# Patient Record
Sex: Male | Born: 1950
Health system: Southern US, Community
[De-identification: ages and names within clinical notes are randomized; demographics above are authoritative.]

## PROBLEM LIST (undated history)

## (undated) DIAGNOSIS — M419 Scoliosis, unspecified: Secondary | ICD-10-CM

## (undated) DIAGNOSIS — R7303 Prediabetes: Secondary | ICD-10-CM

## (undated) DIAGNOSIS — I10 Essential (primary) hypertension: Secondary | ICD-10-CM

## (undated) DIAGNOSIS — I219 Acute myocardial infarction, unspecified: Secondary | ICD-10-CM

## (undated) DIAGNOSIS — K635 Polyp of colon: Secondary | ICD-10-CM

## (undated) DIAGNOSIS — E785 Hyperlipidemia, unspecified: Secondary | ICD-10-CM

## (undated) DIAGNOSIS — M1990 Unspecified osteoarthritis, unspecified site: Secondary | ICD-10-CM

## (undated) DIAGNOSIS — K219 Gastro-esophageal reflux disease without esophagitis: Secondary | ICD-10-CM

## (undated) DIAGNOSIS — I2510 Atherosclerotic heart disease of native coronary artery without angina pectoris: Secondary | ICD-10-CM

## (undated) DIAGNOSIS — K625 Hemorrhage of anus and rectum: Secondary | ICD-10-CM

## (undated) HISTORY — DX: Polyp of colon: K63.5

## (undated) HISTORY — DX: Prediabetes: R73.03

## (undated) HISTORY — DX: Essential (primary) hypertension: I10

## (undated) HISTORY — DX: Hyperlipidemia, unspecified: E78.5

## (undated) HISTORY — PX: CARDIAC CATHETERIZATION: SHX172

## (undated) HISTORY — DX: Scoliosis, unspecified: M41.9

## (undated) HISTORY — DX: Hemorrhage of anus and rectum: K62.5

## (undated) HISTORY — DX: Acute myocardial infarction, unspecified: I21.9

## (undated) HISTORY — DX: Unspecified osteoarthritis, unspecified site: M19.90

## (undated) HISTORY — PX: COLONOSCOPY: SHX174

---

## 1988-01-04 HISTORY — PX: OTHER SURGICAL HISTORY: SHX169

## 1997-10-26 ENCOUNTER — Emergency Department (HOSPITAL_COMMUNITY): Admission: EM | Admit: 1997-10-26 | Discharge: 1997-10-26 | Payer: Self-pay | Admitting: Emergency Medicine

## 1998-08-28 ENCOUNTER — Emergency Department (HOSPITAL_COMMUNITY): Admission: EM | Admit: 1998-08-28 | Discharge: 1998-08-28 | Payer: Self-pay | Admitting: Emergency Medicine

## 1998-09-27 ENCOUNTER — Emergency Department (HOSPITAL_COMMUNITY): Admission: EM | Admit: 1998-09-27 | Discharge: 1998-09-27 | Payer: Self-pay | Admitting: Emergency Medicine

## 1999-07-05 ENCOUNTER — Emergency Department (HOSPITAL_COMMUNITY): Admission: EM | Admit: 1999-07-05 | Discharge: 1999-07-05 | Payer: Self-pay | Admitting: Emergency Medicine

## 1999-07-05 ENCOUNTER — Encounter: Payer: Self-pay | Admitting: Emergency Medicine

## 1999-09-02 ENCOUNTER — Encounter: Admission: RE | Admit: 1999-09-02 | Discharge: 1999-12-01 | Payer: Self-pay | Admitting: Family Medicine

## 1999-11-02 ENCOUNTER — Encounter: Admission: RE | Admit: 1999-11-02 | Discharge: 1999-11-02 | Payer: Self-pay | Admitting: Internal Medicine

## 2001-12-20 ENCOUNTER — Encounter: Payer: Self-pay | Admitting: Emergency Medicine

## 2001-12-20 ENCOUNTER — Emergency Department (HOSPITAL_COMMUNITY): Admission: EM | Admit: 2001-12-20 | Discharge: 2001-12-20 | Payer: Self-pay | Admitting: Emergency Medicine

## 2002-04-01 ENCOUNTER — Encounter: Payer: Self-pay | Admitting: Internal Medicine

## 2002-04-01 ENCOUNTER — Encounter: Admission: RE | Admit: 2002-04-01 | Discharge: 2002-04-01 | Payer: Self-pay | Admitting: Internal Medicine

## 2002-04-01 ENCOUNTER — Ambulatory Visit (HOSPITAL_COMMUNITY): Admission: RE | Admit: 2002-04-01 | Discharge: 2002-04-01 | Payer: Self-pay | Admitting: Internal Medicine

## 2002-04-02 ENCOUNTER — Encounter: Payer: Self-pay | Admitting: Emergency Medicine

## 2002-04-02 ENCOUNTER — Emergency Department (HOSPITAL_COMMUNITY): Admission: EM | Admit: 2002-04-02 | Discharge: 2002-04-02 | Payer: Self-pay | Admitting: Emergency Medicine

## 2003-04-03 ENCOUNTER — Encounter: Admission: RE | Admit: 2003-04-03 | Discharge: 2003-04-03 | Payer: Self-pay | Admitting: Internal Medicine

## 2003-04-11 ENCOUNTER — Encounter: Admission: RE | Admit: 2003-04-11 | Discharge: 2003-04-11 | Payer: Self-pay | Admitting: Internal Medicine

## 2003-06-03 ENCOUNTER — Encounter: Admission: RE | Admit: 2003-06-03 | Discharge: 2003-09-01 | Payer: Self-pay | Admitting: Internal Medicine

## 2003-10-01 ENCOUNTER — Emergency Department (HOSPITAL_COMMUNITY): Admission: EM | Admit: 2003-10-01 | Discharge: 2003-10-01 | Payer: Self-pay | Admitting: Emergency Medicine

## 2003-10-28 ENCOUNTER — Ambulatory Visit: Payer: Self-pay | Admitting: Internal Medicine

## 2004-01-24 ENCOUNTER — Emergency Department (HOSPITAL_COMMUNITY): Admission: EM | Admit: 2004-01-24 | Discharge: 2004-01-24 | Payer: Self-pay | Admitting: Emergency Medicine

## 2004-02-16 ENCOUNTER — Ambulatory Visit: Payer: Self-pay | Admitting: Internal Medicine

## 2004-03-19 ENCOUNTER — Ambulatory Visit (HOSPITAL_COMMUNITY): Admission: RE | Admit: 2004-03-19 | Discharge: 2004-03-19 | Payer: Self-pay | Admitting: *Deleted

## 2004-03-19 ENCOUNTER — Encounter (INDEPENDENT_AMBULATORY_CARE_PROVIDER_SITE_OTHER): Payer: Self-pay | Admitting: *Deleted

## 2005-03-16 ENCOUNTER — Ambulatory Visit: Payer: Self-pay | Admitting: Internal Medicine

## 2005-07-05 ENCOUNTER — Emergency Department (HOSPITAL_COMMUNITY): Admission: EM | Admit: 2005-07-05 | Discharge: 2005-07-05 | Payer: Self-pay | Admitting: Emergency Medicine

## 2006-01-05 DIAGNOSIS — E785 Hyperlipidemia, unspecified: Secondary | ICD-10-CM | POA: Insufficient documentation

## 2006-01-05 DIAGNOSIS — D126 Benign neoplasm of colon, unspecified: Secondary | ICD-10-CM | POA: Insufficient documentation

## 2006-02-17 ENCOUNTER — Telehealth (INDEPENDENT_AMBULATORY_CARE_PROVIDER_SITE_OTHER): Payer: Self-pay | Admitting: Internal Medicine

## 2006-08-22 ENCOUNTER — Emergency Department (HOSPITAL_COMMUNITY): Admission: EM | Admit: 2006-08-22 | Discharge: 2006-08-22 | Payer: Self-pay | Admitting: Emergency Medicine

## 2006-08-28 ENCOUNTER — Ambulatory Visit: Payer: Self-pay | Admitting: Internal Medicine

## 2006-09-20 ENCOUNTER — Ambulatory Visit: Payer: Self-pay | Admitting: Internal Medicine

## 2006-09-20 ENCOUNTER — Encounter (INDEPENDENT_AMBULATORY_CARE_PROVIDER_SITE_OTHER): Payer: Self-pay | Admitting: Internal Medicine

## 2006-09-20 LAB — CONVERTED CEMR LAB
AST: 14 units/L (ref 0–37)
Alkaline Phosphatase: 81 units/L (ref 39–117)
BUN: 16 mg/dL (ref 6–23)
Creatinine, Ser: 1.29 mg/dL (ref 0.40–1.50)
Glucose, Bld: 87 mg/dL (ref 70–99)
HCT: 43.2 % (ref 39.0–52.0)
Hemoglobin: 15 g/dL (ref 13.0–17.0)
MCHC: 34.7 g/dL (ref 30.0–36.0)
MCV: 83.4 fL (ref 78.0–100.0)
RDW: 13.2 % (ref 11.5–14.0)

## 2006-12-15 ENCOUNTER — Encounter (INDEPENDENT_AMBULATORY_CARE_PROVIDER_SITE_OTHER): Payer: Self-pay | Admitting: *Deleted

## 2006-12-15 ENCOUNTER — Ambulatory Visit: Payer: Self-pay | Admitting: Internal Medicine

## 2006-12-18 LAB — CONVERTED CEMR LAB
ALT: 14 units/L (ref 0–53)
Albumin: 4.6 g/dL (ref 3.5–5.2)
BUN: 14 mg/dL (ref 6–23)
CO2: 27 meq/L (ref 19–32)
Calcium: 9.7 mg/dL (ref 8.4–10.5)
Chloride: 106 meq/L (ref 96–112)
Cholesterol: 233 mg/dL — ABNORMAL HIGH (ref 0–200)
Creatinine, Ser: 1.3 mg/dL (ref 0.40–1.50)
HDL: 42 mg/dL (ref >39)
PSA: 0.66 ng/mL (ref 0.10–4.00)
Potassium: 4.4 meq/L (ref 3.5–5.3)
Total CHOL/HDL Ratio: 5.5

## 2007-01-06 ENCOUNTER — Telehealth (INDEPENDENT_AMBULATORY_CARE_PROVIDER_SITE_OTHER): Payer: Self-pay | Admitting: *Deleted

## 2007-01-08 ENCOUNTER — Ambulatory Visit: Payer: Self-pay | Admitting: Internal Medicine

## 2007-02-19 ENCOUNTER — Encounter (INDEPENDENT_AMBULATORY_CARE_PROVIDER_SITE_OTHER): Payer: Self-pay | Admitting: *Deleted

## 2007-02-19 ENCOUNTER — Ambulatory Visit: Payer: Self-pay | Admitting: Hospitalist

## 2007-02-23 LAB — CONVERTED CEMR LAB
ALT: 14 U/L
AST: 13 U/L
Albumin: 4.7 g/dL
Alkaline Phosphatase: 79 U/L
BUN: 12 mg/dL
CO2: 27 meq/L
Calcium: 10.2 mg/dL
Chloride: 106 meq/L
Cholesterol: 154 mg/dL
Creatinine, Ser: 1.19 mg/dL
Glucose, Bld: 110 mg/dL — ABNORMAL HIGH
HDL: 36 mg/dL — ABNORMAL LOW
LDL Cholesterol: 107 mg/dL — ABNORMAL HIGH
Potassium: 4.7 meq/L
Sodium: 143 meq/L
Total Bilirubin: 0.8 mg/dL
Total CHOL/HDL Ratio: 4.3
Total Protein: 7.8 g/dL
Triglycerides: 54 mg/dL
VLDL: 11 mg/dL

## 2007-02-27 ENCOUNTER — Ambulatory Visit: Payer: Self-pay | Admitting: Internal Medicine

## 2007-02-27 ENCOUNTER — Encounter (INDEPENDENT_AMBULATORY_CARE_PROVIDER_SITE_OTHER): Payer: Self-pay | Admitting: *Deleted

## 2007-02-28 ENCOUNTER — Telehealth (INDEPENDENT_AMBULATORY_CARE_PROVIDER_SITE_OTHER): Payer: Self-pay | Admitting: *Deleted

## 2007-03-01 ENCOUNTER — Telehealth: Payer: Self-pay | Admitting: *Deleted

## 2007-03-01 ENCOUNTER — Telehealth (INDEPENDENT_AMBULATORY_CARE_PROVIDER_SITE_OTHER): Payer: Self-pay | Admitting: *Deleted

## 2007-03-06 ENCOUNTER — Telehealth (INDEPENDENT_AMBULATORY_CARE_PROVIDER_SITE_OTHER): Payer: Self-pay | Admitting: *Deleted

## 2007-03-13 ENCOUNTER — Telehealth (INDEPENDENT_AMBULATORY_CARE_PROVIDER_SITE_OTHER): Payer: Self-pay | Admitting: *Deleted

## 2007-03-19 ENCOUNTER — Telehealth (INDEPENDENT_AMBULATORY_CARE_PROVIDER_SITE_OTHER): Payer: Self-pay | Admitting: *Deleted

## 2007-03-20 ENCOUNTER — Ambulatory Visit: Payer: Self-pay | Admitting: Infectious Diseases

## 2007-03-21 ENCOUNTER — Encounter (INDEPENDENT_AMBULATORY_CARE_PROVIDER_SITE_OTHER): Payer: Self-pay | Admitting: Internal Medicine

## 2007-03-21 LAB — CONVERTED CEMR LAB
BUN: 19 mg/dL (ref 6–23)
CO2: 26 meq/L (ref 19–32)
Chloride: 107 meq/L (ref 96–112)
Glucose, Bld: 88 mg/dL (ref 70–99)
Potassium: 4.2 meq/L (ref 3.5–5.3)
Sodium: 145 meq/L (ref 135–145)

## 2007-03-23 ENCOUNTER — Telehealth (INDEPENDENT_AMBULATORY_CARE_PROVIDER_SITE_OTHER): Payer: Self-pay | Admitting: *Deleted

## 2007-04-25 ENCOUNTER — Telehealth (INDEPENDENT_AMBULATORY_CARE_PROVIDER_SITE_OTHER): Payer: Self-pay | Admitting: *Deleted

## 2007-05-14 ENCOUNTER — Ambulatory Visit: Payer: Self-pay | Admitting: Internal Medicine

## 2007-06-08 ENCOUNTER — Telehealth (INDEPENDENT_AMBULATORY_CARE_PROVIDER_SITE_OTHER): Payer: Self-pay | Admitting: *Deleted

## 2007-06-08 ENCOUNTER — Ambulatory Visit: Payer: Self-pay | Admitting: Internal Medicine

## 2007-06-08 ENCOUNTER — Encounter (INDEPENDENT_AMBULATORY_CARE_PROVIDER_SITE_OTHER): Payer: Self-pay | Admitting: *Deleted

## 2007-06-08 LAB — CONVERTED CEMR LAB
Bilirubin Urine: NEGATIVE
Hemoglobin, Urine: NEGATIVE
Ketones, ur: NEGATIVE mg/dL
Leukocytes, UA: NEGATIVE
Protein, ur: NEGATIVE mg/dL
Urine Glucose: NEGATIVE mg/dL
pH: 5.5 (ref 5.0–8.0)

## 2007-07-11 ENCOUNTER — Telehealth (INDEPENDENT_AMBULATORY_CARE_PROVIDER_SITE_OTHER): Payer: Self-pay | Admitting: *Deleted

## 2007-07-23 ENCOUNTER — Ambulatory Visit: Payer: Self-pay | Admitting: *Deleted

## 2007-08-08 ENCOUNTER — Ambulatory Visit: Payer: Self-pay | Admitting: Gastroenterology

## 2007-08-16 ENCOUNTER — Telehealth: Payer: Self-pay | Admitting: Gastroenterology

## 2007-08-17 ENCOUNTER — Telehealth: Payer: Self-pay | Admitting: Gastroenterology

## 2007-08-22 ENCOUNTER — Ambulatory Visit (HOSPITAL_COMMUNITY): Admission: RE | Admit: 2007-08-22 | Discharge: 2007-08-22 | Payer: Self-pay | Admitting: Gastroenterology

## 2007-08-22 ENCOUNTER — Encounter: Payer: Self-pay | Admitting: Gastroenterology

## 2007-08-22 ENCOUNTER — Ambulatory Visit: Payer: Self-pay | Admitting: Gastroenterology

## 2007-08-23 ENCOUNTER — Encounter: Payer: Self-pay | Admitting: Gastroenterology

## 2007-08-26 ENCOUNTER — Telehealth (INDEPENDENT_AMBULATORY_CARE_PROVIDER_SITE_OTHER): Payer: Self-pay | Admitting: Internal Medicine

## 2007-08-27 ENCOUNTER — Encounter: Payer: Self-pay | Admitting: Gastroenterology

## 2007-08-30 ENCOUNTER — Ambulatory Visit: Payer: Self-pay | Admitting: Internal Medicine

## 2007-09-27 ENCOUNTER — Ambulatory Visit: Payer: Self-pay | Admitting: Internal Medicine

## 2007-11-09 ENCOUNTER — Telehealth (INDEPENDENT_AMBULATORY_CARE_PROVIDER_SITE_OTHER): Payer: Self-pay | Admitting: *Deleted

## 2007-11-16 ENCOUNTER — Ambulatory Visit: Payer: Self-pay | Admitting: Internal Medicine

## 2007-12-10 ENCOUNTER — Encounter (INDEPENDENT_AMBULATORY_CARE_PROVIDER_SITE_OTHER): Payer: Self-pay | Admitting: Adult Health

## 2007-12-10 ENCOUNTER — Ambulatory Visit: Payer: Self-pay | Admitting: Internal Medicine

## 2007-12-10 LAB — CONVERTED CEMR LAB
AST: 13 units/L (ref 0–37)
BUN: 15 mg/dL (ref 6–23)
Basophils Relative: 0 % (ref 0–1)
CO2: 25 meq/L (ref 19–32)
Chloride: 101 meq/L (ref 96–112)
Creatinine, Ser: 1.3 mg/dL (ref 0.40–1.50)
Hemoglobin: 15.3 g/dL (ref 13.0–17.0)
LDL Cholesterol: 134 mg/dL — ABNORMAL HIGH (ref 0–99)
MCHC: 34.5 g/dL (ref 30.0–36.0)
Monocytes Absolute: 0.3 10*3/uL (ref 0.1–1.0)
Monocytes Relative: 5 % (ref 3–12)
Neutro Abs: 2.4 10*3/uL (ref 1.7–7.7)
PSA: 0.58 ng/mL (ref 0.10–4.00)
RBC: 5.24 M/uL (ref 4.22–5.81)
Triglycerides: 79 mg/dL (ref <150)
VLDL: 16 mg/dL (ref 0–40)

## 2007-12-12 ENCOUNTER — Encounter (INDEPENDENT_AMBULATORY_CARE_PROVIDER_SITE_OTHER): Payer: Self-pay | Admitting: Adult Health

## 2007-12-12 LAB — CONVERTED CEMR LAB: Hgb A1c MFr Bld: 6 % (ref 4.6–6.1)

## 2007-12-21 ENCOUNTER — Telehealth (INDEPENDENT_AMBULATORY_CARE_PROVIDER_SITE_OTHER): Payer: Self-pay | Admitting: *Deleted

## 2008-01-22 ENCOUNTER — Ambulatory Visit: Payer: Self-pay | Admitting: Internal Medicine

## 2008-01-24 ENCOUNTER — Telehealth: Payer: Self-pay | Admitting: Infectious Disease

## 2008-02-16 ENCOUNTER — Telehealth (INDEPENDENT_AMBULATORY_CARE_PROVIDER_SITE_OTHER): Payer: Self-pay | Admitting: Internal Medicine

## 2008-02-28 ENCOUNTER — Ambulatory Visit: Payer: Self-pay | Admitting: Internal Medicine

## 2008-03-12 ENCOUNTER — Encounter (INDEPENDENT_AMBULATORY_CARE_PROVIDER_SITE_OTHER): Payer: Self-pay | Admitting: Internal Medicine

## 2008-03-12 ENCOUNTER — Telehealth (INDEPENDENT_AMBULATORY_CARE_PROVIDER_SITE_OTHER): Payer: Self-pay | Admitting: *Deleted

## 2008-03-12 ENCOUNTER — Ambulatory Visit: Payer: Self-pay | Admitting: *Deleted

## 2008-03-13 LAB — CONVERTED CEMR LAB
Albumin: 4.4 g/dL (ref 3.5–5.2)
Alkaline Phosphatase: 63 units/L (ref 39–117)
BUN: 13 mg/dL (ref 6–23)
Calcium: 9.7 mg/dL (ref 8.4–10.5)
Chloride: 107 meq/L (ref 96–112)
Glucose, Bld: 104 mg/dL — ABNORMAL HIGH (ref 70–99)
LDL Cholesterol: 109 mg/dL — ABNORMAL HIGH (ref 0–99)
Potassium: 4.3 meq/L (ref 3.5–5.3)
Sodium: 143 meq/L (ref 135–145)
Total Protein: 7.2 g/dL (ref 6.0–8.3)
Triglycerides: 59 mg/dL (ref <150)

## 2008-06-06 ENCOUNTER — Encounter (INDEPENDENT_AMBULATORY_CARE_PROVIDER_SITE_OTHER): Payer: Self-pay | Admitting: *Deleted

## 2008-06-06 ENCOUNTER — Ambulatory Visit: Payer: Self-pay | Admitting: Internal Medicine

## 2008-06-06 LAB — CONVERTED CEMR LAB: Blood Glucose, AC Bkfst: 101 mg/dL

## 2008-06-08 LAB — CONVERTED CEMR LAB
ALT: 19 units/L (ref 0–53)
AST: 16 units/L (ref 0–37)
Albumin: 4.5 g/dL (ref 3.5–5.2)
BUN: 14 mg/dL (ref 6–23)
CO2: 27 meq/L (ref 19–32)
Calcium: 9.7 mg/dL (ref 8.4–10.5)
Chloride: 107 meq/L (ref 96–112)
GFR calc Af Amer: >60 mL/min (ref >60)
LDL Cholesterol: 106 mg/dL — ABNORMAL HIGH (ref 0–99)
Potassium: 5 meq/L (ref 3.5–5.3)
VLDL: 11 mg/dL (ref 0–40)

## 2008-06-23 ENCOUNTER — Encounter: Payer: Self-pay | Admitting: Internal Medicine

## 2008-08-06 ENCOUNTER — Telehealth: Payer: Self-pay | Admitting: *Deleted

## 2008-08-07 ENCOUNTER — Ambulatory Visit: Payer: Self-pay | Admitting: Internal Medicine

## 2008-08-07 LAB — CONVERTED CEMR LAB
Chloride: 105 meq/L (ref 96–112)
Potassium: 4.1 meq/L (ref 3.5–5.3)

## 2008-10-14 ENCOUNTER — Telehealth (INDEPENDENT_AMBULATORY_CARE_PROVIDER_SITE_OTHER): Payer: Self-pay | Admitting: *Deleted

## 2008-12-03 ENCOUNTER — Ambulatory Visit: Payer: Self-pay | Admitting: Internal Medicine

## 2009-05-21 ENCOUNTER — Ambulatory Visit: Payer: Self-pay | Admitting: Internal Medicine

## 2009-05-21 LAB — CONVERTED CEMR LAB
Cholesterol: 172 mg/dL (ref 0–200)
HDL: 48 mg/dL (ref >39)
Total CHOL/HDL Ratio: 3.6
Triglycerides: 79 mg/dL (ref <150)

## 2009-07-22 ENCOUNTER — Telehealth: Payer: Self-pay | Admitting: Internal Medicine

## 2009-07-23 ENCOUNTER — Ambulatory Visit: Payer: Self-pay | Admitting: Internal Medicine

## 2009-07-23 LAB — CONVERTED CEMR LAB
Albumin: 4.5 g/dL (ref 3.5–5.2)
Alkaline Phosphatase: 72 units/L (ref 39–117)
BUN: 11 mg/dL (ref 6–23)
Creatinine, Ser: 1.2 mg/dL (ref 0.40–1.50)
Glucose, Bld: 98 mg/dL (ref 70–99)
Total Bilirubin: 0.7 mg/dL (ref 0.3–1.2)

## 2009-10-08 ENCOUNTER — Telehealth (INDEPENDENT_AMBULATORY_CARE_PROVIDER_SITE_OTHER): Payer: Self-pay | Admitting: *Deleted

## 2009-11-23 ENCOUNTER — Ambulatory Visit: Payer: Self-pay | Admitting: Internal Medicine

## 2009-11-23 ENCOUNTER — Observation Stay (HOSPITAL_COMMUNITY)
Admission: AD | Admit: 2009-11-23 | Discharge: 2009-11-25 | Payer: Self-pay | Source: Home / Self Care | Admitting: Infectious Diseases

## 2009-11-23 ENCOUNTER — Ambulatory Visit (HOSPITAL_COMMUNITY): Admission: RE | Admit: 2009-11-23 | Discharge: 2009-11-23 | Payer: Self-pay | Admitting: Internal Medicine

## 2009-11-23 ENCOUNTER — Telehealth: Payer: Self-pay | Admitting: *Deleted

## 2009-11-24 ENCOUNTER — Encounter: Payer: Self-pay | Admitting: Internal Medicine

## 2009-11-25 ENCOUNTER — Encounter: Payer: Self-pay | Admitting: Internal Medicine

## 2009-11-26 ENCOUNTER — Encounter: Payer: Self-pay | Admitting: Internal Medicine

## 2009-11-29 ENCOUNTER — Telehealth: Payer: Self-pay | Admitting: Internal Medicine

## 2009-12-07 ENCOUNTER — Ambulatory Visit: Payer: Self-pay | Admitting: Internal Medicine

## 2009-12-07 DIAGNOSIS — I10 Essential (primary) hypertension: Secondary | ICD-10-CM | POA: Insufficient documentation

## 2009-12-31 ENCOUNTER — Telehealth: Payer: Self-pay | Admitting: Internal Medicine

## 2010-02-02 NOTE — Miscellaneous (Signed)
  Clinical Lists Changes Pt couldnt pay for Augmentin, will send script for Amoxicillin.  Medications: Changed medication from AUGMENTIN 875-125 MG TABS (AMOXICILLIN-POT CLAVULANATE) 1 tablet by mouth two times a day x 14 days to AMOXICILLIN 500 MG CAPS (AMOXICILLIN) Take 1 tablet by mouth three times a day - Signed Rx of AMOXICILLIN 500 MG CAPS (AMOXICILLIN) Take 1 tablet by mouth three times a day;  #30 x 0;  Signed;  Entered by: Darnelle Maffucci MD;  Authorized by: Darnelle Maffucci MD;  Method used: Electronically to [facility] [address], [place], [place], [place]  [address], Ph: [phone], Fax: [phone]    Prescriptions: AMOXICILLIN 500 MG CAPS (AMOXICILLIN) Take 1 tablet by mouth three times a day  #30 x 0   Entered and Authorized by:   Darnelle Maffucci MD   Signed by:   Darnelle Maffucci MD on 11/25/2009   Method used:   Electronically to        [facility] (retail)       [address]       [place], [place]  [address]       Ph: [phone]       Fax: [phone]   RxID:   [id number]
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
## 2010-02-02 NOTE — Progress Notes (Signed)
Summary: phone/gg  Phone Note Call from Patient   Caller: Patient Summary of Call: Pt c/o back pain with radiation to chest.  hurts when he moves.  It starts  in middle part of back. Onset 2 days ago, constant.  today increase pain with movement.  Rates pain 9/10.   Feels like a cramp.   know injury, no recent  lifting. He has not taken any med for pain relief. Denies SOB, nausea, diaphoresis. Please advise, pt # [id number] Initial call taken by: Merrie Roof RN,  October 08, 2009 10:12 AM  Follow-up for Phone Call        Called pt twice and got voice mail both times.  Left message to call clinic and ask for [facility].  If calls, please transfer call to me.  Thanks.  Follow-up by: Mariea Stable MD,  October 08, 2009 10:41 AM  Additional Follow-up for Phone Call Additional follow up Details #1::        Spoke with patient.  Pain is described as a "squeeze/catching" with movement.  No pain while still.  Wraps all the way around.  No lower extremity weakness, numbnes or difficulty with urinating/BMs.  Denies any other symptoms such as chest pain, sob, cough, fever, diaphoresis, lightheadedness, etc.  Advised on advil, schedule appointment for eval and if worsening to go to [facility]. Additional Follow-up by: Mariea Stable MD,  October 08, 2009 12:32 PM    Additional Follow-up for Phone Call Additional follow up Details #2::    Called pt and wants to try advil before he makes appointment.  He will call if not better. Follow-up by: Merrie Roof RN,  October 08, 2009 4:08 PM   Appended Document: phone/gg pt calls and states the pain is no better w/ advil, he is advised to go to urg care and states he will go asap. he is advised to call 911 if needed

## 2010-02-02 NOTE — Assessment & Plan Note (Signed)
Summary: heaadache/gg   Vital Signs:  Patient profile:   60 year old male Height:      67 inches (170.18 cm) Weight:      238.2 pounds BMI:     29.08 Temp:     100.3 degrees F (37.94 degrees C) oral Pulse rate:   95 / minute BP sitting:   146 / 93  (left arm) Cuff size:   REGUALR  Vitals Entered By: Theotis Barrio NT II (November 23, 2009 11:12 AM) CC: PATIENT IS A ADD ON / HEADACHE  Pain Assessment Patient in pain? no      Nutritional Status BMI of 25 - 29 = overweight  Have you ever been in a relationship where you felt threatened, hurt or afraid?No   Does patient need assistance? Functional Status Self care Ambulation Normal   Primary Care Provider:  Lars Mage MD  CC:  PATIENT IS A ADD ON / HEADACHE .  History of Present Illness: This is a 60 year old male  with a history of hyperlipidemia who presents with a right sided headache which has been constant since Saturday.  The pain is discribed as a 10/10 and is vice like.  Pt sates that this is worst headahce he has ever had though he has a history of mild headaches in the past.  The patient feels that his face is slightly swolen on the right side and pt has noted some watering of his right eye.  Mr. Ehle does have mild photophobia  and nausea assoicated with one episode of vomiting yesterday, but pt denies any phonophobia, slurred speach, dizzyness, difficulty walking weaknes or numbness.  Pt can't sleep because of the headache.The patient's symoms are worsened by leaning forward. Pt has had subjective fevers, and temp was 100.3 in the office today.     Pt has also had cough since Saturday, with brown phylym. Has had some mild sob when with exertion.  No CP or hempotpysis.   Pt denies any chills, weight chainge, change in his BM's or change is vision.    Preventive Screening-Counseling & Management  Alcohol-Tobacco     Alcohol drinks/day: 0     Smoking Status: quit     Year Quit: 15+ YEARS  AGO  Caffeine-Diet-Exercise     Does Patient Exercise: yes     Type of exercise: WALKING     Times/week: 1-2  Allergies: No Known Drug Allergies  Past History:  Past Medical History: Last updated: 02/28/2008 Hyperlipidemia History of rectal bleeding Colonic polyps, 3/06  and 2009; needs repeat in 3 yrs (3/12) Sexual partner with HIV Hypertension Pre-diabetes with Fasting glucose of 110 (2/09)  Family History: Reviewed history from 01/08/2007 and no changes required. Unknown.   Social History: Reviewed history from 01/08/2007 and no changes required. Lives with brother in [place].  Pt endorses a homosexual relationship with HIV+ partner.  Pt aware of risks but says condoms are always used for intercourse.  Review of Systems       Negative as per HPI.   Physical Exam  General:  alert, well-developed, and in mild distress.   Head:  normocephalic, atraumatic, there is significant tenderness over the maxilary sinus of the right side and mild tenderness over the right frontal sinus.  Eyes:  vision grossly intact, pupils equal, pupils round, and pupils reactive to light.  The right eye was tearing spontaniously.  Ears:  R ear normal and L ear normal.   Nose:  Moderate boggyness of the turbinates  bilaterally.  Mouth:  pharynx pink and moist.   Neck:  supple and full ROM.   Lungs:  normal respiratory effort, normal breath sounds, no crackles, and no wheezes.   Heart:  normal rate, regular rhythm, no murmur, no gallop, and no rub.   Abdomen:  soft, non-tender, normal bowel sounds, no distention, and no masses.   Msk:  normal ROM.   Pulses:  2+ dp/pt pulses bilaterally. Extremities:  No edema  Neurologic:  alert & oriented X3, cranial nerves II-XII intact, strength normal in all extremities, and sensation intact to light touch.  Negative Kernig and brudinski signs.  Skin:  No rashes.    Impression & Recommendations:  Problem # 1:  HEADACHE (ICD-784.0) Given the high  probabilty of sinusitis and severe headache of the patient, a sinus CT scan was performed with non-contrasted CT of the brain to rule out intracranial or orbital extension.  CT demonstrated no apparent intracranial extension but did show extensive sinusitis with diffuse mucosal thickening throughout the sinuses.  Given the extensive mucosal changes, fever, and severe headache, the patient will be admitted for consideration of IV antibiotics, pain control and decongestant administration.  For now, the patient was started on amoxicillin though this could be broadened to augmentin should this be deemed necessary.  The patient was instructed to use an OTC decongestant such as afarin upon discharge.  His updated medication list for this problem includes:    Anacin 81 Mg Tbec (Aspirin) .Marland Kitchen... Take 1 tablet by mouth once a day  Orders: CT without Contrast (CT w/o contrast)  Problem # 2:  COUGH (ICD-786.2) Pt has cough productive of brown suputum which could represent pneumonia vs. post nasal drip.  Given the patients SOB on exertion since Saturday, I will checked a chest x-ray to rule out pneumonia which demonstrated no acute findings.   Complete Medication List: 1)  Pravachol 40 Mg Tabs (Pravastatin sodium) .... Take 1 tablet by mouth once a day 2)  Anacin 81 Mg Tbec (Aspirin) .... Take 1 tablet by mouth once a day 3)  Amoxicillin 500 Mg Caps (Amoxicillin) .... Take 1 tablet by mouth three times a day for 10 days.  Other Orders: CXR- 2view (CXR)  Patient Instructions: 1)  Please schedule an appointment for follow up in 1 week. 2)  I will order a CT scan of your head and sinuses to evaluate your sinusitis. 3)  I will check a chest x-ray given your recent cough. 4)  If you do not improve on the antibiotic, or have any neurologic symptoms please call the clinic or go to the ED.  Prescriptions: AMOXICILLIN 500 MG CAPS (AMOXICILLIN) Take 1 tablet by mouth three times a day for 10 days.  #30 x 0    Entered and Authorized by:   Sinda Du MD   Signed by:   Sinda Du MD on 11/23/2009   Method used:   Print then Give to Patient   RxID:   [id number]    Medication Administration  Medication # 1:    Medication: Amoxicillin    Diagnosis: HEADACHE (ICD-784.0)    Dose: 500mg     Route: po    Exp Date: 11/02/2011    Lot #: [id number]    Mfr: Sandoz    Comments: Ordered by Dr. Cornelius Moras.    Patient tolerated medication without complications    Given by: Chinita Pester RN (November 23, 2009 12:37 PM)  Orders Added: 1)  CXR- 2view [CXR] 2)  Est. Patient Level  III [id number] 3)  CT without Contrast [CT w/o contrast]    Prevention & Chronic Care Immunizations   Influenza vaccine: Not documented   Influenza vaccine deferral: Not indicated  (05/21/2009)    Tetanus booster: Not documented   Td booster deferral: Not indicated  (05/21/2009)    Pneumococcal vaccine: Not documented  Colorectal Screening   Hemoccult: Not documented   Hemoccult action/deferral: Not indicated  (12/03/2008)    Colonoscopy: Location:  [facility].    (08/22/2007)   Colonoscopy action/deferral: Repeat colonoscopy in 3 years.     (03/09/2004)   Colonoscopy due: 09/2010  Other Screening   PSA: 0.66  (12/15/2006)   PSA action/deferral: Discussed-PSA declined  (05/21/2009)   Smoking status: quit  (11/23/2009)  Lipids   Total Cholesterol: 172  (05/21/2009)   LDL: 108  (05/21/2009)   LDL Direct: Not documented   HDL: 48  (05/21/2009)   Triglycerides: 79  (05/21/2009)    SGOT (AST): 17  (07/23/2009)   SGPT (ALT): 14  (07/23/2009)   Alkaline phosphatase: 72  (07/23/2009)   Total bilirubin: 0.7  (07/23/2009)  Self-Management Support :   Personal Goals (by the next clinic visit) :      Personal LDL goal: 130  (12/03/2008)    Lipid self-management support: Pre-printed educational material  (07/23/2009)     Medication Administration  Medication # 1:    Medication: Amoxicillin     Diagnosis: HEADACHE (ICD-784.0)    Dose: 500mg     Route: po    Exp Date: 11/02/2011    Lot #: [id number]    Mfr: Sandoz    Comments: Ordered by Dr. Cornelius Moras.    Patient tolerated medication without complications    Given by: Chinita Pester RN (November 23, 2009 12:37 PM)  Orders Added: 1)  CXR- 2view [CXR] 2)  Est. Patient Level III [04540] 3)  CT without Contrast [CT w/o contrast]

## 2010-02-02 NOTE — Miscellaneous (Signed)
Summary: phone call  Patient called. Reports Right LB pain since a hospital discharge yesterday. Reports as dull, achy, above rith buttock, without any radiculopathy, 4/10 in intensity; no fever, chills, bowel or bladder incontinence. Pain is worse with bearing weight and alleviated with rest. Instructed to apply low heat for 15 min three times a day-qid on as needed basis and take Tylenol OTC.Patient is instructed to follow up with his PCP, Dr. Eben Burow on Monday 11/30/09.

## 2010-02-02 NOTE — Miscellaneous (Signed)
Summary: Hospital Admission...  INTERNAL MEDICINE ADMISSION HISTORY AND PHYSICAL Attending: Dr. Lina Sayre 1st contact Dr HO [phone]   2nd contact Dr Gilford Rile  [phone]  PCP: Dr. Lars Mage  Holidays or 5pm on weekdays: 1st contact [phone] 2nd contact [phone]  CC: Headache, R facial pain, cough.   HPI: Pt is a 60 year old male  with PMH of HTN,hyperlipidemia, who presented in the clinic today morning with a right sided headache which has been constant since Saturday.  The pain is discribed as a 10/10 and is vice like.  Pt sates that this is worst headahce he has ever had though he has a history of mild headaches in the past.  The patient feels that his face is slightly swolen on the right side and pt has noted some watering of his right eye.   Mr. Cull does have mild photophobia  and nausea assoicated with one episode of vomiting yesterday, but he denies any phonophobia, slurred speach, dizziness, difficulty walking weaknes or numbness.  Pt can't sleep because of the headache. The patient's symptoms are worsened by leaning forward.  Pt has had subjective fevers, and temp was 100.3 in the clinic today.     Pt has also had cough since Saturday, with brown phlegm. Has had some mild sob when with exertion.  No CP or hempotpysis.   Pt denies any chills, weight chainge, change in his BM's, or any sick contacts.   ALLERGIES: NKDA  PAST MEDICAL HISTORY: Hyperlipidemia History of rectal bleeding Colonic polyps, 3/06  and 2009; needs repeat in 3 yrs (3/12) Sexual partner with HIV Hypertension Pre-diabetes with Fasting glucose of 110 (2/09)   MEDICATIONS: PRAVACHOL 40 MG  TABS (PRAVASTATIN SODIUM) Take 1 tablet by mouth once a day ANACIN 81 MG  TBEC (ASPIRIN) Take 1 tablet by mouth once a day AMOXICILLIN 500 MG CAPS (AMOXICILLIN) Take 1 tablet by mouth three times a day for 10 days.   SOCIAL HISTORY: Lives with brother in [place].  Pt had a homosexual relationship with HIV+  partner.  Pt was aware of risks and always used condoms for intercourse.   FAMILY HISTORY Non-contributory.  ROS: As per HPI, all other systems reviewed and negative  VITALS: T: 100.3   P: 95    BP: 146/93   R:16  O2SAT: 99% ON:RA  PHYSICAL EXAM: General:  alert, well-developed, and in mild distress.   Head:  normocephalic, atraumatic, there is significant tenderness over the maxilary sinus of the right side and mild tenderness over the right frontal sinus.  Eyes:  vision grossly intact, pupils equal, pupils round, and pupils reactive to light.  The right eye was tearing spontaniously.  Ears:  R ear normal and L ear normal.   Nose:  Moderate boggyness of the turbinates bilaterally.  Mouth:  pharynx pink and moist.   Neck:  supple and full ROM.   Lungs:  normal respiratory effort, normal breath sounds, no crackles, and no wheezes.   Heart:  normal rate, regular rhythm, no murmur, no gallop, and no rub.   Abdomen:  soft, non-tender, normal bowel sounds, no distention, and no masses.   Msk:  normal ROM.   Pulses:  2+ dp/pt pulses bilaterally. Extremities:  No edema  Neurologic:  alert & oriented X3, cranial nerves II-XII intact, strength normal in all extremities, and sensation intact to light touch.  Negative Kernig and brudinski signs.  Skin:  No rashes.   LABS: CBC:   WBC  10.0              4.0-10.5         K/uL  RBC                                      4.93              4.22-5.81        MIL/uL  Hemoglobin (HGB)                         14.6              13.0-17.0        g/dL  Hematocrit (HCT)                         40.2              39.0-52.0        %  MCV                                      81.5              78.0-100.0       fL  MCH -                                    29.6              26.0-34.0        pg  MCHC                                     36.3       h      30.0-36.0        g/dL  RDW                                      12.8               11.5-15.5        %  Platelet Count (PLT)                     243               150-400          K/uL  BMET:  Sodium (NA)                              137               135-145          mEq/L  Potassium (K)                            3.5               3.5-5.1          mEq/L  Chloride  101               96-112           mEq/L  CO2                                      25                19-32            mEq/L  Glucose                                  120        h      70-99            mg/dL  BUN                                      11                6-23             mg/dL  Creatinine                               1.04              0.4-1.5          mg/dL  GFR, Est Non African American            >60               >60              mL/min  GFR, Est African American                >60               >60              mL/min  Calcium                                  9.6               8.4-10.5         mg/dL  U/A:  Color, Urine                             YELLOW            YELLOW  Appearance                               CLOUDY     a      CLEAR  Specific Gravity                         1.013             1.005-1.030  pH  5.5               5.0-8.0  Urine Glucose                            NEGATIVE          NEG              mg/dL  Bilirubin                                NEGATIVE          NEG  Ketones                                  15         a      NEG              mg/dL  Blood                                    NEGATIVE          NEG  Protein                                  NEGATIVE          NEG              mg/dL  Urobilinogen                             1.0               0.0-1.0          mg/dL  Nitrite                                  NEGATIVE          NEG  Leukocytes                               NEGATIVE          NEG  Lipd Profile:  Cholesterol                              147               0-200            mg/dL    ATP III  CLASSIFICATION:    <200     mg/dL   Desirable    621-308  mg/dL   Borderline High    >=657    mg/dL   High  Triglyceride-LIPR                        61                <150             mg/dL  Cholesterol, HDL  34         l      >39              mg/dL  Cholesterol, LDL                         101        h      0-99             mg/dL  Cholesterol, VLDL                        12                0-40             mg/dL  Total CHOL/HDL Ratio                     4.3                                RATIO   UDS:  Amphetamins                              SEE NOTE.         NDT    NONE DETECTED  Barbiturates                             SEE NOTE.         NDT    Oversized comment, see footnote  1  Benzodiazepines                          SEE NOTE.         NDT    NONE DETECTED  Cocaine                                  SEE NOTE.         NDT    NONE DETECTED  Opiates                                  SEE NOTE.         NDT    NONE DETECTED  Tetrahydrocannabinol                     SEE NOTE.         NDT    NONE DETECTED    Thyroid Stimulating Hormone (TSH)        1.055             0.350-4.500      uIU/mL   Hemoglobin A1C                           6.0        h      4.6-6.1          %  Mean Plasma Glucose                      126  h                       mg/dL  CXR:   Findings: Lungs clear.  No pneumothorax or pleural effusion.  Heart   size normal.  CT LIMITED SINUSES WITHOUT CONTRAST:   IMPRESSION:   Acute and chronic sinusitis.  CT HEAD WITHOUT CONTRAST:  IMPRESSION:   No significant abnormality the brain.  MRI with contrast is more   sensitive than CT to evaluate for intracranial extension of   infection.   ASSESSMENT AND PLAN: 1) Headache, facial pain and cough: As per the Hx, PMH and exam, pt has the constellation of Sxs most likely due to Acute Rhinosinusitis ( Viral vs. Bacterial ).  CT head and sinuses supports the Dx and also ruled out any Intracranial  spread of infection. Also CXR didn't show any lung parenchymal infections. - Will admit pt to regular bed for Observation. - Will start him on IV Cefuroxime for possible Acute Bacterial sinusitis. - Will treat his headache and maxiallary pain w/ tylenol and tramadol. - Will give flonase for nasal turbinates inflammation. - Will give NS at 75cc/hr.  2) Hyperlipidemia: FLP- Total chol.-137, TG- 61, Hdl-34, LDL-101. Will continue Pravachol.  3) Hx of HTN: Pt currently not on any anti HTN meds. BP- mildly elevated in clinic. - Will start him on HCTZ.  4) VTE Proph: SCD's.  Addendum R3 Mr. Heber was examined and evaluated with Dr. Allena Katz. Patient is clinically stable complaining of severe headache. He denies any other symptoms at this time. Physcial exam is only remarkable for sinus tenderness. CT scan is indicative of acute on chronic sinusitis. We will treat with IV cefuroxime and hydrate him with NS. He also is now started on HCTZ given that he is mildly hypertensive in the clinic on several visits.  Clerance Lav MD PhD PGY-3

## 2010-02-02 NOTE — Discharge Summary (Signed)
Summary: Hospital Discharge Update    Hospital Discharge Update:  Date of Admission: 11/23/2009 Date of Discharge: 11/25/2009  Brief Summary:  Admitted for acute on chronic sinusitis with severe right sided headache and facial pain.  He was placed on Cefuroxime IV x 2 days and was then switched to Augmentin by mouth.  His headache and facial pain resolved after day 1 of admission.  HIV test was negative.  Other follow-up issues:  Blood pressure was slightly elevated but was not place on any meds.  I advised him low Na diet and exercise for now. May need to be on med if BP continues to elevate.  Problem list changes:  Added new problem of OTHER ACUTE SINUSITIS (ICD-461.8)  Medication list changes:  Removed medication of AMOXICILLIN 500 MG CAPS (AMOXICILLIN) Take 1 tablet by mouth three times a day for 10 days. Added new medication of AUGMENTIN 875-125 MG TABS (AMOXICILLIN-POT CLAVULANATE) 1 tablet by mouth two times a day x 14 days - Signed Rx of AUGMENTIN 875-125 MG TABS (AMOXICILLIN-POT CLAVULANATE) 1 tablet by mouth two times a day x 14 days;  #28 x 0;  Signed;  Entered by: Rosana Berger MD;  Authorized by: Rosana Berger MD;  Method used: Electronically to [facility] [address] [place], [place], [place]  [address], Ph: [phone], Fax: [phone]  Discharge medications:  PRAVACHOL 40 MG  TABS (PRAVASTATIN SODIUM) Take 1 tablet by mouth once a day ANACIN 81 MG  TBEC (ASPIRIN) Take 1 tablet by mouth once a day AUGMENTIN 875-125 MG TABS (AMOXICILLIN-POT CLAVULANATE) 1 tablet by mouth two times a day x 14 days  Other patient instructions:  Take Augmentin 875mg  1 tablet by mouth two times a day x 14 days for your sinusitis. If you have severe headache/facial pain, please call our office or go to the ED You have a follow up appointment with 12/07/2009 at 3:45Pm with Dr. Eben Burow- [facility]  Note: Hospital Discharge Medications & Other Instructions handout was  printed, one copy for patient and a second copy to be placed in hospital chart.

## 2010-02-02 NOTE — Progress Notes (Signed)
Summary: phone/gg  Phone Note Call from Patient   Caller: Patient Summary of Call: Pt c/o face being swollen on right side.  Headache and sweating.  Has taken advil for pain without relief. Onset  3 days ago.  will see this AM Initial call taken by: Merrie Roof RN,  November 23, 2009 9:15 AM

## 2010-02-02 NOTE — Assessment & Plan Note (Signed)
Summary: EST-ROUTINE CHECKUP/CH   Vital Signs:  Patient profile:   60 year old male Height:      67 inches (170.18 cm) Weight:      185.5 pounds (84.32 kg) BMI:     29.16 Temp:     97.7 degrees F (36.50 degrees C) oral Pulse rate:   90 / minute BP sitting:   136 / 92  (left arm) Cuff size:   regular  Vitals Entered By: Cynda Familia Duncan Dull) (December 07, 2009 3:06 PM) CC: HFU Is Patient Diabetic? No Pain Assessment Patient in pain? no      Nutritional Status BMI of > 30 = obese  Have you ever been in a relationship where you felt threatened, hurt or afraid?No   Does patient need assistance? Functional Status Self care Ambulation Normal   Primary Care Provider:  Lars Mage MD  CC:  HFU.  History of Present Illness: Patient is here today a hospital follow up. He was admitted for acute sinusitis.  Feels great. No sinus problems or headache. I advised him to try some steam therapy whenver he feels congested. He is still taking his antibiotics for sinusitis which were prescribed to him during hospitalization.  BP is slightly elevated today and we discussed about starting him on HCTZ today.  Colonoscopy recent, flu shot in hospital, lipid panel recent.   No other complaints.    Preventive Screening-Counseling & Management  Alcohol-Tobacco     Alcohol drinks/day: 0     Smoking Status: quit     Year Quit: 15+ YEARS AGO  Problems Prior to Update: 1)  Other Acute Sinusitis  (ICD-461.8) 2)  Cough  (ICD-786.2) 3)  Headache  (ICD-784.0) 4)  Sexual Activity, High Risk  (ICD-V69.2) 5)  Colonic Polyps, Adenomatous  (ICD-211.3) 6)  Hyperlipidemia  (ICD-272.4)  Medications Prior to Update: 1)  Pravachol 40 Mg  Tabs (Pravastatin Sodium) .... Take 1 Tablet By Mouth Once A Day 2)  Anacin 81 Mg  Tbec (Aspirin) .... Take 1 Tablet By Mouth Once A Day 3)  Amoxicillin 500 Mg Caps (Amoxicillin) .... Take 1 Tablet By Mouth Three Times A Day 4)  Robitussin Cough/chest Dm Max  10-200 Mg/103ml Liqd (Dextromethorphan-Guaifenesin) .... Drink 5ml Every 6 Hrs As Needed For Cough  Current Medications (verified): 1)  Pravachol 40 Mg  Tabs (Pravastatin Sodium) .... Take 1 Tablet By Mouth Once A Day 2)  Anacin 81 Mg  Tbec (Aspirin) .... Take 1 Tablet By Mouth Once A Day 3)  Hydrochlorothiazide 25 Mg Tabs (Hydrochlorothiazide) .... Take 1 Tablet By Mouth Once A Day  Allergies (verified): No Known Drug Allergies  Past History:  Past Medical History: Last updated: 02/28/2008 Hyperlipidemia History of rectal bleeding Colonic polyps, 3/06  and 2009; needs repeat in 3 yrs (3/12) Sexual partner with HIV Hypertension Pre-diabetes with Fasting glucose of 110 (2/09)  Family History: Last updated: 11/23/2009 Unknown.   Social History: Last updated: 01/08/2007 Lives with brother in [place].  Pt endorses a homosexual relationship with HIV+ partner.  Pt aware of risks but says condoms are always used for intercourse.  Risk Factors: Alcohol Use: 0 (12/07/2009) Exercise: yes (11/23/2009)  Risk Factors: Smoking Status: quit (12/07/2009)  Family History: Reviewed history from 11/23/2009 and no changes required. Unknown.   Social History: Reviewed history from 01/08/2007 and no changes required. Lives with brother in [place].  Pt endorses a homosexual relationship with HIV+ partner.  Pt aware of risks but says condoms are always used for intercourse.  Review of Systems      See HPI  Physical Exam  Additional Exam:  Gen: AOx3, in no acute distress Eyes: PERRL, EOMI ENT:MMM, No erythema noted in posterior pharynx, no tenderness noted on all the the sinus spots Neck: No JVD, No LAP Chest: CTAB with  good respiratory effort CVS: regular rhythmic rate, NO M/R/G, S1 S2 normal Abdo: soft,ND, BS+x4, Non tender and No hepatosplenomegaly EXT: No odema noted Neuro: Non focal, gait is normal Skin: no rashes noted.    Impression & Recommendations:  Problem # 1:   ESSENTIAL HYPERTENSION, BENIGN (ICD-401.1) Assessment Deteriorated Start him on HCTZ today for benign HTN. His updated medication list for this problem includes:    Hydrochlorothiazide 25 Mg Tabs (Hydrochlorothiazide) .Marland Kitchen... Take 1 tablet by mouth once a day  BP today: 136/92 Prior BP: 146/93 (11/23/2009)  Prior 10 Yr Risk Heart Disease: Not enough information (08/28/2006)  Labs Reviewed: K+: 3.6 (07/23/2009) Creat: : 1.20 (07/23/2009)   Chol: 172 (05/21/2009)   HDL: 48 (05/21/2009)   LDL: 108 (05/21/2009)   TG: 79 (05/21/2009)  Problem # 2:  OTHER ACUTE SINUSITIS (ICD-461.8) Assessment: Improved Patient was advised to take steam inhalations twice daily for symptomatic relief from sinus congestion over and above the meds that have been prescribed. The following medications were removed from the medication list:    Robitussin Cough/chest Dm Max 10-200 Mg/72ml Liqd (Dextromethorphan-guaifenesin) .Marland Kitchen... Drink 5ml every 6 hrs as needed for cough  Instructed on treatment. Call if symptoms persist or worsen.   Problem # 3:  COLONIC POLYPS, ADENOMATOUS (ICD-211.3) Assessment: Unchanged Colonoscopy due next year.  Problem # 4:  Preventive Health Care (ICD-V70.0) Assessment: Unchanged Up to date.  Complete Medication List: 1)  Pravachol 40 Mg Tabs (Pravastatin sodium) .... Take 1 tablet by mouth once a day 2)  Anacin 81 Mg Tbec (Aspirin) .... Take 1 tablet by mouth once a day 3)  Hydrochlorothiazide 25 Mg Tabs (Hydrochlorothiazide) .... Take 1 tablet by mouth once a day  Patient Instructions: 1)  Please schedule a follow-up appointment in 3 months. 2)  Limit your Sodium (Salt). 3)  It is important that you exercise regularly at least 20 minutes 5 times a week. If you develop chest pain, have severe difficulty breathing, or feel very tired , stop exercising immediately and seek medical attention. 4)  You need to lose weight. Consider a lower calorie diet and regular exercise.  5)  Take an  Aspirin every day. Prescriptions: HYDROCHLOROTHIAZIDE 25 MG TABS (HYDROCHLOROTHIAZIDE) Take 1 tablet by mouth once a day  #30 x 11   Entered and Authorized by:   Lars Mage MD   Signed by:   Lars Mage MD on 12/07/2009   Method used:   Electronically to        [facility] (retail)       [address]       [place], [place]  [address]       Ph: [phone]       Fax: [phone]   RxID:   [id number] HYDROCHLOROTHIAZIDE 25 MG TABS (HYDROCHLOROTHIAZIDE) Take 1 tablet by mouth once a day  #30 x 11   Entered and Authorized by:   Lars Mage MD   Signed by:   Lars Mage MD on 12/07/2009   Method used:   Electronically to        [facility] (retail)       [address]       [place], [place]  [address]       Ph: [phone]       Fax: [phone]   RxID:   [id number]    Orders Added: 1)  Est. Patient Level III [84[address]]   Immunization History:  Influenza Immunization History:    Influenza:  historical (11/23/2009)   Immunization History:  Influenza Immunization History:    Influenza:  Historical (11/23/2009)   Prevention & Chronic Care Immunizations   Influenza vaccine: Historical  (11/23/2009)   Influenza vaccine deferral: Not indicated  (05/21/2009)    Tetanus booster: Not documented   Td booster deferral: Not indicated  (05/21/2009)    Pneumococcal vaccine: Not documented  Colorectal Screening   Hemoccult: Not documented   Hemoccult action/deferral: Not indicated  (12/03/2008)    Colonoscopy: Location:  [facility].    (08/22/2007)   Colonoscopy action/deferral: Repeat colonoscopy in 3 years.     (03/09/2004)   Colonoscopy due: 09/2010  Other Screening   PSA: 0.66  (12/15/2006)   PSA action/deferral: Discussed-PSA declined  (05/21/2009)   Smoking status: quit  (12/07/2009)  Lipids   Total Cholesterol: 172  (05/21/2009)   LDL: 108  (05/21/2009)   LDL Direct: Not  documented   HDL: 48  (05/21/2009)   Triglycerides: 79  (05/21/2009)    SGOT (AST): 17  (07/23/2009)   SGPT (ALT): 14  (07/23/2009)   Alkaline phosphatase: 72  (07/23/2009)   Total bilirubin: 0.7  (07/23/2009)    Lipid flowsheet reviewed?: Yes   Progress toward LDL goal: At goal  Hypertension   Last Blood Pressure: 136 / 92  (12/07/2009)   Serum creatinine: 1.20  (07/23/2009)   Serum potassium 3.6  (07/23/2009)  Self-Management Support :   Personal Goals (by the next clinic visit) :      Personal blood pressure goal: 140/90  (12/03/2008)     Personal LDL goal: 130  (12/03/2008)    Hypertension self-management support: Resources for patients handout  (05/21/2009)    Lipid self-management support: Written self-care plan  (12/07/2009)   Lipid self-care plan printed.

## 2010-02-02 NOTE — Progress Notes (Signed)
  Phone Note Call from Patient   Caller: Patient Reason for Call: Acute Illness Complaint: Cough/Sore throat Summary of Call: Patient recently admitted to the hospital due to increased HA and also acute on chronic sinutis, which during his admission and couple of days prior to it was also complaining of cough. His cough was determined to be secondary to sinusitis and postnasal drip. While in the hsopital he received as needed cough syrup that help him controlling the cough and sleeping at night (which is when the cough is worse). He now call complaining of been forcefully coughing, practically non-stopping and after been coughing for while has noticed some streaks of blood in his flegm. Patient denies feverm chills, SOB, CP or any other complaints. At this point patient has been advised to use cough syrup that would be prescribed by me and to continue monitorizing his symptoms, he had an appoinment on 12/07/09 and will follow his complaints at that time. In case he noticed increase hemoptisis, fever, SOB or any worsening of his symptoms he was advised to come to the ED for further evaluation.    New/Updated Medications: ROBITUSSIN COUGH/CHEST DM MAX 10-200 MG/5ML LIQD (DEXTROMETHORPHAN-GUAIFENESIN) Drink 5ml every 6 hrs as needed for cough Prescriptions: ROBITUSSIN COUGH/CHEST DM MAX 10-200 MG/5ML LIQD (DEXTROMETHORPHAN-GUAIFENESIN) Drink 5ml every 6 hrs as needed for cough  #120 x 1   Entered and Authorized by:   Vassie Loll MD   Signed by:   Vassie Loll MD on 11/29/2009   Method used:   Electronically to        [facility] (retail)       [address]       [place], [place]  [address]       Ph: [phone]       Fax: [phone]   RxID:   [id number]

## 2010-02-02 NOTE — Progress Notes (Signed)
Summary: Elevated B/P  Phone Note Call from Patient   Caller: Patient Call For: Lars Mage MD Summary of Call: Call from pt took his B/P on yesterday and it read 167/115.  Took the B/P at [facility].   Is having headaches.  No blurred vision or dizziness.  No edema.  No headaches until looking at his blood pressure.  Hedaache has gone away.  Pt was previously on B/P med but was discontinued.  Pt was given an appointment for tomorrow at 3:30 PM to assess his blood pressure.Angelina Ok RN  July 22, 2009 4:54 PM  Initial call taken by: Angelina Ok RN,  July 22, 2009 4:49 PM  Follow-up for Phone Call        Thank you. Follow-up by: Lars Mage MD,  July 22, 2009 7:53 PM

## 2010-02-04 NOTE — Progress Notes (Signed)
  Phone Note Call from Patient   Reason for Call: Refill Medication Summary of Call: Pt calls requesting refill of pravastatin.  Refill submitted to pts preferred pharmacy. Initial call taken by: Nelda Bucks DO,  December 31, 2009 9:27 PM    Prescriptions: PRAVACHOL 40 MG  TABS (PRAVASTATIN SODIUM) Take 1 tablet by mouth once a day  #30 x 11   Entered and Authorized by:   Nelda Bucks DO   Signed by:   Nelda Bucks DO on 12/31/2009   Method used:   Electronically to        [facility] (retail)       [address]       [place], [place]  [address]       Ph: [phone]       Fax: [phone]   RxID:   [id number]

## 2010-02-09 ENCOUNTER — Inpatient Hospital Stay (INDEPENDENT_AMBULATORY_CARE_PROVIDER_SITE_OTHER)
Admission: RE | Admit: 2010-02-09 | Discharge: 2010-02-09 | Disposition: A | Discharge status: Home / Self Care | Payer: Self-pay | Source: Ambulatory Visit | Attending: Family Medicine | Admitting: Family Medicine

## 2010-02-09 ENCOUNTER — Ambulatory Visit (HOSPITAL_COMMUNITY): Admission: RE | Admit: 2010-02-09 | Payer: Self-pay | Source: Ambulatory Visit

## 2010-02-09 ENCOUNTER — Encounter: Payer: Self-pay | Admitting: *Deleted

## 2010-02-09 ENCOUNTER — Ambulatory Visit (INDEPENDENT_AMBULATORY_CARE_PROVIDER_SITE_OTHER): Payer: Self-pay

## 2010-02-09 DIAGNOSIS — M549 Dorsalgia, unspecified: Secondary | ICD-10-CM

## 2010-02-09 LAB — POCT URINALYSIS DIPSTICK
Bilirubin Urine: NEGATIVE
Nitrite: NEGATIVE
Urine Glucose, Fasting: NEGATIVE mg/dL
Urobilinogen, UA: 0.2 mg/dL (ref 0.0–1.0)

## 2010-02-09 NOTE — Progress Notes (Signed)
Pt presents c/o "cold" w/ nonproductive, dry cough for several days, denies fever, reports general weakness and achiness. Spoke w/ dr Aundria Rud, no open slots for walk-ins, referred to urg care, pt is agreeable.

## 2010-03-15 ENCOUNTER — Ambulatory Visit (INDEPENDENT_AMBULATORY_CARE_PROVIDER_SITE_OTHER): Payer: Self-pay | Admitting: Internal Medicine

## 2010-03-15 ENCOUNTER — Encounter: Payer: Self-pay | Admitting: Internal Medicine

## 2010-03-15 VITALS — BP 129/81 | HR 75 | Temp 97.3°F | Ht 67.0 in | Wt 192.2 lb

## 2010-03-15 DIAGNOSIS — E785 Hyperlipidemia, unspecified: Secondary | ICD-10-CM

## 2010-03-15 DIAGNOSIS — D126 Benign neoplasm of colon, unspecified: Secondary | ICD-10-CM

## 2010-03-15 DIAGNOSIS — I10 Essential (primary) hypertension: Secondary | ICD-10-CM

## 2010-03-15 LAB — COMPREHENSIVE METABOLIC PANEL
AST: 18 U/L (ref 0–37)
Albumin: 4.3 g/dL (ref 3.5–5.2)
Alkaline Phosphatase: 76 U/L (ref 39–117)
BUN: 16 mg/dL (ref 6–23)
Glucose, Bld: 91 mg/dL (ref 70–99)
Potassium: 3.9 mEq/L (ref 3.5–5.3)
Total Bilirubin: 0.4 mg/dL (ref 0.3–1.2)

## 2010-03-15 NOTE — Assessment & Plan Note (Signed)
Patient had her last cholesterol panel checked in November 2011 when he was hospitalized for acute sinusitis. Patient's LDL was 101 at that point of time. I would repeat cholesterol panel in another year as patient is compliant with his medications.

## 2010-03-15 NOTE — Assessment & Plan Note (Signed)
The patient's last colonoscopy was in 2009 August the next colonoscopy is due in 10 years in 2018.

## 2010-03-15 NOTE — Patient Instructions (Signed)
Gallbladder Disease You have gallbladder disease. This means that there are stones and/or inflammation in your gallbladder and bile duct system. The symptoms of this disease are: heartburn, nausea, belching, vomiting, and intolerance to certain foods (fatty foods mainly). Exact diagnosis of this condition requires ultrasound or special x-ray examination. Gallbladder symptoms may improve with a proper low-fat diet and weight loss (if you are overweight). Medicines to relieve pain and reduce spasms in the bile duct may be quite helpful. Usually the diseased gallbladder needs to be removed by surgery.  SEEK IMMEDIATE MEDICAL CARE IF YOU HAVE:  More severe or persistent pain that lasts for more than one day. The pain would most likely be in the upper right part of the abdomen.   Fever, repeated vomiting, or jaundice (yellow skin and eyes).  Document Released: 01/28/2004 Document Re-Released: 12/08/2008 [facility] Patient Information 2011 [place], [place].

## 2010-03-15 NOTE — Progress Notes (Signed)
  Subjective:    Patient ID: Dylan Lane, male    DOB: April 02, 1950, 60 y.o.   MRN: [id number]  HPI patient is a 60 year old man past medical history of hypertension hyperlipidemia and colonic polyps. Patient is here today for a followup appointment. Patient had set up an appointment sooner than her usual because of some abdominal pain. But he denies any abdominal pain at this time.    blood pressure is 129/81 today on hydrochlorothiazide 25 mg a day.    patient's cholesterol was checked in November 2011 while hospitalization and his LDL was 101. Patient is compliant with his Pravachol.    patient denies any chest pain shortness of breath changes in appetite or weight , changes in sleeping patterns, change in bowel habits , change in urinary habits, any peripheral edema,. Patient quit smoking in 2002 and has never been a drinker.    patient complaint of some abdominal pain he was mostly in the right upper quadrant 2-3/10 nonradiating extubated by eating relieved by nothing. Patient had one episode which lasted a few hours and has not had another episode.    Review of Systems  Constitutional: Negative for fever, activity change and appetite change.  HENT: Negative for sore throat.   Respiratory: Negative for cough and shortness of breath.   Cardiovascular: Negative for chest pain and leg swelling.  Gastrointestinal: Negative for nausea, abdominal pain, diarrhea, constipation and abdominal distention.  Genitourinary: Negative for frequency, hematuria and difficulty urinating.  Neurological: Negative for dizziness and headaches.  Psychiatric/Behavioral: Negative for suicidal ideas and behavioral problems.       Objective:   Physical Exam  Constitutional: He is oriented to person, place, and time. He appears well-developed and well-nourished.  HENT:  Head: Normocephalic and atraumatic.  Eyes: Conjunctivae and EOM are normal. Pupils are equal, round, and reactive to light. No scleral  icterus.  Neck: Normal range of motion. Neck supple. No JVD present. No thyromegaly present.  Cardiovascular: Normal rate, regular rhythm, normal heart sounds and intact distal pulses.  Exam reveals no gallop and no friction rub.   No murmur heard. Pulmonary/Chest: Effort normal and breath sounds normal. No respiratory distress. He has no wheezes. He has no rales.  Abdominal: Soft. Bowel sounds are normal. He exhibits no distension and no mass. There is no tenderness. There is no rebound and no guarding.  Musculoskeletal: Normal range of motion. He exhibits no edema and no tenderness.  Lymphadenopathy:    He has no cervical adenopathy.  Neurological: He is alert and oriented to person, place, and time.  Psychiatric: He has a normal mood and affect. His behavior is normal.          Assessment & Plan:

## 2010-03-15 NOTE — Assessment & Plan Note (Signed)
The patient blood pressure is well controlled today. Patient is currently on hydrochlorothiazide 25 mg a day. I would check a BMET today.

## 2010-03-16 LAB — CBC
HCT: 39.4 % (ref 39.0–52.0)
HCT: 40.2 % (ref 39.0–52.0)
Hemoglobin: 14.6 g/dL (ref 13.0–17.0)
MCH: 29.6 pg (ref 26.0–34.0)
MCHC: 36.3 g/dL — ABNORMAL HIGH (ref 30.0–36.0)
MCV: 81.5 fL (ref 78.0–100.0)
MCV: 82.4 fL (ref 78.0–100.0)
Platelets: 266 10*3/uL (ref 150–400)
Platelets: 283 10*3/uL (ref 150–400)
RDW: 12.7 % (ref 11.5–15.5)
RDW: 12.8 % (ref 11.5–15.5)
RDW: 12.8 % (ref 11.5–15.5)
WBC: 9.3 10*3/uL (ref 4.0–10.5)
WBC: 9.5 10*3/uL (ref 4.0–10.5)

## 2010-03-16 LAB — BASIC METABOLIC PANEL
BUN: 11 mg/dL (ref 6–23)
BUN: 11 mg/dL (ref 6–23)
BUN: 9 mg/dL (ref 6–23)
CO2: 25 mEq/L (ref 19–32)
Calcium: 10 mg/dL (ref 8.4–10.5)
Calcium: 9.4 mg/dL (ref 8.4–10.5)
Chloride: 101 mEq/L (ref 96–112)
Creatinine, Ser: 1.13 mg/dL (ref 0.4–1.5)
Creatinine, Ser: 1.24 mg/dL (ref 0.4–1.5)
GFR calc non Af Amer: >60 mL/min (ref >60)
GFR calc non Af Amer: >60 mL/min (ref >60)
GFR calc non Af Amer: >60 mL/min (ref >60)
GFR calc non Af Amer: >60 mL/min (ref >60)
Glucose, Bld: 111 mg/dL — ABNORMAL HIGH (ref 70–99)
Glucose, Bld: 120 mg/dL — ABNORMAL HIGH (ref 70–99)
Potassium: 3.5 mEq/L (ref 3.5–5.1)
Potassium: 3.9 mEq/L (ref 3.5–5.1)
Sodium: 137 mEq/L (ref 135–145)
Sodium: 137 mEq/L (ref 135–145)

## 2010-03-16 LAB — URINALYSIS, ROUTINE W REFLEX MICROSCOPIC
Bilirubin Urine: NEGATIVE
Hgb urine dipstick: NEGATIVE
Ketones, ur: 15 mg/dL — AB
Specific Gravity, Urine: 1.013 (ref 1.005–1.030)
pH: 5.5 (ref 5.0–8.0)

## 2010-03-16 LAB — RAPID URINE DRUG SCREEN, HOSP PERFORMED
Amphetamines: NOT DETECTED
Benzodiazepines: NOT DETECTED
Cocaine: NOT DETECTED
Opiates: NOT DETECTED
Tetrahydrocannabinol: NOT DETECTED

## 2010-03-16 LAB — HEMOGLOBIN A1C: Mean Plasma Glucose: 126 mg/dL — ABNORMAL HIGH (ref <117)

## 2010-03-16 LAB — LIPID PANEL
Cholesterol: 147 mg/dL (ref 0–200)
LDL Cholesterol: 101 mg/dL — ABNORMAL HIGH (ref 0–99)
Triglycerides: 61 mg/dL (ref <150)

## 2010-03-16 LAB — HIV ANTIBODY (ROUTINE TESTING W REFLEX): HIV: NONREACTIVE

## 2010-03-21 ENCOUNTER — Encounter: Payer: Self-pay | Admitting: Internal Medicine

## 2010-04-26 ENCOUNTER — Encounter: Payer: Self-pay | Admitting: Internal Medicine

## 2010-04-26 DIAGNOSIS — K625 Hemorrhage of anus and rectum: Secondary | ICD-10-CM | POA: Insufficient documentation

## 2010-04-26 DIAGNOSIS — I10 Essential (primary) hypertension: Secondary | ICD-10-CM

## 2010-04-30 ENCOUNTER — Ambulatory Visit: Payer: Self-pay | Admitting: Internal Medicine

## 2010-06-07 ENCOUNTER — Encounter: Payer: Self-pay | Admitting: Internal Medicine

## 2010-07-05 ENCOUNTER — Encounter: Payer: Self-pay | Admitting: *Deleted

## 2010-07-05 NOTE — Progress Notes (Unsigned)
Pt walked into clinic with c/o pinching feeling to left hip area, only last a few minutes goes away and returns.  This happens once or twice a day. No known cause or aggravating cause.  Onset  Last month. Nothing makes it better as it resolves so quickly. This concerns pt and he does not want to wait until the end of the month for evaluation.  Appointment give for this week. - Thursday

## 2010-07-05 NOTE — Progress Notes (Signed)
Fine

## 2010-07-08 ENCOUNTER — Encounter: Payer: Self-pay | Admitting: Internal Medicine

## 2010-07-12 ENCOUNTER — Encounter: Payer: Self-pay | Admitting: Internal Medicine

## 2010-07-12 ENCOUNTER — Ambulatory Visit (INDEPENDENT_AMBULATORY_CARE_PROVIDER_SITE_OTHER): Payer: Self-pay | Admitting: Internal Medicine

## 2010-07-12 VITALS — BP 131/88 | HR 99 | Temp 97.3°F | Ht 67.0 in | Wt 189.8 lb

## 2010-07-12 DIAGNOSIS — I10 Essential (primary) hypertension: Secondary | ICD-10-CM

## 2010-07-12 DIAGNOSIS — M25559 Pain in unspecified hip: Secondary | ICD-10-CM

## 2010-07-12 DIAGNOSIS — E785 Hyperlipidemia, unspecified: Secondary | ICD-10-CM

## 2010-07-12 DIAGNOSIS — D126 Benign neoplasm of colon, unspecified: Secondary | ICD-10-CM

## 2010-07-12 NOTE — Assessment & Plan Note (Addendum)
  Patient has been on hydrochlorothiazide. His blood pressure is well controlled. Today his blood pressure is 131/88 mmHg. He will continue his current regimen. CMBT was ordered.

## 2010-07-12 NOTE — Assessment & Plan Note (Signed)
Patient is free of pain for this week. Physical exam is completely normal. No further treatment or test. Patient will followed up patient.

## 2010-07-12 NOTE — Assessment & Plan Note (Addendum)
Colonoscopy done on Aug 22, 2007 showed adenomatous polyps. He does not have any symptoms and has not noticed any blood in his stool. Referral to GI for follow-up.

## 2010-07-12 NOTE — Progress Notes (Signed)
  Subjective:    Patient ID: Dylan Lane, male    DOB: 05/13/1950, 60 y.o.   MRN: [id number]  HPI Patient is 60 YO gentle man with PMH of hypertension, hyperlipidemia, and adenomatous colon polyps presents for a regular check-up and follow-up visit for his hip pain. He had hip pain for about a month, but he is free of pain in this week. The pain was located in his left hip area, pinching like, intermittent, 7/10 in severity and non-radiating. It usually happens once or twice a day, lasts for about 5 min each time. Long-standing makes it worse and nothing makes it better. It remains the same since it started until week ago. He did not have any pain in this week. Patient denies loss of sensation, muscle weakness or loss of control of bladder or bowel movement. He reports that he has been taking hydrochlorothiazide and pravastatin for his hypertension and hyperlipidemia, respectively. He does not have chest pain, SOB, palpitation or leg swelling. He wants to have his blood pressure and cholesterol checked.    Review of Systems  Constitutional: Negative.   HENT: Negative.   Eyes: Negative.   Respiratory: Negative.   Cardiovascular: Negative.   Gastrointestinal: Negative.   Musculoskeletal: Negative.   Skin: Negative.   Neurological: Negative.   Psychiatric/Behavioral: Negative.        Objective:   Physical Exam    General: alert, well-developed, and cooperative to examination. Eyes: vision grossly intact, pupils equal, pupils round, pupils reactive to light, no injection and anicteric.  Mouth: pharynx pink and moist, no erythema, and no exudates.  Neck: supple, full ROM, no thyromegaly, no JVD, and no carotid bruits.  Lungs: normal respiratory effort, no accessory muscle use, normal breath sounds, no crackles, and no wheezes. Heart: normal rate, regular rhythm, no murmur, no gallop, and no rub.  Abdomen: soft, non-tender, normal bowel sounds, no distention, no guarding, no rebound  tenderness, no hepatomegaly, and no splenomegaly.  Msk: no joint swelling, no joint warmth, and no redness over joints. No tenderness in his hips. Pullses: 2+ DP/PT pulses bilaterally Extremities: No cyanosis, clubbing, edema Neurologic: strength normal in all extremities, sensation intact to light touch, normal knee and ankle reflexes and gait normal.  Skin: turgor normal and no rashes.     Assessment & Plan:

## 2010-07-12 NOTE — Patient Instructions (Signed)
Please follow up with your GI physician for colonoscopy.

## 2010-07-12 NOTE — Assessment & Plan Note (Signed)
Patient has been taking Pravachol regularly. His last lipid panel done on Nov. 21, 2011 showed that his TG, LDL and HDL were 61, 101 and 34, respectively. He has not noticed any side effects, such as muscle pain. He will continue his current medication. CMBT was ordered to check his liver function.

## 2010-07-13 LAB — COMPLETE METABOLIC PANEL WITH GFR
ALT: 22 U/L (ref 0–53)
AST: 19 U/L (ref 0–37)
Albumin: 4.8 g/dL (ref 3.5–5.2)
Alkaline Phosphatase: 75 U/L (ref 39–117)
Potassium: 3.9 mEq/L (ref 3.5–5.3)
Sodium: 140 mEq/L (ref 135–145)
Total Protein: 7.6 g/dL (ref 6.0–8.3)

## 2010-07-13 NOTE — Progress Notes (Signed)
I saw patient and discussed his care with resident Dr. Niu.  I agree with the clinical findings and plans as outlined in his note. 

## 2010-07-27 ENCOUNTER — Encounter: Payer: Self-pay | Admitting: Internal Medicine

## 2010-08-02 ENCOUNTER — Encounter: Payer: Self-pay | Admitting: Internal Medicine

## 2010-09-16 ENCOUNTER — Telehealth: Payer: Self-pay | Admitting: Internal Medicine

## 2010-09-16 ENCOUNTER — Encounter: Payer: Self-pay | Admitting: Gastroenterology

## 2010-09-16 NOTE — Telephone Encounter (Signed)
Pt called concerned about eye twitching for the past few weeks as well as an episode of dizziness that occurred earlier today while he was at school.  States he was sitting at the "canteen", got up, and felt dizzy while walking to class. Denies any sob, chest pain, syncope, weakness, tingling, numbness, difficulty walking, slurred speech, visual changes or headache.  He has not experiences any recent fevers, chills, n/v/d or other problem.  He states he has not eaten today or had much to drink and feels this may have contributed to sx; no hx of DM per pt or EMR.  His symptoms have now resolved completely.  Based on his sx and hx, I believe it is safe for pt to f/u in clinic tomorrow or early next week however, if he develops recurrent dizziness, chest pain, sob, syncope, severe headache or other concerning symptom, he is advised to go to the nearest ER as soon as possible - I also informed him it is perfectly acceptable for him to come in to the ER if he is concerned about his sx.  Pt expresses understanding and agreement with this plan and will come to ER if dizziness recurs or if he develops new concerning sx.  Will send flag to triage and front desk for appt scheduling.

## 2010-09-17 ENCOUNTER — Telehealth: Payer: Self-pay | Admitting: *Deleted

## 2010-09-17 NOTE — Telephone Encounter (Signed)
Pt was called per Dr. Arvilla Market; unable to come today for 2:15 appt d/t classes but stated he felt better today. Appt has been scheduled for Tuesday 09/21/10 (his request) w/Dr. Tonny Branch.

## 2010-09-17 NOTE — Telephone Encounter (Signed)
Message copied by Hassan Buckler on Fri Sep 17, 2010 12:29 PM ------      Message from: Nelda Bucks T      Created: Thu Sep 16, 2010  9:45 PM      Regarding: Needs clinic appt asap       Pt called after hrs number (refer to phone note for details) and need f/u appt tomorrow (Friday 9/12) or early next week.  Thanks!            -K

## 2010-09-20 ENCOUNTER — Encounter: Payer: Self-pay | Admitting: Gastroenterology

## 2010-09-21 ENCOUNTER — Encounter: Payer: Self-pay | Admitting: Internal Medicine

## 2010-10-05 ENCOUNTER — Ambulatory Visit (INDEPENDENT_AMBULATORY_CARE_PROVIDER_SITE_OTHER): Payer: Self-pay | Admitting: Internal Medicine

## 2010-10-05 ENCOUNTER — Encounter: Payer: Self-pay | Admitting: Internal Medicine

## 2010-10-05 VITALS — BP 134/92 | HR 80 | Temp 97.3°F | Ht 67.0 in | Wt 191.5 lb

## 2010-10-05 DIAGNOSIS — I10 Essential (primary) hypertension: Secondary | ICD-10-CM

## 2010-10-05 DIAGNOSIS — D126 Benign neoplasm of colon, unspecified: Secondary | ICD-10-CM

## 2010-10-05 DIAGNOSIS — E785 Hyperlipidemia, unspecified: Secondary | ICD-10-CM

## 2010-10-05 NOTE — Assessment & Plan Note (Signed)
I will check patient's lipid profile on coming Tuesday as patient has already eaten his breakfast.

## 2010-10-05 NOTE — Assessment & Plan Note (Signed)
Patient to get his colonoscopy tomorrow for removal of polyps and also screening for malignant conversion of polyps.

## 2010-10-05 NOTE — Progress Notes (Signed)
  Subjective:    Patient ID: Dylan Lane, male    DOB: Oct 17, 1950, 60 y.o.   MRN: [id number]  HPI  Dylan Lane is a 60 year old man with past medical history of hypertension, hyperlipidemia and colonic polyps comes in today for a regular followup visit.  He does not have any new complaints. He has his colonoscopy scheduled for tomorrow. Has been compliant with all his medications. Blood pressure is slightly above goal.  Flu shot was taken at a drugstore.   Review of Systems  Constitutional: Negative for fever, activity change and appetite change.  HENT: Negative for sore throat.   Respiratory: Negative for cough and shortness of breath.   Cardiovascular: Negative for chest pain and leg swelling.  Gastrointestinal: Negative for nausea, abdominal pain, diarrhea, constipation and abdominal distention.  Genitourinary: Negative for frequency, hematuria and difficulty urinating.  Neurological: Negative for dizziness and headaches.  Psychiatric/Behavioral: Negative for suicidal ideas and behavioral problems.       Objective:   Physical Exam  Constitutional: He is oriented to person, place, and time. He appears well-developed and well-nourished.  HENT:  Head: Normocephalic and atraumatic.  Eyes: Conjunctivae and EOM are normal. Pupils are equal, round, and reactive to light. No scleral icterus.  Neck: Normal range of motion. Neck supple. No JVD present. No thyromegaly present.  Cardiovascular: Normal rate, regular rhythm, normal heart sounds and intact distal pulses.  Exam reveals no gallop and no friction rub.   No murmur heard. Pulmonary/Chest: Effort normal and breath sounds normal. No respiratory distress. He has no wheezes. He has no rales.  Abdominal: Soft. Bowel sounds are normal. He exhibits no distension and no mass. There is no tenderness. There is no rebound and no guarding.  Musculoskeletal: Normal range of motion. He exhibits no edema and no tenderness.  Lymphadenopathy:   He has no cervical adenopathy.  Neurological: He is alert and oriented to person, place, and time.  Psychiatric: He has a normal mood and affect. His behavior is normal.          Assessment & Plan:

## 2010-10-05 NOTE — Patient Instructions (Signed)
Calorie Counting Diet A calorie counting diet requires you to eat the number of calories that are right for you during a day. Calories are the measurement of how much energy you get from the food you eat. Eating the right amount of calories is important for staying at a healthy weight. If you eat too many calories your body will store them as fat and you may gain weight. If you eat too few calories you may lose weight. Counting the number of calories that you eat during a day will help you to know if you're eating the right amount. A Registered Dietitian can determine how many calories you need in a day. The amount of calories you need varies from person to person. If your goal is to lose weight you will need to eat fewer calories. Losing weight can benefit you if you are overweight or have health problems such as heart disease, high blood pressure or diabetes. If your goal is to gain weight, you will need to eat more calories. Gaining weight may be necessary if you have a certain health problem that causes your body to need more energy. TIPS Whether you are increasing or decreasing the number of calories you eat during a day, it may be hard to get used to changing what you eat and drink. The following are tips to help you keep track of the number of calories you are eating.  Measuring foods at home with measuring cups will help you to know the actual amount of food and number of calories you are eating.   Restaurants serve food in all different portion sizes. It is common that restaurants will serve food in amounts worth 2 or more serving sizes. While eating out, it may be helpful to estimate how many servings of a food you are given. For example, a serving of cooked rice is 1/2 cup and that is the size of half of a fist. Knowing serving sizes will help you have a better idea of how much food you are eating at restaurants.   Ask for smaller portion sizes or child-size portions at restaurants.   Plan to  eat half of a meal at a restaurant and take the rest home or share the other half with a friend   Read food labels for calorie content and serving size   Most packaged food has a Nutrition Facts Panel on its side or back. Here you can find out how many servings are in a package, the size of a serving, and the number of calories each serving has.   The serving size and number of servings per container are listed right below the Nutrition Facts heading. Just below the serving information, the number of calories in each serving is listed.   For example, say that a package has three cookies inside. The Nutrition Facts panel says that one serving is one cookie. Below that, it says that there are three servings in the container. The calories section of the Nutrition Facts says there are 90 calories. That means that there are 90 calories in one cookie. If you eat one cookie you have eaten 90 calories. If you eat all three cookies, you have eaten three times that amount, or 270 calories.  The list below tells you how big or small some common portion sizes are.  1 ounce (oz).................4 stacked dice.   3 oz..............................Deck of cards.   1 teaspoon (tsp)...........Tip of little finger.   1 tablespoon (Tbsp)....Tip of thumb.     2 Tbsp..........................Golf ball.    Cup..........................Half of a fist.   1 Cup...........................A fist.  KEEP A FOOD LOG Write down every food item that you eat, how much of the food you eat, and the number of calories in each food that you eat during the day. At the end of the day or throughout the day you can add up the total number of calories you have eaten.  It may help to set up a list like the one below. Find out the calorie information by reading food labels.  Breakfast   Bran Flakes (1 cup, 110 calories).   Fat free milk ( cup, 45 calories).   Snack   Apple (1 medium, 80 calories).   Lunch   Spinach (1  cup, 20 calories).   Tomato ( medium, 20 calories).   Chicken breast strips (3 oz, 165 calories).   Shredded cheddar cheese ( cup, 110 calories).   Light Italian dressing (2 Tbsp, 60 calories).   Whole wheat bread (1 slice, 80 calories).   Tub margarine (1 tsp, 35 calories).   Vegetable soup (1 cup, 160 calories).   Dinner   Pork chop (3 oz, 190 calories).   Brown rice (1 cup, 215 calories).   Steamed broccoli ( cup, 20 calories).   Strawberries (1  cup, 65 calories).   Whipped cream (1 Tbsp, 50 calories).  Daily Calorie Total: 1425 Information from www.eatright.org, Foodwise Nutritional Analysis Database. Document Released: 12/20/2004 Document Re-Released: 01/11/2009 Exit[employer] Patient Information 2011 [employer]. 

## 2010-10-05 NOTE — Assessment & Plan Note (Signed)
Patient is slightly above goal but i would not increase his meds at this time. We discussed about weight loss and exercise as potential treatment strategies.

## 2010-10-06 ENCOUNTER — Ambulatory Visit (AMBULATORY_SURGERY_CENTER): Payer: Self-pay | Admitting: *Deleted

## 2010-10-06 ENCOUNTER — Telehealth: Payer: Self-pay | Admitting: Gastroenterology

## 2010-10-06 VITALS — Ht 67.0 in | Wt 191.6 lb

## 2010-10-06 DIAGNOSIS — Z1211 Encounter for screening for malignant neoplasm of colon: Secondary | ICD-10-CM

## 2010-10-06 NOTE — Telephone Encounter (Signed)
ok 

## 2010-10-06 NOTE — Telephone Encounter (Signed)
Pt says he does not want to have colonoscopy with Propofol sedation.  He prefers the type of sedation that he had for his last procedure.  He says that he slept through procedure and was not uncomfortable.  I told him that we could guarantee that he would not be asleep for procedure because he required a lot of sedation for prior colonoscopy.  Pt is aware and still wants to have moderate sedation instead of propofol. Procedure rescheduled for 10/17 at 8:00.  Revised prep instructions reviewed with patient. Dylan Lane

## 2010-10-06 NOTE — Progress Notes (Signed)
Pt does not have insurance; given sample of Suprep. Ezra Sites

## 2010-10-11 ENCOUNTER — Telehealth: Payer: Self-pay | Admitting: Gastroenterology

## 2010-10-11 NOTE — Telephone Encounter (Signed)
Pts colon moved to 10/14/10 @4pm  with Dr. Arlyce Dice in the [facility] with propofol. Pt given new instructions and verbalized understanding.

## 2010-10-14 ENCOUNTER — Encounter: Payer: Self-pay | Admitting: Gastroenterology

## 2010-10-14 ENCOUNTER — Ambulatory Visit (AMBULATORY_SURGERY_CENTER): Payer: Self-pay | Admitting: Gastroenterology

## 2010-10-14 VITALS — BP 118/75 | HR 96 | Temp 96.4°F | Resp 22 | Ht 67.0 in | Wt 191.0 lb

## 2010-10-14 DIAGNOSIS — D126 Benign neoplasm of colon, unspecified: Secondary | ICD-10-CM

## 2010-10-14 DIAGNOSIS — Z1211 Encounter for screening for malignant neoplasm of colon: Secondary | ICD-10-CM

## 2010-10-14 DIAGNOSIS — Z86010 Personal history of colonic polyps: Secondary | ICD-10-CM

## 2010-10-14 MED ORDER — SODIUM CHLORIDE 0.9 % IV SOLN: 500.0000 mL | INTRAVENOUS | Status: DC

## 2010-10-15 ENCOUNTER — Telehealth: Payer: Self-pay | Admitting: *Deleted

## 2010-10-15 NOTE — Telephone Encounter (Signed)

## 2010-10-20 ENCOUNTER — Other Ambulatory Visit: Payer: Self-pay | Admitting: Gastroenterology

## 2010-10-22 ENCOUNTER — Telehealth: Payer: Self-pay | Admitting: Gastroenterology

## 2010-10-22 NOTE — Telephone Encounter (Signed)
Patient received his letter and wants to confirm he has 5 year recall. Confirmed with patient.

## 2010-11-22 ENCOUNTER — Encounter: Payer: Self-pay | Admitting: Internal Medicine

## 2010-12-13 NOTE — Progress Notes (Signed)
Addended by: Bufford Spikes on: 12/13/2010 04:28 PM   Modules accepted: Orders

## 2010-12-14 ENCOUNTER — Other Ambulatory Visit (INDEPENDENT_AMBULATORY_CARE_PROVIDER_SITE_OTHER): Payer: Self-pay

## 2010-12-14 DIAGNOSIS — E785 Hyperlipidemia, unspecified: Secondary | ICD-10-CM

## 2010-12-14 LAB — LIPID PANEL
Total CHOL/HDL Ratio: 4.8 Ratio
VLDL: 19 mg/dL (ref 0–40)

## 2010-12-24 ENCOUNTER — Other Ambulatory Visit: Payer: Self-pay | Admitting: Internal Medicine

## 2010-12-29 ENCOUNTER — Other Ambulatory Visit: Payer: Self-pay | Admitting: Internal Medicine

## 2010-12-31 ENCOUNTER — Telehealth: Payer: Self-pay | Admitting: *Deleted

## 2010-12-31 NOTE — Telephone Encounter (Signed)
RTC to pt information about his lab results.  Pt per Dr. Sumner Boast note will need a change in medication to be discussed at his next visit.  Pt said that he will call for an appointment and was given some ideas as to what foods to cut back on the help with his Cholesterol.  Pt voiced understanding of and will cut back on fried foods.  Angelina Ok, RN 12/31/2010 12:43 PM

## 2011-01-25 ENCOUNTER — Other Ambulatory Visit: Payer: Self-pay | Admitting: Family Medicine

## 2011-01-28 ENCOUNTER — Other Ambulatory Visit: Payer: Self-pay | Admitting: *Deleted

## 2011-01-28 MED ORDER — PRAVASTATIN SODIUM 40 MG PO TABS: 40.0000 mg | ORAL_TABLET | Freq: Every day | ORAL | Status: DC

## 2011-02-21 ENCOUNTER — Encounter: Payer: Self-pay | Admitting: Internal Medicine

## 2011-03-09 ENCOUNTER — Encounter: Payer: Self-pay | Admitting: Internal Medicine

## 2011-03-09 ENCOUNTER — Ambulatory Visit (INDEPENDENT_AMBULATORY_CARE_PROVIDER_SITE_OTHER): Payer: Self-pay | Admitting: Internal Medicine

## 2011-03-09 VITALS — BP 129/87 | HR 71 | Temp 96.9°F | Ht 67.0 in | Wt 192.9 lb

## 2011-03-09 DIAGNOSIS — I10 Essential (primary) hypertension: Secondary | ICD-10-CM

## 2011-03-09 DIAGNOSIS — M25559 Pain in unspecified hip: Secondary | ICD-10-CM

## 2011-03-09 LAB — BASIC METABOLIC PANEL WITH GFR
GFR, Est African American: 72 mL/min
GFR, Est Non African American: 62 mL/min
Potassium: 3.7 mEq/L (ref 3.5–5.3)
Sodium: 141 mEq/L (ref 135–145)

## 2011-03-09 NOTE — Assessment & Plan Note (Signed)
Patient complains of groin pain which is worse with walking in high heeled shoes. I have asked him to wear flat bottom sports/tennis shoes. There is no medical abnormality noted on physical exam.  I asked him to use tylenol or motrin.

## 2011-03-09 NOTE — Progress Notes (Signed)
  Subjective:    Patient ID: Reco Shonk, male    DOB: 1950/11/01, 61 y.o.   MRN: [id number]  HPI  Patient is 61 year old patient of mine who is here today for a regular check up.   Patient complains of a nagging pain in the left groin which is occasional(once in week). The pain comes and go and is worse with wearing high heal shoes and walking and is relieved with rest. No swelling, itching, fever, difficulty urination or constipation noted.  No other complaints at this time.   Review of Systems  Constitutional: Negative for fever, activity change and appetite change.  HENT: Negative for sore throat.   Respiratory: Negative for cough and shortness of breath.   Cardiovascular: Negative for chest pain and leg swelling.  Gastrointestinal: Negative for nausea, abdominal pain, diarrhea, constipation and abdominal distention.  Genitourinary: Negative for frequency, hematuria and difficulty urinating.  Neurological: Negative for dizziness and headaches.  Psychiatric/Behavioral: Negative for suicidal ideas and behavioral problems.       Objective:   Physical Exam  Constitutional: He is oriented to person, place, and time. He appears well-developed and well-nourished.  HENT:  Head: Normocephalic and atraumatic.  Eyes: Conjunctivae and EOM are normal. Pupils are equal, round, and reactive to light. No scleral icterus.  Neck: Normal range of motion. Neck supple. No JVD present. No thyromegaly present.  Cardiovascular: Normal rate, regular rhythm, normal heart sounds and intact distal pulses.  Exam reveals no gallop and no friction rub.   No murmur heard. Pulmonary/Chest: Effort normal and breath sounds normal. No respiratory distress. He has no wheezes. He has no rales.  Abdominal: Soft. Bowel sounds are normal. He exhibits no distension and no mass. There is no tenderness. There is no rebound and no guarding.       No hernia noted on cough reflex. No swelling or mass noted.    Musculoskeletal: Normal range of motion. He exhibits no edema and no tenderness.  Lymphadenopathy:    He has no cervical adenopathy.  Neurological: He is alert and oriented to person, place, and time.  Psychiatric: He has a normal mood and affect. His behavior is normal.          Assessment & Plan:

## 2011-03-09 NOTE — Assessment & Plan Note (Signed)
Check BMP today. Patient had hypercalcemia on last BM<P which may be 2/2 use of HCTZ. If S. Ca is high, I will have him change to norvasc.

## 2011-06-08 ENCOUNTER — Encounter: Payer: Self-pay | Admitting: Internal Medicine

## 2011-06-09 ENCOUNTER — Encounter: Payer: Self-pay | Admitting: Internal Medicine

## 2011-06-13 ENCOUNTER — Ambulatory Visit (INDEPENDENT_AMBULATORY_CARE_PROVIDER_SITE_OTHER): Payer: Self-pay | Admitting: Internal Medicine

## 2011-06-13 VITALS — BP 125/87 | HR 76 | Temp 96.5°F | Wt 190.0 lb

## 2011-06-13 DIAGNOSIS — I10 Essential (primary) hypertension: Secondary | ICD-10-CM

## 2011-06-13 DIAGNOSIS — D126 Benign neoplasm of colon, unspecified: Secondary | ICD-10-CM

## 2011-06-13 DIAGNOSIS — E785 Hyperlipidemia, unspecified: Secondary | ICD-10-CM

## 2011-06-13 NOTE — Progress Notes (Signed)
  Subjective:    Patient ID: Dylan Lane, male    DOB: 1950-06-10, 61 y.o.   MRN: [id number]  HPI Patient is 61 year old manwith PMH most significant for HTN, hyperlipidemia and colonic polyps.  His bp is well controlled with HCTZ. KFT's done in March 2013.  Lipid profile checked 6 months ago and LDL was 113.   Groin pain has disappeared since last visit.  Patient had colonoscopy done in Nov 2012 which was negative for high grade neoplasia. The results were discussed with the patient. Patient says that he will be seen by Dr. Arlyce Dice in 6 months time as a follow up but the procedure report says that he should be seen in 5 years time.      Review of Systems  Constitutional: Negative for fever, activity change and appetite change.  HENT: Negative for sore throat.   Respiratory: Negative for cough and shortness of breath.   Cardiovascular: Negative for chest pain and leg swelling.  Gastrointestinal: Negative for nausea, abdominal pain, diarrhea, constipation and abdominal distention.  Genitourinary: Negative for frequency, hematuria and difficulty urinating.  Neurological: Negative for dizziness and headaches.  Psychiatric/Behavioral: Negative for suicidal ideas and behavioral problems.       Objective:   Physical Exam  Constitutional: He is oriented to person, place, and time. He appears well-developed and well-nourished.  HENT:  Head: Normocephalic and atraumatic.  Eyes: Conjunctivae and EOM are normal. Pupils are equal, round, and reactive to light. No scleral icterus.  Neck: Normal range of motion. Neck supple. No JVD present. No thyromegaly present.  Cardiovascular: Normal rate, regular rhythm, normal heart sounds and intact distal pulses.  Exam reveals no gallop and no friction rub.   No murmur heard. Pulmonary/Chest: Effort normal and breath sounds normal. No respiratory distress. He has no wheezes. He has no rales.  Abdominal: Soft. Bowel sounds are normal. He exhibits no  distension and no mass. There is no tenderness. There is no rebound and no guarding.  Musculoskeletal: Normal range of motion. He exhibits no edema and no tenderness.  Lymphadenopathy:    He has no cervical adenopathy.  Neurological: He is alert and oriented to person, place, and time.  Psychiatric: He has a normal mood and affect. His behavior is normal.          Assessment & Plan:

## 2011-06-13 NOTE — Assessment & Plan Note (Addendum)
Follow up with Dr. Arlyce Dice as scheduled in 2017. Patient had tubular adenoma removed in October 2012.

## 2011-06-13 NOTE — Assessment & Plan Note (Signed)
No changes needed today. Crt and K checked 3 months ago. No need to repeat BMP.

## 2011-06-13 NOTE — Patient Instructions (Signed)

## 2011-06-13 NOTE — Assessment & Plan Note (Signed)
Lipid panel in 6 months

## 2011-07-01 ENCOUNTER — Telehealth: Payer: Self-pay | Admitting: *Deleted

## 2011-07-01 NOTE — Telephone Encounter (Signed)
I believe it is very difficult to evaluate him for low abdominal pain over the phoen and after talking to gayle, it seems that we do not have any openings at this time next week. If the pain is unbearable, the best thing at this time is to be evaluated at an urgent care center/ER.

## 2011-07-01 NOTE — Telephone Encounter (Signed)
Pt informed and voices understanding 

## 2011-07-01 NOTE — Telephone Encounter (Signed)
Pt called with c/o 3 days of low abd pain with radiation to back.  He has some more pain with deep breathing. He has taken IBU without relief.  Pain is on and off, increase at night.   Decrease appetite.  Denies nausea,vomiting or diarrhea. Hx of same pain in past, quite a while ago.  Pt wants to be seen or advise.

## 2011-10-13 ENCOUNTER — Encounter: Payer: Self-pay | Admitting: Internal Medicine

## 2011-10-17 ENCOUNTER — Encounter: Payer: Self-pay | Admitting: Internal Medicine

## 2011-10-18 ENCOUNTER — Encounter: Payer: Self-pay | Admitting: Internal Medicine

## 2011-11-21 ENCOUNTER — Ambulatory Visit (INDEPENDENT_AMBULATORY_CARE_PROVIDER_SITE_OTHER): Payer: Self-pay | Admitting: Internal Medicine

## 2011-11-21 ENCOUNTER — Encounter: Payer: Self-pay | Admitting: Internal Medicine

## 2011-11-21 VITALS — BP 134/91 | HR 71 | Temp 96.7°F | Ht 67.0 in | Wt 191.9 lb

## 2011-11-21 DIAGNOSIS — Z23 Encounter for immunization: Secondary | ICD-10-CM

## 2011-11-21 DIAGNOSIS — Z299 Encounter for prophylactic measures, unspecified: Secondary | ICD-10-CM

## 2011-11-21 DIAGNOSIS — Z0000 Encounter for general adult medical examination without abnormal findings: Secondary | ICD-10-CM | POA: Insufficient documentation

## 2011-11-21 DIAGNOSIS — I10 Essential (primary) hypertension: Secondary | ICD-10-CM

## 2011-11-21 NOTE — Assessment & Plan Note (Signed)
Flu shot today 

## 2011-11-21 NOTE — Assessment & Plan Note (Signed)
Well controlled. Check BMP today to check K and crt

## 2011-11-21 NOTE — Patient Instructions (Signed)
Follow up in 6 months 

## 2011-11-21 NOTE — Progress Notes (Signed)
  Subjective:    Patient ID: Dylan Lane, male    DOB: 01/18/1950, 61 y.o.   MRN: [id number]  HPI  Patient is 61 year old patient of mine who is here today for a regular follow up.  BP is well controlled on current meds.   Due for a flu shot today.  No other complaints.  Review of Systems  Constitutional: Negative for fever, activity change and appetite change.  HENT: Negative for sore throat.   Respiratory: Negative for cough and shortness of breath.   Cardiovascular: Negative for chest pain and leg swelling.  Gastrointestinal: Negative for nausea, abdominal pain, diarrhea, constipation and abdominal distention.  Genitourinary: Negative for frequency, hematuria and difficulty urinating.  Neurological: Negative for dizziness and headaches.  Psychiatric/Behavioral: Negative for suicidal ideas and behavioral problems.       Objective:   Physical Exam  Constitutional: He is oriented to person, place, and time. He appears well-developed and well-nourished.  HENT:  Head: Normocephalic and atraumatic.  Eyes: Conjunctivae normal and EOM are normal. Pupils are equal, round, and reactive to light. No scleral icterus.  Neck: Normal range of motion. Neck supple. No JVD present. No thyromegaly present.  Cardiovascular: Normal rate, regular rhythm, normal heart sounds and intact distal pulses.  Exam reveals no gallop and no friction rub.   No murmur heard. Pulmonary/Chest: Effort normal and breath sounds normal. No respiratory distress. He has no wheezes. He has no rales.  Abdominal: Soft. Bowel sounds are normal. He exhibits no distension and no mass. There is no tenderness. There is no rebound and no guarding.  Musculoskeletal: Normal range of motion. He exhibits no edema and no tenderness.  Lymphadenopathy:    He has no cervical adenopathy.  Neurological: He is alert and oriented to person, place, and time.  Psychiatric: He has a normal mood and affect. His behavior is normal.           Assessment & Plan:

## 2011-11-22 LAB — BASIC METABOLIC PANEL WITH GFR
Chloride: 100 mEq/L (ref 96–112)
Creat: 1.34 mg/dL (ref 0.50–1.35)
GFR, Est African American: 67 mL/min
GFR, Est Non African American: 58 mL/min — ABNORMAL LOW
Potassium: 3.9 mEq/L (ref 3.5–5.3)

## 2011-12-30 ENCOUNTER — Telehealth: Payer: Self-pay | Admitting: Internal Medicine

## 2011-12-30 NOTE — Telephone Encounter (Signed)
  INTERNAL MEDICINE RESIDENCY PROGRAM After-Hours Telephone Call    Reason for call:   I received a call from [name] at 5:00 AM indicating penile discharge.    Pertinent Data:   The patient notes a 2-week history of penile discharge, noted as a small amount of white discharge in his underwear.  He notes that he has not been sexually active in > 6 months, and has not had frequent UTI's in the past.  He notes no dysuria, hematuria, or fevers.  He states he has an old prescription of amoxicillin from 2 years ago, and asks if he should take this.    Assessment / Plan / Recommendations:   The patient's symptoms may represent UTI (though no dysuria) vs STD (though patient denies recent sexual activity).  I advise the patient to present to a clinic appointment to test for these possibilities.  I advise him to not take his 61-year old amoxicillin prescription.  I've sent a message to the Clinic to try to get the patient an appointment in the next 1-2 weeks.  As always, pt is advised that if symptoms worsen or new symptoms arise, they should go to an urgent care facility or to to ER for further evaluation.    Linward Headland, MD   12/30/2011, 5:13 AM

## 2012-01-05 ENCOUNTER — Telehealth: Payer: Self-pay | Admitting: *Deleted

## 2012-01-05 NOTE — Telephone Encounter (Signed)
Pt calls and states he has recently started having a penile disch, it is starting to irritate him and he has an appt 1/6 w/ dr Clyde Lundborg. He is advised if it becomes worse or he feels he needs to be seen before that time to please go to urg care or ED, he is agreeable and states  That he wants the doctors to know there is no sex involved.

## 2012-01-08 NOTE — Telephone Encounter (Signed)
Agree with evaluation and suggestion.

## 2012-01-09 ENCOUNTER — Encounter: Payer: Self-pay | Admitting: Internal Medicine

## 2012-01-09 ENCOUNTER — Other Ambulatory Visit: Payer: Self-pay | Admitting: Internal Medicine

## 2012-01-09 ENCOUNTER — Ambulatory Visit (INDEPENDENT_AMBULATORY_CARE_PROVIDER_SITE_OTHER): Payer: Self-pay | Admitting: Internal Medicine

## 2012-01-09 VITALS — BP 130/88 | HR 68 | Temp 97.0°F | Ht 67.0 in | Wt 195.4 lb

## 2012-01-09 DIAGNOSIS — R369 Urethral discharge, unspecified: Secondary | ICD-10-CM

## 2012-01-09 NOTE — Patient Instructions (Addendum)
1. I will call you when your tests results come back. Please come back to hospital if your symptoms get worse. 2. Please take all medications as prescribed.  3. If you have worsening of your symptoms or new symptoms arise, please call the clinic ([phone]), or go to the ER immediately if symptoms are severe.

## 2012-01-09 NOTE — Progress Notes (Addendum)
Patient ID: Dylan Lane, male   DOB: 22-Apr-1950, 62 y.o.   MRN: [id number]  Subjective:   Patient ID: Dylan Lane male   DOB: Aug 15, 1950 62 y.o.   MRN: [id number]  HPI: [name] is a 62 y.o. with  past medical history as outlined below, who presents for an acute visit.  Patient reports having penile discharge for about 3 weeks. The discharge is in small amount and white in color. There is no associated symptoms, such as pain, burning sensation or lesions. Patient does not have fever or chills.  She has a gay relationship with male partner in the past year, but he denies any sexual intercourse in the past year.   Past Medical History  Diagnosis Date  . Hyperlipidemia   . Hypertension   . Rectal bleeding     History of  . Pre-diabetes     with fasting glucose of 110(2/09)  . Colon polyps     3/06 and 2009:needs repeat in 3 yrs(3/12)   Current Outpatient Prescriptions  Medication Sig Dispense Refill  . aspirin 81 MG EC tablet Take 81 mg by mouth daily.        . hydrochlorothiazide (HYDRODIURIL) 25 MG tablet TAKE ONE TABLET BY MOUTH EVERY DAY  30 tablet  11  . pravastatin (PRAVACHOL) 40 MG tablet Take 1 tablet (40 mg total) by mouth daily.  30 tablet  11   Family History  Problem Relation Age of Onset  . Colon cancer Neg Hx   . Rectal cancer Neg Hx   . Stomach cancer Neg Hx    History   Social History  . Marital Status: Single    Spouse Name: N/A    Number of Children: N/A  . Years of Education: N/A   Social History Main Topics  . Smoking status: Former Smoker    Types: Cigarettes    Quit date: 03/14/2000  . Smokeless tobacco: None  . Alcohol Use: No  . Drug Use: No  . Sexually Active: None   Other Topics Concern  . None   Social History Narrative   Lives with brother in [place]. Pt endorses a homosexual relationshup with HIV partner+. Pt aware of risks but says condoms are always used for intercourse.Financial assistance approved for 100% discount  at [facility] and has [facility] card per Gavin [place]11/14/2011   Review of Systems: As per HPI  Objective:  Physical Exam: Filed Vitals:   01/09/12 0907  BP: 130/88  Pulse: 68  Temp: 97 F (36.1 C)  TempSrc: Oral  Height: 5\' 7"  (1.702 m)  Weight: 195 lb 6.4 oz (88.633 kg)  SpO2: 98%   General: resting in bed, not in acute distress HEENT: PERRL, EOMI, no scleral icterus Cardiac: S1/S2, RRR, No murmurs, gallops or rubs Pulm: Good air movement bilaterally, Clear to auscultation bilaterally, No rales, wheezing, rhonchi or rubs. Abd: Soft,  nondistended, nontender, no rebound pain, no organomegaly, BS present Ext: No rashes or edema, 2+DP/PT pulse bilaterally Musculoskeletal: No joint deformities, erythema, or stiffness, ROM full and no nontender Skin: no rashes. No skin bruise. UG: circumcised penis, no lesions on penis or groin area. No discharge can be expressed. Prostate gland is not enlarged, has no mass or nodules, no tenderness. No node enlargement groin area.  Neuro: alert and oriented X3, cranial nerves II-XII grossly intact, muscle strength 5/5 in all extremeties,  sensation to light touch intact.  Psych.: patient is not psychotic, no suicidal or hemocidal ideation.    Assessment & Plan:  Addendum:  Patient has negative workup, including urinalysis, Neisseria gonorrhea, Chlamydia, Trichomonas and urine culture. His penile discharge etiology is not clear. Will follow up, if persists, will give a referral to urologist.  Addendum:  I called patient today (01/12/12). Patient did not have noticed any discharge again since he was seen in clinic. Patient is instructed to come back if he notices discharge again.   Lorretta Harp, MD PGY2, Internal Medicine Teaching Service Pager: [phone]

## 2012-01-09 NOTE — Assessment & Plan Note (Signed)
Etiology is not clear currently. Patient is asymptomatic, no signs for urinary tract infection. Prostate gland is grossly normal on physical examination. There's no discharge can be expressed from penis.   -Will get urinalysis and urine culture -Will get Chlamydia, gonorrhea and trichomonas cytology -Patient is instructed to come back to hospital  if his symptoms get worse

## 2012-01-10 ENCOUNTER — Other Ambulatory Visit: Payer: Self-pay | Admitting: Internal Medicine

## 2012-01-10 DIAGNOSIS — E785 Hyperlipidemia, unspecified: Secondary | ICD-10-CM

## 2012-01-10 DIAGNOSIS — I10 Essential (primary) hypertension: Secondary | ICD-10-CM

## 2012-01-10 LAB — URINALYSIS, ROUTINE W REFLEX MICROSCOPIC
Bilirubin Urine: NEGATIVE
Hgb urine dipstick: NEGATIVE
Ketones, ur: NEGATIVE mg/dL
Protein, ur: NEGATIVE mg/dL
Urobilinogen, UA: 0.2 mg/dL (ref 0.0–1.0)

## 2012-01-10 MED ORDER — HYDROCHLOROTHIAZIDE 25 MG PO TABS: 25.0000 mg | ORAL_TABLET | Freq: Every day | ORAL | Status: DC

## 2012-01-10 MED ORDER — PRAVASTATIN SODIUM 40 MG PO TABS: 40.0000 mg | ORAL_TABLET | Freq: Every day | ORAL | Status: DC

## 2012-01-10 NOTE — Telephone Encounter (Signed)
Patient called again for refill on hctz and pravastatin. Will forward to his PCP for approval.  He wants Rx to be sent to [facility] on [address].

## 2012-01-11 LAB — URINE CULTURE: Organism ID, Bacteria: NO GROWTH

## 2012-02-13 ENCOUNTER — Encounter: Payer: Self-pay | Admitting: Internal Medicine

## 2012-02-16 ENCOUNTER — Other Ambulatory Visit: Payer: Self-pay | Admitting: Internal Medicine

## 2012-02-16 ENCOUNTER — Telehealth: Payer: Self-pay | Admitting: Internal Medicine

## 2012-02-16 DIAGNOSIS — E785 Hyperlipidemia, unspecified: Secondary | ICD-10-CM

## 2012-02-16 MED ORDER — PRAVASTATIN SODIUM 40 MG PO TABS: 40.0000 mg | ORAL_TABLET | Freq: Every day | ORAL | Status: DC

## 2012-02-16 NOTE — Telephone Encounter (Signed)
Pt called at 5am requesting refill of Pravastatin.  MD verified that prescription had been refille 01/09/2012 per Dr. Clyde Lundborg with 12 refills.

## 2012-02-16 NOTE — Telephone Encounter (Signed)
Pt called  AGAIN at 5am requesting refill of Pravastatin.  MD verified that prescription had been refilled 01/09/2012 per Dr. Clyde Lundborg with 12 refills and that this same MD had spoken with the pt 02/15/2012. MD re-ordered prior prescription for Pravastatin.

## 2012-04-23 ENCOUNTER — Encounter: Payer: Self-pay | Admitting: Internal Medicine

## 2012-04-24 ENCOUNTER — Encounter: Payer: Self-pay | Admitting: Internal Medicine

## 2012-04-28 ENCOUNTER — Telehealth: Payer: Self-pay | Admitting: Internal Medicine

## 2012-04-28 DIAGNOSIS — E785 Hyperlipidemia, unspecified: Secondary | ICD-10-CM

## 2012-04-28 DIAGNOSIS — I10 Essential (primary) hypertension: Secondary | ICD-10-CM

## 2012-04-28 MED ORDER — PRAVASTATIN SODIUM 40 MG PO TABS: 40.0000 mg | ORAL_TABLET | Freq: Every day | ORAL | Status: DC

## 2012-04-28 MED ORDER — HYDROCHLOROTHIAZIDE 25 MG PO TABS: 25.0000 mg | ORAL_TABLET | Freq: Every day | ORAL | Status: DC

## 2012-04-28 NOTE — Telephone Encounter (Signed)
  INTERNAL MEDICINE RESIDENCY PROGRAM After-Hours Telephone Call    Reason for call:   I received a call from [name] at 8:30  AM indicating medication refill.    Pertinent Data:   The patient called to state he had run out of his HCTZ and Pravastatin, and needed a refill.  He also notes back pain for the last 2 days, described as lower back pain radiating to his side, with no abdominal pain, nausea, vomiting, dysuria, fevers, or bowel/bladder incontinence.  He took 2 advil, which helped the pain.    Assessment / Plan / Recommendations:   The patient's HCTZ and pravastatin were sent in.  The patient's back pain has no red flag features.  I informed the patient I would notify the front desk to call and make him an appointment for evaluation of his back pain in [facility].  As always, pt is advised that if symptoms worsen or new symptoms arise, they should go to an urgent care facility or to to ER for further evaluation.    Linward Headland, MD   04/28/2012, 8:34 AM

## 2012-05-14 ENCOUNTER — Encounter: Payer: Self-pay | Admitting: Internal Medicine

## 2012-05-14 ENCOUNTER — Ambulatory Visit (INDEPENDENT_AMBULATORY_CARE_PROVIDER_SITE_OTHER): Payer: Self-pay | Admitting: Internal Medicine

## 2012-05-14 VITALS — BP 141/87 | HR 88 | Temp 97.8°F | Ht 67.0 in | Wt 184.7 lb

## 2012-05-14 DIAGNOSIS — I10 Essential (primary) hypertension: Secondary | ICD-10-CM

## 2012-05-14 DIAGNOSIS — E785 Hyperlipidemia, unspecified: Secondary | ICD-10-CM

## 2012-05-14 DIAGNOSIS — D126 Benign neoplasm of colon, unspecified: Secondary | ICD-10-CM

## 2012-05-14 DIAGNOSIS — Z86010 Personal history of colonic polyps: Secondary | ICD-10-CM

## 2012-05-14 LAB — LIPID PANEL
HDL: 34 mg/dL — ABNORMAL LOW (ref >39)
LDL Cholesterol: 88 mg/dL (ref 0–99)
Total CHOL/HDL Ratio: 4.1 Ratio

## 2012-05-14 NOTE — Assessment & Plan Note (Signed)
BP Readings from Last 3 Encounters:  05/14/12 141/87  01/09/12 130/88  11/21/11 134/91    Lab Results  Component Value Date   NA 138 11/21/2011   K 3.9 11/21/2011   CREATININE 1.34 11/21/2011    Assessment: Blood pressure control:   mildly elevated systolic blood pressure Progress toward BP goal:    mild worsening Comments:   Plan: Medications:  continue current medications, Continue HCTZ 25. Educational resources provided:   Self management tools provided:   Other plans: No new changes in medication.

## 2012-05-14 NOTE — Progress Notes (Signed)
INTERNAL MEDICINE TEACHING ATTENDING ADDENDUM: I discussed this case with Dr. Patel soon after the patient visit. I have read the documentation and I agree with the plan of care. Please see the resident note for details of management.  

## 2012-05-14 NOTE — Assessment & Plan Note (Signed)
Next colonoscopy due in October 2017. Information given to the patient.

## 2012-05-14 NOTE — Assessment & Plan Note (Signed)
Last LDL 117 in December 2012.  On Pravachol 40 mg daily. Continue that for now.  Check lipid profile today.

## 2012-05-14 NOTE — Progress Notes (Signed)
  Subjective:    Patient ID: Dylan Lane, male    DOB: Nov 21, 1950, 62 y.o.   MRN: [id number]  HPI patient is a pleasant 62 year old man with hypertension, hyper lipidemia and other problems as per problem list who comes the clinic for regular followup visit.  He denies any new symptoms. Denies any fever, chills, nausea, vomiting, abdominal pain, chest pain, short of breath, diarrhea, headache, palpitations.  He called the on-call pager recently for prescription refill and had some back pain at that time- which prompted him to get an appointment here. He said that the pain was probably from gas and constipation because as soon as he got a good bowel movement the pain resolved. He also did some I would-which might have helped too.  He was wondering when he has to get his next colonoscopy. His last colonoscopy was in October 2012 which removed 1 sessile polyp which turned out to be tubular adenoma per pathology.  Review of Systems    as per history of present illness. Objective:   Physical Exam  General: NAD HEENT: PERRL, EOMI, no scleral icterus Cardiac: S1, S2, RRR, no rubs, murmurs or gallops Pulm: clear to auscultation bilaterally, moving normal volumes of air Abd: soft, nontender, nondistended, BS present Ext: warm and well perfused, no pedal edema Neuro: alert and oriented X3, cranial nerves II-XII grossly intact       Assessment & Plan:

## 2012-05-14 NOTE — Patient Instructions (Signed)
Please make a followup appointment in 8-12 months.  If anything comes up meanwhile call the clinic for early appointment.  Always call the clinic for medication refills if any.  Your next colonoscopy will be due in October 2017.  Continue taking aspirin, HCTZ and Pravachol.  Will check her cholesterol today.

## 2012-05-26 ENCOUNTER — Telehealth: Payer: Self-pay | Admitting: Internal Medicine

## 2012-05-26 NOTE — Telephone Encounter (Signed)
Dylan Lane called at [facility] on 05/26/12.  He states that his left ankle/foot has been swelling x 1 day in duration.  He has some tenderness and mild warmth but denies any erythema or drainage, fever, chills or SOB/chest pain.  He is not sure if he accidentally hit his foot prior to this.  He states that he soaked in Epsom salt and it helped.  I advised patient to elevate his left leg, icepack, and Tylenol PRN pain.  Of note, his Cr is on the upper limit so I advised him to avoid chronic NSAIDs.  Patient was instructed to follow up in clinic/ED if worsen.

## 2012-07-10 ENCOUNTER — Telehealth: Payer: Self-pay | Admitting: Internal Medicine

## 2012-07-10 NOTE — Telephone Encounter (Signed)
The patient calls in and states that he is having some dizziness when he is standing. He has not fallen and has been standing up more slowly. He would like an appointment next Monday morning although states any morning would be fine.   He was advised if the feeling got worse or changed that he should seek further medical advise by calling back, calling 911, or seeking urgent medical attention.  Genella Mech 7:02 AM 07/10/2012

## 2012-07-16 ENCOUNTER — Ambulatory Visit: Payer: BC Managed Care – PPO | Admitting: Internal Medicine

## 2012-09-08 ENCOUNTER — Telehealth: Payer: Self-pay | Admitting: Internal Medicine

## 2012-09-08 NOTE — Telephone Encounter (Signed)
The problem started Thursday. He is getting dizzy when he bends over. It resolved when he stands or sits up. He is worried about his medicines causing this. He denied chest pain, SOB, falls. He is able to manage his daily life with this problem without problems.   He was advised to stop HCTZ until he can be evaluated in the clinic.  This provider notes that she received a similar phone call from him regarding dizziness although he denies he has ever had a similar problem.   He seems to have no showed to his last clinic visit so reiterated the importance of coming back to clinic and will forward to front desk to schedule him a week within the week.   Genella Mech 7:14 AM 09/08/2012

## 2012-09-10 ENCOUNTER — Telehealth: Payer: Self-pay | Admitting: *Deleted

## 2012-09-10 NOTE — Telephone Encounter (Signed)
Dr. Dorise Hiss spoke with the patient over the weekend and recommended that he follow up with clinic and stop hctz in the meantime. She sent a message to schedule this f/u.

## 2012-09-10 NOTE — Telephone Encounter (Signed)
Pt calls and states he is experiencing dizziness again, this is ongoing since July, missed an early July appt to address this, states he spoke w/ a nurse that was on call for the clinic this weekend and she told him to stop hctz- i think it was dr Glendell Docker. None the less he is given an appt at 0815 wed w/ dr Yetta Barre and ask not to drive or operate machinery until then and to seek help in ED if he becomes worse, drink plenty of fluids, water, and ask to rise slowly and not to make sudden moves until he is seen. He is agreeable and states he is not taking the hctz at this time.

## 2012-09-11 ENCOUNTER — Encounter: Payer: Self-pay | Admitting: Internal Medicine

## 2012-09-11 ENCOUNTER — Ambulatory Visit (INDEPENDENT_AMBULATORY_CARE_PROVIDER_SITE_OTHER): Payer: BC Managed Care – PPO | Admitting: Internal Medicine

## 2012-09-11 VITALS — BP 133/92 | HR 83 | Temp 97.1°F | Wt 193.2 lb

## 2012-09-11 DIAGNOSIS — I10 Essential (primary) hypertension: Secondary | ICD-10-CM

## 2012-09-11 DIAGNOSIS — R42 Dizziness and giddiness: Secondary | ICD-10-CM

## 2012-09-11 LAB — COMPLETE METABOLIC PANEL WITH GFR
ALT: 15 U/L (ref 0–53)
Albumin: 4.1 g/dL (ref 3.5–5.2)
Alkaline Phosphatase: 70 U/L (ref 39–117)
CO2: 29 mEq/L (ref 19–32)
GFR, Est Non African American: 62 mL/min
Glucose, Bld: 80 mg/dL (ref 70–99)
Potassium: 3.9 mEq/L (ref 3.5–5.3)
Sodium: 140 mEq/L (ref 135–145)
Total Bilirubin: 0.4 mg/dL (ref 0.3–1.2)
Total Protein: 6.7 g/dL (ref 6.0–8.3)

## 2012-09-11 LAB — CBC
MCHC: 35.1 g/dL (ref 30.0–36.0)
MCV: 83.7 fL (ref 78.0–100.0)
Platelets: 199 10*3/uL (ref 150–400)
RDW: 14.1 % (ref 11.5–15.5)
WBC: 5.1 10*3/uL (ref 4.0–10.5)

## 2012-09-11 NOTE — Progress Notes (Signed)
Patient ID: Dylan Lane, male   DOB: 01/14/1950, 62 y.o.   MRN: [id number]   Subjective:   Patient ID: Dylan Lane male   DOB: [birth year] 62 y.o.   MRN: [id number]  HPI: [name] is a 62 y.o. male past medical history hypertension, hyperlipidemia. He presents to the clinic today with a chief complaint of dizziness. He reports that he first started to have this complaint last Thursday when he noticed standing up after tying his shoes made him feel swimmy in his head and his vision became slightly blurred. He continued to notice this Friday and also reported that it took him a long time get up from bed without experiencing a similar feeling. He called the on-call resident who instructed him to stop taking hydrochlorothiazide and come in for an appointment. Since stopping HCTZ on Friday he reports improvement in symptoms. Patient reports that the dizziness is not vertigo.    Past Medical History  Diagnosis Date  . Hyperlipidemia   . Hypertension   . Rectal bleeding     History of  . Pre-diabetes     with fasting glucose of 110(2/09)  . Colon polyps     3/06 and 2009:needs repeat in 3 yrs(3/12)   Current Outpatient Prescriptions  Medication Sig Dispense Refill  . aspirin 81 MG EC tablet Take 81 mg by mouth daily.        . hydrochlorothiazide (HYDRODIURIL) 25 MG tablet Take 1 tablet (25 mg total) by mouth daily.  30 tablet  11  . pravastatin (PRAVACHOL) 40 MG tablet Take 1 tablet (40 mg total) by mouth daily.  30 tablet  11   No current facility-administered medications for this visit.   Family History  Problem Relation Age of Onset  . Colon cancer Neg Hx   . Rectal cancer Neg Hx   . Stomach cancer Neg Hx    History   Social History  . Marital Status: Single    Spouse Name: N/A    Number of Children: N/A  . Years of Education: N/A   Social History Main Topics  . Smoking status: Former Smoker    Types: Cigarettes    Quit date: 03/14/2000  . Smokeless tobacco:  None  . Alcohol Use: No  . Drug Use: No  . Sexual Activity: None   Other Topics Concern  . None   Social History Narrative   Lives with brother in [place]. Pt endorses a homosexual relationshup with HIV partner+. Pt aware of risks but says condoms are always used for intercourse.      Financial assistance approved for 100% discount at [facility] and has [facility] card per Rudell Cobb   11/16/2009            Review of Systems: Review of Systems  Constitutional: Negative for fever, chills and weight loss.  HENT: Negative for congestion.   Eyes: Positive for blurred vision.  Respiratory: Negative for cough and shortness of breath.   Cardiovascular: Negative for chest pain and leg swelling.  Gastrointestinal: Negative for heartburn, nausea, vomiting, abdominal pain, diarrhea and constipation.  Genitourinary: Negative for dysuria.  Musculoskeletal: Negative for falls.  Neurological: Positive for dizziness. Negative for weakness and headaches.  Psychiatric/Behavioral: Negative for depression.    Objective:  Physical Exam: Filed Vitals:   09/11/12 0849 09/11/12 0851 09/11/12 0852  BP: 124/80 134/80 133/92  Pulse: 72 80 83  Temp: 97.1 F (36.2 C)    TempSrc: Oral    Weight: 193 lb 3.2 oz (87.635  kg)    SpO2: 97%    Physical Exam  Nursing note and vitals reviewed. Constitutional: He is oriented to person, place, and time and well-developed, well-nourished, and in no distress. No distress.  HENT:  Head: Normocephalic and atraumatic.  Eyes: Conjunctivae are normal.  Cardiovascular: Normal rate, regular rhythm, normal heart sounds and intact distal pulses.   No murmur heard. Pulmonary/Chest: Effort normal and breath sounds normal. No respiratory distress. He has no wheezes.  Abdominal: Soft. Bowel sounds are normal. He exhibits no distension.  Musculoskeletal: He exhibits no edema.  Neurological: He is alert and oriented to person, place, and time.  Skin: Skin is warm and dry. He  is not diaphoretic.  Psychiatric: Affect and judgment normal.     Assessment & Plan:   See Problem Based Assessment and Plan

## 2012-09-11 NOTE — Patient Instructions (Signed)
1.  Stop taking HCTZ. 2.  Call if dizziness returns. 3.  Return in 3-6 weeks for recheck of BP and consider restarting a BP medication.

## 2012-09-11 NOTE — Assessment & Plan Note (Addendum)
Patient denies symptoms of vertigo. Symptoms he does report are consistent with orthostatic hypotension, he denies any syncopal or near syncopal episodes. Orthostatics done here in the office were negative. However since patient has improved after stopping the HCTZ, that was likely the cause.  As patient has never had severe hypertension will hold HCTZ at this time. And have patient followup in a few weeks to reassess blood pressure. Will obtain CBC and CMP at this time.

## 2012-09-12 ENCOUNTER — Ambulatory Visit: Payer: BC Managed Care – PPO | Admitting: Internal Medicine

## 2012-09-14 NOTE — Progress Notes (Signed)
I saw and evaluated the patient.  I personally confirmed the key portions of the history and exam documented by Dr. Hoffman and I reviewed pertinent patient test results.  The assessment, diagnosis, and plan were formulated together and I agree with the documentation in the resident's note. 

## 2012-10-09 ENCOUNTER — Ambulatory Visit (INDEPENDENT_AMBULATORY_CARE_PROVIDER_SITE_OTHER): Payer: BC Managed Care – PPO | Admitting: Internal Medicine

## 2012-10-09 VITALS — BP 162/108 | HR 81 | Temp 97.2°F | Wt 190.9 lb

## 2012-10-09 DIAGNOSIS — I10 Essential (primary) hypertension: Secondary | ICD-10-CM

## 2012-10-09 MED ORDER — HYDROCHLOROTHIAZIDE 25 MG PO TABS: 12.5000 mg | ORAL_TABLET | Freq: Every day | ORAL | Status: DC

## 2012-10-09 NOTE — Progress Notes (Signed)
   Subjective:   Patient ID: Dylan Lane male   DOB: [birth year] 62 y.o.   MRN: [id number]  HPI: [name] is a 62 y.o.  Man with a pmhx of HTN and HLD who presents for BP recheck after stopping HCTZ 3 weeks ago 2/2 orthostatic symptoms. After stopping his HCTZ, he has had no episodes of orthostatic symptoms. He denies any other problems. He is concerned and wants to make sure his BP is treated.    Past Medical History  Diagnosis Date  . Hyperlipidemia   . Hypertension   . Rectal bleeding     History of  . Pre-diabetes     with fasting glucose of 110(2/09)  . Colon polyps     3/06 and 2009:needs repeat in 3 yrs(3/12)   Current Outpatient Prescriptions  Medication Sig Dispense Refill  . aspirin 81 MG EC tablet Take 81 mg by mouth daily.        . pravastatin (PRAVACHOL) 40 MG tablet Take 1 tablet (40 mg total) by mouth daily.  30 tablet  11   No current facility-administered medications for this visit.   Family History  Problem Relation Age of Onset  . Colon cancer Neg Hx   . Rectal cancer Neg Hx   . Stomach cancer Neg Hx    History   Social History  . Marital Status: Single    Spouse Name: N/A    Number of Children: N/A  . Years of Education: N/A   Social History Main Topics  . Smoking status: Former Smoker    Types: Cigarettes    Quit date: 03/14/2000  . Smokeless tobacco: Not on file  . Alcohol Use: No  . Drug Use: No  . Sexual Activity: Not on file   Other Topics Concern  . Not on file   Social History Narrative   Lives with brother in [place]. Pt endorses a homosexual relationshup with HIV partner+. Pt aware of risks but says condoms are always used for intercourse.      Financial assistance approved for 100% discount at [facility] and has [facility] card per Rudell Cobb   11/16/2009            Review of Systems: A comprehensive review of systems was negative. Objective:  Physical Exam: Filed Vitals:   10/09/12 1639 10/09/12 1640  BP: 145/98  162/108  Pulse: 78 81  Temp: 97.2 F (36.2 C)   TempSrc: Oral   Weight: 190 lb 14.4 oz (86.592 kg)   SpO2: 99%    Physical Exam  Constitutional: He is oriented to person, place, and time. He appears well-developed and well-nourished. No distress.  HENT:  Head: Normocephalic.  Mouth/Throat: Oropharynx is clear and moist. No oropharyngeal exudate.  Eyes: EOM are normal. Pupils are equal, round, and reactive to light.  Cardiovascular: Normal rate, regular rhythm, normal heart sounds and intact distal pulses.  Exam reveals no friction rub.   No murmur heard. Pulmonary/Chest: Effort normal and breath sounds normal. No respiratory distress. He has no wheezes. He has no rales.  Abdominal: Soft. Bowel sounds are normal. He exhibits no distension. There is no tenderness.  Neurological: He is alert and oriented to person, place, and time.  Skin: He is not diaphoretic.  Psychiatric: He has a normal mood and affect. His behavior is normal.    Assessment & Plan:

## 2012-10-09 NOTE — Patient Instructions (Addendum)
We have decided to restart your HCTZ at half of the dose you were taking before because your blood pressure was high at 145/98 and 165/108. Please take half of a tablet once daily of the HCTZ for a total dose of 12.5 mg per day.  Please follow up with PCP in 14-18 weeks.

## 2012-10-10 ENCOUNTER — Encounter: Payer: Self-pay | Admitting: Internal Medicine

## 2012-10-10 NOTE — Assessment & Plan Note (Signed)
BP Readings from Last 3 Encounters:  10/09/12 162/108  09/11/12 133/92  05/14/12 141/87    Lab Results  Component Value Date   NA 140 09/11/2012   K 3.9 09/11/2012   CREATININE 1.26 09/11/2012    Assessment: Blood pressure control: moderately elevated Progress toward BP goal:  deteriorated Comments: Patient would like to go back on medication. He is agreable to alower dose of HCTZ.  Plan: Medications:  Restart HCTZ at lower dose (12.5 mg daily) Educational resources provided: brochure Self management tools provided: home blood pressure logbook Other plans: F/U in 3-5 weeks

## 2012-10-11 NOTE — Progress Notes (Signed)
I saw and evaluated the patient.  I personally confirmed the key portions of the history and exam documented by Dr. Komanski and I reviewed pertinent patient test results.  The assessment, diagnosis, and plan were formulated together and I agree with the documentation in the resident's note.  

## 2012-11-25 ENCOUNTER — Telehealth: Payer: Self-pay | Admitting: Internal Medicine

## 2012-11-25 NOTE — Telephone Encounter (Signed)
   Reason for call:   I received a call from Mr. Ivin Rosenbloom at 4:00  PM indicating that he was having "dripping from my penis." Pt denied any urgency, increased frequency, nocturnal urination, dysuria, or pyuria. Pt denied any gross blood or mucus or other penile discharge. No new sexual partners. No other systemic symptoms of fever, chills, abdominal/chest/penile/scrotal/rectal/back pain, or nausea/vomiting/diarrhea. Pt has not had a hx of UTIs in the past. Pt has been able to take all meds and not felt dizzy or lightheaded. Pt good po intake. Pt was warned of the warning symptoms of fever/chills/sweats, dysuria, urinary retention, abdominal pain, hematuria to present to ED for evaluation.    Pertinent Data:   Chart was reviewed including meds, pmh, and last clinic note.    Assessment / Plan / Recommendations:   Pt will be set up for a walk-in/early appt on 11/26/12 or earliest available for evaluation. Pt agreed with this plan.   As always, pt is advised that if symptoms worsen or new symptoms arise, they should go to an urgent care facility or to to ER for further evaluation.   Christen Bame, MD   11/25/2012, 3:38 PM

## 2012-11-26 ENCOUNTER — Ambulatory Visit (INDEPENDENT_AMBULATORY_CARE_PROVIDER_SITE_OTHER): Payer: BC Managed Care – PPO | Admitting: Internal Medicine

## 2012-11-26 ENCOUNTER — Encounter: Payer: Self-pay | Admitting: Internal Medicine

## 2012-11-26 VITALS — BP 130/84 | HR 87 | Temp 96.9°F | Ht 67.0 in | Wt 195.2 lb

## 2012-11-26 DIAGNOSIS — R300 Dysuria: Secondary | ICD-10-CM

## 2012-11-26 DIAGNOSIS — R350 Frequency of micturition: Secondary | ICD-10-CM

## 2012-11-26 DIAGNOSIS — N398 Other specified disorders of urinary system: Secondary | ICD-10-CM | POA: Insufficient documentation

## 2012-11-26 DIAGNOSIS — IMO0002 Reserved for concepts with insufficient information to code with codable children: Secondary | ICD-10-CM

## 2012-11-26 LAB — POCT URINALYSIS DIPSTICK
Bilirubin, UA: NEGATIVE
Blood, UA: NEGATIVE
Glucose, UA: NEGATIVE
Ketones, UA: NEGATIVE
Nitrite, UA: NEGATIVE
Spec Grav, UA: >=1.03
pH, UA: 5.5

## 2012-11-26 NOTE — Progress Notes (Signed)
  Subjective:    Patient ID: Dylan Lane, male    DOB: 04/10/1950, 62 y.o.   MRN: [id number]  HPI  Presents with complaints of "urine dribbling" and the feeling of incomplete bladder emptying.  Pt denies sex in over a year, denies pain, fever, foul smell, hematuria, erectile dysfunction, change in bowel habits or penile discharge. Declines prostate exam today as he "has to go pick someone up". No hx of prostate disorders.  Hx significant for hypertension and hyperlipidemia.     Review of Systems  Constitutional: Negative.   HENT: Negative.   Eyes: Negative.   Respiratory: Negative.   Cardiovascular: Negative.   Gastrointestinal: Negative.   Endocrine: Negative.   Genitourinary: Positive for difficulty urinating. Negative for dysuria, urgency, frequency, hematuria, flank pain, discharge, enuresis, genital sores, penile pain and testicular pain.  Allergic/Immunologic: Negative.   Neurological: Negative.   Hematological: Negative.   Psychiatric/Behavioral: Negative.        Objective:   Physical Exam  Constitutional: He appears well-developed and well-nourished.  HENT:  Head: Normocephalic and atraumatic.  Eyes: Conjunctivae and EOM are normal. Pupils are equal, round, and reactive to light.  Neck: Neck supple.  Cardiovascular: Normal rate and regular rhythm.   Pulmonary/Chest: Effort normal and breath sounds normal.  Abdominal: Soft. Bowel sounds are normal. There is no tenderness.  Genitourinary:  Patient declined prostate exam do to being in a hurry  Neurological: He is alert.  Skin: Skin is warm and dry.  Psychiatric: He has a normal mood and affect. His behavior is normal. Thought content normal.          Assessment & Plan:   See separate problem list charting:  # voiding dysfunction: urine dipstick negative, declines prostate exam today, history consistent with BPH and could likely benefit from alpha blocker -will f/u with PCP for further evaluation and Urology  referral if deemed necessary

## 2012-11-26 NOTE — Patient Instructions (Signed)
Your urine does not show signs of infection. Since you are in a rush, we will have you follow-up with your Primary doctor. At that time he will evaluate your prostate.

## 2012-11-26 NOTE — Assessment & Plan Note (Signed)
Urine dipstick without sign of infection  Will need full pelvic exam followup, evaluate for BPH As the referral to urologist

## 2012-11-28 NOTE — Assessment & Plan Note (Signed)
No burning or pain but has hesistency and feeling of incomplete voiding

## 2012-11-28 NOTE — Progress Notes (Signed)
Case discussed with Dr. Schooler soon after the resident saw the patient.  We reviewed the resident's history and exam and pertinent patient test results.  I agree with the assessment, diagnosis, and plan of care documented in the resident's note. 

## 2012-12-02 ENCOUNTER — Other Ambulatory Visit: Payer: Self-pay | Admitting: Internal Medicine

## 2012-12-02 ENCOUNTER — Telehealth: Payer: Self-pay | Admitting: Sports Medicine

## 2012-12-02 DIAGNOSIS — N398 Other specified disorders of urinary system: Secondary | ICD-10-CM

## 2012-12-02 MED ORDER — TAMSULOSIN HCL 0.4 MG PO CAPS: 0.4000 mg | ORAL_CAPSULE | Freq: Every day | ORAL | Status: DC

## 2012-12-02 NOTE — Progress Notes (Signed)
  INTERNAL MEDICINE RESIDENCY PROGRAM After-Hours Telephone Call    Reason for call:   I received a call from [name] at 3:08 PM, 12/02/2012 indicating he was distressed about ongoing incontinence of urine. He reported that this issue was addressed at his clinic visit on Monday. There was a note for consideration of referral to urologist. He denies other UTI symptoms. Pt was requesting for some treatment since incontinence is disrupting his social life.     Pertinent Data:   None     Assessment / Plan / Recommendations:   Recommended referral to urologist   Started pt on Flomax 0.4 mg QHS. Discussed with patient about side effects of the medications.   Follow with PCP on 01/15/2012  As always, pt is advised that if symptoms worsen or new symptoms arise, they should go to an urgent care facility or to to ER for further evaluation.    Dow Adolph, MD   12/02/2012, 3:08 PM

## 2012-12-02 NOTE — Telephone Encounter (Signed)
Erroneous page to [facility] after hours line.  Discussed with operator who will page on call for Internal Medicine Out Pt clinic.  Andrena Mews, DO [facility] Resident - PGY-3 12/02/2012 1:32 PM

## 2012-12-03 ENCOUNTER — Telehealth: Payer: Self-pay | Admitting: Internal Medicine

## 2012-12-03 NOTE — Telephone Encounter (Signed)
Accidentally opened.

## 2012-12-28 ENCOUNTER — Telehealth: Payer: Self-pay | Admitting: Internal Medicine

## 2012-12-28 NOTE — Telephone Encounter (Signed)
He on HCTZ.  He has a cold and is going to take an Alkaseltzer cold and cough and bottle says if you are on those medications call your doctor 1st.  He reports sx's with yellow phelgm this am, denies fever, chills, sick contacts with nephew and brother, denies sob, and chest pain.  Advised to go to ED or clinic if not improving  Shirlee Latch MD

## 2013-01-01 ENCOUNTER — Telehealth: Payer: Self-pay | Admitting: Internal Medicine

## 2013-01-01 DIAGNOSIS — J069 Acute upper respiratory infection, unspecified: Secondary | ICD-10-CM

## 2013-01-01 DIAGNOSIS — R05 Cough: Secondary | ICD-10-CM | POA: Insufficient documentation

## 2013-01-01 MED ORDER — GUAIFENESIN-DM 100-10 MG/5ML PO SYRP: 5.0000 mL | ORAL_SOLUTION | ORAL | Status: DC | PRN

## 2013-01-01 NOTE — Telephone Encounter (Signed)
  INTERNAL MEDICINE RESIDENCY PROGRAM After-Hours Telephone Call    Reason for call:   I placed an outgoing call to [name] at 720 pm. Patient states that he started go have nasal congestion, mild productive cough with whitish/yellowish sputum 4 days ago. Denies fever, chills, headache, ear aching, sore throat, SOB, chest pain/pressure, nausea, vomiting, abdominal pain, dysuria, urinary frequency or urgency dizziness or weakness. Denies sick contact. He did not have influenza vaccine this year.         He bought OTC med and spelled the name on the med box which is Acetominophen 325. He would like to know if this med can treat his cough.      Last encounter / Pertinent Data:   Last seen in Clinic by Dr. Bosie Lane for evaluation of frequent urination.      Assessment/ Plan:  Assessment The clinical presentation is suggestive of viral upper respiratory infection.  The influenza infection is unlikely.  Plan 1. Patient is instructed to drink fluid and rest well. 2. he may use acetaminophen 650 mg by mouth every 6 hours as needed for pain or fever 3. Will send Robitussin DM 5ml Q6 PRN for cough 4. As always, pt is advised that if symptoms worsen or new symptoms arise, they should go to an urgent care facility or to to ER for further evaluation.    Dylan Query, MD   01/01/2013, 7:23 PM

## 2013-01-05 ENCOUNTER — Telehealth: Payer: Self-pay | Admitting: Internal Medicine

## 2013-01-05 NOTE — Telephone Encounter (Signed)
Pt having cough with yellow phelgm since 12/25/12.  Denies fever, chills, leg swelling, chest pain, sob, wheezing, states his nephew had a cold.  He is taking Robitussin DM cough goes away and then comes back.  Denies history of smoking currently last smoked 12 years ago for 12 years.  This could be upper resp. Infection.  Other things in differential could be allergies, GERD,  He is not on an ACEI.  Consider pfts in the future with h/o smoking.  Advised pt to call clinic Monday to make appt or go to the ED sooner if he is not feeling well.  Keep taking cough syrup.   Aundra Dubin MD

## 2013-01-07 ENCOUNTER — Ambulatory Visit: Payer: BC Managed Care – PPO | Admitting: Internal Medicine

## 2013-01-14 ENCOUNTER — Ambulatory Visit (INDEPENDENT_AMBULATORY_CARE_PROVIDER_SITE_OTHER): Payer: BC Managed Care – PPO | Admitting: Internal Medicine

## 2013-01-14 ENCOUNTER — Ambulatory Visit (HOSPITAL_COMMUNITY)
Admission: RE | Admit: 2013-01-14 | Discharge: 2013-01-14 | Disposition: A | Discharge status: Home / Self Care | Payer: BC Managed Care – PPO | Source: Ambulatory Visit | Attending: Internal Medicine | Admitting: Internal Medicine

## 2013-01-14 ENCOUNTER — Encounter: Payer: Self-pay | Admitting: Internal Medicine

## 2013-01-14 VITALS — BP 127/85 | HR 98 | Temp 97.0°F | Ht 67.0 in | Wt 191.7 lb

## 2013-01-14 DIAGNOSIS — J069 Acute upper respiratory infection, unspecified: Secondary | ICD-10-CM

## 2013-01-14 DIAGNOSIS — I10 Essential (primary) hypertension: Secondary | ICD-10-CM

## 2013-01-14 DIAGNOSIS — R05 Cough: Secondary | ICD-10-CM

## 2013-01-14 DIAGNOSIS — K219 Gastro-esophageal reflux disease without esophagitis: Secondary | ICD-10-CM

## 2013-01-14 DIAGNOSIS — R059 Cough, unspecified: Secondary | ICD-10-CM | POA: Insufficient documentation

## 2013-01-14 MED ORDER — AMLODIPINE BESYLATE 5 MG PO TABS: 5.0000 mg | ORAL_TABLET | Freq: Every day | ORAL | Status: DC

## 2013-01-14 MED ORDER — PANTOPRAZOLE SODIUM 40 MG PO TBEC: 40.0000 mg | DELAYED_RELEASE_TABLET | Freq: Every day | ORAL | Status: DC

## 2013-01-14 NOTE — Assessment & Plan Note (Signed)
A: The patients cough is of unknown etiology at this time. Possible causes include recent URI, post-nasal drip, GERD, PNA, and COPD. I believe CHF to be less likely given no JVD, LE edema, or weight changes. Furthermore, the patient does not have CP or SOB.  P: I recommended the patient start a trial of PPI given worsening GERD symptoms. Further I rec elevating the head of the bed with blocks by 3-4 inches. Lastly, I recommended to not eat at least 1 hour before lying flat. In order to rule out more concerning etiologies such as PNA, a CXR was ordered. This did not demonstrate an acute process. If the patient fails to improve on PPI and cessation of HCTZ, I plan to order PFTs.

## 2013-01-14 NOTE — Progress Notes (Signed)
Subjective:   Patient ID: Dylan Lane male   DOB: 1950-08-03 63 y.o.   MRN: [id number]  HPI: [name] is a 63 y.o. man who comes in with a cc of cough. The cough started about 3-4 weeks ago. Its onset was slow and over a few days. The cough is worse at night and at church. The cough is productive of clear sputum. The patient initially had a sick contact, but denies fever chills and congestion. The patient denies chest pain, SOB, exertional dyspnea, weakness, fatigue. Further he denies LE edema and weight changes.     Past Medical History  Diagnosis Date  . Hyperlipidemia   . Hypertension   . Rectal bleeding     History of  . Pre-diabetes     with fasting glucose of 110(2/09)  . Colon polyps     3/06 and 2009:needs repeat in 3 yrs(3/12)   Current Outpatient Prescriptions  Medication Sig Dispense Refill  . aspirin 81 MG EC tablet Take 81 mg by mouth daily.        Marland Kitchen guaiFENesin-dextromethorphan (ROBITUSSIN DM) 100-10 MG/5ML syrup Take 5 mLs by mouth every 4 (four) hours as needed for cough.  118 mL  0  . hydrochlorothiazide (HYDRODIURIL) 25 MG tablet Take 0.5 tablets (12.5 mg total) by mouth daily.  30 tablet  12  . pravastatin (PRAVACHOL) 40 MG tablet Take 1 tablet (40 mg total) by mouth daily.  30 tablet  11  . tamsulosin (FLOMAX) 0.4 MG CAPS capsule Take 1 capsule (0.4 mg total) by mouth daily after supper.  30 capsule  1   No current facility-administered medications for this visit.   Family History  Problem Relation Age of Onset  . Colon cancer Neg Hx   . Rectal cancer Neg Hx   . Stomach cancer Neg Hx    History   Social History  . Marital Status: Single    Spouse Name: N/A    Number of Children: N/A  . Years of Education: N/A   Social History Main Topics  . Smoking status: Former Smoker    Types: Cigarettes    Quit date: 03/14/2000  . Smokeless tobacco: None  . Alcohol Use: No  . Drug Use: No  . Sexual Activity: None   Other Topics Concern  . None    Social History Narrative   Lives with brother in [place]. Pt endorses a homosexual relationshup with HIV partner+. Pt aware of risks but says condoms are always used for intercourse.      Financial assistance approved for 100% discount at [facility] and has [facility] card per Bonna Gains   11/16/2009            Review of Systems:  Review of Systems  Constitutional: Negative for fever, chills and malaise/fatigue.  HENT: Negative for congestion and sore throat.   Eyes: Negative for blurred vision and double vision.  Respiratory: Positive for cough and sputum production. Negative for shortness of breath and wheezing.   Cardiovascular: Positive for orthopnea. Negative for chest pain, palpitations, leg swelling and PND.  Gastrointestinal: Negative for heartburn, nausea, vomiting, abdominal pain, diarrhea, constipation, blood in stool and melena.  Genitourinary: Negative for dysuria, urgency and frequency.  Musculoskeletal: Negative for myalgias.  Neurological: Negative for weakness.     Objective:  Physical Exam: Filed Vitals:   01/14/13 0836  BP: 127/85  Pulse: 98  Temp: 97 F (36.1 C)  Height: 5\' 7"  (1.702 m)  Weight: 191 lb 11.2 oz (86.955  kg)  SpO2: 98%   Physical Exam  Constitutional: He appears well-developed and well-nourished. No distress.  HENT:  Head: Normocephalic.  Mouth/Throat: Oropharynx is clear and moist. No oropharyngeal exudate.  Eyes: Conjunctivae and EOM are normal. Pupils are equal, round, and reactive to light.  Neck: Normal range of motion. Neck supple. No JVD present. No tracheal deviation present. No thyromegaly present.  Cardiovascular: Normal rate, regular rhythm, normal heart sounds and intact distal pulses.  Exam reveals no friction rub.   No murmur heard. Pulmonary/Chest: Effort normal and breath sounds normal. No respiratory distress. He has no wheezes. He has no rales. He exhibits no tenderness.  Skin: He is not diaphoretic.  Psychiatric: He has  a normal mood and affect. His behavior is normal.    Assessment & Plan:

## 2013-01-14 NOTE — Assessment & Plan Note (Signed)
BP Readings from Last 3 Encounters:  01/14/13 127/85  11/26/12 130/84  10/09/12 162/108    Lab Results  Component Value Date   NA 140 09/11/2012   K 3.9 09/11/2012   CREATININE 1.26 09/11/2012    Assessment: Blood pressure control: controlled Progress toward BP goal:  at goal Comments: The patient is concerned that his cough may be due to HCTZ. HCTZ has a rare side of effect of pneumonitis. Thus, I rec switching from HCTZ to amlodipine.  Plan: Medications:  d/c HCTZ, and start amlodipine 5 mg daily. Educational resources provided: brochure Self management tools provided: home blood pressure logbook Other plans: Will follow BP at next visit.

## 2013-01-14 NOTE — Patient Instructions (Signed)
Please take Protonix 40 mg once daily for GERD. This may help with your cough  Please stop taking HCTZ and start taking amlodipine 5 mg daily.   Please complete the chest xray. I will call you with the results if they are abnormal.  Gastroesophageal Reflux Disease, Adult Gastroesophageal reflux disease (GERD) happens when acid from your stomach flows up into the esophagus. When acid comes in contact with the esophagus, the acid causes soreness (inflammation) in the esophagus. Over time, GERD may create small holes (ulcers) in the lining of the esophagus. CAUSES   Increased body weight. This puts pressure on the stomach, making acid rise from the stomach into the esophagus.  Smoking. This increases acid production in the stomach.  Drinking alcohol. This causes decreased pressure in the lower esophageal sphincter (valve or ring of muscle between the esophagus and stomach), allowing acid from the stomach into the esophagus.  Late evening meals and a full stomach. This increases pressure and acid production in the stomach.  A malformed lower esophageal sphincter. Sometimes, no cause is found. SYMPTOMS   Burning pain in the lower part of the mid-chest behind the breastbone and in the mid-stomach area. This may occur twice a week or more often.  Trouble swallowing.  Sore throat.  Dry cough.  Asthma-like symptoms including chest tightness, shortness of breath, or wheezing. DIAGNOSIS  Your caregiver may be able to diagnose GERD based on your symptoms. In some cases, X-rays and other tests may be done to check for complications or to check the condition of your stomach and esophagus. TREATMENT  Your caregiver may recommend over-the-counter or prescription medicines to help decrease acid production. Ask your caregiver before starting or adding any new medicines.  HOME CARE INSTRUCTIONS   Change the factors that you can control. Ask your caregiver for guidance concerning weight loss,  quitting smoking, and alcohol consumption.  Avoid foods and drinks that make your symptoms worse, such as:  Caffeine or alcoholic drinks.  Chocolate.  Peppermint or mint flavorings.  Garlic and onions.  Spicy foods.  Citrus fruits, such as oranges, lemons, or limes.  Tomato-based foods such as sauce, chili, salsa, and pizza.  Fried and fatty foods.  Avoid lying down for the 3 hours prior to your bedtime or prior to taking a nap.  Eat small, frequent meals instead of large meals.  Wear loose-fitting clothing. Do not wear anything tight around your waist that causes pressure on your stomach.  Raise the head of your bed 6 to 8 inches with wood blocks to help you sleep. Extra pillows will not help.  Only take over-the-counter or prescription medicines for pain, discomfort, or fever as directed by your caregiver.  Do not take aspirin, ibuprofen, or other nonsteroidal anti-inflammatory drugs (NSAIDs). SEEK IMMEDIATE MEDICAL CARE IF:   You have pain in your arms, neck, jaw, teeth, or back.  Your pain increases or changes in intensity or duration.  You develop nausea, vomiting, or sweating (diaphoresis).  You develop shortness of breath, or you faint.  Your vomit is green, yellow, black, or looks like coffee grounds or blood.  Your stool is red, bloody, or black. These symptoms could be signs of other problems, such as heart disease, gastric bleeding, or esophageal bleeding. MAKE SURE YOU:   Understand these instructions.  Will watch your condition.  Will get help right away if you are not doing well or get worse. Document Released: 09/29/2004 Document Revised: 03/14/2011 Document Reviewed: 07/09/2010 [employer] Patient Information 2014 [place]  

## 2013-01-15 ENCOUNTER — Telehealth: Payer: Self-pay | Admitting: *Deleted

## 2013-01-15 NOTE — Telephone Encounter (Signed)
Pt seen in clinic yesterday for URI. Today he feels like cough is getting worse.  Cough is in evening and during the day.  Non-productive.  He has stopped taking Robitussin because of the new meds added during visit.  He was put on Norvasc and PPI. He still feels tired after ambulating and coughing.  Good diet and fluid intake. Denies fever, SOB.  Pt # [id number] Please call pt today to advise on use of cough med.

## 2013-01-15 NOTE — Progress Notes (Signed)
Case discussed with Dr. Komanski at the time of the visit.  We reviewed the resident's history and exam and pertinent patient test results.  I agree with the assessment, diagnosis, and plan of care documented in the resident's note.      

## 2013-01-15 NOTE — Telephone Encounter (Signed)
MD called pt.

## 2013-01-16 ENCOUNTER — Encounter (HOSPITAL_COMMUNITY): Payer: Self-pay | Admitting: Emergency Medicine

## 2013-01-16 ENCOUNTER — Emergency Department (HOSPITAL_COMMUNITY)
Admission: EM | Admit: 2013-01-16 | Discharge: 2013-01-16 | Disposition: A | Discharge status: Home / Self Care | Payer: BC Managed Care – PPO | Attending: Emergency Medicine | Admitting: Emergency Medicine

## 2013-01-16 DIAGNOSIS — Z7982 Long term (current) use of aspirin: Secondary | ICD-10-CM | POA: Insufficient documentation

## 2013-01-16 DIAGNOSIS — I10 Essential (primary) hypertension: Secondary | ICD-10-CM | POA: Insufficient documentation

## 2013-01-16 DIAGNOSIS — R059 Cough, unspecified: Secondary | ICD-10-CM | POA: Insufficient documentation

## 2013-01-16 DIAGNOSIS — Z79899 Other long term (current) drug therapy: Secondary | ICD-10-CM | POA: Insufficient documentation

## 2013-01-16 DIAGNOSIS — T464X5A Adverse effect of angiotensin-converting-enzyme inhibitors, initial encounter: Secondary | ICD-10-CM

## 2013-01-16 DIAGNOSIS — R058 Other specified cough: Secondary | ICD-10-CM

## 2013-01-16 DIAGNOSIS — Z881 Allergy status to other antibiotic agents status: Secondary | ICD-10-CM | POA: Insufficient documentation

## 2013-01-16 DIAGNOSIS — R071 Chest pain on breathing: Secondary | ICD-10-CM | POA: Insufficient documentation

## 2013-01-16 DIAGNOSIS — Z8719 Personal history of other diseases of the digestive system: Secondary | ICD-10-CM | POA: Insufficient documentation

## 2013-01-16 DIAGNOSIS — Z87891 Personal history of nicotine dependence: Secondary | ICD-10-CM | POA: Insufficient documentation

## 2013-01-16 DIAGNOSIS — Z860100 Personal history of colon polyps, unspecified: Secondary | ICD-10-CM | POA: Insufficient documentation

## 2013-01-16 DIAGNOSIS — T465X5A Adverse effect of other antihypertensive drugs, initial encounter: Secondary | ICD-10-CM | POA: Insufficient documentation

## 2013-01-16 DIAGNOSIS — R05 Cough: Secondary | ICD-10-CM

## 2013-01-16 DIAGNOSIS — R110 Nausea: Secondary | ICD-10-CM | POA: Insufficient documentation

## 2013-01-16 MED ORDER — HYDROCODONE-HOMATROPINE 5-1.5 MG/5ML PO SYRP: 5.0000 mL | ORAL_SOLUTION | Freq: Four times a day (QID) | ORAL | Status: DC | PRN

## 2013-01-16 NOTE — ED Notes (Signed)
The pt did not have pneumonia yesterday on his chest xray

## 2013-01-16 NOTE — ED Notes (Signed)
The pt has had a cough since before chrstmas.  He was seen at internal med clinic had a chest xray and was told to get some robitussin which he reports is not working

## 2013-01-16 NOTE — Discharge Instructions (Signed)
1. Medications: hycodan for your cough at night, usual home medications 2. Treatment: rest, drink plenty of fluids,  3. Follow Up: Please followup with your primary doctor for discussion of your diagnoses and further evaluation after today's visit;   Do not operate heavy machinery while taking your Hycodan   Cough, Adult  A cough is a reflex that helps clear your throat and airways. It can help heal the body or may be a reaction to an irritated airway. A cough may only last 2 or 3 weeks (acute) or may last more than 8 weeks (chronic).  CAUSES Acute cough:  Viral or bacterial infections. Chronic cough:  Infections.  Allergies.  Asthma.  Post-nasal drip.  Smoking.  Heartburn or acid reflux.  Some medicines.  Chronic lung problems (COPD).  Cancer. SYMPTOMS   Cough.  Fever.  Chest pain.  Increased breathing rate.  High-pitched whistling sound when breathing (wheezing).  Colored mucus that you cough up (sputum). TREATMENT   A bacterial cough may be treated with antibiotic medicine.  A viral cough must run its course and will not respond to antibiotics.  Your caregiver may recommend other treatments if you have a chronic cough. HOME CARE INSTRUCTIONS   Only take over-the-counter or prescription medicines for pain, discomfort, or fever as directed by your caregiver. Use cough suppressants only as directed by your caregiver.  Use a cold steam vaporizer or humidifier in your bedroom or home to help loosen secretions.  Sleep in a semi-upright position if your cough is worse at night.  Rest as needed.  Stop smoking if you smoke. SEEK IMMEDIATE MEDICAL CARE IF:   You have pus in your sputum.  Your cough starts to worsen.  You cannot control your cough with suppressants and are losing sleep.  You begin coughing up blood.  You have difficulty breathing.  You develop pain which is getting worse or is uncontrolled with medicine.  You have a fever. MAKE  SURE YOU:   Understand these instructions.  Will watch your condition.  Will get help right away if you are not doing well or get worse. Document Released: 06/18/2010 Document Revised: 03/14/2011 Document Reviewed: 06/18/2010 [facility] Patient Information 2014 [place].
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
## 2013-01-17 NOTE — ED Provider Notes (Signed)
Medical screening examination/treatment/procedure(s) were performed by non-physician practitioner and as supervising physician I was immediately available for consultation/collaboration.  Richarda Blade, MD 01/17/13 [phone]

## 2013-02-07 ENCOUNTER — Telehealth: Payer: Self-pay | Admitting: Internal Medicine

## 2013-02-07 NOTE — Telephone Encounter (Signed)
  INTERNAL MEDICINE RESIDENCY PROGRAM After-Hours Telephone Call    Reason for call:   I received a call from [name] at 7:28 AM, 02/07/2013 indicating that he is out of his pravastatin and running low on BP medication (amlodipine was started 01/14/13 by PCP).  Requesting refills.    Assessment / Plan / Recommendations:   Will forward to PCP    Othella Boyer, MD   02/07/2013, 7:28 AM

## 2013-02-12 ENCOUNTER — Encounter: Payer: BC Managed Care – PPO | Admitting: Internal Medicine

## 2013-02-12 ENCOUNTER — Encounter: Payer: Self-pay | Admitting: Internal Medicine

## 2013-02-14 ENCOUNTER — Other Ambulatory Visit: Payer: Self-pay | Admitting: Internal Medicine

## 2013-03-02 ENCOUNTER — Telehealth: Payer: Self-pay | Admitting: Internal Medicine

## 2013-03-02 NOTE — Telephone Encounter (Signed)
The patient called in and is taking his cough syrup but feels like he is getting a headache in the morning and he is too sleepy. He wants to know if it is safe to decrease the amount that he takes. He also has gone off of his stomach medicine and noticed that his cough was getting worse. He restarted the medicine today and noticed a slight tingling sensation in his arm with the medicine and he's not sure if it's related. He was very concerned. I advised him to decide if the coughing or tingling was more bothersome. If the coughing was more bothersome he should restart the stomach medicine and use the cough medicine at half dose only when needed. If the tingling was more bothersome he should stop the stomach medicine and use the cough syrup at half dose only as needed. He agreed with the plan. He wanted to know if his PCP could call him and I agreed to inform his PCP but did not promise that he would call the patient back.  He was advised if the coughing was insistent or he could not breathe that he should seek medical advice or call again. He agreed.   Vertell Novak 7:49 PM 03/02/2013

## 2013-03-09 ENCOUNTER — Telehealth: Payer: Self-pay | Admitting: Internal Medicine

## 2013-03-09 DIAGNOSIS — J069 Acute upper respiratory infection, unspecified: Secondary | ICD-10-CM

## 2013-03-09 DIAGNOSIS — E785 Hyperlipidemia, unspecified: Secondary | ICD-10-CM

## 2013-03-09 MED ORDER — PANTOPRAZOLE SODIUM 40 MG PO TBEC: 40.0000 mg | DELAYED_RELEASE_TABLET | Freq: Every day | ORAL | Status: DC

## 2013-03-09 MED ORDER — PRAVASTATIN SODIUM 40 MG PO TABS: 40.0000 mg | ORAL_TABLET | Freq: Every day | ORAL | Status: DC

## 2013-03-09 NOTE — Telephone Encounter (Signed)
[phone]. Called because Pravastatin, Protonix are out goes to [facility] on [address]. Will send both Rx to pharmacy  He noted when he stopped taking Protonix he started coughing and when he resumed the coughing stopped.    Aundra Dubin MD

## 2013-03-29 ENCOUNTER — Telehealth: Payer: Self-pay | Admitting: Internal Medicine

## 2013-03-29 NOTE — Telephone Encounter (Signed)
  INTERNAL MEDICINE RESIDENCY PROGRAM After-Hours Telephone Call    Reason for call:   I placed an outgoing call to [name] at 5 am.  Patient requests the refill for protonix. He is instructed that his refill was sent to [place] on 03/09/13. Patient is instructed to pick up his meds at the pharmacy.    Charlann Lange, MD   03/29/2013, 5:05 AM

## 2013-04-06 ENCOUNTER — Telehealth: Payer: Self-pay | Admitting: Internal Medicine
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
## 2013-04-15 ENCOUNTER — Telehealth: Payer: Self-pay | Admitting: Internal Medicine

## 2013-04-16 NOTE — Telephone Encounter (Signed)
  INTERNAL MEDICINE RESIDENCY PROGRAM After-Hours Telephone Call    Reason for call:   I received a call from [name] at 3:49 AM, 04/16/2013 indicating that his cough has not improved since starting Robutusin. He denies chest pain, shortness of breath, fevers, or increased fatigue. He describes his cough was productive of clear white sputum.     Pertinent Data:   He was evaluated in clinic back in January 2015 for cough, thought to be related to GERD.    Assessment / Plan / Recommendations:   I advised the patient to call the clinic in the morning and schedule an appointment to be evaluated further. Patient verbalized understanding of these instructions.  As always, pt is advised that if symptoms worsen or new symptoms arise, they should go to an urgent care facility or to to ER for further evaluation.    Jessee Avers, MD   04/16/2013, 3:49 AM

## 2013-05-11 ENCOUNTER — Telehealth: Payer: Self-pay | Admitting: Internal Medicine

## 2013-05-11 ENCOUNTER — Other Ambulatory Visit: Payer: Self-pay | Admitting: Internal Medicine

## 2013-05-11 DIAGNOSIS — E785 Hyperlipidemia, unspecified: Secondary | ICD-10-CM

## 2013-05-11 MED ORDER — PRAVASTATIN SODIUM 40 MG PO TABS: 40.0000 mg | ORAL_TABLET | Freq: Every day | ORAL | Status: DC

## 2013-05-11 NOTE — Telephone Encounter (Signed)
   Reason for call:   I received a call from [name] at 6:15 AM on Sat, 05/11/2013 indicating that he needed refills on Pravastatin.   Pertinent Data:   EPIC chart reviewed   Assessment / Plan / Recommendations:   Pt had refill of Pravastatin sent in 03/09/2013 with 11 refills.    Jeralene Huff, MD   05/11/2013, 6:27 AM

## 2013-05-26 ENCOUNTER — Telehealth: Payer: Self-pay | Admitting: Internal Medicine

## 2013-05-26 NOTE — Telephone Encounter (Signed)
C/o left breast feeling like cramp with lifting left arm.  He thinks it may be a muscle spasm, denies CP.  This started when he woke up. Denies sob, nausea, vomiting.  Advised if this continues come to the Emergency Room.    Aundra Dubin MD

## 2013-06-03 ENCOUNTER — Telehealth: Payer: Self-pay | Admitting: Internal Medicine

## 2013-06-03 NOTE — Telephone Encounter (Signed)
Patient called with complaints of left? Foot swelling since he a day prior.  Did not contact [name] thinking that swelling may go down with Epsom salt soak.  No pain, no tingling, no trauma.  Advised that MD cannot evaluate via telephone and that he would need to be examined.  Pt declines UC/ED evaluation and will call clinic tomorrow morning for f/u.

## 2013-06-04 ENCOUNTER — Other Ambulatory Visit: Payer: Self-pay | Admitting: Internal Medicine

## 2013-06-05 ENCOUNTER — Ambulatory Visit (INDEPENDENT_AMBULATORY_CARE_PROVIDER_SITE_OTHER): Payer: BC Managed Care – PPO | Admitting: Internal Medicine

## 2013-06-05 ENCOUNTER — Ambulatory Visit (HOSPITAL_COMMUNITY)
Admission: RE | Admit: 2013-06-05 | Discharge: 2013-06-05 | Disposition: A | Discharge status: Home / Self Care | Payer: BC Managed Care – PPO | Source: Ambulatory Visit | Attending: Internal Medicine | Admitting: Internal Medicine

## 2013-06-05 ENCOUNTER — Encounter: Payer: Self-pay | Admitting: Internal Medicine

## 2013-06-05 VITALS — BP 139/91 | HR 83 | Temp 98.1°F | Wt 192.6 lb

## 2013-06-05 DIAGNOSIS — Z7251 High risk heterosexual behavior: Secondary | ICD-10-CM

## 2013-06-05 DIAGNOSIS — M7989 Other specified soft tissue disorders: Secondary | ICD-10-CM

## 2013-06-05 DIAGNOSIS — R7309 Other abnormal glucose: Secondary | ICD-10-CM

## 2013-06-05 DIAGNOSIS — K219 Gastro-esophageal reflux disease without esophagitis: Secondary | ICD-10-CM | POA: Insufficient documentation

## 2013-06-05 DIAGNOSIS — I10 Essential (primary) hypertension: Secondary | ICD-10-CM

## 2013-06-05 DIAGNOSIS — Z7252 High risk homosexual behavior: Secondary | ICD-10-CM | POA: Insufficient documentation

## 2013-06-05 DIAGNOSIS — R7303 Prediabetes: Secondary | ICD-10-CM | POA: Insufficient documentation

## 2013-06-05 DIAGNOSIS — X58XXXA Exposure to other specified factors, initial encounter: Secondary | ICD-10-CM

## 2013-06-05 DIAGNOSIS — S92253A Displaced fracture of navicular [scaphoid] of unspecified foot, initial encounter for closed fracture: Secondary | ICD-10-CM

## 2013-06-05 DIAGNOSIS — Z0000 Encounter for general adult medical examination without abnormal findings: Secondary | ICD-10-CM

## 2013-06-05 LAB — CBC
HEMATOCRIT: 42.2 % (ref 39.0–52.0)
HEMOGLOBIN: 15.2 g/dL (ref 13.0–17.0)
MCH: 29.1 pg (ref 26.0–34.0)
MCHC: 36 g/dL (ref 30.0–36.0)
MCV: 80.8 fL (ref 78.0–100.0)
Platelets: 217 10*3/uL (ref 150–400)
RBC: 5.22 MIL/uL (ref 4.22–5.81)
RDW: 13.9 % (ref 11.5–15.5)
WBC: 7.3 10*3/uL (ref 4.0–10.5)

## 2013-06-05 LAB — URIC ACID: Uric Acid, Serum: 5.5 mg/dL (ref 4.0–7.8)

## 2013-06-05 LAB — HEMOGLOBIN A1C
Hgb A1c MFr Bld: 5.8 % — ABNORMAL HIGH (ref <5.7)
Mean Plasma Glucose: 120 mg/dL — ABNORMAL HIGH (ref <117)

## 2013-06-05 MED ORDER — PANTOPRAZOLE SODIUM 40 MG PO TBEC: 40.0000 mg | DELAYED_RELEASE_TABLET | Freq: Every day | ORAL | Status: DC

## 2013-06-05 MED ORDER — IBUPROFEN 600 MG PO TABS: 600.0000 mg | ORAL_TABLET | Freq: Four times a day (QID) | ORAL | Status: DC | PRN

## 2013-06-05 NOTE — Assessment & Plan Note (Addendum)
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   Plan: -Obtain HIV Ab ---> non-reactive -Encourage safe sex

## 2013-06-05 NOTE — Progress Notes (Signed)
Patient ID: Dylan Lane, male   DOB: 07-Jan-1950, 63 y.o.   MRN: [id number]   Subjective:   Patient ID: Dylan Lane male   DOB: Aug 04, 1950 63 y.o.   MRN: [id number]  HPI: [name] is a 63 y.o. very pleasant man with past medical history of hypertension, hyperlipidemia, pre-diabetes, and GERD who presents with left foot and ankle pain of 3 day duration.   Pt reports that he was in his normal state of health at church on Sunday standing as he usually does when later that day felt throbbing pain and swelling along the arch of his left foot that has since extended up to his left ankle. He also reports mild warmth without erythema. He was applying icy hot patch, wrapping it with ace bandage, and taking ibuprofen with some relief. He denies recent trauma, injury, fall, change in his shoes, new exercises, or significant weight gain. No recent fever or chills. He has been unable to fully bear weight and has consequently been limping for the past few days. He denies leg cramping or pain and has no prior history of blood clots. He denies history of prior joint pain except for in his bilateral hand joints thought to be due to osteoarthritis a very long time ago. No recent change in medications except for HCTZ to amlodipine in January of this year. He does not remember having any recent insect bites. He denies history of previous fracture.     Past Medical History  Diagnosis Date  . Hyperlipidemia   . Hypertension   . Rectal bleeding     History of  . Pre-diabetes     with fasting glucose of 110(2/09)  . Colon polyps     3/06 and 2009:needs repeat in 3 yrs(3/12)   Current Outpatient Prescriptions  Medication Sig Dispense Refill  . amLODipine (NORVASC) 5 MG tablet TAKE ONE TABLET BY MOUTH ONCE DAILY  30 tablet  6  . aspirin 81 MG EC tablet Take 81 mg by mouth daily.       Marland Kitchen guaiFENesin-dextromethorphan (ROBITUSSIN DM) 100-10 MG/5ML syrup Take 5 mLs by mouth every 4 (four) hours as needed for  cough.  118 mL  0  . HYDROcodone-homatropine (HYCODAN) 5-1.5 MG/5ML syrup Take 5 mLs by mouth every 6 (six) hours as needed for cough.  120 mL  0  . pantoprazole (PROTONIX) 40 MG tablet TAKE ONE TABLET BY MOUTH ONCE DAILY  30 tablet  12  . pravastatin (PRAVACHOL) 40 MG tablet Take 1 tablet (40 mg total) by mouth daily.  30 tablet  11   No current facility-administered medications for this visit.   Family History  Problem Relation Age of Onset  . Colon cancer Neg Hx   . Rectal cancer Neg Hx   . Stomach cancer Neg Hx    History   Social History  . Marital Status: Single    Spouse Name: N/A    Number of Children: N/A  . Years of Education: N/A   Social History Main Topics  . Smoking status: Former Smoker    Types: Cigarettes    Quit date: 03/14/2000  . Smokeless tobacco: None  . Alcohol Use: No  . Drug Use: No  . Sexual Activity: None   Other Topics Concern  . None   Social History Narrative   Lives with brother in [place]. Pt endorses a homosexual relationshup with HIV partner+. Pt aware of risks but says condoms are always used for intercourse.  Financial assistance approved for 100% discount at [facility] and has [facility] card per Bonna Gains   11/16/2009            Review of Systems: Review of Systems  Constitutional: Negative for fever, chills, weight loss and malaise/fatigue.  Respiratory: Negative for cough and shortness of breath.   Cardiovascular: Negative for chest pain and leg swelling.  Gastrointestinal: Negative for heartburn, nausea, vomiting, abdominal pain, diarrhea and constipation.  Musculoskeletal:       Left foot/ankle pain and swelling with mild warmth  Skin: Negative for rash.  Neurological: Negative for sensory change and weakness.    Objective:  Physical Exam: Filed Vitals:   06/05/13 0953  BP: 139/91  Pulse: 83  Temp: 98.1 F (36.7 C)  TempSrc: Oral  Weight: 192 lb 9.6 oz (87.363 kg)  SpO2: 97%    Physical Exam  Constitutional:  He is oriented to person, place, and time. He appears well-developed and well-nourished. No distress.  HENT:  Head: Normocephalic and atraumatic.  Eyes: Conjunctivae and EOM are normal. Right eye exhibits no discharge. Left eye exhibits no discharge. No scleral icterus.  Neck: Normal range of motion. Neck supple.  Cardiovascular: Normal rate, regular rhythm and normal heart sounds.   Pulmonary/Chest: Effort normal and breath sounds normal. No respiratory distress. He has no wheezes. He has no rales.  Abdominal: Soft. Bowel sounds are normal. He exhibits no distension. There is no tenderness. There is no rebound and no guarding.  Musculoskeletal:  Left medial foot swelling, most prominent along arch, extending to ankle with mild warmth and tenderness to palpation of bony prominence, without erythema.   Neurological: He is alert and oriented to person, place, and time.  Unable to fully bear weight on left foot  Skin: Skin is warm and dry. No rash noted. He is not diaphoretic. No erythema. No pallor.  Psychiatric: He has a normal mood and affect. His behavior is normal. Judgment and thought content normal.    Assessment & Plan:   Please see problem list for problem-based assessment and plan

## 2013-06-05 NOTE — Assessment & Plan Note (Addendum)
Assessment: Pt with non-traumatic 3 day history of left foot swelling and pain with inability to fully bear weight found to have minimally displaced avulsion fracture along the medial aspect of the left navicular. Etiology unclear as pt with no recent trauma, injury, or fall, possibly due to overuse or stress.   Plan: -Obtain stat complete XR left foot and ankle ---->  minimally displaced avulsion fracture along the medial aspect of the navicular with mild soft tissue swelling along the medial aspect of the hindfoot/ankle. -Obtain CBC ---> normal -Obtain uric acid level ---> normal -Obtain 25-OH vitamin D level, if low will start calcium and vitamin D daily supplementation -Consider DEXA scan -Talked with orthopedics on-call physician, pt to ace wrap and use crutches and f/u on 6/4  -Pt instructed to rest, ice, and bear no weight to left LE -Pt instructed to take ibuprofen 600 mg Q 6 hr PRN pain   -Pt scheduled for follow-up appointment with Dr. Berenice Primas on 6/4  ADDENDUM: 25-OH Vitamin D level 15, will prescribe 50K U Vitamin D weekly for 8 weeks and 1250 mg calcium carbonate daily. Pt to be seen back in 2 months once completion of high-dose vitamin D therapy to initiate maintenance dose therapy.  Pt also to receive DEXA scan.

## 2013-06-05 NOTE — Patient Instructions (Addendum)
-Will get xray of your left foot and ankle today and call you with the results -Will get some blood work and call you with the results -Take ibuprofen every 6 hrs as needed for pain -Please rest and elevate your foot and try to not to bear weight on it -Please come back if your symptoms do not improve -Pleasure meeting you!  General Instructions:   Please bring your medicines with you each time you come to clinic.  Medicines may include prescription medications, over-the-counter medications, herbal remedies, eye drops, vitamins, or other pills.   Progress Toward Treatment Goals:  Treatment Goal 01/14/2013  Blood pressure at goal    Self Care Goals & Plans:  Self Care Goal 01/14/2013  Manage my medications take my medicines as prescribed; refill my medications on time  Monitor my health keep track of my weight  Eat healthy foods eat foods that are low in salt; eat baked foods instead of fried foods; drink diet soda or water instead of juice or soda  Be physically active find an activity I enjoy    No flowsheet data found.   Care Management & Community Referrals:  No flowsheet data found.      Ankle Sprain An ankle sprain is an injury to the strong, fibrous tissues (ligaments) that hold your ankle bones together.  HOME CARE   Put ice on your ankle for 1 2 days or as told by your doctor.  Put ice in a plastic bag.  Place a towel between your skin and the bag.  Leave the ice on for 15-20 minutes at a time, every 2 hours while you are awake.  Only take medicine as told by your doctor.  Raise (elevate) your injured ankle above the level of your heart as much as possible for 2 3 days.  Use crutches if your doctor tells you to. Slowly put your own weight on the affected ankle. Use the crutches until you can walk without pain.  If you have a plaster splint:  Do not rest it on anything harder than a pillow for 24 hours.  Do not put weight on it.  Do not get it  wet.  Take it off to shower or bathe.  If given, use an elastic wrap or support stocking for support. Take the wrap off if your toes lose feeling (numb), tingle, or turn cold or blue.  If you have an air splint:  Add or let out air to make it comfortable.  Take it off at night and to shower and bathe.  Wiggle your toes and move your ankle up and down often while you are wearing it. GET HELP RIGHT AWAY IF:   Your toes lose feeling (numb) or turn blue.  You have severe pain that is increasing.  You have rapidly increasing bruising or puffiness (swelling).  Your toes feel very cold.  You lose feeling in your foot.  Your medicine does not help your pain. MAKE SURE YOU:   Understand these instructions.  Will watch your condition.  Will get help right away if you are not doing well or get worse. Document Released: 06/08/2007 Document Revised: 09/14/2011 Document Reviewed: 07/04/2011 [facility] Patient Information 2014 [place], [place]. Hepatitis A; Hepatitis B Vaccine injection What is this medicine? HEPATITIS A VACCINE; HEPATITIS B VACCINE (hep uh TAHY tis A vak SEEN; hep uh TAHY tis B vak SEEN) is a vaccine to protect from an infection with the hepatitis A and B virus. This vaccine does not  contain the live viruses. It will not cause a hepatitis infection. This medicine may be used for other purposes; ask your health care provider or pharmacist if you have questions. COMMON BRAND NAME(S): Twinrix What should I tell my health care provider before I take this medicine? They need to know if you have any of these conditions: -bleeding disorder -fever or infection -heart disease -immune system problems -an unusual or allergic reaction to hepatitis A or B vaccine, neomycin, yeast, thimerosal, other medicines, foods, dyes, or preservatives -pregnant or trying to get pregnant -breast-feeding How should I use this medicine? This vaccine is for injection into a muscle. It is given by  a health care professional. A copy of Vaccine Information Statements will be given before each vaccination. Read this sheet carefully each time. The sheet may change frequently. Talk to your pediatrician regarding the use of this medicine in children. Special care may be needed. Overdosage: If you think you have taken too much of this medicine contact a poison control center or emergency room at once. NOTE: This medicine is only for you. Do not share this medicine with others. What if I miss a dose? It is important not to miss your dose. Call your doctor or health care professional if you are unable to keep an appointment. What may interact with this medicine? -medicines that suppress your immune function like adalimumab, anakinra, infliximab -medicines to treat cancer -steroid medicines like prednisone or cortisone This list may not describe all possible interactions. Give your health care provider a list of all the medicines, herbs, non-prescription drugs, or dietary supplements you use. Also tell them if you smoke, drink alcohol, or use illegal drugs. Some items may interact with your medicine. What should I watch for while using this medicine? See your health care provider for all shots of this vaccine as directed. You must have 3 to 4 shots of this vaccine for protection from hepatitis A and B infection. Tell your doctor right away if you have any serious or unusual side effects after getting this vaccine. What side effects may I notice from receiving this medicine? Side effects that you should report to your doctor or health care professional as soon as possible: -allergic reactions like skin rash, itching or hives, swelling of the face, lips, or tongue -breathing problems -confused, irritated -fast, irregular heartbeat -flu-like syndrome -numb, tingling pain -seizures Side effects that usually do not require medical attention (report to your doctor or health care professional if they  continue or are bothersome): -diarrhea -fever -headache -loss of appetite -muscle pain -nausea -pain, redness, swelling, or irritation at site where injected -tiredness This list may not describe all possible side effects. Call your doctor for medical advice about side effects. You may report side effects to FDA at 1-800-FDA-1088. Where should I keep my medicine? This drug is given in a hospital or clinic and will not be stored at home. NOTE: This sheet is a summary. It may not cover all possible information. If you have questions about this medicine, talk to your doctor, pharmacist, or health care provider.  2014, Elsevier/Gold Standard. (2007-05-04 15:21:37)

## 2013-06-05 NOTE — Assessment & Plan Note (Addendum)
Assessment: Pt with last A1c of 11/23/09 of 6 with no symptoms of polyuria, polydipsia, or polyphagia.   Plan:  -Obtain A1c --> improved to 5.8 -Encourage lifestyle modification with diet and exercise -Continue to monitor

## 2013-06-05 NOTE — Assessment & Plan Note (Signed)
Assessment:  Pt with well-controlled hypertension compliant with 1-class (CCB) anti-hypertensive therapy who presents with blood pressure of 139/91.   Plan: -BP 139/91 at goal <140/90 -Continue amlodipine 5 mg daily  -Obtain BMP at next visit

## 2013-06-05 NOTE — Assessment & Plan Note (Signed)
Assessment: Pt with well-controlled acid reflux disease with improved cough after starting PPI therapy who presents with no acid reflux symptoms.   Plan:  -Continue protonix 40 mg daily  -Monitor for alarm symptoms to warrant EGD

## 2013-06-06 LAB — VITAMIN D 25 HYDROXY (VIT D DEFICIENCY, FRACTURES): Vit D, 25-Hydroxy: 15 ng/mL — ABNORMAL LOW (ref 30–89)

## 2013-06-06 LAB — HIV ANTIBODY (ROUTINE TESTING W REFLEX): HIV 1&2 Ab, 4th Generation: NONREACTIVE

## 2013-06-10 MED ORDER — CALCIUM CARBONATE 1250 (500 CA) MG PO CHEW: 1.0000 | CHEWABLE_TABLET | Freq: Every day | ORAL | Status: DC

## 2013-06-10 MED ORDER — VITAMIN D (ERGOCALCIFEROL) 1.25 MG (50000 UNIT) PO CAPS
50000.0000 [IU] | ORAL_CAPSULE | ORAL | Status: DC
Start: 2013-06-10 — End: 2013-09-18

## 2013-06-10 NOTE — Addendum Note (Signed)
Addended byJuluis Mire on: 06/10/2013 09:32 AM   Modules accepted: Orders

## 2013-06-12 ENCOUNTER — Ambulatory Visit (HOSPITAL_COMMUNITY)
Admission: RE | Admit: 2013-06-12 | Discharge: 2013-06-12 | Disposition: A | Discharge status: Home / Self Care | Payer: BC Managed Care – PPO | Source: Ambulatory Visit | Attending: Internal Medicine | Admitting: Internal Medicine

## 2013-06-12 DIAGNOSIS — Z8781 Personal history of (healed) traumatic fracture: Secondary | ICD-10-CM | POA: Insufficient documentation

## 2013-06-12 DIAGNOSIS — M7989 Other specified soft tissue disorders: Secondary | ICD-10-CM

## 2013-06-12 DIAGNOSIS — E559 Vitamin D deficiency, unspecified: Secondary | ICD-10-CM | POA: Insufficient documentation

## 2013-06-12 DIAGNOSIS — Z13820 Encounter for screening for osteoporosis: Secondary | ICD-10-CM | POA: Insufficient documentation

## 2013-06-12 DIAGNOSIS — S92253A Displaced fracture of navicular [scaphoid] of unspecified foot, initial encounter for closed fracture: Secondary | ICD-10-CM

## 2013-06-13 NOTE — Progress Notes (Signed)
I saw and evaluated the patient.  I personally confirmed the key portions of the history and exam documented by Dr. Rabbani and I reviewed pertinent patient test results.  The assessment, diagnosis, and plan were formulated together and I agree with the documentation in the resident's note.  

## 2013-06-22 ENCOUNTER — Telehealth: Payer: Self-pay | Admitting: Internal Medicine

## 2013-06-22 NOTE — Telephone Encounter (Signed)
   Reason for call:   I received a call from [name] at 7:12 PM indicating that his L foot has been more swollen recently and pain.  He initially followed up with Dr. Berenice Primas from ortho but missed his second appointment. He did not get his crutches, but has just been using a boot that he says is uncomfortable. Minimal pain relief with otc ibuprofen 200mg  q6 hours.    Pertinent Data:   He was last seen by Dr. Naaman Plummer on 06/05/13 for L foot and ankle pain and was found to have a minimally displaced avulsion fracture along medial aspect of the navicular with overlying soft tissue swelling. He was placed in an ace wrap and instructed to use crutches and follow up with Dr. Berenice Primas on 06/06/13.   He did follow up with Dr. Berenice Primas was then supposed to follow up on 6/9 but says he missed that appointment.   Low vitamin D, started on 50,000u qweekly x8 weeks and calcium carbonate  He was prescribed ibuprofen 600mg  q6h by Dr. Naaman Plummer but did not pick up the prescription.    Assessment / Plan / Recommendations:   [name] is a 63 year old male with HTN and recent L minimally displaced avulsion fracture calling for worsening pain and swelling of L foot. He missed his last ortho follow up appointment but has been using the boot he was placed in on initial appointment. However, he says it is hard to keep the boot on lately due to pain and swelling. He also did not pick up his prescribed ibuprofen from last opc visit.   Dr. Naaman Plummer and I both spoke with the patient today. He has been advised to come to urgent care or ED for further evaluation of his foot given worsening swelling and pain, which he said he will do (his brother can drive him). In the meantime, we also discussed getting his prescription filled for the ibuprofen 600mg  as prescribed, ice to the area as tolerated, using crutches to keep weight off the extremity, and follow up with ortho as soon as possible.   As always, pt is advised that if  symptoms worsen or new symptoms arise, they should go to an urgent care facility or to to ER for further evaluation. He is able to verbally relay information back to me on the phone this evening.    Dylan Pitch, MD   06/22/2013, 10:30 PM

## 2013-06-24 ENCOUNTER — Telehealth: Payer: Self-pay | Admitting: *Deleted

## 2013-06-24 ENCOUNTER — Ambulatory Visit: Payer: BC Managed Care – PPO | Admitting: Internal Medicine

## 2013-06-24 NOTE — Telephone Encounter (Signed)
No change with left foot - still swollen. Pt did not keep appt with Dr Berenice Primas 06/11/13. Appt given 06/24/13 3:45PM. Hilda Blades Ditzler RN 06/24/13 9:50AM

## 2013-07-14 ENCOUNTER — Telehealth: Payer: Self-pay | Admitting: Internal Medicine

## 2013-07-14 NOTE — Telephone Encounter (Signed)
   Reason for call:   I received a call from [name] at 300  PM indicating that he woke up feeling dizzy this AM. Mostly this sensation is when he moves from a sitting to standing position and from a bending down position to standing. It is not associated with any CP, SOB, DOE, LE edema, dysuria, pyuria, gross hematuria, numbness/weakness or other neurological symptoms. The pt has not been out in the heat, and did take his BP meds. He didn't go to church today as he was feeling to dizzy. He has only taken 2 tylenol without much improvement and had a small cup of water all day today.    Pertinent Data:   Reviewed chart and medications with the patient    Assessment / Plan / Recommendations:   Pt was advised to increase po intake and if dizziness gets worse or new symptoms arise to present to UC or Ed for evaluation.   As always, pt is advised that if symptoms worsen or new symptoms arise, they should go to an urgent care facility or to to ER for further evaluation.   Dylan Gallant, MD   07/14/2013, 3:15 PM

## 2013-07-15 ENCOUNTER — Telehealth: Payer: Self-pay | Admitting: *Deleted

## 2013-07-15 NOTE — Telephone Encounter (Signed)
Pt called since 07/14/13 - c/o of being dizzy all the time. Denies any head congestion, ear problems and is eating. Denies nausea.  Problems with transportation. Appt made 07/16/13 8:45AM Dr Aundra Dubin - pt to call clinic back if able to get transportation this PM. Hilda Blades Oneal Biglow RN 07/15/13 11:45AM

## 2013-07-15 NOTE — Telephone Encounter (Signed)
Talked with pt again 3:45PM - he went ou to the store - broke out in a sweat. Back at home feeling better. Discuss transportation - Can go to ER after hours or pt has appt 07/16/13 8:45AM. Hilda Blades Callin Ashe RN 07/15/13 3:45PM

## 2013-07-16 ENCOUNTER — Ambulatory Visit: Payer: BC Managed Care – PPO | Admitting: Internal Medicine

## 2013-07-16 ENCOUNTER — Ambulatory Visit (INDEPENDENT_AMBULATORY_CARE_PROVIDER_SITE_OTHER): Payer: BC Managed Care – PPO | Admitting: Internal Medicine

## 2013-07-16 ENCOUNTER — Encounter: Payer: Self-pay | Admitting: Internal Medicine

## 2013-07-16 VITALS — BP 133/92 | HR 99 | Temp 98.1°F | Ht 67.0 in | Wt 192.3 lb

## 2013-07-16 DIAGNOSIS — I10 Essential (primary) hypertension: Secondary | ICD-10-CM

## 2013-07-16 DIAGNOSIS — E785 Hyperlipidemia, unspecified: Secondary | ICD-10-CM

## 2013-07-16 DIAGNOSIS — R42 Dizziness and giddiness: Secondary | ICD-10-CM

## 2013-07-16 DIAGNOSIS — K219 Gastro-esophageal reflux disease without esophagitis: Secondary | ICD-10-CM

## 2013-07-16 MED ORDER — PANTOPRAZOLE SODIUM 40 MG PO TBEC: 40.0000 mg | DELAYED_RELEASE_TABLET | Freq: Every day | ORAL | Status: DC

## 2013-07-16 MED ORDER — MECLIZINE HCL 25 MG PO TABS: 12.5000 mg | ORAL_TABLET | Freq: Three times a day (TID) | ORAL | Status: DC | PRN

## 2013-07-16 MED ORDER — PRAVASTATIN SODIUM 40 MG PO TABS: 40.0000 mg | ORAL_TABLET | Freq: Every day | ORAL | Status: DC

## 2013-07-16 MED ORDER — ASPIRIN 81 MG PO TBEC: 81.0000 mg | DELAYED_RELEASE_TABLET | Freq: Every day | ORAL | Status: DC

## 2013-07-16 MED ORDER — AMLODIPINE BESYLATE 5 MG PO TABS: ORAL_TABLET | ORAL | Status: DC

## 2013-07-16 NOTE — Assessment & Plan Note (Signed)
Patient has sensation the room is spinning, no nystagmus on exam, no neuro findings  Will try Meclizine prn  RTC 1-2 weeks sooner if sx's not improving

## 2013-07-16 NOTE — Patient Instructions (Addendum)
General Instructions: Follow up in 1-2 weeks sooner if needed  Pick up medications from pharmacy    Treatment Goals:  Goals (1 Years of Data) as of 07/16/13         As of Today As of Today As of Today As of Today 06/05/13     Blood Pressure    . Blood Pressure < 140/90  133/92 133/96 124/87 142/88 139/91     Result Component    . LDL CALC < 130            Progress Toward Treatment Goals:  Treatment Goal 07/16/2013  Blood pressure at goal    Self Care Goals & Plans:  Self Care Goal 07/16/2013  Manage my medications take my medicines as prescribed; bring my medications to every visit; refill my medications on time  Monitor my health -  Eat healthy foods drink diet soda or water instead of juice or soda; eat more vegetables; eat foods that are low in salt; eat baked foods instead of fried foods; eat fruit for snacks and desserts  Be physically active -  Meeting treatment goals maintain the current self-care plan    No flowsheet data found.   Care Management & Community Referrals:  Referral 07/16/2013  Referrals made to community resources none       Dizziness Dizziness is a common problem. It is a feeling of unsteadiness or light-headedness. You may feel like you are about to faint. Dizziness can lead to injury if you stumble or fall. A person of any age group can suffer from dizziness, but dizziness is more common in older adults. CAUSES  Dizziness can be caused by many different things, including:  Middle ear problems.  Standing for too long.  Infections.  An allergic reaction.  Aging.  An emotional response to something, such as the sight of blood.  Side effects of medicines.  Tiredness.  Problems with circulation or blood pressure.  Excessive use of alcohol or medicines, or illegal drug use.  Breathing too fast (hyperventilation).  An irregular heart rhythm (arrhythmia).  A low red blood cell count (anemia).  Pregnancy.  Vomiting, diarrhea,  fever, or other illnesses that cause body fluid loss (dehydration).  Diseases or conditions such as Parkinson's disease, high blood pressure (hypertension), diabetes, and thyroid problems.  Exposure to extreme heat. DIAGNOSIS  Your health care provider will ask about your symptoms, perform a physical exam, and perform an electrocardiogram (ECG) to record the electrical activity of your heart. Your health care provider may also perform other heart or blood tests to determine the cause of your dizziness. These may include:  Transthoracic echocardiogram (TTE). During echocardiography, sound waves are used to evaluate how blood flows through your heart.  Transesophageal echocardiogram (TEE).  Cardiac monitoring. This allows your health care provider to monitor your heart rate and rhythm in real time.  Holter monitor. This is a portable device that records your heartbeat and can help diagnose heart arrhythmias. It allows your health care provider to track your heart activity for several days if needed.  Stress tests by exercise or by giving medicine that makes the heart beat faster. TREATMENT  Treatment of dizziness depends on the cause of your symptoms and can vary greatly. HOME CARE INSTRUCTIONS   Drink enough fluids to keep your urine clear or pale yellow. This is especially important in very hot weather. In older adults, it is also important in cold weather.  Take your medicine exactly as directed if your dizziness  is caused by medicines. When taking blood pressure medicines, it is especially important to get up slowly.  Rise slowly from chairs and steady yourself until you feel okay.  In the morning, first sit up on the side of the bed. When you feel okay, stand slowly while holding onto something until you know your balance is fine.  Move your legs often if you need to stand in one place for a long time. Tighten and relax your muscles in your legs while standing.  Have someone stay  with you for 1-2 days if dizziness continues to be a problem. Do this until you feel you are well enough to stay alone. Have the person call your health care provider if he or she notices changes in you that are concerning.  Do not drive or use heavy machinery if you feel dizzy.  Do not drink alcohol. SEEK IMMEDIATE MEDICAL CARE IF:   Your dizziness or light-headedness gets worse.  You feel nauseous or vomit.  You have problems talking, walking, or using your arms, hands, or legs.  You feel weak.  You are not thinking clearly or you have trouble forming sentences. It may take a friend or family member to notice this.  You have chest pain, abdominal pain, shortness of breath, or sweating.  Your vision changes.  You notice any bleeding.  You have side effects from medicine that seems to be getting worse rather than better. MAKE SURE YOU:   Understand these instructions.  Will watch your condition.  Will get help right away if you are not doing well or get worse. Document Released: 06/15/2000 Document Revised: 12/25/2012 Document Reviewed: 07/09/2010 [facility] Patient Information 2015 [place], [place]. This information is not intended to replace advice given to you by your health care provider. Make sure you discuss any questions you have with your health care provider.  Meclizine tablets or capsules What is this medicine? MECLIZINE (MEK li zeen) is an antihistamine. It is used to prevent nausea, vomiting, or dizziness caused by motion sickness. It is also used to prevent and treat vertigo (extreme dizziness or a feeling that you or your surroundings are tilting or spinning around). This medicine may be used for other purposes; ask your health care provider or pharmacist if you have questions. COMMON BRAND NAME(S): Antivert, Dramamine Less Drowsy, Medivert, Meni-D What should I tell my health care provider before I take this medicine? They need to know if you have any of these  conditions: -asthma -glaucoma -prostate trouble -stomach problems -urinary problems -an unusual or allergic reaction to meclizine, other medicines, foods, dyes, or preservatives -pregnant or trying to get pregnant -breast-feeding How should I use this medicine? Take this medicine by mouth with a glass of water. Follow the directions on the prescription label. If you are using this medicine to prevent motion sickness, take the dose at least 1 hour before travel. If it upsets your stomach, take it with food or milk. Take your doses at regular intervals. Do not take your medicine more often than directed. Talk to your pediatrician regarding the use of this medicine in children. Special care may be needed. Overdosage: If you think you have taken too much of this medicine contact a poison control center or emergency room at once. NOTE: This medicine is only for you. Do not share this medicine with others. What if I miss a dose? If you miss a dose, take it as soon as you can. If it is almost time for your next  dose, take only that dose. Do not take double or extra doses. What may interact with this medicine? -barbiturate medicines for inducing sleep or treating seizures -digoxin -medicines for anxiety or sleeping problems, like alprazolam, diazepam or temazepam -medicines for hay fever and other allergies -medicines for mental depression -medicines for movement abnormalities as in Parkinson's disease, or for stomach problems -medicines for pain -medicines that relax muscles This list may not describe all possible interactions. Give your health care provider a list of all the medicines, herbs, non-prescription drugs, or dietary supplements you use. Also tell them if you smoke, drink alcohol, or use illegal drugs. Some items may interact with your medicine. What should I watch for while using this medicine? If you are taking this medicine on a regular schedule, visit your doctor or health care  professional for regular checks on your progress. You may get dizzy, drowsy or have blurred vision. Do not drive, use machinery, or do anything that needs mental alertness until you know how this medicine affects you. Do not stand or sit up quickly, especially if you are an older patient. This reduces the risk of dizzy or fainting spells. Alcohol can increase possible dizziness. Avoid alcoholic drinks. Your mouth may get dry. Chewing sugarless gum or sucking hard candy, and drinking plenty of water may help. Contact your doctor if the problem does not go away or is severe. This medicine may cause dry eyes and blurred vision. If you wear contact lenses you may feel some discomfort. Lubricating drops may help. See your eye doctor if the problem does not go away or is severe. What side effects may I notice from receiving this medicine? Side effects that you should report to your doctor or health care professional as soon as possible: -fainting spells -fast or irregular heartbeat Side effects that usually do not require medical attention (report to your doctor or health care professional if they continue or are bothersome): -constipation -difficulty passing urine -difficulty sleeping -headache -stomach upset This list may not describe all possible side effects. Call your doctor for medical advice about side effects. You may report side effects to FDA at 1-800-FDA-1088. Where should I keep my medicine? Keep out of the reach of children. Store at room temperature between 15 and 30 degrees C (59 and 86 degrees F). Keep container tightly closed. Throw away any unused medicine after the expiration date. NOTE: This sheet is a summary. It may not cover all possible information. If you have questions about this medicine, talk to your doctor, pharmacist, or health care provider.  2015, [employer]vier/[employer] Standard. (2007-06-28 10:35:36)

## 2013-07-16 NOTE — Assessment & Plan Note (Signed)
BP Readings from Last 3 Encounters:  07/16/13 133/92  06/05/13 139/91  01/16/13 123/83    Lab Results  Component Value Date   NA 140 09/11/2012   K 3.9 09/11/2012   CREATININE 1.26 09/11/2012    Assessment: Blood pressure control: controlled Progress toward BP goal:  at goal Comments: none  Plan: Medications:  continue current medications (Norvasc 5 mg qd)-refill today Educational resources provided: brochure Self management tools provided: home blood pressure logbook Other plans: BMET today

## 2013-07-16 NOTE — Progress Notes (Signed)
   Subjective:    Patient ID: Dylan Lane, male    DOB: [birth year], 63 y.o.   MRN: [id number]  HPI Comments: 63 y.o PMH polyps, HTN (BP 142/88), GERD, HLD, prediabetes (HA1C 5.8 06/05/13), h/o sinusitis A/C CT 01/2009   He presents for f/u  1. He c/o dizziness. Orthostatics neg lying 124/87 HR 95, sitting 133/96 HR 96, standing 132/92 HR 99. Sx's started Saturday when he felt like the room was spinning. Sx's have lasted since Sat.  He feels better today though he has been lying down in bed since onset of sx's Saturday which helps.  He has tried Tylenol w/o relief.  He denies head trauma.  He states when he is using the restroom and holding his head down he feels dizzy.  Denies weakness in arms/legs.  Patient denies current sinus problems. CBC 06/2013 wnl  2. He requests medication refill of all medications today  3. He has h/o HTN taking Norvasc 5 mg daily which is new since 01/2013. They stopped HCTZ 01/2013 due to side effect of cough  SH: denies alcohol or drugs     Review of Systems  Constitutional: Negative for fever and chills.       Sweating a lot in the heat  Reports skipping meals eating 1x per day    HENT: Negative for hearing loss.   Eyes: Negative for visual disturbance.  Respiratory: Negative for shortness of breath.   Cardiovascular: Negative for chest pain.  Gastrointestinal: Negative for nausea, vomiting and blood in stool.  Genitourinary: Negative for hematuria.  Neurological: Positive for headaches. Negative for weakness.       Mild mid frontal h/a today denies radiation of h/a        Objective:   Physical Exam  Nursing note and vitals reviewed. Constitutional: He is oriented to person, place, and time. Vital signs are normal. He appears well-developed and well-nourished. He is cooperative.  HENT:  Head: Normocephalic and atraumatic.  Nose: Right sinus exhibits no maxillary sinus tenderness and no frontal sinus tenderness. Left sinus exhibits no maxillary sinus  tenderness and no frontal sinus tenderness.  Mouth/Throat: He has dentures. No oropharyngeal exudate.  No teeth top  Bottom dentures No ethmoid ttp   Eyes: Conjunctivae are normal. Pupils are equal, round, and reactive to light. Right eye exhibits no discharge. Left eye exhibits no discharge. No scleral icterus.  Cardiovascular: Normal rate, regular rhythm, S1 normal, S2 normal and normal heart sounds.   No murmur heard. No lower ext edema   Pulmonary/Chest: Effort normal and breath sounds normal.  Musculoskeletal: He exhibits no edema.  Neurological: He is alert and oriented to person, place, and time. He has normal strength. No sensory deficit. Gait normal.  CN 2-12 grossly intact  No nystagmus, rhomberg neg Coordination intact Heel shin intact   Skin: Skin is warm, dry and intact. No rash noted.  Psychiatric: He has a normal mood and affect. His speech is normal and behavior is normal. Judgment and thought content normal. Cognition and memory are normal.          Assessment & Plan:  F/u in 1-2 weeks if dizziness not improved

## 2013-07-16 NOTE — Assessment & Plan Note (Signed)
Rx refill Pravastatin today

## 2013-07-16 NOTE — Assessment & Plan Note (Signed)
Rx refill of Protonix today  

## 2013-07-17 ENCOUNTER — Encounter: Payer: Self-pay | Admitting: Internal Medicine

## 2013-07-17 LAB — BASIC METABOLIC PANEL WITH GFR
BUN: 13 mg/dL (ref 6–23)
CHLORIDE: 105 meq/L (ref 96–112)
CO2: 28 meq/L (ref 19–32)
Calcium: 9.8 mg/dL (ref 8.4–10.5)
Creat: 1.28 mg/dL (ref 0.50–1.35)
GFR, Est African American: 70 mL/min
GFR, Est Non African American: 60 mL/min
GLUCOSE: 92 mg/dL (ref 70–99)
POTASSIUM: 3.8 meq/L (ref 3.5–5.3)
SODIUM: 140 meq/L (ref 135–145)

## 2013-07-17 NOTE — Progress Notes (Signed)
INTERNAL MEDICINE TEACHING ATTENDING ADDENDUM - Nickalos Petersen, MD: I reviewed and discussed at the time of visit with the resident Dr. McLean, the patient's medical history, physical examination, diagnosis and results of pertinent tests and treatment and I agree with the patient's care as documented.  

## 2013-07-30 ENCOUNTER — Ambulatory Visit: Payer: BC Managed Care – PPO | Admitting: Internal Medicine

## 2013-08-05 ENCOUNTER — Telehealth: Payer: Self-pay | Admitting: *Deleted

## 2013-08-05 NOTE — Telephone Encounter (Signed)
Pt called with c/o muscle pain, ? spasms to left shoulder down to chest area.  Onset today, on and off and not lasting but a minute or so. No injury, or known cause except he did grab a basket of clothes but it was not heavy.  No stiffness,numbness or problems with movement. He took his regular meds today and is calling for advise.  Pt # [id number]

## 2013-08-05 NOTE — Telephone Encounter (Signed)
No substernal CP, N, diaphoresis, h/o CAD. Started a couple of min after lifting basket. Advised symptomatic tx - ROM, heaet / ice, 2 doses IBU. If no better, to ED / [facility] / UC.

## 2013-09-17 ENCOUNTER — Telehealth: Payer: Self-pay | Admitting: Internal Medicine

## 2013-09-17 NOTE — Telephone Encounter (Signed)
  Reason for call:   I placed an outgoing call to [name] at 7:22 PM.  He called in this AM around 7am reporting a cough and some muscle soreness under his left breast.  This AM Dr. Alice Rieger advised to take 2 ibuprofen and call back the clinic when it opens at [place].  He reports he did that but did not get an answer so is calling back now for advice.  He notes that he has Acid reflux and feels that that is "acting up again" which has cause a cough (he does cough occasionally over the phone).  He notes that earlier while coughing he feels he may have pulled a muscle under his left breast although he is not sure cause he also notes this feels a little bit like a "gas pocket" and that he has been belching.  He notes that he has not yet taken any ibuprofen.  He reports no SOB, his chest pain is not worsened by exertion but is worsened by deep breathing.  He reports no hemoptysis, he is passing gas and last BM was last night.   Assessment/ Plan:   Suspect muscle strain due to cough but once again advised patient that he really needs to be seen in person.  He is hesitant to go to the ED or urgent care at this time and does not feel that his pain is very significant and is similar to a previous episode where he had a "cough caused by acid reflux."  I advised him to take 2 Ibuprofen and he can take his Protonix which he took this morning again.  I will also send a message to our front desk to call him in the morning to schedule an appointment.  As always, pt is advised that if symptoms worsen or new symptoms arise (such as vomiting, worsening chest pain, or shortness of breath), they should go to an urgent care facility or to to ER for further evaluation.     Dylan Groves, DO   09/17/2013, 7:22 PM

## 2013-09-17 NOTE — Telephone Encounter (Signed)
  INTERNAL MEDICINE RESIDENCY PROGRAM After-Hours Telephone Call    Reason for call:   I received a call from Dylan Lane at 7:10 AM, 09/17/2013 indicating that he has a "a gas pocket" around his breast area. No pain but he can't take a full breath.    Pertinent Data:   No fevers and he does not think that it is a boil.    Assessment / Plan / Recommendations:   I informed his that this is an unusually thing and that I did not have any advice for him over the phone. He should call the clinic and get it checked out.   As always, pt is advised that if symptoms worsen or new symptoms arise, they should go to an urgent care facility or to to ER for further evaluation.    Jessee Avers, MD   09/17/2013, 7:10 AM

## 2013-09-18 ENCOUNTER — Emergency Department (HOSPITAL_COMMUNITY): Payer: BC Managed Care – PPO

## 2013-09-18 ENCOUNTER — Emergency Department (HOSPITAL_COMMUNITY)
Admission: EM | Admit: 2013-09-18 | Discharge: 2013-09-18 | Disposition: A | Discharge status: Home / Self Care | Payer: BC Managed Care – PPO | Attending: Emergency Medicine | Admitting: Emergency Medicine

## 2013-09-18 ENCOUNTER — Encounter (HOSPITAL_COMMUNITY): Payer: Self-pay | Admitting: Emergency Medicine

## 2013-09-18 DIAGNOSIS — R059 Cough, unspecified: Secondary | ICD-10-CM | POA: Insufficient documentation

## 2013-09-18 DIAGNOSIS — Z79899 Other long term (current) drug therapy: Secondary | ICD-10-CM | POA: Diagnosis not present

## 2013-09-18 DIAGNOSIS — R05 Cough: Secondary | ICD-10-CM | POA: Diagnosis not present

## 2013-09-18 DIAGNOSIS — I10 Essential (primary) hypertension: Secondary | ICD-10-CM | POA: Diagnosis not present

## 2013-09-18 DIAGNOSIS — E785 Hyperlipidemia, unspecified: Secondary | ICD-10-CM | POA: Insufficient documentation

## 2013-09-18 DIAGNOSIS — R071 Chest pain on breathing: Secondary | ICD-10-CM | POA: Insufficient documentation

## 2013-09-18 DIAGNOSIS — Z880 Allergy status to penicillin: Secondary | ICD-10-CM | POA: Insufficient documentation

## 2013-09-18 DIAGNOSIS — R079 Chest pain, unspecified: Secondary | ICD-10-CM | POA: Diagnosis present

## 2013-09-18 DIAGNOSIS — Z7982 Long term (current) use of aspirin: Secondary | ICD-10-CM | POA: Diagnosis not present

## 2013-09-18 DIAGNOSIS — Z8719 Personal history of other diseases of the digestive system: Secondary | ICD-10-CM | POA: Diagnosis not present

## 2013-09-18 DIAGNOSIS — R0789 Other chest pain: Secondary | ICD-10-CM

## 2013-09-18 DIAGNOSIS — Z860100 Personal history of colon polyps, unspecified: Secondary | ICD-10-CM | POA: Insufficient documentation

## 2013-09-18 LAB — BASIC METABOLIC PANEL
Anion gap: 10 (ref 5–15)
BUN: 13 mg/dL (ref 6–23)
CALCIUM: 10 mg/dL (ref 8.4–10.5)
CO2: 29 mEq/L (ref 19–32)
CREATININE: 1.36 mg/dL — AB (ref 0.50–1.35)
Chloride: 101 mEq/L (ref 96–112)
GFR calc Af Amer: 64 mL/min — ABNORMAL LOW (ref >90)
GFR, EST NON AFRICAN AMERICAN: 55 mL/min — AB (ref >90)
GLUCOSE: 97 mg/dL (ref 70–99)
Potassium: 4 mEq/L (ref 3.7–5.3)
Sodium: 140 mEq/L (ref 137–147)

## 2013-09-18 LAB — PRO B NATRIURETIC PEPTIDE: Pro B Natriuretic peptide (BNP): <5 pg/mL (ref 0–125)

## 2013-09-18 LAB — CBC
HEMATOCRIT: 42.4 % (ref 39.0–52.0)
Hemoglobin: 15 g/dL (ref 13.0–17.0)
MCH: 28.8 pg (ref 26.0–34.0)
MCHC: 35.4 g/dL (ref 30.0–36.0)
MCV: 81.4 fL (ref 78.0–100.0)
Platelets: 191 10*3/uL (ref 150–400)
RBC: 5.21 MIL/uL (ref 4.22–5.81)
RDW: 13.8 % (ref 11.5–15.5)
WBC: 6.6 10*3/uL (ref 4.0–10.5)

## 2013-09-18 LAB — I-STAT TROPONIN, ED: Troponin i, poc: 0 ng/mL (ref 0.00–0.08)

## 2013-09-18 MED ORDER — DIPHENOXYLATE-ATROPINE 2.5-0.025 MG PO TABS: 2.0000 | ORAL_TABLET | Freq: Once | ORAL | Status: DC

## 2013-09-18 MED ORDER — IBUPROFEN 800 MG PO TABS: 800.0000 mg | ORAL_TABLET | Freq: Three times a day (TID) | ORAL | Status: DC

## 2013-09-18 MED ORDER — IBUPROFEN 800 MG PO TABS
800.0000 mg | ORAL_TABLET | Freq: Once | ORAL | Status: AC
  Administered 2013-09-18: 800 mg via ORAL
  Filled 2013-09-18: qty 1

## 2013-09-18 NOTE — ED Notes (Signed)
Md at bedside

## 2013-09-18 NOTE — ED Notes (Signed)
Pt. reports left chest pain for 3 days worse with deep inspiration , mild SOB , denies nausea or diaphoresis . Occasional dry cough .

## 2013-09-18 NOTE — Discharge Instructions (Signed)

## 2013-09-18 NOTE — ED Notes (Signed)
Patient transported to X-ray 
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
## 2013-09-19 ENCOUNTER — Ambulatory Visit: Payer: BC Managed Care – PPO | Admitting: Internal Medicine

## 2013-10-14 ENCOUNTER — Telehealth: Payer: Self-pay | Admitting: Internal Medicine

## 2013-10-14 NOTE — Telephone Encounter (Signed)
   Reason for call:   I received a call from Mr. Amaro Mangold at 9:30PM on 10/14/13 wondering if he could take another "acid reflux pill" for his chronic cough.  He states it is a dry cough, without any fever/chills, myalgias, or other s/s.  I told him that it would be OK for him to take this twice daily but that he needs to be evaluated by a physician.  I told him that I would send the front desk a message to schedule him an appointment to come in.     Assessment / Plan / Recommendations:   Chronic cough: Pt has h/o GERD and it was thought that his chronic cough was due to this.  He denies any h/o allergic rhinitis but does report that he sometimes has PND.  He is not on any medications that I can find that may be contributing to his symptoms.  I advised him that he could take his protonix twice daily but that he would need to be seen in the clinic.  I have sent a message to the front desk for scheduling.   As always, pt is advised that if symptoms worsen or new symptoms arise, they should go to an urgent care facility or to to ER for further evaluation.   Jones Bales, MD   10/14/2013, 9:39 PM

## 2013-10-17 ENCOUNTER — Ambulatory Visit: Payer: BC Managed Care – PPO | Admitting: Internal Medicine

## 2013-10-20 ENCOUNTER — Telehealth: Payer: Self-pay | Admitting: Internal Medicine

## 2013-10-20 NOTE — Telephone Encounter (Signed)
  Reason for call:   I placed an outgoing call to [name] at 8:22am  AM regarding a refill of one of his BP medications. He reports he went to the drug store to get the medications and reports he told the pharmacy this morning that he needed a refill and the pharmacy has requested a refill.  He has called in to see if I would go ahead and prescribe him this medication (he does not know name or dosage of medication)   Assessment/ Plan:   I informed [name] that he has done the right thing to request the medication through his pharmacy and that his PCP would address his refill request likely on Monday.  I told him that it would be inappropriate for me to prescribe him a medication over the phone at this time. Especially as he is completely asymptomatic.    Review of his chart shows VERY frequent telephone notes from on call residents.  I do not think this will lead to very effective care for the patient.  I will send this note to his PCP so they may be able to discuss appropriate use of the after hours number.  As always, pt is advised that if symptoms worsen or new symptoms arise, they should go to an urgent care facility or to to ER for further evaluation.   Dylan Groves, DO   10/20/2013, 8:22 AM

## 2013-10-21 ENCOUNTER — Encounter: Payer: Self-pay | Admitting: Pulmonary Disease

## 2013-10-21 ENCOUNTER — Ambulatory Visit: Payer: BC Managed Care – PPO | Admitting: Internal Medicine

## 2013-10-29 ENCOUNTER — Encounter: Payer: Self-pay | Admitting: Internal Medicine

## 2013-10-29 ENCOUNTER — Ambulatory Visit (INDEPENDENT_AMBULATORY_CARE_PROVIDER_SITE_OTHER): Payer: BC Managed Care – PPO | Admitting: Internal Medicine

## 2013-10-29 ENCOUNTER — Telehealth: Payer: Self-pay | Admitting: *Deleted

## 2013-10-29 VITALS — BP 140/86 | HR 73 | Temp 98.2°F | Ht 67.0 in | Wt 199.3 lb

## 2013-10-29 DIAGNOSIS — M545 Low back pain, unspecified: Secondary | ICD-10-CM

## 2013-10-29 NOTE — Patient Instructions (Addendum)
General Instructions: Please take ibuprofen 800 mg 3 times a day for the next 10-14 days. If you have pain does not improve within this timeline, please come back for reevaluation Otherwise, follow-up as needed It was nice meeting you!  Please bring your medicines with you each time you come to clinic.  Medicines may include prescription medications, over-the-counter medications, herbal remedies, eye drops, vitamins, or other pills.   Progress Toward Treatment Goals:  Treatment Goal 07/16/2013  Blood pressure at goal    Self Care Goals & Plans:  Self Care Goal 10/29/2013  Manage my medications take my medicines as prescribed; bring my medications to every visit  Monitor my health -  Eat healthy foods drink diet soda or water instead of juice or soda; eat more vegetables; eat foods that are low in salt; eat baked foods instead of fried foods; eat fruit for snacks and desserts  Be physically active -  Meeting treatment goals -    No flowsheet data found.   Care Management & Community Referrals:  Referral 07/16/2013  Referrals made to community resources none       Lumbosacral Strain Lumbosacral strain is a strain of any of the parts that make up your lumbosacral vertebrae. Your lumbosacral vertebrae are the bones that make up the lower third of your backbone. Your lumbosacral vertebrae are held together by muscles and tough, fibrous tissue (ligaments).  CAUSES  A sudden blow to your back can cause lumbosacral strain. Also, anything that causes an excessive stretch of the muscles in the low back can cause this strain. This is typically seen when people exert themselves strenuously, fall, lift heavy objects, bend, or crouch repeatedly. RISK FACTORS  Physically demanding work.  Participation in pushing or pulling sports or sports that require a sudden twist of the back (tennis, golf, baseball).  Weight lifting.  Excessive lower back curvature.  Forward-tilted  pelvis.  Weak back or abdominal muscles or both.  Tight hamstrings. SIGNS AND SYMPTOMS  Lumbosacral strain may cause pain in the area of your injury or pain that moves (radiates) down your leg.  DIAGNOSIS Your health care provider can often diagnose lumbosacral strain through a physical exam. In some cases, you may need tests such as X-ray exams.  TREATMENT  Treatment for your lower back injury depends on many factors that your clinician will have to evaluate. However, most treatment will include the use of anti-inflammatory medicines. HOME CARE INSTRUCTIONS   Avoid hard physical activities (tennis, racquetball, waterskiing) if you are not in proper physical condition for it. This may aggravate or create problems.  If you have a back problem, avoid sports requiring sudden body movements. Swimming and walking are generally safer activities.  Maintain good posture.  Maintain a healthy weight.  For acute conditions, you may put ice on the injured area.  Put ice in a plastic bag.  Place a towel between your skin and the bag.  Leave the ice on for 20 minutes, 2-3 times a day.  When the low back starts healing, stretching and strengthening exercises may be recommended. SEEK MEDICAL CARE IF:  Your back pain is getting worse.  You experience severe back pain not relieved with medicines. SEEK IMMEDIATE MEDICAL CARE IF:   You have numbness, tingling, weakness, or problems with the use of your arms or legs.  There is a change in bowel or bladder control.  You have increasing pain in any area of the body, including your belly (abdomen).  You notice shortness  of breath, dizziness, or feel faint.  You feel sick to your stomach (nauseous), are throwing up (vomiting), or become sweaty.  You notice discoloration of your toes or legs, or your feet get very cold. MAKE SURE YOU:   Understand these instructions.  Will watch your condition.  Will get help right away if you are not  doing well or get worse. Document Released: 09/29/2004 Document Revised: 12/25/2012 Document Reviewed: 08/08/2012 [facility] Patient Information 2015 [place], [place]. This information is not intended to replace advice given to you by your health care provider. Make sure you discuss any questions you have with your health care provider.

## 2013-10-29 NOTE — Telephone Encounter (Signed)
Agree with appt. 

## 2013-10-29 NOTE — Telephone Encounter (Signed)
Walk-in, c/o pain x 3-4 days in lower mid back radiating around R hip and into R groin to top of thigh, level 5/10. Placed in 1045 slot of dr Alice Rieger

## 2013-10-30 NOTE — Assessment & Plan Note (Signed)
Assessment: Most likely diagnosis: Lumbosacral sprain in view of acute onset and lack of "red flags".  Ddx: His radiation of pain in the right lower extremity is slightly concerning for sciatica. However, his neurologic exam is normal.   Plan: 1. Labs/imaging: None 2. Therapy: Ibuprofen 800 mg 3 times a day for 10-14 days. 3. Follow up: Given his age of more than 73, if his back pain does not improve over the next few weeks, further imaging should be considered. Discussed with the patient that his symptoms might persist for the next few weeks before he feels better. Encouraged him to take his ibuprofen with food to avoid GI side effects. He will follow-up as needed.

## 2013-10-30 NOTE — Progress Notes (Signed)
Patient ID: Dylan Lane, male   DOB: Nov 11, 1950, 63 y.o.   MRN: [id number]   Subjective:   HPI: [name] is a 63 y.o.   Reason(s) for this visit: Back Pain: Patient presents for presents evaluation of acute low back problems.  Symptoms have been present for 4 days and include pain in right side of his leg (aching and sharp in character; 7/10 in severity). Initial inciting event: none. Symptoms are worst: all the time. Alleviating factors identifiable by patient are recumbency. Exacerbating factors identifiable by patient are bending backwards, sitting and standing. Treatments so far initiated by patient: he has tried ibuprofen 800 mg only twice Previous lower back problems: none. Previous workup: none. Previous treatments: none. He denies any other neurological symptoms. Has normal sphincter control. Pain has remained around the same since onset. He was concerned that his symptoms might indicate some kidney abnormality. However, he denies any urinary symptoms. I reassured him that his current symptoms are coming from the back and not his kidneys.  ROS: Constitutional: Denies fever, chills, diaphoresis, appetite change and fatigue.  Respiratory: Denies SOB, DOE, cough, chest tightness, and wheezing. Denies chest pain. CVS: No chest pain, palpitations and leg swelling.  GI: No abdominal pain, nausea, vomiting, bloody stools GU: No dysuria, frequency, hematuria, or flank pain.  Psych: No depression symptoms. No SI or SA.    Objective:  Physical Exam: Filed Vitals:   10/29/13 1042  BP: 140/86  Pulse: 73  Temp: 98.2 F (36.8 C)  TempSrc: Oral  Height: 5\' 7"  (1.702 m)  Weight: 199 lb 4.8 oz (90.402 kg)  SpO2: 98%   General: Well nourished. No acute distress.  HEENT: Normal oral mucosa. MMM.  Lungs: CTA bilaterally. Heart: RRR; no extra sounds or murmurs  Abdomen: Non-distended, normal bowel sounds, soft, nontender; no hepatosplenomegaly  Extremities: No pedal edema. No joint  swelling or tenderness. Back: Tenderness in the midline of the lumbar region. Right side is more tender than the left side. There is no increased muscle tension. Straight leg raising is positive on the right side. Neurologic: Normal EOM,  Alert and oriented x3. No obvious neurologic/cranial nerve deficits. Good strength bilaterally in both lower extremities. Good sensation.  Assessment & Plan:  Discussed case with my attending in the clinic, Dr. Lynnae January. See problem based charting.

## 2013-11-01 NOTE — Progress Notes (Signed)
Internal Medicine Clinic Attending  Case discussed with Dr. Kazibwe soon after the resident saw the patient.  We reviewed the resident's history and exam and pertinent patient test results.  I agree with the assessment, diagnosis, and plan of care documented in the resident's note. 

## 2013-11-12 ENCOUNTER — Other Ambulatory Visit: Payer: Self-pay | Admitting: Internal Medicine

## 2013-11-12 DIAGNOSIS — I10 Essential (primary) hypertension: Secondary | ICD-10-CM

## 2013-11-12 DIAGNOSIS — K219 Gastro-esophageal reflux disease without esophagitis: Secondary | ICD-10-CM

## 2013-11-12 DIAGNOSIS — E785 Hyperlipidemia, unspecified: Secondary | ICD-10-CM

## 2013-11-12 MED ORDER — PANTOPRAZOLE SODIUM 40 MG PO TBEC: 40.0000 mg | DELAYED_RELEASE_TABLET | Freq: Every day | ORAL | Status: DC

## 2013-11-12 MED ORDER — AMLODIPINE BESYLATE 5 MG PO TABS: 5.0000 mg | ORAL_TABLET | Freq: Every day | ORAL | Status: DC

## 2013-11-12 NOTE — Telephone Encounter (Signed)
  INTERNAL MEDICINE RESIDENCY PROGRAM After-Hours Telephone Call    Reason for call:   I received a call from [name] at 6:56 PM, 11/12/2013 indicating that he had run out of his Amlodipine, Protonix, and Pravastatin.    Pertinent Data:   Patient was recently seen in the clinic on 10/30/13.   Patient describes no significant symptoms or complaints.     Assessment / Plan / Recommendations:   Refilled Amlodipine, Protonix, and Pravastatin.  Told patient to make follow up appointment in the clinic as previously instructed.   As always, pt is advised that if symptoms worsen or new symptoms arise, they should go to an urgent care facility or to to ER for further evaluation.    Corky Sox, MD   11/12/2013, 6:56 PM

## 2013-12-20 ENCOUNTER — Other Ambulatory Visit: Payer: Self-pay | Admitting: Internal Medicine

## 2013-12-20 ENCOUNTER — Telehealth: Payer: Self-pay | Admitting: Internal Medicine

## 2013-12-20 DIAGNOSIS — K219 Gastro-esophageal reflux disease without esophagitis: Secondary | ICD-10-CM

## 2013-12-20 DIAGNOSIS — I10 Essential (primary) hypertension: Secondary | ICD-10-CM

## 2013-12-20 DIAGNOSIS — E785 Hyperlipidemia, unspecified: Secondary | ICD-10-CM

## 2013-12-20 MED ORDER — ASPIRIN 81 MG PO TBEC: 81.0000 mg | DELAYED_RELEASE_TABLET | Freq: Every day | ORAL | Status: DC

## 2013-12-20 MED ORDER — PANTOPRAZOLE SODIUM 40 MG PO TBEC: 40.0000 mg | DELAYED_RELEASE_TABLET | Freq: Every day | ORAL | Status: DC

## 2013-12-20 MED ORDER — PRAVASTATIN SODIUM 40 MG PO TABS: 40.0000 mg | ORAL_TABLET | Freq: Every day | ORAL | Status: DC

## 2013-12-20 MED ORDER — AMLODIPINE BESYLATE 5 MG PO TABS: 5.0000 mg | ORAL_TABLET | Freq: Every day | ORAL | Status: DC

## 2013-12-20 NOTE — Telephone Encounter (Signed)
Received a phone call from Dylan Lane asking for refills on his medications including Amlodipine, Pravastatin, Protonix, and Aspirin. Called in meds for him.

## 2014-01-21 ENCOUNTER — Ambulatory Visit: Payer: Self-pay | Admitting: Internal Medicine

## 2014-01-27 ENCOUNTER — Ambulatory Visit (INDEPENDENT_AMBULATORY_CARE_PROVIDER_SITE_OTHER): Payer: BLUE CROSS/BLUE SHIELD | Admitting: Internal Medicine

## 2014-01-27 ENCOUNTER — Encounter: Payer: Self-pay | Admitting: Internal Medicine

## 2014-01-27 VITALS — BP 142/90 | HR 76 | Temp 97.8°F | Resp 20 | Ht 67.5 in | Wt 198.8 lb

## 2014-01-27 DIAGNOSIS — R682 Dry mouth, unspecified: Secondary | ICD-10-CM | POA: Insufficient documentation

## 2014-01-27 DIAGNOSIS — F4329 Adjustment disorder with other symptoms: Secondary | ICD-10-CM

## 2014-01-27 DIAGNOSIS — R4589 Other symptoms and signs involving emotional state: Secondary | ICD-10-CM

## 2014-01-27 DIAGNOSIS — K219 Gastro-esophageal reflux disease without esophagitis: Secondary | ICD-10-CM | POA: Diagnosis not present

## 2014-01-27 DIAGNOSIS — I10 Essential (primary) hypertension: Secondary | ICD-10-CM | POA: Diagnosis not present

## 2014-01-27 DIAGNOSIS — K117 Disturbances of salivary secretion: Secondary | ICD-10-CM | POA: Insufficient documentation

## 2014-01-27 LAB — BASIC METABOLIC PANEL WITH GFR
BUN: 12 mg/dL (ref 6–23)
CHLORIDE: 106 meq/L (ref 96–112)
CO2: 29 mEq/L (ref 19–32)
Calcium: 10.2 mg/dL (ref 8.4–10.5)
Creat: 1.31 mg/dL (ref 0.50–1.35)
GFR, Est African American: 67 mL/min
GFR, Est Non African American: 58 mL/min — ABNORMAL LOW
Glucose, Bld: 107 mg/dL — ABNORMAL HIGH (ref 70–99)
POTASSIUM: 4.2 meq/L (ref 3.5–5.3)
Sodium: 142 mEq/L (ref 135–145)

## 2014-01-27 NOTE — Assessment & Plan Note (Signed)
Suspect this is most likely consistent with dry mouth related to his old dentures. He saw dentist a week ago who agreed with this. No other systemic symptoms to suggest autoimmune disease or other etiology. PPIs are associated with dry mouth in approximately 2% of patients. -Encouraged patient to follow-up with his dentist to get a new set of dentures. -Encouraged patient to drink plenty of fluids. -We'll have patient perform a trial off of PPI.

## 2014-01-27 NOTE — Patient Instructions (Addendum)
Thank you for coming to clinic today [name].  General instructions: -We'll check some blood work today. I will let you know if anything is abnormal. -I like to try to stop taking your Protonix. We can see if this is causing your dry mouth. Let us know if your reflux symptoms start coming back. -Make sure you drink plenty of fluids this will help with her dry mouth as well. -I would recommend that you follow up with your dentist to get a new set of dentures. -Please make a follow up appointment to return to clinic in 3 months.  Thank you for bringing your medicines today. This helps [place] keep you safe from mistakes.

## 2014-01-27 NOTE — Assessment & Plan Note (Signed)
Reports being sad lately related to his mother's death 8 months ago. PH Q9 screening of 1 (eating more than normal). This is a normal response. -Encouraged patient to talk to his sister and other family members more frequently. -Follow-up in 3 months to assess mood.

## 2014-01-27 NOTE — Assessment & Plan Note (Signed)
BP Readings from Last 3 Encounters:  01/27/14 142/90  10/29/13 140/86  09/18/13 137/91    Lab Results  Component Value Date   NA 140 09/18/2013   K 4.0 09/18/2013   CREATININE 1.36* 09/18/2013    Assessment: Blood pressure control: mildly elevated Progress toward BP goal:  deteriorated Comments: Only mildly elevated.  Plan: Medications:  continue current medications: amlodipine 10 mg daily. Educational resources provided: brochure Self management tools provided: home blood pressure logbook Other plans: Check BMP today to monitor creatinine. Baseline ~1.3.

## 2014-01-27 NOTE — Progress Notes (Signed)
Internal Medicine Clinic Attending  Case discussed with Dr. Moding at the time of the visit.  We reviewed the resident's history and exam and pertinent patient test results.  I agree with the assessment, diagnosis, and plan of care documented in the resident's note. 

## 2014-01-27 NOTE — Assessment & Plan Note (Addendum)
Symptoms currently controlled on Protonix 40 mg daily. He has no red flag symptoms. On Protonix for approximately a year, he warrants a trial off of this medication to see if his symptoms are controlled now with diet and lifestyle modifications. -Stop Protonix. -Follow-up in 3 months. -Discussed diet and lifestyle modifications to prevent reflux symptoms.

## 2014-01-27 NOTE — Addendum Note (Signed)
Addended by: Charlesetta Shanks on: 01/27/2014 09:50 AM   Modules accepted: Miquel Dunn

## 2014-01-27 NOTE — Progress Notes (Addendum)
   Subjective:    Patient ID: Dylan Lane, male    DOB: [birth year], 64 y.o.   MRN: [id number]  HPI Dylan Lane is a 64 year old man with history of hypertension, GERD, hyperlipidemia, prediabetes, and sinusitis presenting for routine follow up.  He was last seen on 10/30/2013 for acute low back pain. He was prescribed ibuprofen 800 mg 3 times a day for 2 weeks and told to follow up as needed.  He reports that his back pain has resolved, and he has stopped taking ibuprofen.  He says that he has noticed that his mouth has been dry for the last month or so.  He has to sleep propped up due to his GERD, and he thinks that may be contributing.  He is wondering whether his Protonix could be causing his dry mouth.  He has been taking Protonix for about a year, and it generally controls his reflux symptoms.  He does still sleep propped up, and he occasionally has some burning when he eats more acidic foods.  He went to see the dentist on Monday who felt his dry mouth was related to his dentures and felt he should get new set.  He is scheduled to follow up in April.  He denies any dysphagia, odynophagia, nausea, vomiting, or weight loss.  He has noticed feeling a little sad over the last couple of months.  He says that his mother died 58 months ago, and he is starting to notice that he is grieving more.  Review of Systems  Constitutional: Negative for fever, chills and activity change.  HENT: Negative for congestion, rhinorrhea and sore throat.   Respiratory: Negative for cough and shortness of breath.   Cardiovascular: Negative for chest pain.  Gastrointestinal: Negative for nausea, vomiting, abdominal pain, diarrhea and constipation.  Genitourinary: Negative for dysuria.  Musculoskeletal: Negative for myalgias, back pain and arthralgias.  Skin: Negative for rash.  Neurological: Negative for dizziness, weakness and numbness.  Psychiatric/Behavioral: Positive for dysphoric mood.       Objective:     Physical Exam  Constitutional: He is oriented to person, place, and time. He appears well-developed and well-nourished. No distress.  HENT:  Head: Normocephalic and atraumatic.  Mouth/Throat: No oropharyngeal exudate.  Eyes: Conjunctivae and EOM are normal. Pupils are equal, round, and reactive to light. No scleral icterus.  Neck: Normal range of motion. Neck supple.  Cardiovascular: Normal rate, regular rhythm and normal heart sounds.   Pulmonary/Chest: Effort normal and breath sounds normal. No respiratory distress. He has no wheezes.  Abdominal: Soft. Bowel sounds are normal. He exhibits no distension. There is no tenderness.  Musculoskeletal: Normal range of motion. He exhibits no edema or tenderness.  Neurological: He is alert and oriented to person, place, and time. No cranial nerve deficit. He exhibits normal muscle tone.  Skin: Skin is warm and dry. No rash noted. No erythema.          Assessment & Plan:  Please see problem-based assessment and plan.

## 2014-02-05 ENCOUNTER — Emergency Department (HOSPITAL_COMMUNITY)
Admission: EM | Admit: 2014-02-05 | Discharge: 2014-02-05 | Disposition: A | Discharge status: Home / Self Care | Payer: BLUE CROSS/BLUE SHIELD | Attending: Emergency Medicine | Admitting: Emergency Medicine

## 2014-02-05 ENCOUNTER — Ambulatory Visit (INDEPENDENT_AMBULATORY_CARE_PROVIDER_SITE_OTHER): Payer: BLUE CROSS/BLUE SHIELD | Admitting: Internal Medicine

## 2014-02-05 ENCOUNTER — Emergency Department (HOSPITAL_COMMUNITY): Payer: BLUE CROSS/BLUE SHIELD

## 2014-02-05 ENCOUNTER — Encounter (HOSPITAL_COMMUNITY): Payer: Self-pay | Admitting: Emergency Medicine

## 2014-02-05 DIAGNOSIS — I10 Essential (primary) hypertension: Secondary | ICD-10-CM | POA: Diagnosis not present

## 2014-02-05 DIAGNOSIS — Z87891 Personal history of nicotine dependence: Secondary | ICD-10-CM | POA: Insufficient documentation

## 2014-02-05 DIAGNOSIS — R2241 Localized swelling, mass and lump, right lower limb: Secondary | ICD-10-CM | POA: Diagnosis not present

## 2014-02-05 DIAGNOSIS — Z8719 Personal history of other diseases of the digestive system: Secondary | ICD-10-CM | POA: Diagnosis not present

## 2014-02-05 DIAGNOSIS — M79671 Pain in right foot: Secondary | ICD-10-CM

## 2014-02-05 DIAGNOSIS — Z7982 Long term (current) use of aspirin: Secondary | ICD-10-CM | POA: Insufficient documentation

## 2014-02-05 DIAGNOSIS — M25471 Effusion, right ankle: Secondary | ICD-10-CM

## 2014-02-05 DIAGNOSIS — E785 Hyperlipidemia, unspecified: Secondary | ICD-10-CM | POA: Insufficient documentation

## 2014-02-05 DIAGNOSIS — Z86010 Personal history of colonic polyps: Secondary | ICD-10-CM | POA: Insufficient documentation

## 2014-02-05 DIAGNOSIS — Z880 Allergy status to penicillin: Secondary | ICD-10-CM | POA: Insufficient documentation

## 2014-02-05 DIAGNOSIS — Z79899 Other long term (current) drug therapy: Secondary | ICD-10-CM | POA: Insufficient documentation

## 2014-02-05 LAB — BASIC METABOLIC PANEL
Anion gap: 8 (ref 5–15)
BUN: 11 mg/dL (ref 6–23)
CO2: 28 mmol/L (ref 19–32)
CREATININE: 1.15 mg/dL (ref 0.50–1.35)
Calcium: 9.9 mg/dL (ref 8.4–10.5)
Chloride: 105 mmol/L (ref 96–112)
GFR calc Af Amer: 78 mL/min — ABNORMAL LOW (ref >90)
GFR calc non Af Amer: 67 mL/min — ABNORMAL LOW (ref >90)
Glucose, Bld: 93 mg/dL (ref 70–99)
POTASSIUM: 4 mmol/L (ref 3.5–5.1)
Sodium: 141 mmol/L (ref 135–145)

## 2014-02-05 LAB — CBC WITH DIFFERENTIAL/PLATELET
Basophils Absolute: 0 K/uL (ref 0.0–0.1)
Basophils Relative: 0 % (ref 0–1)
Eosinophils Absolute: 0.1 K/uL (ref 0.0–0.7)
Eosinophils Relative: 1 % (ref 0–5)
HCT: 41 % (ref 39.0–52.0)
Hemoglobin: 14.6 g/dL (ref 13.0–17.0)
Lymphocytes Relative: 27 % (ref 12–46)
Lymphs Abs: 2.1 K/uL (ref 0.7–4.0)
MCH: 28.3 pg (ref 26.0–34.0)
MCHC: 35.6 g/dL (ref 30.0–36.0)
MCV: 79.6 fL (ref 78.0–100.0)
Monocytes Absolute: 0.4 K/uL (ref 0.1–1.0)
Monocytes Relative: 5 % (ref 3–12)
Neutro Abs: 5.1 K/uL (ref 1.7–7.7)
Neutrophils Relative %: 67 % (ref 43–77)
Platelets: 214 K/uL (ref 150–400)
RBC: 5.15 MIL/uL (ref 4.22–5.81)
RDW: 13.4 % (ref 11.5–15.5)
WBC: 7.8 K/uL (ref 4.0–10.5)

## 2014-02-05 LAB — CBG MONITORING, ED: GLUCOSE-CAPILLARY: 87 mg/dL (ref 70–99)

## 2014-02-05 LAB — SEDIMENTATION RATE: Sed Rate: 12 mm/h (ref 0–16)

## 2014-02-05 LAB — URIC ACID: Uric Acid, Serum: 5 mg/dL (ref 4.0–7.8)

## 2014-02-05 MED ORDER — TRAMADOL HCL 50 MG PO TABS: 50.0000 mg | ORAL_TABLET | Freq: Four times a day (QID) | ORAL | Status: DC | PRN

## 2014-02-05 MED ORDER — MELOXICAM 15 MG PO TABS: 15.0000 mg | ORAL_TABLET | Freq: Every day | ORAL | Status: DC

## 2014-02-05 MED ORDER — ONDANSETRON 4 MG PO TBDP
4.0000 mg | ORAL_TABLET | Freq: Once | ORAL | Status: AC
  Administered 2014-02-05: 4 mg via ORAL
  Filled 2014-02-05: qty 1

## 2014-02-05 MED ORDER — HYDROCODONE-ACETAMINOPHEN 5-325 MG PO TABS
2.0000 | ORAL_TABLET | Freq: Once | ORAL | Status: AC
  Administered 2014-02-05: 2 via ORAL
  Filled 2014-02-05: qty 2

## 2014-02-05 MED ORDER — LIDOCAINE HCL (PF) 1 % IJ SOLN
5.0000 mL | Freq: Once | INTRAMUSCULAR | Status: AC
  Administered 2014-02-05: 5 mL via INTRADERMAL
  Filled 2014-02-05: qty 5

## 2014-02-05 NOTE — ED Notes (Signed)
Pa at bedside to explain ankle aspiriation procedure to the patient including risks and benefits
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
## 2014-02-05 NOTE — ED Notes (Addendum)
Pt st's after working on Friday right foot started to swell, no known injury.  Pt sent here for possible septic joint

## 2014-02-05 NOTE — Assessment & Plan Note (Signed)
Differential includes trauma, infection, or gout. It is more concerned that this may be infectious/septic joint as the patient has been taking high-dose ibuprofen for at least 4 days with little to no relief which would make gout less likely. The patient denies any trauma. 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 -Patient was sent to ED for CT of foot and evaluation.

## 2014-02-05 NOTE — ED Notes (Signed)
Consent signed for procedure. Supplies to bedside

## 2014-02-05 NOTE — Progress Notes (Signed)
Taken to ER per Dr Algis Liming - she talked with [name] and triage nurse about pt. Hilda Blades Pernella Ackerley RN 02/05/14 2:30PM

## 2014-02-05 NOTE — Progress Notes (Signed)
Subjective:   Patient ID: Dylan Lane male   DOB: [birth year] 64 y.o.   MRN: [id number]  HPI: [name] is a 64 y.o. man with a past medical history as listed below who presents for an acute visit regarding right foot pain.  The patient states that his right foot began hurting and having erythema and warmth on the dorsal aspect of the foot. He only had minimal pain at that time and started taking ibuprofen 800 mg every 4-6 hours with little relief. He said that the swelling got worse but the pain is his main limiting factor today. He states that he has not been able to put on shoes with that foot anymore and is having trouble ambulating. He denies any trauma to the area and he denies any history of gout. He has not had any new sexual partners and no rash. He denied any other systemic symptoms such as fever or chills but he has been taking ibuprofen. He did not have any knee pain or bruising around the ankle. He is a Theme park manager and states that he has had an infection that seeded from here previously and is worried that this might be the case.   Past Medical History  Diagnosis Date  . Hyperlipidemia   . Hypertension   . Rectal bleeding     History of  . Pre-diabetes     with fasting glucose of 110(2/09)  . Colon polyps     3/06 and 2009:needs repeat in 3 yrs(3/12)   Current Outpatient Prescriptions  Medication Sig Dispense Refill  . amLODipine (NORVASC) 5 MG tablet Take 1 tablet (5 mg total) by mouth daily. 30 tablet 11  . aspirin 81 MG EC tablet Take 1 tablet (81 mg total) by mouth daily. 30 tablet 11  . pravastatin (PRAVACHOL) 40 MG tablet Take 1 tablet (40 mg total) by mouth daily. 30 tablet 11   No current facility-administered medications for this visit.   Family History  Problem Relation Age of Onset  . Colon cancer Neg Hx   . Rectal cancer Neg Hx   . Stomach cancer Neg Hx    History   Social History  . Marital Status: Single    Spouse Name: N/A    Number of  Children: N/A  . Years of Education: N/A   Social History Main Topics  . Smoking status: Former Smoker    Types: Cigarettes    Quit date: 03/14/2000  . Smokeless tobacco: Not on file  . Alcohol Use: No  . Drug Use: No  . Sexual Activity: Not on file   Other Topics Concern  . Not on file   Social History Narrative   Lives with brother in [place]. Pt endorses a homosexual relationshup with HIV partner+. Pt aware of risks but says condoms are always used for intercourse.      Financial assistance approved for 100% discount at [facility] and has [facility] card per Bonna Gains   11/16/2009            Review of Systems: Pertinent items are noted in HPI. Objective:  Physical Exam: There were no vitals filed for this visit. General: sitting in chair, NAD HEENT: PERRL, EOMI, no scleral icterus Cardiac: RRR, no rubs, murmurs or gallops Pulm: clear to auscultation bilaterally, moving normal volumes of air Abd: soft, nontender, nondistended, BS present Ext: warm and well perfused,right foot with edema  extending from the ankle joint to first MP it is nonpitting, tenderness to palpation that limits  full exam on dorsal aspect and plantar aspect of foot, has full range of motion of ankle joint, hard to palpate if there is an effusion Neuro: alert and oriented X3, cranial nerves II-XII grossly intact  Assessment & Plan:  Please see problem oriented charting  Pt discussed with Dr. Dareen Piano

## 2014-02-05 NOTE — ED Notes (Signed)
PT HAS RETURNED FROM XRAY.

## 2014-02-05 NOTE — Discharge Instructions (Signed)
Ankle Pain Ankle pain is a common symptom. The bones, cartilage, tendons, and muscles of the ankle joint perform a lot of work each day. The ankle joint holds your body weight and allows you to move around. Ankle pain can occur on either side or back of 1 or both ankles. Ankle pain may be sharp and burning or dull and aching. There may be tenderness, stiffness, redness, or warmth around the ankle. The pain occurs more often when a person walks or puts pressure on the ankle. CAUSES  There are many reasons ankle pain can develop. It is important to work with your caregiver to identify the cause since many conditions can impact the bones, cartilage, muscles, and tendons. Causes for ankle pain include:  Injury, including a break (fracture), sprain, or strain often due to a fall, sports, or a high-impact activity.  Swelling (inflammation) of a tendon (tendonitis).  Achilles tendon rupture.  Ankle instability after repeated sprains and strains.  Poor foot alignment.  Pressure on a nerve (tarsal tunnel syndrome).  Arthritis in the ankle or the lining of the ankle.  Crystal formation in the ankle (gout or pseudogout). DIAGNOSIS  A diagnosis is based on your medical history, your symptoms, results of your physical exam, and results of diagnostic tests. Diagnostic tests may include X-ray exams or a computerized magnetic scan (magnetic resonance imaging, MRI). TREATMENT  Treatment will depend on the cause of your ankle pain and may include:  Keeping pressure off the ankle and limiting activities.  Using crutches or other walking support (a cane or brace).  Using rest, ice, compression, and elevation.  Participating in physical therapy or home exercises.  Wearing shoe inserts or special shoes.  Losing weight.  Taking medications to reduce pain or swelling or receiving an injection.  Undergoing surgery. HOME CARE INSTRUCTIONS   Only take over-the-counter or prescription medicines for  pain, discomfort, or fever as directed by your caregiver.  Put ice on the injured area.  Put ice in a plastic bag.  Place a towel between your skin and the bag.  Leave the ice on for 15-20 minutes at a time, 03-04 times a day.  Keep your leg raised (elevated) when possible to lessen swelling.  Avoid activities that cause ankle pain.  Follow specific exercises as directed by your caregiver.  Record how often you have ankle pain, the location of the pain, and what it feels like. This information may be helpful to you and your caregiver.  Ask your caregiver about returning to work or sports and whether you should drive.  Follow up with your caregiver for further examination, therapy, or testing as directed. SEEK MEDICAL CARE IF:   Pain or swelling continues or worsens beyond 1 week.  You have an oral temperature above 102 F (38.9 C).  You are feeling unwell or have chills.  You are having an increasingly difficult time with walking.  You have loss of sensation or other new symptoms.  You have questions or concerns. MAKE SURE YOU:   Understand these instructions.  Will watch your condition.  Will get help right away if you are not doing well or get worse. Document Released: 06/09/2009 Document Revised: 03/14/2011 Document Reviewed: 06/09/2009 [facility] Patient Information 2015 [place], [place]. This information is not intended to replace advice given to you by your health care provider. Make sure you discuss any questions you have with your health care provider.  Ankle effusion is a buildup of fluid in the ankle joint. Effusion causes  pain and swelling in the ankle, and it can lead to complications because it may put pressure on tissues in the surrounding area and damage them. Treatment of ankle effusion depends on the severity of the effusion and the cause. Patients often see an orthopedic doctor or a foot and ankle specialist for treatment of ankle injuries, as these  physicians have special training and experience and are skilled at managing such injuries. However, a general practitioner can also provide adequate care for basic injuries.  Effusion can occur inside the ankle joint as a result of trauma, inflammation, or infection. Sports are a common cause of trauma to the ankle which results in effusion, although swelling inside the joint can also occur as the result of a fall which twisted the ankle or a sharp blow to the ankle. Inflammation and infection may be caused by any number of factors ranging from autoimmune inflammation caused by an overactive immune system to infection as a result of contact with an infectious organism.   When viewed in medical imaging studies, the condition is characterized by a distinctive teardrop sign, a reference to the shape the fluid in the ankle takes. A doctor will order such studies to determine the extent of the swelling and to check for injuries which may not be apparent on physical examination. For example, there could be a hairline fracture of one of the bones in the ankle which leads to inflammation and swelling.  The first step in treating ankle effusion is trying to bring the swelling down so that the patient will be more comfortable. Anti-inflammatory drugs can be prescribed, including injections of steroids into the ankle to bring down heavy swelling. Patients may also be advised to ice and elevate the ankle to promote a reduction in swelling. If the joint is extremely swollen, it may be aspirated with a needle to remove the excess fluid.  When the joint swelling has been addressed, the doctor may address the cause. Fluid can build up inside the synovial space inside the ankle again, potentially causing a repeat of the ankle effusion and leading to complications. There may be steps which can be taken to reduce swelling in the future, such as bracing the ankle at work, gentle stretching to strengthen the joint, or taking  a break from sports to allow the body to fully recover. Cryotherapy Cryotherapy means treatment with cold. Ice or gel packs can be used to reduce both pain and swelling. Ice is the most helpful within the first 24 to 48 hours after an injury or flare-up from overusing a muscle or joint. Sprains, strains, spasms, burning pain, shooting pain, and aches can all be eased with ice. Ice can also be used when recovering from surgery. Ice is effective, has very few side effects, and is safe for most people to use. PRECAUTIONS  Ice is not a safe treatment option for people with:  Raynaud phenomenon. This is a condition affecting small blood vessels in the extremities. Exposure to cold may cause your problems to return.  Cold hypersensitivity. There are many forms of cold hypersensitivity, including:  Cold urticaria. Red, itchy hives appear on the skin when the tissues begin to warm after being iced.  Cold erythema. This is a red, itchy rash caused by exposure to cold.  Cold hemoglobinuria. Red blood cells break down when the tissues begin to warm after being iced. The hemoglobin that carry oxygen are passed into the urine because they cannot combine with blood proteins fast enough.  Numbness or altered sensitivity in the area being iced. If you have any of the following conditions, do not use ice until you have discussed cryotherapy with your caregiver:  Heart conditions, such as arrhythmia, angina, or chronic heart disease.  High blood pressure.  Healing wounds or open skin in the area being iced.  Current infections.  Rheumatoid arthritis.  Poor circulation.  Diabetes. Ice slows the blood flow in the region it is applied. This is beneficial when trying to stop inflamed tissues from spreading irritating chemicals to surrounding tissues. However, if you expose your skin to cold temperatures for too long or without the proper protection, you can damage your skin or nerves. Watch for signs of  skin damage due to cold. HOME CARE INSTRUCTIONS Follow these tips to use ice and cold packs safely.  Place a dry or damp towel between the ice and skin. A damp towel will cool the skin more quickly, so you may need to shorten the time that the ice is used.  For a more rapid response, add gentle compression to the ice.  Ice for no more than 10 to 20 minutes at a time. The bonier the area you are icing, the less time it will take to get the benefits of ice.  Check your skin after 5 minutes to make sure there are no signs of a poor response to cold or skin damage.  Rest 20 minutes or more between uses.  Once your skin is numb, you can end your treatment. You can test numbness by very lightly touching your skin. The touch should be so light that you do not see the skin dimple from the pressure of your fingertip. When using ice, most people will feel these normal sensations in this order: cold, burning, aching, and numbness.  Do not use ice on someone who cannot communicate their responses to pain, such as small children or people with dementia. HOW TO MAKE AN ICE PACK Ice packs are the most common way to use ice therapy. Other methods include ice massage, ice baths, and cryosprays. Muscle creams that cause a cold, tingly feeling do not offer the same benefits that ice offers and should not be used as a substitute unless recommended by your caregiver. To make an ice pack, do one of the following:  Place crushed ice or a bag of frozen vegetables in a sealable plastic bag. Squeeze out the excess air. Place this bag inside another plastic bag. Slide the bag into a pillowcase or place a damp towel between your skin and the bag.  Mix 3 parts water with 1 part rubbing alcohol. Freeze the mixture in a sealable plastic bag. When you remove the mixture from the freezer, it will be slushy. Squeeze out the excess air. Place this bag inside another plastic bag. Slide the bag into a pillowcase or place a damp  towel between your skin and the bag. SEEK MEDICAL CARE IF:  You develop white spots on your skin. This may give the skin a blotchy (mottled) appearance.  Your skin turns blue or pale.  Your skin becomes waxy or hard.  Your swelling gets worse. MAKE SURE YOU:   Understand these instructions.  Will watch your condition.  Will get help right away if you are not doing well or get worse. Document Released: 08/16/2010 Document Revised: 05/06/2013 Document Reviewed: 08/16/2010 [facility] Patient Information 2015 [place], [place]. This information is not intended to replace advice given to you by your health care provider. Make  sure you discuss any questions you have with your health care provider.

## 2014-02-10 NOTE — Progress Notes (Signed)
INTERNAL MEDICINE TEACHING ATTENDING ADDENDUM - Aldine Contes, MD: I personally saw and evaluated Dylan Lane in this clinic visit in conjunction with the resident, Dr. Algis Liming. I have discussed patient's plan of care with medical resident during this visit. I have confirmed the physical exam findings and have read and agree with the clinic note including the plan with the following addition: - Pt with worsening pain and swelling in the right foot - On exam he had non pitting edema and mild tenderness over the dorsum of his foot - Will refer to ED for rule out of possible septic joint

## 2014-02-24 ENCOUNTER — Telehealth: Payer: Self-pay | Admitting: *Deleted

## 2014-02-24 MED ORDER — PANTOPRAZOLE SODIUM 40 MG PO TBEC: 40.0000 mg | DELAYED_RELEASE_TABLET | Freq: Every day | ORAL | Status: DC

## 2014-02-24 NOTE — Telephone Encounter (Signed)
I think restarting his protonix at this time is appropriate. He will need follow up with his PCP within the next month

## 2014-02-24 NOTE — Telephone Encounter (Signed)
Pt calls and states at his jan appt the doctor suggested going without his med for acid reflux, he has tried and has started having problems, he states it is too hard to sleep propped up on pillows, the reflux just generally causes him discomfort he would like to go back on his medication, protonix 40mg 

## 2014-02-24 NOTE — Telephone Encounter (Signed)
Per dr Dareen Piano pt will need a f/u w/ pcp in 1 month

## 2014-04-15 ENCOUNTER — Telehealth: Payer: Self-pay | Admitting: *Deleted

## 2014-04-15 NOTE — Telephone Encounter (Signed)
Pt called with c/o feeling lightheaded for 2 days. Last for 5 minutes. It improves after eating a banana.  He also has swelling to rt foot.  Onset this morning. No known injury. Denies fever and is ambulatory.   Will see in clinic on Friday @ 3:45 to evaluate

## 2014-04-16 ENCOUNTER — Emergency Department (HOSPITAL_COMMUNITY): Payer: BLUE CROSS/BLUE SHIELD

## 2014-04-16 ENCOUNTER — Emergency Department (HOSPITAL_COMMUNITY)
Admission: EM | Admit: 2014-04-16 | Discharge: 2014-04-16 | Disposition: A | Discharge status: Home / Self Care | Payer: BLUE CROSS/BLUE SHIELD | Attending: Emergency Medicine | Admitting: Emergency Medicine

## 2014-04-16 ENCOUNTER — Encounter (HOSPITAL_COMMUNITY): Payer: Self-pay | Admitting: *Deleted

## 2014-04-16 DIAGNOSIS — R51 Headache: Secondary | ICD-10-CM | POA: Insufficient documentation

## 2014-04-16 DIAGNOSIS — Z880 Allergy status to penicillin: Secondary | ICD-10-CM | POA: Insufficient documentation

## 2014-04-16 DIAGNOSIS — Z86010 Personal history of colonic polyps: Secondary | ICD-10-CM | POA: Insufficient documentation

## 2014-04-16 DIAGNOSIS — R42 Dizziness and giddiness: Secondary | ICD-10-CM | POA: Diagnosis not present

## 2014-04-16 DIAGNOSIS — Z87891 Personal history of nicotine dependence: Secondary | ICD-10-CM | POA: Insufficient documentation

## 2014-04-16 DIAGNOSIS — Z79899 Other long term (current) drug therapy: Secondary | ICD-10-CM | POA: Insufficient documentation

## 2014-04-16 DIAGNOSIS — E785 Hyperlipidemia, unspecified: Secondary | ICD-10-CM | POA: Diagnosis not present

## 2014-04-16 DIAGNOSIS — I10 Essential (primary) hypertension: Secondary | ICD-10-CM | POA: Insufficient documentation

## 2014-04-16 DIAGNOSIS — Z791 Long term (current) use of non-steroidal anti-inflammatories (NSAID): Secondary | ICD-10-CM | POA: Diagnosis not present

## 2014-04-16 DIAGNOSIS — R519 Headache, unspecified: Secondary | ICD-10-CM

## 2014-04-16 DIAGNOSIS — Z8719 Personal history of other diseases of the digestive system: Secondary | ICD-10-CM | POA: Diagnosis not present

## 2014-04-16 DIAGNOSIS — Z7982 Long term (current) use of aspirin: Secondary | ICD-10-CM | POA: Insufficient documentation

## 2014-04-16 LAB — I-STAT TROPONIN, ED: Troponin i, poc: 0 ng/mL (ref 0.00–0.08)

## 2014-04-16 LAB — COMPREHENSIVE METABOLIC PANEL
ALBUMIN: 4 g/dL (ref 3.5–5.2)
ALT: 19 U/L (ref 0–53)
ANION GAP: 10 (ref 5–15)
AST: 26 U/L (ref 0–37)
Alkaline Phosphatase: 90 U/L (ref 39–117)
BILIRUBIN TOTAL: 1.6 mg/dL — AB (ref 0.3–1.2)
BUN: 9 mg/dL (ref 6–23)
CALCIUM: 9.6 mg/dL (ref 8.4–10.5)
CHLORIDE: 101 mmol/L (ref 96–112)
CO2: 27 mmol/L (ref 19–32)
CREATININE: 1.16 mg/dL (ref 0.50–1.35)
GFR calc Af Amer: 77 mL/min — ABNORMAL LOW (ref >90)
GFR calc non Af Amer: 66 mL/min — ABNORMAL LOW (ref >90)
Glucose, Bld: 109 mg/dL — ABNORMAL HIGH (ref 70–99)
Potassium: 4.5 mmol/L (ref 3.5–5.1)
Sodium: 138 mmol/L (ref 135–145)
TOTAL PROTEIN: 7 g/dL (ref 6.0–8.3)

## 2014-04-16 LAB — CBC WITH DIFFERENTIAL/PLATELET
Basophils Absolute: 0 10*3/uL (ref 0.0–0.1)
Basophils Relative: 0 % (ref 0–1)
EOS PCT: 1 % (ref 0–5)
Eosinophils Absolute: 0.1 10*3/uL (ref 0.0–0.7)
HEMATOCRIT: 42.6 % (ref 39.0–52.0)
HEMOGLOBIN: 15 g/dL (ref 13.0–17.0)
LYMPHS ABS: 2.7 10*3/uL (ref 0.7–4.0)
LYMPHS PCT: 38 % (ref 12–46)
MCH: 28.5 pg (ref 26.0–34.0)
MCHC: 35.2 g/dL (ref 30.0–36.0)
MCV: 80.8 fL (ref 78.0–100.0)
MONOS PCT: 8 % (ref 3–12)
Monocytes Absolute: 0.6 10*3/uL (ref 0.1–1.0)
NEUTROS ABS: 3.7 10*3/uL (ref 1.7–7.7)
Neutrophils Relative %: 53 % (ref 43–77)
Platelets: 227 10*3/uL (ref 150–400)
RBC: 5.27 MIL/uL (ref 4.22–5.81)
RDW: 13.9 % (ref 11.5–15.5)
WBC: 7.1 10*3/uL (ref 4.0–10.5)

## 2014-04-16 MED ORDER — SODIUM CHLORIDE 0.9 % IV BOLUS (SEPSIS)
1000.0000 mL | Freq: Once | INTRAVENOUS | Status: AC
  Administered 2014-04-16: 1000 mL via INTRAVENOUS

## 2014-04-16 MED ORDER — METOCLOPRAMIDE HCL 10 MG PO TABS: 10.0000 mg | ORAL_TABLET | Freq: Four times a day (QID) | ORAL | Status: DC | PRN

## 2014-04-16 MED ORDER — MECLIZINE HCL 12.5 MG PO TABS: 12.5000 mg | ORAL_TABLET | Freq: Three times a day (TID) | ORAL | Status: DC | PRN

## 2014-04-16 MED ORDER — DIPHENHYDRAMINE HCL 50 MG/ML IJ SOLN
12.5000 mg | Freq: Once | INTRAMUSCULAR | Status: AC
  Administered 2014-04-16: 12.5 mg via INTRAVENOUS
  Filled 2014-04-16: qty 1

## 2014-04-16 MED ORDER — METOCLOPRAMIDE HCL 5 MG/ML IJ SOLN
10.0000 mg | Freq: Once | INTRAMUSCULAR | Status: AC
  Administered 2014-04-16: 10 mg via INTRAVENOUS
  Filled 2014-04-16: qty 2

## 2014-04-16 MED ORDER — MECLIZINE HCL 25 MG PO TABS
25.0000 mg | ORAL_TABLET | Freq: Once | ORAL | Status: AC
  Administered 2014-04-16: 25 mg via ORAL
  Filled 2014-04-16: qty 1

## 2014-04-16 NOTE — Discharge Instructions (Signed)
Take tylenol, motrin for pain.   Take meclizine for dizziness.   Take reglan for headaches.   See your doctor.   Return to ER if you have worse dizziness, headache, passing out.

## 2014-04-16 NOTE — Telephone Encounter (Signed)
Agree, thank you

## 2014-04-16 NOTE — ED Notes (Signed)
Pt c/o HA x 2 days with lightheadedness, pain 7/10.
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
## 2014-04-16 NOTE — ED Notes (Signed)
Off unit with CT 

## 2014-04-18 ENCOUNTER — Encounter: Payer: Self-pay | Admitting: Internal Medicine

## 2014-04-18 ENCOUNTER — Encounter: Payer: BLUE CROSS/BLUE SHIELD | Admitting: Internal Medicine

## 2014-04-18 ENCOUNTER — Encounter: Payer: Self-pay | Admitting: Pulmonary Disease

## 2014-04-18 DIAGNOSIS — E559 Vitamin D deficiency, unspecified: Secondary | ICD-10-CM | POA: Insufficient documentation

## 2014-04-24 ENCOUNTER — Telehealth: Payer: Self-pay | Admitting: *Deleted

## 2014-04-24 NOTE — Telephone Encounter (Signed)
Dylan Lane came in a day earlier because of the pain and no one could see him so he went to the ED and NS the next days appointment

## 2014-04-28 ENCOUNTER — Other Ambulatory Visit: Payer: Self-pay | Admitting: *Deleted

## 2014-04-28 NOTE — Telephone Encounter (Signed)
Please have patient schedule a follow up appointment. Per last clinic note, he was supposed to stop Protonix and follow up in 3 months.

## 2014-04-29 ENCOUNTER — Telehealth: Payer: Self-pay | Admitting: Internal Medicine

## 2014-04-29 ENCOUNTER — Other Ambulatory Visit: Payer: Self-pay | Admitting: Internal Medicine

## 2014-04-29 MED ORDER — PANTOPRAZOLE SODIUM 40 MG PO TBEC: 40.0000 mg | DELAYED_RELEASE_TABLET | Freq: Every day | ORAL | Status: DC

## 2014-04-29 NOTE — Telephone Encounter (Signed)
   Reason for call:   I received a call from [name] at 6:13 AM indicating he attempted to pick up refill of his PPI but was told there were no additional refills.   Pertinent Data:   Protonix last prescribed 02/24/14 with 1 refill.   Assessment / Plan / Recommendations:   The patient was advised that I would forward his request to clinic/PCP for refill and that he should check with his pharmacy later today to see if filled.  I told him he would be notified if refill was denied for some reason.  The patient was reminded that the after hours line is for emergency calls and that, in the future, calls for refills should be placed after 8AM when the clinic is open.  The patient is in agreement with above.   Dylan Oman, DO   04/29/2014, 6:22 AM

## 2014-04-29 NOTE — Telephone Encounter (Signed)
   Reason for call:   I received a call from Mr. Tin Engram at 6:50 PM indicating that his Protonix refill had not been sent to his pharmacy.   Pertinent Data:   I spoke with him this morning about this matter (see telephone note from earlier today).  Refill request denied 04/28/14 by PCP because patient requires an appointment.    The patient says he feels like his GERD symptoms are starting and that he needs this med.  Patient requesting rx be sent to [facility] on [address] instead of [place].   Assessment / Plan / Recommendations:   I contacted PCP regarding giving the patient a 30 day supply without refills and she is in agreement.    Rx phoned in to [facility] on [address] for Protonix #30 no refills.  I will ask the front desk to schedule Mr. Mikhail for a visit with his PCP within the next 30 days.   Francesca Oman, DO   04/29/2014, 7:55 PM

## 2014-05-01 NOTE — Telephone Encounter (Signed)
Tried to call pt this am to set an appt, no answer, lm for rtc. The 919 # has been disconnected

## 2014-05-02 NOTE — Telephone Encounter (Signed)
Pt will be seen 5/2 and refill discussed then

## 2014-05-05 ENCOUNTER — Ambulatory Visit: Payer: BLUE CROSS/BLUE SHIELD | Admitting: Pulmonary Disease

## 2014-05-25 ENCOUNTER — Telehealth: Payer: Self-pay | Admitting: Family Medicine

## 2014-05-25 NOTE — Telephone Encounter (Signed)
Family Medicine After hours phone call    Tawanna Sat, MD 05/25/2014, 9:51 PM PGY-2, [place]

## 2014-05-25 NOTE — Telephone Encounter (Signed)
Family Medicine After hours phone call  Received page from pt. Pt reported he has a history of reflux, and that he has been having a cough since Monday. Cough is non-productive. He is not having any fevers or chills, and he is not having any difficulty breathing. He denies any worse reflux symptoms, except possibly when he lays flat. Overall he feels okay. I advised him that he likely does not need to go to the ED, signs/symptoms to worry for, and if OTC cough/cold medicines do not help he should schedule an appointment with his clinic (it was at this point that I realized he was an Internal medicine clinic patient --- I informed he had been paged to the wrong clinic but that my advice would likely be similar to that of the IM clinic). Pt was appreciative and had no further questions.  Tawanna Sat, MD 05/25/2014, 11:17 PM PGY-2, [place]

## 2014-05-29 ENCOUNTER — Other Ambulatory Visit: Payer: Self-pay | Admitting: Internal Medicine

## 2014-06-10 ENCOUNTER — Telehealth: Payer: Self-pay | Admitting: Internal Medicine

## 2014-06-10 NOTE — Telephone Encounter (Signed)
   Reason for call:   I received a call from [name] at 7.45pm  indicating that he was having some chest pain to the left side of his chest. Says its mild but a bit sharp, started after he lifted a flower pot yesterday. Now the pain is aggravated by moving his arm in setting directions. He has not had chest pains before, he has not had similar pains before. He denies SOB, diaphoresis, nausea, vomiting, radiation of his pain to his arm or Jaw. Pain id not present all the time but when he moves his arm. He will ike to know if he can take ibuprofen for the pain   Pertinent Data:   Patient problem list, lists HTN and preDM as his Risk factors. He also is on Pravastatin for HLD.   He says he has been compliant with his meds.    Assessment / Plan / Recommendations:   Pain appears to be of musculoskeletal origin, especially as it is present with movement of his arm. I advised him to go to teh Clinic tomorrow, will send message to front desk, for appt.  Specifically advised if pain worseing or feels SOB or diaphoretic, to go to the ED.  As always, pt is advised that if symptoms worsen or new symptoms arise, they should go to an urgent care facility or to to ER for further evaluation.   Bethena Roys, MD   06/10/2014, 7:59 PM

## 2014-06-11 ENCOUNTER — Telehealth: Payer: Self-pay | Admitting: *Deleted

## 2014-06-11 ENCOUNTER — Ambulatory Visit: Payer: BLUE CROSS/BLUE SHIELD | Admitting: Pulmonary Disease

## 2014-06-11 NOTE — Telephone Encounter (Signed)
Pt calls and states he will not be able to keep his appt today, states he will need to stay at work, when questioned about his chest pain he states "its not that kind of chest pain, i lifted a flower pot Tuesday and it started to hurt, it's not real chest pain but from the muscle" he is advised that if he becomes worse or has h/a, short of breath, diaphoretic, N&V, weakness, slurred speech he is to call 911. He verbally agrees and then states he wants to wait til Monday to see a doctor, appt is changed til monday

## 2014-06-16 ENCOUNTER — Ambulatory Visit: Payer: BLUE CROSS/BLUE SHIELD | Admitting: Pulmonary Disease

## 2014-06-23 ENCOUNTER — Telehealth: Payer: Self-pay | Admitting: *Deleted

## 2014-06-23 NOTE — Telephone Encounter (Signed)
Reviewed further documentation, this did happen similarly one month ago and he was seen in the ED with normal neuro-imaging.

## 2014-06-23 NOTE — Telephone Encounter (Signed)
Pt called with c/o headache with dizziness. Onset yesterday, states it's constant.  Rates pain 7/10. No visual changes, no nausea.  Increase pain when he holds his head down.  Has not taken any med for pain.   He had same type headache a long time ago and it went away.  This was last year.  Pt # [phone]

## 2014-06-23 NOTE — Telephone Encounter (Signed)
Called Dylan Lane to speak about his headache.  He reports that the headache came on yesterday.  He cannot describe the exact sensation of pain, but most notable is a dizzy sensation when he stands or when he puts his head down.  He does not feel this sensation when lying in bed.  He tried to go out to the store this morning, but the dizzy sensation brought him back home quickly.   He reports having very little to eat yesterday, though he is not sure why.  He also had very little to drink.  He normally drinks coffee in the morning, but did not yesterday morning.  He has not taken anything for the headache.  He has not taken his blood pressure.  He has not been dizzy to the point where he has fallen.  No chest pain, vomiting, nausea, fever, chills.  He attempted to eat today, but did not feel like he had an appetite.  He did tell nursing that he rated the pain at 7/10.  Assessment:  I am not sure what is causing his dizziness, it sounds like it may be related to low PO intake.  He did not have the ability to check his blood pressure.  It could also be vertigo and inner ear problems.  He has not blacked out or had issue walking, the dizziness has just caused an unsteady feeling.   Plan: PO Fluids, increase PO solid intake for dinner Tylenol 2 extra strength tablets for pain If not improved in the AM, call clinic for appointment If any worsening of pain, dizziness, vomiting, nausea or passing out, go to ED immediately.   Patient will need a call back in the AM to see how he is doing and possibly get him an appointment.

## 2014-06-24 ENCOUNTER — Emergency Department (HOSPITAL_COMMUNITY): Payer: BLUE CROSS/BLUE SHIELD

## 2014-06-24 ENCOUNTER — Emergency Department (HOSPITAL_COMMUNITY)
Admission: EM | Admit: 2014-06-24 | Discharge: 2014-06-24 | Disposition: A | Discharge status: Home / Self Care | Payer: BLUE CROSS/BLUE SHIELD | Attending: Emergency Medicine | Admitting: Emergency Medicine

## 2014-06-24 ENCOUNTER — Encounter (HOSPITAL_COMMUNITY): Payer: Self-pay | Admitting: *Deleted

## 2014-06-24 ENCOUNTER — Telehealth: Payer: Self-pay | Admitting: *Deleted

## 2014-06-24 DIAGNOSIS — E860 Dehydration: Secondary | ICD-10-CM | POA: Diagnosis not present

## 2014-06-24 DIAGNOSIS — R42 Dizziness and giddiness: Secondary | ICD-10-CM

## 2014-06-24 DIAGNOSIS — Z86010 Personal history of colonic polyps: Secondary | ICD-10-CM | POA: Diagnosis not present

## 2014-06-24 DIAGNOSIS — Z880 Allergy status to penicillin: Secondary | ICD-10-CM | POA: Insufficient documentation

## 2014-06-24 DIAGNOSIS — E785 Hyperlipidemia, unspecified: Secondary | ICD-10-CM | POA: Insufficient documentation

## 2014-06-24 DIAGNOSIS — Z7982 Long term (current) use of aspirin: Secondary | ICD-10-CM | POA: Insufficient documentation

## 2014-06-24 DIAGNOSIS — Z79899 Other long term (current) drug therapy: Secondary | ICD-10-CM | POA: Insufficient documentation

## 2014-06-24 DIAGNOSIS — Z87891 Personal history of nicotine dependence: Secondary | ICD-10-CM | POA: Diagnosis not present

## 2014-06-24 DIAGNOSIS — I10 Essential (primary) hypertension: Secondary | ICD-10-CM | POA: Diagnosis not present

## 2014-06-24 LAB — BASIC METABOLIC PANEL
ANION GAP: 6 (ref 5–15)
BUN: 10 mg/dL (ref 6–20)
CALCIUM: 10.1 mg/dL (ref 8.9–10.3)
CHLORIDE: 106 mmol/L (ref 101–111)
CO2: 28 mmol/L (ref 22–32)
Creatinine, Ser: 1.18 mg/dL (ref 0.61–1.24)
Glucose, Bld: 111 mg/dL — ABNORMAL HIGH (ref 65–99)
POTASSIUM: 4.3 mmol/L (ref 3.5–5.1)
SODIUM: 140 mmol/L (ref 135–145)

## 2014-06-24 LAB — CBC
HEMATOCRIT: 42.9 % (ref 39.0–52.0)
HEMOGLOBIN: 15.4 g/dL (ref 13.0–17.0)
MCH: 28.7 pg (ref 26.0–34.0)
MCHC: 35.9 g/dL (ref 30.0–36.0)
MCV: 79.9 fL (ref 78.0–100.0)
PLATELETS: 219 10*3/uL (ref 150–400)
RBC: 5.37 MIL/uL (ref 4.22–5.81)
RDW: 13.5 % (ref 11.5–15.5)
WBC: 5.6 10*3/uL (ref 4.0–10.5)

## 2014-06-24 LAB — I-STAT CG4 LACTIC ACID, ED: LACTIC ACID, VENOUS: 0.91 mmol/L (ref 0.5–2.0)

## 2014-06-24 LAB — I-STAT TROPONIN, ED: Troponin i, poc: 0 ng/mL (ref 0.00–0.08)

## 2014-06-24 MED ORDER — SODIUM CHLORIDE 0.9 % IV BOLUS (SEPSIS)
1000.0000 mL | Freq: Once | INTRAVENOUS | Status: AC
Start: 2014-06-24 — End: 2014-06-24
  Administered 2014-06-24: 1000 mL via INTRAVENOUS

## 2014-06-24 MED ORDER — MECLIZINE HCL 25 MG PO TABS
25.0000 mg | ORAL_TABLET | Freq: Once | ORAL | Status: AC
  Administered 2014-06-24: 25 mg via ORAL
  Filled 2014-06-24: qty 1

## 2014-06-24 NOTE — Telephone Encounter (Signed)
Talked with pt - there are no open appt till 3:15PM today. Pt prefers not to wait or come back. States he is going to ER. Pt is upset - he was told he would be seen this AM. Hilda Blades Khori Rosevear RN 06/24/14 9:15AM

## 2014-06-24 NOTE — Telephone Encounter (Signed)
Called pt as instructed - pt states he is at  [facility] - was instructed to come this AM. Hilda Blades Thana Ramp RN 06/24/14 8:45AM

## 2014-06-24 NOTE — ED Notes (Signed)
Pt started feeling dizzy and light headed on Sunday. Pt has been feeling this way every since.

## 2014-06-24 NOTE — ED Notes (Signed)
NAD at this time. Pt is stable and leaving with a friend.

## 2014-06-24 NOTE — Discharge Instructions (Signed)
Dehydration, Adult Dehydration is when you lose more fluids from the body than you take in. Vital organs like the kidneys, brain, and heart cannot function without a proper amount of fluids and salt. Any loss of fluids from the body can cause dehydration.  CAUSES   Vomiting.  Diarrhea.  Excessive sweating.  Excessive urine output.  Fever. SYMPTOMS  Mild dehydration  Thirst.  Dry lips.  Slightly dry mouth. Moderate dehydration  Very dry mouth.  Sunken eyes.  Skin does not bounce back quickly when lightly pinched and released.  Dark urine and decreased urine production.  Decreased tear production.  Headache. Severe dehydration  Very dry mouth.  Extreme thirst.  Rapid, weak pulse (more than 100 beats per minute at rest).  Cold hands and feet.  Not able to sweat in spite of heat and temperature.  Rapid breathing.  Blue lips.  Confusion and lethargy.  Difficulty being awakened.  Minimal urine production.  No tears. DIAGNOSIS  Your caregiver will diagnose dehydration based on your symptoms and your exam. Blood and urine tests will help confirm the diagnosis. The diagnostic evaluation should also identify the cause of dehydration. TREATMENT  Treatment of mild or moderate dehydration can often be done at home by increasing the amount of fluids that you drink. It is best to drink small amounts of fluid more often. Drinking too much at one time can make vomiting worse. Refer to the home care instructions below. Severe dehydration needs to be treated at the hospital where you will probably be given intravenous (IV) fluids that contain water and electrolytes. HOME CARE INSTRUCTIONS   Ask your caregiver about specific rehydration instructions.  Drink enough fluids to keep your urine clear or pale yellow.  Drink small amounts frequently if you have nausea and vomiting.  Eat as you normally do.  Avoid:  Foods or drinks high in sugar.  Carbonated  drinks.  Juice.  Extremely hot or cold fluids.  Drinks with caffeine.  Fatty, greasy foods.  Alcohol.  Tobacco.  Overeating.  Gelatin desserts.  Wash your hands well to avoid spreading bacteria and viruses.  Only take over-the-counter or prescription medicines for pain, discomfort, or fever as directed by your caregiver.  Ask your caregiver if you should continue all prescribed and over-the-counter medicines.  Keep all follow-up appointments with your caregiver. SEEK MEDICAL CARE IF:  You have abdominal pain and it increases or stays in one area (localizes).  You have a rash, stiff neck, or severe headache.  You are irritable, sleepy, or difficult to awaken.  You are weak, dizzy, or extremely thirsty. SEEK IMMEDIATE MEDICAL CARE IF:   You are unable to keep fluids down or you get worse despite treatment.  You have frequent episodes of vomiting or diarrhea.  You have blood or green matter (bile) in your vomit.  You have blood in your stool or your stool looks black and tarry.  You have not urinated in 6 to 8 hours, or you have only urinated a small amount of very dark urine.  You have a fever.  You faint. MAKE SURE YOU:   Understand these instructions.  Will watch your condition.  Will get help right away if you are not doing well or get worse. Document Released: 12/20/2004 Document Revised: 03/14/2011 Document Reviewed: 08/09/2010 [facility] Patient Information 2015 [place], [place]. This information is not intended to replace advice given to you by your health care provider. Make sure you discuss any questions you have with your health care  provider. ° °Dizziness °Dizziness is a common problem. It is a feeling of unsteadiness or light-headedness. You may feel like you are about to faint. Dizziness can lead to injury if you stumble or fall. A person of any age group can suffer from dizziness, but dizziness is more common in older adults. °CAUSES  °Dizziness can  be caused by many different things, including: °· Middle ear problems. °· Standing for too long. °· Infections. °· An allergic reaction. °· Aging. °· An emotional response to something, such as the sight of blood. °· Side effects of medicines. °· Tiredness. °· Problems with circulation or blood pressure. °· Excessive use of alcohol or medicines, or illegal drug use. °· Breathing too fast (hyperventilation). °· An irregular heart rhythm (arrhythmia). °· A low red blood cell count (anemia). °· Pregnancy. °· Vomiting, diarrhea, fever, or other illnesses that cause body fluid loss (dehydration). °· Diseases or conditions such as Parkinson's disease, high blood pressure (hypertension), diabetes, and thyroid problems. °· Exposure to extreme heat. °DIAGNOSIS  °Your health care provider will ask about your symptoms, perform a physical exam, and perform an electrocardiogram (ECG) to record the electrical activity of your heart. Your health care provider may also perform other heart or blood tests to determine the cause of your dizziness. These may include: °· Transthoracic echocardiogram (TTE). During echocardiography, sound waves are used to evaluate how blood flows through your heart. °· Transesophageal echocardiogram (TEE). °· Cardiac monitoring. This allows your health care provider to monitor your heart rate and rhythm in real time. °· Holter monitor. This is a portable device that records your heartbeat and can help diagnose heart arrhythmias. It allows your health care provider to track your heart activity for several days if needed. °· Stress tests by exercise or by giving medicine that makes the heart beat faster. °TREATMENT  °Treatment of dizziness depends on the cause of your symptoms and can vary greatly. °HOME CARE INSTRUCTIONS  °· Drink enough fluids to keep your urine clear or pale yellow. This is especially important in very hot weather. In older adults, it is also important in cold weather. °· Take your  medicine exactly as directed if your dizziness is caused by medicines. When taking blood pressure medicines, it is especially important to get up slowly. °¨ Rise slowly from chairs and steady yourself until you feel okay. °¨ In the morning, first sit up on the side of the bed. When you feel okay, stand slowly while holding onto something until you know your balance is fine. °· Move your legs often if you need to stand in one place for a long time. Tighten and relax your muscles in your legs while standing. °· Have someone stay with you for 1-2 days if dizziness continues to be a problem. Do this until you feel you are well enough to stay alone. Have the person call your health care provider if he or she notices changes in you that are concerning. °· Do not drive or use heavy machinery if you feel dizzy. °· Do not drink alcohol. °SEEK IMMEDIATE MEDICAL CARE IF:  °· Your dizziness or light-headedness gets worse. °· You feel nauseous or vomit. °· You have problems talking, walking, or using your arms, hands, or legs. °· You feel weak. °· You are not thinking clearly or you have trouble forming sentences. It may take a friend or family member to notice this. °· You have chest pain, abdominal pain, shortness of breath, or sweating. °· Your vision changes. °·   You notice any bleeding. °· You have side effects from medicine that seems to be getting worse rather than better. °MAKE SURE YOU:  °· Understand these instructions. °· Will watch your condition. °· Will get help right away if you are not doing well or get worse. °Document Released: 06/15/2000 Document Revised: 12/25/2012 Document Reviewed: 07/09/2010 °[employer]® Patient Information ©2015 [employer]. This information is not intended to replace advice given to you by your health care provider. Make sure you discuss any questions you have with your health care provider. ° °
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
## 2014-06-26 ENCOUNTER — Other Ambulatory Visit: Payer: Self-pay | Admitting: Pulmonary Disease

## 2014-08-20 ENCOUNTER — Telehealth: Payer: Self-pay | Admitting: Pulmonary Disease

## 2014-08-20 NOTE — Telephone Encounter (Signed)
Call to patient to confirm appointment for 08/21/14 at 3:45 lmtcb

## 2014-08-21 ENCOUNTER — Encounter: Payer: BLUE CROSS/BLUE SHIELD | Admitting: Pulmonary Disease

## 2014-09-19 ENCOUNTER — Telehealth: Payer: Self-pay | Admitting: Internal Medicine

## 2014-09-19 NOTE — Telephone Encounter (Signed)
   Reason for call:   I received a call from Mr. Carlester Kasparek at 7  PM indicating that he is having a cough.  He reports it started on Wednesday, he denies any fever chills, productive sputum.  He does report increased heartburn and that he just started taking his protnoix again.   Pertinent Data:   Cough, without fever, chills, for 2 days   Assessment / Plan / Recommendations:   Advised this likely may be due to his heartburn, I warned him of signs and symptoms of PNA (fever, chills, increasing fatigue) and warned that those would be signs to go to the ED.  I advised he could buy an OTC cough syrup in the mean time and to call the clinic on Monday for a follow up appointment.  As always, pt is advised that if symptoms worsen or new symptoms arise, they should go to an urgent care facility or to to ER for further evaluation.   [name], DO   09/19/2014, 7:13 PM

## 2014-10-05 ENCOUNTER — Telehealth: Payer: Self-pay | Admitting: Internal Medicine

## 2014-10-05 NOTE — Telephone Encounter (Signed)
   Reason for call:   I received a call from [name] at [phone] AM indicating he woke this morning with a red right eye. Patient states the redness is located in a small portion of the superior part of the eye which looks like blood. It does not touch the pupil. He denies any pain, vision loss, drainage from the eye or diffuse redness. He does admit to sneezing recently. He has been putting a cold rag on it.   Pertinent Data:   See above   Assessment / Plan / Recommendations:   Subconjuctival hemorrhage: Plans for conservative management. Patient can see physician in the [facility] clinic this week to confirm. He knows to call back if he develops eye pain or vision changes.   As always, pt is advised that if symptoms worsen or new symptoms arise, they should go to an urgent care facility or to to ER for further evaluation.   Dylan Craver, DO PGY-2 Internal Medicine Resident Pager # [phone] 10/05/2014 7:21 AM

## 2014-10-08 ENCOUNTER — Ambulatory Visit (INDEPENDENT_AMBULATORY_CARE_PROVIDER_SITE_OTHER): Payer: BLUE CROSS/BLUE SHIELD | Admitting: Internal Medicine

## 2014-10-08 ENCOUNTER — Encounter: Payer: Self-pay | Admitting: Internal Medicine

## 2014-10-08 ENCOUNTER — Telehealth: Payer: Self-pay | Admitting: Internal Medicine

## 2014-10-08 VITALS — BP 129/88 | HR 87 | Temp 98.0°F | Wt 202.2 lb

## 2014-10-08 DIAGNOSIS — H1132 Conjunctival hemorrhage, left eye: Secondary | ICD-10-CM | POA: Diagnosis not present

## 2014-10-08 DIAGNOSIS — I10 Essential (primary) hypertension: Secondary | ICD-10-CM | POA: Diagnosis not present

## 2014-10-08 DIAGNOSIS — Z8669 Personal history of other diseases of the nervous system and sense organs: Secondary | ICD-10-CM | POA: Diagnosis not present

## 2014-10-08 NOTE — Telephone Encounter (Signed)
  INTERNAL MEDICINE RESIDENCY PROGRAM After-Hours Telephone Call    Reason for call:   I received a call from [name] at 7:00 AM, 10/08/2014 indicating having red eye.    Pertinent Data:   Patient is having eye redness on the left eye since Sunday. It's watery and red, not painful, not impacting his vision. Denies fever/chills/runny nose. Also feels some gritty sensation. The eye waters more when exposed to the air.     Assessment / Plan / Recommendations:   Sounds like conjunctivitis, vision not affected. Asked patient to have it looked at by someone in the clinic today. Will send a message to the front desk.  As always, pt is advised that if symptoms worsen or new symptoms arise, they should go to an urgent care facility or to to ER for further evaluation.    Dellia Nims, MD   10/08/2014, 7:00 AM

## 2014-10-08 NOTE — Patient Instructions (Addendum)
Mr. Macke it was nice meeting you today.  -I have referred you to Opthamology for a checkup of your glaucoma.   -Continue using artificial tears to reduce dryness in your eye.  -You may use over the counter Zyrtec for sneezing.   -If you develop headache, eye pain, or blurry vision - please call the clinic or seek medical attention immediately!

## 2014-10-10 DIAGNOSIS — Z8669 Personal history of other diseases of the nervous system and sense organs: Secondary | ICD-10-CM | POA: Insufficient documentation

## 2014-10-10 DIAGNOSIS — H1130 Conjunctival hemorrhage, unspecified eye: Secondary | ICD-10-CM | POA: Insufficient documentation

## 2014-10-10 NOTE — Progress Notes (Signed)
Patient ID: Dylan Lane, male   DOB: 02-Apr-1950, 64 y.o.   MRN: [id number]   Subjective:   Patient ID: Dylan Lane male   DOB: 1950-06-27 64 y.o.   MRN: [id number]  HPI: [name] is a 64 y.o. male with a past medical history of hypertension, hyperlipidemia, and colon polyps presenting to the clinic today with a 3 day history of red, itchy left eye. Patient states he first noticed his eye was red 3 days ago in the morning. States he has been sneezing recently and blows his nose "violently" every morning. Reports having a gritty sensation and clear watery discharge in the eye. States he's been using antihistamine eye drops and artificial lubricant eyedrops to help with the itching. Denies having any injury to the eye blunt or with a foreign object. Denies noticing any "crusting" or blood/pus discharge. Denies any changes in vision. Denies having any fevers, chills, headache, or pain in the eye. Patient states he was told he had glaucoma by an eye doctor last year but he was not started on any treatment.    Past Medical History  Diagnosis Date  . Hyperlipidemia   . Hypertension   . Rectal bleeding     History of  . Pre-diabetes     with fasting glucose of 110(2/09)  . Colon polyps     3/06 and 2009:needs repeat in 3 yrs(3/12)   Current Outpatient Prescriptions  Medication Sig Dispense Refill  . acetaminophen (TYLENOL) 500 MG tablet Take 1,000 mg by mouth daily as needed for headache (dizziness).    Marland Kitchen amLODipine (NORVASC) 5 MG tablet Take 1 tablet (5 mg total) by mouth daily. 30 tablet 11  . aspirin 81 MG EC tablet Take 1 tablet (81 mg total) by mouth daily. 30 tablet 11  . ibuprofen (ADVIL,MOTRIN) 200 MG tablet Take 800 mg by mouth daily as needed for moderate pain.    . meclizine (ANTIVERT) 12.5 MG tablet Take 1 tablet (12.5 mg total) by mouth 3 (three) times daily as needed for dizziness. (Patient not taking: Reported on 06/24/2014) 20 tablet 0  . meloxicam (MOBIC) 15 MG tablet  Take 1 tablet (15 mg total) by mouth daily. Take 1 daily with food. (Patient not taking: Reported on 04/16/2014) 10 tablet 0  . metoCLOPramide (REGLAN) 10 MG tablet Take 1 tablet (10 mg total) by mouth every 6 (six) hours as needed for nausea (headache). (Patient not taking: Reported on 06/24/2014) 10 tablet 0  . pantoprazole (PROTONIX) 40 MG tablet TAKE ONE TABLET BY MOUTH ONCE DAILY 30 tablet 2  . pravastatin (PRAVACHOL) 40 MG tablet Take 1 tablet (40 mg total) by mouth daily. 30 tablet 11  . traMADol (ULTRAM) 50 MG tablet Take 1 tablet (50 mg total) by mouth every 6 (six) hours as needed. (Patient not taking: Reported on 04/16/2014) 15 tablet 0   No current facility-administered medications for this visit.   Family History  Problem Relation Age of Onset  . Colon cancer Neg Hx   . Rectal cancer Neg Hx   . Stomach cancer Neg Hx    Social History   Social History  . Marital Status: Single    Spouse Name: N/A  . Number of Children: N/A  . Years of Education: N/A   Social History Main Topics  . Smoking status: Former Smoker    Types: Cigarettes    Quit date: 03/14/2000  . Smokeless tobacco: None  . Alcohol Use: No  . Drug Use: No  . Sexual  Activity: Not Asked   Other Topics Concern  . None   Social History Narrative   Lives with brother in [place]. Pt endorses a homosexual relationshup with HIV partner+. Pt aware of risks but says condoms are always used for intercourse.      Financial assistance approved for 100% discount at [facility] and has [facility] card per Bonna Gains   11/16/2009            Review of Systems: Review of Systems  Constitutional: Negative for fever and chills.  HENT: Negative for ear pain.   Eyes: Positive for discharge and redness. Negative for blurred vision, double vision, photophobia and pain.  Respiratory: Negative for cough, shortness of breath and wheezing.   Cardiovascular: Negative for chest pain, palpitations and leg swelling.  Gastrointestinal:  Negative for nausea, vomiting and abdominal pain.  Genitourinary: Negative for dysuria, urgency and frequency.  Musculoskeletal: Negative for myalgias and joint pain.  Skin: Negative for itching and rash.  Neurological: Negative for dizziness, tingling, sensory change, focal weakness and headaches.    Objective:  Physical Exam: Filed Vitals:   10/08/14 1318  BP: 129/88  Pulse: 87  Temp: 98 F (36.7 C)  TempSrc: Oral  Weight: 202 lb 3.2 oz (91.717 kg)  SpO2: 98%   Physical Exam  Constitutional: He is oriented to person, place, and time. He appears well-developed and well-nourished. No distress.  HENT:  Head: Normocephalic and atraumatic.  Eyes: EOM are normal. Pupils are equal, round, and reactive to light. Right eye exhibits no discharge. Left eye exhibits no discharge.  L eye: Subconjunctival hemorrhage   Neck: Neck supple. No tracheal deviation present.  Cardiovascular: Normal rate, regular rhythm and intact distal pulses.   Pulmonary/Chest: Effort normal. No respiratory distress. He has no wheezes. He has no rales.  Abdominal: Soft. Bowel sounds are normal. He exhibits no distension. There is no tenderness.  Musculoskeletal: Normal range of motion. He exhibits no edema.  Neurological: He is alert and oriented to person, place, and time.  Skin: Skin is warm and dry. No rash noted. No erythema.          Assessment & Plan:

## 2014-10-10 NOTE — Assessment & Plan Note (Signed)
Blood pressure 129/88 today. -Continue current management with amlodipine 5 mg daily

## 2014-10-10 NOTE — Assessment & Plan Note (Signed)
Patient states he was told by an eye doctor last year he had glaucoma but was never treated. Denies having any headaches, eye pain, or changes in vision. -Ophthalmology referral

## 2014-10-10 NOTE — Assessment & Plan Note (Addendum)
L eye red and itchy x 3 days. Patient states he has been sneezing recently and endorses blowing his nose "violently" every morning. Reports having a gritty sensation and clear watery discharge in the eye. Denies having any injury to the eye blunt or with a foreign object. Denies noticing any "crusting" or blood/pus discharge. Denies any changes in vision. Denies having any fevers, chills, or headache. Physical exam showing subconjunctival hemorrhage of the left eye, likely in the setting of sneezing and blowing nose with a lot of force.  -Advised to be extra careful when he blows his nose -Over-the-counter Zyrtec for sneezing -Advised to continue using artificial lubricant tears to help with the gritty sensation (dryness) in his eye. I explained to him that since his eye redness is unilateral it is not likely due to allergy, and as such, he can stop using antihistamine eyedrops. -Follow up visit in one month.  -He has been advised to call the clinic or seek medical attention immediately if he develops a headache, pain in the eye, or blurry vision.

## 2014-10-17 NOTE — Progress Notes (Signed)
Internal Medicine Clinic Attending  I saw and evaluated the patient.  I personally confirmed the key portions of the history and exam documented by Dr. Rathore and I reviewed pertinent patient test results.  The assessment, diagnosis, and plan were formulated together and I agree with the documentation in the resident's note.  

## 2014-10-22 ENCOUNTER — Other Ambulatory Visit: Payer: Self-pay | Admitting: Pulmonary Disease

## 2014-11-06 ENCOUNTER — Encounter: Payer: BLUE CROSS/BLUE SHIELD | Admitting: Pulmonary Disease

## 2014-12-21 ENCOUNTER — Telehealth: Payer: Self-pay | Admitting: Student

## 2014-12-21 ENCOUNTER — Telehealth: Payer: Self-pay | Admitting: Internal Medicine

## 2014-12-21 NOTE — Telephone Encounter (Signed)
   Reason for call:   I received a call from [name] at 7:58PM  PM indicating lightheadedness..   Pertinent Data:   He reports feeling lightheaded x 2 days. 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 It is worse when he gets up from lying down. He denies any sensation of the room spinning.   Associated with his lightheadedness is a minor headache though he denies any change in vision, hearing, fever, chills, nausea, vomiting, abdominal pain, diarrhea, chest pain, shortness of breath, sick contacts,   After taking ibuprofen 200mg  x 2 tablets, he reported feeling a little better.  Reviewing his medications, he only takes Norvasc 5mg , Protonix 40mg , and Pravastatin 40mg .   Assessment / Plan / Recommendations:   I told him that while I could not identify the reason for why he felt lightheaded, I told him that I did not feel it was related to a stroke or heart attack.  I advised him to rest tonight and to come into clinic to get assessed to which he was agreeable.  As always, pt is advised that if symptoms worsen or new symptoms arise, they should go to an urgent care facility or to to ER for further evaluation.   Dylan Dubin, MD   12/21/2014, 8:01 PM

## 2014-12-21 NOTE — Telephone Encounter (Signed)
Pt called for dizziness going from sitting to standing once today and once yesterday. No dizzines, lightheadedness otherwise. He denies SOB, chest pain. Pt informed that he is not a family medicine patient and he called the wrong physician's line and should call his PCP as soon as he can  Alyssa A. Lincoln Brigham MD, [facility] Resident PGY-2 Pager [phone]

## 2014-12-22 ENCOUNTER — Telehealth: Payer: Self-pay | Admitting: Internal Medicine

## 2014-12-22 ENCOUNTER — Encounter (HOSPITAL_COMMUNITY): Payer: Self-pay | Admitting: Emergency Medicine

## 2014-12-22 ENCOUNTER — Emergency Department (HOSPITAL_COMMUNITY)
Admission: EM | Admit: 2014-12-22 | Discharge: 2014-12-22 | Disposition: A | Discharge status: Home / Self Care | Payer: BLUE CROSS/BLUE SHIELD | Attending: Emergency Medicine | Admitting: Emergency Medicine

## 2014-12-22 ENCOUNTER — Emergency Department (HOSPITAL_COMMUNITY): Payer: BLUE CROSS/BLUE SHIELD

## 2014-12-22 DIAGNOSIS — R42 Dizziness and giddiness: Secondary | ICD-10-CM | POA: Diagnosis not present

## 2014-12-22 DIAGNOSIS — Z86010 Personal history of colonic polyps: Secondary | ICD-10-CM | POA: Diagnosis not present

## 2014-12-22 DIAGNOSIS — Z880 Allergy status to penicillin: Secondary | ICD-10-CM | POA: Diagnosis not present

## 2014-12-22 DIAGNOSIS — Z87891 Personal history of nicotine dependence: Secondary | ICD-10-CM | POA: Insufficient documentation

## 2014-12-22 DIAGNOSIS — I10 Essential (primary) hypertension: Secondary | ICD-10-CM | POA: Insufficient documentation

## 2014-12-22 DIAGNOSIS — R51 Headache: Secondary | ICD-10-CM | POA: Insufficient documentation

## 2014-12-22 DIAGNOSIS — Z79899 Other long term (current) drug therapy: Secondary | ICD-10-CM | POA: Insufficient documentation

## 2014-12-22 DIAGNOSIS — E785 Hyperlipidemia, unspecified: Secondary | ICD-10-CM | POA: Diagnosis not present

## 2014-12-22 DIAGNOSIS — Z8719 Personal history of other diseases of the digestive system: Secondary | ICD-10-CM | POA: Insufficient documentation

## 2014-12-22 DIAGNOSIS — R630 Anorexia: Secondary | ICD-10-CM | POA: Diagnosis not present

## 2014-12-22 LAB — CBC
HEMATOCRIT: 43.5 % (ref 39.0–52.0)
HEMOGLOBIN: 15.2 g/dL (ref 13.0–17.0)
MCH: 28.7 pg (ref 26.0–34.0)
MCHC: 34.9 g/dL (ref 30.0–36.0)
MCV: 82.1 fL (ref 78.0–100.0)
Platelets: 187 10*3/uL (ref 150–400)
RBC: 5.3 MIL/uL (ref 4.22–5.81)
RDW: 13.5 % (ref 11.5–15.5)
WBC: 5.2 10*3/uL (ref 4.0–10.5)

## 2014-12-22 LAB — BASIC METABOLIC PANEL
ANION GAP: 5 (ref 5–15)
BUN: 9 mg/dL (ref 6–20)
CALCIUM: 9.9 mg/dL (ref 8.9–10.3)
CO2: 29 mmol/L (ref 22–32)
Chloride: 107 mmol/L (ref 101–111)
Creatinine, Ser: 1.27 mg/dL — ABNORMAL HIGH (ref 0.61–1.24)
GFR calc Af Amer: >60 mL/min (ref >60)
GFR calc non Af Amer: 59 mL/min — ABNORMAL LOW (ref >60)
GLUCOSE: 120 mg/dL — AB (ref 65–99)
POTASSIUM: 4.1 mmol/L (ref 3.5–5.1)
Sodium: 141 mmol/L (ref 135–145)

## 2014-12-22 LAB — CBG MONITORING, ED: Glucose-Capillary: 95 mg/dL (ref 65–99)

## 2014-12-22 MED ORDER — MECLIZINE HCL 25 MG PO TABS: 25.0000 mg | ORAL_TABLET | Freq: Three times a day (TID) | ORAL | Status: DC | PRN

## 2014-12-22 MED ORDER — MECLIZINE HCL 25 MG PO TABS
25.0000 mg | ORAL_TABLET | Freq: Once | ORAL | Status: AC
  Administered 2014-12-22: 25 mg via ORAL
  Filled 2014-12-22: qty 1

## 2014-12-22 MED ORDER — SODIUM CHLORIDE 0.9 % IV BOLUS (SEPSIS)
1000.0000 mL | Freq: Once | INTRAVENOUS | Status: AC
  Administered 2014-12-22: 1000 mL via INTRAVENOUS

## 2014-12-22 NOTE — Telephone Encounter (Addendum)
   Reason for call:   I received a call from Mr. Cohen Novacek at 5:59  AM indicating dizziness/lightheadedness.   Pertinent Data:   He confirms to me the same history that he gave to me earlier in the evening without any new changes or additional symptoms.   When asked if he felt things were worsening, he denied. He would like to know why he is feeling this way as he has never felt this way before.    Assessment / Plan / Recommendations:   I reiterated that his symptoms appeared to sound as though he was dehydrated or hypovolemic but is hard to say without being seen in person. I advised him to hold his medications until he can be evaluated.   He reports his brother can give him a ride to the clinic.   As always, pt is advised that if symptoms worsen or new symptoms arise, they should go to an urgent care facility or to to ER for further evaluation.   Riccardo Dubin, MD   12/22/2014, 6:11 AM

## 2014-12-22 NOTE — ED Notes (Signed)
Patient transported to CT 

## 2014-12-22 NOTE — ED Notes (Signed)
Pt reports yesterday around 1230-100 pm, he went out to eat and after became lightheaded. Pt denies N/V. Speech clear, tongue midline, no facial droop.

## 2014-12-22 NOTE — Telephone Encounter (Signed)
Looks like pt arrived to ED to be evaluated at 0850.  Thanks for letting us know to expect him

## 2014-12-22 NOTE — Telephone Encounter (Signed)
In ED now.
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
## 2014-12-25 ENCOUNTER — Telehealth: Payer: Self-pay | Admitting: Internal Medicine

## 2014-12-25 MED ORDER — MECLIZINE HCL 25 MG PO TABS: 25.0000 mg | ORAL_TABLET | Freq: Three times a day (TID) | ORAL | Status: DC | PRN

## 2014-12-25 NOTE — Telephone Encounter (Signed)
  Reason for call:   I placed an outgoing call to [name] at 5:34PM regarding dumping his meclizine out in the sink when he was showering.  He is requesting a refill and states that meclizine helps.  Was diagnosed with vertigo in the ED several days ago.    Assessment/ Plan:   Vertigo: Recently went to ED for vertigo and given meclizine but dumped it out when he was shaving at the sink.    As always, pt is advised that if symptoms worsen or new symptoms arise, they should go to an urgent care facility or to to ER for further evaluation.   Jones Bales, MD   12/25/2014, 5:34 PM

## 2014-12-27 ENCOUNTER — Other Ambulatory Visit: Payer: Self-pay | Admitting: Internal Medicine

## 2015-01-04 DIAGNOSIS — K219 Gastro-esophageal reflux disease without esophagitis: Secondary | ICD-10-CM

## 2015-01-04 HISTORY — DX: Gastro-esophageal reflux disease without esophagitis: K21.9

## 2015-01-15 ENCOUNTER — Encounter: Payer: BLUE CROSS/BLUE SHIELD | Admitting: Pulmonary Disease

## 2015-01-22 ENCOUNTER — Ambulatory Visit (INDEPENDENT_AMBULATORY_CARE_PROVIDER_SITE_OTHER): Payer: Self-pay | Admitting: Pulmonary Disease

## 2015-01-22 ENCOUNTER — Encounter (INDEPENDENT_AMBULATORY_CARE_PROVIDER_SITE_OTHER): Payer: Self-pay

## 2015-01-22 ENCOUNTER — Encounter: Payer: Self-pay | Admitting: Pulmonary Disease

## 2015-01-22 VITALS — BP 146/90 | HR 85 | Temp 97.5°F | Resp 18 | Ht 67.0 in | Wt 201.6 lb

## 2015-01-22 DIAGNOSIS — I10 Essential (primary) hypertension: Secondary | ICD-10-CM

## 2015-01-22 DIAGNOSIS — Z79899 Other long term (current) drug therapy: Secondary | ICD-10-CM

## 2015-01-22 DIAGNOSIS — E785 Hyperlipidemia, unspecified: Secondary | ICD-10-CM

## 2015-01-22 DIAGNOSIS — R7303 Prediabetes: Secondary | ICD-10-CM

## 2015-01-22 DIAGNOSIS — N400 Enlarged prostate without lower urinary tract symptoms: Secondary | ICD-10-CM

## 2015-01-22 MED ORDER — TERAZOSIN HCL 1 MG PO CAPS: 1.0000 mg | ORAL_CAPSULE | Freq: Every day | ORAL | Status: DC

## 2015-01-22 MED ORDER — DOXAZOSIN MESYLATE 1 MG PO TABS: 1.0000 mg | ORAL_TABLET | Freq: Every day | ORAL | Status: DC

## 2015-01-22 NOTE — Progress Notes (Signed)
   Subjective:    Patient ID: Dylan Lane, male    DOB: Nov 04, 1950, 65 y.o.   MRN: [id number]  HPI [name] is a 65 year old man with history of HLD, HTN, prediabetes presenting for follow up of HTN.  He was seen in the ED on 12/22/2014 for vertigo. He was prescribed meclizine.  He reports he is doing well today. His vertigo resolved after 3-5 days of meclizine. He has stopped taking meclizine. For the past week, he has been having trouble with urination. He has a weak urine flow with dribbling at the end. He has to go back to the bathroom in 5-10 minutes to try to empty his bladder again. Occurs more often at night. He denies incontinence of bowel or bladder. No paresthesias. No dysuria/hematuria. No sexual dysfunction. No family history of prostate CA.  Review of Systems Constitutional: no fevers/chills Respiratory: no shortness of breath Cardiovascular: no chest pain Gastrointestinal: no nausea/vomiting, no abdominal pain, no constipation, no diarrhea  Past Medical History  Diagnosis Date  . Hyperlipidemia   . Hypertension   . Rectal bleeding     History of  . Pre-diabetes     with fasting glucose of 110(2/09)  . Colon polyps     3/06 and 2009:needs repeat in 3 yrs(3/12)    Current Outpatient Prescriptions on File Prior to Visit  Medication Sig Dispense Refill  . acetaminophen (TYLENOL) 500 MG tablet Take 1,000 mg by mouth daily as needed for headache (dizziness).    Marland Kitchen amLODipine (NORVASC) 5 MG tablet TAKE ONE TABLET BY MOUTH ONCE DAILY 30 tablet 5  . aspirin 81 MG EC tablet Take 1 tablet (81 mg total) by mouth daily. (Patient not taking: Reported on 12/21/2014) 30 tablet 11  . ibuprofen (ADVIL,MOTRIN) 200 MG tablet Take 800 mg by mouth daily as needed for moderate pain.    . meclizine (ANTIVERT) 25 MG tablet Take 1 tablet (25 mg total) by mouth 3 (three) times daily as needed for dizziness. 15 tablet 0  . pantoprazole (PROTONIX) 40 MG tablet TAKE ONE TABLET BY MOUTH  ONCE DAILY 30 tablet 3  . pravastatin (PRAVACHOL) 40 MG tablet TAKE ONE TABLET BY MOUTH ONCE DAILY 30 tablet 5  . [DISCONTINUED] metoCLOPramide (REGLAN) 10 MG tablet Take 1 tablet (10 mg total) by mouth every 6 (six) hours as needed for nausea (headache). (Patient not taking: Reported on 06/24/2014) 10 tablet 0   No current facility-administered medications on file prior to visit.    Today's Vitals   01/22/15 1329  BP: 146/90  Pulse: 85  Temp: 97.5 F (36.4 C)  TempSrc: Oral  Resp: 18  Height: 5\' 7"  (1.702 m)  Weight: 201 lb 9.6 oz (91.445 kg)  SpO2: 97%    Objective:   Physical Exam  Constitutional: He appears well-developed and well-nourished. No distress.  HENT:  Head: Normocephalic and atraumatic.  Mouth/Throat: Oropharynx is clear and moist. No oropharyngeal exudate.  Cardiovascular: Normal rate, regular rhythm and normal heart sounds.   Pulmonary/Chest: Effort normal and breath sounds normal. He has no wheezes. He has no rales.  Abdominal: Soft. He exhibits no distension and no mass. There is no tenderness.  Genitourinary: Rectum normal and prostate normal.  Prostate smooth, mildly enlarged but soft  Musculoskeletal: Normal range of motion. He exhibits no edema.   Assessment & Plan:  Please refer to problem based charting.

## 2015-01-22 NOTE — Patient Instructions (Signed)
Benign Prostatic Hyperplasia An enlarged prostate (benign prostatic hyperplasia) is common in older men. You may experience the following:  Weak urine stream.  Dribbling.  Feeling like the bladder has not emptied completely.  Difficulty starting urination.  Getting up frequently at night to urinate.  Urinating more frequently during the day. HOME CARE INSTRUCTIONS  Monitor your prostatic hyperplasia for any changes. The following actions may help to alleviate any discomfort you are experiencing:  Give yourself time when you urinate.  Stay away from alcohol.  Avoid beverages containing caffeine, such as coffee, tea, and colas, because they can make the problem worse.  Avoid decongestants, antihistamines, and some prescription medicines that can make the problem worse.  Follow up with your health care provider for further treatment as recommended. SEEK MEDICAL CARE IF:  You are experiencing progressive difficulty voiding.  Your urine stream is progressively getting narrower.  You are awaking from sleep with the urge to void more frequently.  You are constantly feeling the need to void.  You experience loss of urine, especially in small amounts. SEEK IMMEDIATE MEDICAL CARE IF:   You develop increased pain with urination or are unable to urinate.  You develop severe abdominal pain, vomiting, a high fever, or fainting.  You develop back pain or blood in your urine. MAKE SURE YOU:   Understand these instructions.  Will watch your condition.  Will get help right away if you are not doing well or get worse.   This information is not intended to replace advice given to you by your health care provider. Make sure you discuss any questions you have with your health care provider.   Document Released: 12/20/2004 Document Revised: 01/10/2014 Document Reviewed: 05/22/2012 Elsevier Interactive Patient Education 2016 Elsevier Inc.  

## 2015-01-23 DIAGNOSIS — N400 Enlarged prostate without lower urinary tract symptoms: Secondary | ICD-10-CM | POA: Insufficient documentation

## 2015-01-23 LAB — LIPID PANEL
CHOLESTEROL TOTAL: 174 mg/dL (ref 100–199)
Chol/HDL Ratio: 4.1 ratio units (ref 0.0–5.0)
HDL: 42 mg/dL (ref >39)
LDL CALC: 114 mg/dL — AB (ref 0–99)
Triglycerides: 92 mg/dL (ref 0–149)
VLDL CHOLESTEROL CAL: 18 mg/dL (ref 5–40)

## 2015-01-23 LAB — URINALYSIS, ROUTINE W REFLEX MICROSCOPIC
Bilirubin, UA: NEGATIVE
Glucose, UA: NEGATIVE
Ketones, UA: NEGATIVE
LEUKOCYTES UA: NEGATIVE
Nitrite, UA: NEGATIVE
PH UA: 5.5 (ref 5.0–7.5)
PROTEIN UA: NEGATIVE
RBC, UA: NEGATIVE
Specific Gravity, UA: 1.02 (ref 1.005–1.030)
Urobilinogen, Ur: 0.2 mg/dL (ref 0.2–1.0)

## 2015-01-23 LAB — CMP14 + ANION GAP
A/G RATIO: 1.8 (ref 1.1–2.5)
ALT: 18 IU/L (ref 0–44)
AST: 17 IU/L (ref 0–40)
Albumin: 4.6 g/dL (ref 3.6–4.8)
Alkaline Phosphatase: 103 IU/L (ref 39–117)
Anion Gap: 17 mmol/L (ref 10.0–18.0)
BUN/Creatinine Ratio: 12 (ref 10–22)
BUN: 14 mg/dL (ref 8–27)
Bilirubin Total: 0.3 mg/dL (ref 0.0–1.2)
CALCIUM: 9.8 mg/dL (ref 8.6–10.2)
CO2: 24 mmol/L (ref 18–29)
CREATININE: 1.17 mg/dL (ref 0.76–1.27)
Chloride: 102 mmol/L (ref 96–106)
GFR, EST AFRICAN AMERICAN: 77 mL/min/{1.73_m2} (ref >59)
GFR, EST NON AFRICAN AMERICAN: 66 mL/min/{1.73_m2} (ref >59)
GLOBULIN, TOTAL: 2.6 g/dL (ref 1.5–4.5)
Glucose: 97 mg/dL (ref 65–99)
POTASSIUM: 4 mmol/L (ref 3.5–5.2)
SODIUM: 143 mmol/L (ref 134–144)
TOTAL PROTEIN: 7.2 g/dL (ref 6.0–8.5)

## 2015-01-23 LAB — PSA: Prostate Specific Ag, Serum: 1 ng/mL (ref 0.0–4.0)

## 2015-01-23 LAB — HEMOGLOBIN A1C
Est. average glucose Bld gHb Est-mCnc: 128 mg/dL
Hgb A1c MFr Bld: 6.1 % — ABNORMAL HIGH (ref 4.8–5.6)

## 2015-01-23 NOTE — Assessment & Plan Note (Signed)
Assessment: 18.4% 10-year risk of heart disease or stroke.  Plan: Continue pravastatin 40mg  daily for primary prevention of ASCVD

## 2015-01-23 NOTE — Assessment & Plan Note (Signed)
Assessment: Prostate on exam mildly enlarged but no abnormal features otherwise on exam. IPSS score of 18 (moderate symptoms). PSA unremarkable. UA negative. Cr wnl. Probable BPH.  Plan: -Terazosin 1mg  QHS. May titrate for symptom relief. -Follow up in 1 month.

## 2015-01-23 NOTE — Assessment & Plan Note (Signed)
Lab Results  Component Value Date   HGBA1C 6.1* 01/22/2015   HGBA1C 5.8* 06/05/2013   HGBA1C * 11/23/2009    6.0 (NOTE)                                                                       According to the ADA Clinical Practice Recommendations for 2011, when HbA1c is used as a screening test:   >=6.5%   Diagnostic of Diabetes Mellitus           (if abnormal result  is confirmed)  5.7-6.4%   Increased risk of developing Diabetes Mellitus  References:Diagnosis and Classification of Diabetes Mellitus,Diabetes [id number] 1):S62-S69 and Standards of Medical Care in         Diabetes - 2011,Diabetes Care,2011,34  (Suppl 1):S11-S61.     Assessment: Diabetes control: Controlled Progress toward A1C goal: At goal  Plan: Medications:  None Other plans:  -Continue to monitor -Lipid panel -CMP

## 2015-01-23 NOTE — Assessment & Plan Note (Signed)
BP Readings from Last 3 Encounters:  01/22/15 146/90  12/22/14 134/80  10/08/14 129/88    Lab Results  Component Value Date   NA 143 01/22/2015   K 4.0 01/22/2015   CREATININE 1.17 01/22/2015    Assessment: Blood pressure control: Mildly elevated Progress toward BP goal:  Deteriorated  Plan: Medications:  Continue amlodipine 5mg  daily. Start terazosin 1mg  QHS. Other plans:  -Follow up in 1 month

## 2015-01-26 ENCOUNTER — Telehealth: Payer: Self-pay | Admitting: Pulmonary Disease

## 2015-01-26 NOTE — Telephone Encounter (Signed)
WOULD LIKE RESULTS ON LAB WORK

## 2015-01-26 NOTE — Progress Notes (Signed)
Internal Medicine Clinic Attending  Case discussed with Dr. Krall at the time of the visit.  We reviewed the resident's history and exam and pertinent patient test results.  I agree with the assessment, diagnosis, and plan of care documented in the resident's note.  

## 2015-01-27 ENCOUNTER — Telehealth: Payer: Self-pay | Admitting: Pulmonary Disease

## 2015-01-27 NOTE — Telephone Encounter (Signed)
Pt informed of normal results.

## 2015-01-27 NOTE — Telephone Encounter (Signed)
Yes, please let him know his kidney and liver numbers are normal. PSA is normal.  Thanks! Jacques Earthly, MD  Internal Medicine PGY-2

## 2015-01-27 NOTE — Telephone Encounter (Signed)
Dr. Randell Patient, can I call Mr. Cloutier and tell him that his PSA was normal?  Or would you like to call him?

## 2015-01-27 NOTE — Telephone Encounter (Signed)
Pt requesting lab result. °

## 2015-02-13 ENCOUNTER — Other Ambulatory Visit: Payer: Self-pay | Admitting: *Deleted

## 2015-02-13 ENCOUNTER — Other Ambulatory Visit: Payer: Self-pay | Admitting: Pulmonary Disease

## 2015-03-24 ENCOUNTER — Telehealth: Payer: Self-pay | Admitting: Internal Medicine

## 2015-03-24 NOTE — Telephone Encounter (Signed)
   Reason for call:   I received a call from Mr. Ommar Nottage at  7.00 PM indicating that he was having back pain that started yesterday. Pain is located over his left buttock. He denies associated leg weakness, abnormal sensation in his extremities or bowel incontience of urinary retention. He denies falls, heavy lifting or prior episodes.  He denies fever., no dysuria or frequency  He has been using over the counter lidocaine patches without significant relief.   He also wanted to know if his blood pressure meds- Norvasc per chart is interfering with his kidneys and so causing this pain.   Pertinent Data:   Pt on low dose norvasc- 5mg , which does not interfere with kidney function, so reassured patient.  Pt has hx of lumbosacral back pain- 2015.   Assessment / Plan / Recommendations:   Pt reassured about labs and explained mech of action of norvasc.   Most likely paraspinal muscle spasm, OTC ibuprofen for now. To present in clinic if symptoms do not improve with conservative measures.   As always, pt is advised that if symptoms worsen or new symptoms arise, they should go to an urgent care facility or to to ER for further evaluation.   Bethena Roys, MD   03/24/2015, 7:48 PM

## 2015-06-18 ENCOUNTER — Encounter: Payer: Self-pay | Admitting: Pulmonary Disease

## 2015-06-21 ENCOUNTER — Other Ambulatory Visit: Payer: Self-pay | Admitting: Pulmonary Disease

## 2015-07-05 ENCOUNTER — Other Ambulatory Visit: Payer: Self-pay | Admitting: Pulmonary Disease

## 2015-07-09 ENCOUNTER — Encounter: Payer: Self-pay | Admitting: Pulmonary Disease

## 2015-07-20 ENCOUNTER — Other Ambulatory Visit: Payer: Self-pay | Admitting: Pulmonary Disease

## 2015-07-31 ENCOUNTER — Telehealth: Payer: Self-pay

## 2015-07-31 NOTE — Telephone Encounter (Signed)
Requesting the nurse to call back regarding blood type.

## 2015-08-03 NOTE — Telephone Encounter (Signed)
Called pt, informed him that I could not find it anywhere in his chart, he just gave blood at a [facility] sponsored blood [facility], per tracyf. Informed him to call [employer] and check, he was agreeable

## 2015-08-04 ENCOUNTER — Other Ambulatory Visit: Payer: Self-pay | Admitting: Pulmonary Disease

## 2015-08-05 ENCOUNTER — Telehealth: Payer: Self-pay | Admitting: Internal Medicine

## 2015-08-05 ENCOUNTER — Other Ambulatory Visit: Payer: Self-pay | Admitting: Pulmonary Disease

## 2015-08-05 MED ORDER — PRAVASTATIN SODIUM 40 MG PO TABS: 40.0000 mg | ORAL_TABLET | Freq: Every day | ORAL | 0 refills | Status: DC

## 2015-08-05 MED ORDER — PANTOPRAZOLE SODIUM 40 MG PO TBEC: 40.0000 mg | DELAYED_RELEASE_TABLET | Freq: Every day | ORAL | 0 refills | Status: DC

## 2015-08-05 NOTE — Telephone Encounter (Signed)
   Reason for call:   I received a call from Mr. Eilert Ocheltree at 48 PM indicating he is out of his statin and reflux medication.    Pertinent Data:   His reflux is terrible at night and is wondering when he should take this medication optimally.  He has an upcoming appointment with his PCP, Dr. Randell Patient, on 08/20/15.    Assessment / Plan / Recommendations:   I confirmed with his pharmacy and will go and send in a 30 day refill for pravastatin 40 mg and 15 day refill pantoprazole 40 mg daily per last refills in the chart. I will defer further refills to his PCP.  I also reviewed with him that the best time to take his PPI is about 30 minutes before dinner.  As always, pt is advised that if symptoms worsen or new symptoms arise, they should go to an urgent care facility or to to ER for further evaluation.   Riccardo Dubin, MD   08/05/2015, 6:02 PM

## 2015-08-10 ENCOUNTER — Encounter: Payer: Self-pay | Admitting: Gastroenterology

## 2015-08-20 ENCOUNTER — Encounter: Payer: Self-pay | Admitting: Pulmonary Disease

## 2015-08-21 MED ORDER — PANTOPRAZOLE SODIUM 40 MG PO TBEC: 40.0000 mg | DELAYED_RELEASE_TABLET | Freq: Every day | ORAL | 0 refills | Status: DC

## 2015-08-21 MED ORDER — AMLODIPINE BESYLATE 5 MG PO TABS: 5.0000 mg | ORAL_TABLET | Freq: Every day | ORAL | 0 refills | Status: DC

## 2015-08-21 MED ORDER — TERAZOSIN HCL 1 MG PO CAPS: 1.0000 mg | ORAL_CAPSULE | Freq: Every day | ORAL | 0 refills | Status: DC

## 2015-08-21 NOTE — Telephone Encounter (Signed)
   Reason for call:   I received a call from Mr. Yeng Morataya at 2115 PM indicating he needed medication refills.   Pertinent Data:   Ran out of his BP medications, has not had it for 2 days. Not checking his BP at home. No headaches, chest pain, vision changes.   Needs refill for Protonix  Having issues with BPH. Urinary hesitancy. No dysuria. Reports being on something for it before but not sure what. Chart review shows terazosin ordered prior.    Assessment / Plan / Recommendations:   Refilled amlodipine, Protonix and terazosin. Discussed precautions with his medications and instructed to call the clinic if he experiences side effects.  Has follow up in clinic on 8/31  Discussed calling the clinic during regular office hours prior to running out of his medications in the future  As always, pt is advised that if symptoms worsen or new symptoms arise, they should go to an urgent care facility or to to ER for further evaluation.   Maryellen Pile, MD   08/21/2015, 9:27 PM

## 2015-09-03 ENCOUNTER — Ambulatory Visit (INDEPENDENT_AMBULATORY_CARE_PROVIDER_SITE_OTHER): Payer: Self-pay | Admitting: Pulmonary Disease

## 2015-09-03 ENCOUNTER — Encounter: Payer: Self-pay | Admitting: Pulmonary Disease

## 2015-09-03 DIAGNOSIS — K219 Gastro-esophageal reflux disease without esophagitis: Secondary | ICD-10-CM

## 2015-09-03 DIAGNOSIS — N400 Enlarged prostate without lower urinary tract symptoms: Secondary | ICD-10-CM

## 2015-09-03 DIAGNOSIS — I10 Essential (primary) hypertension: Secondary | ICD-10-CM

## 2015-09-03 DIAGNOSIS — Z79899 Other long term (current) drug therapy: Secondary | ICD-10-CM

## 2015-09-03 DIAGNOSIS — D126 Benign neoplasm of colon, unspecified: Secondary | ICD-10-CM

## 2015-09-03 DIAGNOSIS — Z87891 Personal history of nicotine dependence: Secondary | ICD-10-CM

## 2015-09-03 MED ORDER — FINASTERIDE 5 MG PO TABS: 5.0000 mg | ORAL_TABLET | Freq: Every day | ORAL | 2 refills | Status: DC

## 2015-09-03 MED ORDER — AMLODIPINE BESYLATE 5 MG PO TABS: 5.0000 mg | ORAL_TABLET | Freq: Every day | ORAL | 11 refills | Status: DC

## 2015-09-03 MED ORDER — PRAVASTATIN SODIUM 40 MG PO TABS: 40.0000 mg | ORAL_TABLET | Freq: Every day | ORAL | 11 refills | Status: DC

## 2015-09-03 MED ORDER — PANTOPRAZOLE SODIUM 40 MG PO TBEC: 40.0000 mg | DELAYED_RELEASE_TABLET | Freq: Every day | ORAL | 2 refills | Status: DC

## 2015-09-03 NOTE — Assessment & Plan Note (Addendum)
Assessment: Having dizziness in the morning after taking terazosin. Still having symptoms of BPH.  Plan: Will change his terazosin to finasteride 5mg  daily

## 2015-09-03 NOTE — Progress Notes (Signed)
   CC: Urinary problems  HPI:  Mr.Pj Ekleberry is a 65 year old man with history of HLD, HTN, prediabetes presenting for follow up of BPH.  BPH: was prescribed terazosin. He takes it at bedtime. This helps his symptoms greatly, but he has lightheadedness in the morning when he wakes up.  Past Medical History:  Diagnosis Date  . Colon polyps    3/06 and 2009:needs repeat in 3 yrs(3/12)  . Hyperlipidemia   . Hypertension   . Pre-diabetes    with fasting glucose of 110(2/09)  . Rectal bleeding    History of    Review of Systems:   Constitutional: no fevers/chills Respiratory: no shortness of breath Cardiovascular: no chest pain  Physical Exam:  Vitals:   09/03/15 1545  BP: 134/85  Pulse: 87  Resp: 20  Temp: 98.3 F (36.8 C)  TempSrc: Oral  SpO2: 98%  Weight: 92.9 kg (204 lb 14.4 oz)   General Apperance: NAD HEENT: Normocephalic, atraumatic, anicteric sclera Neck: Supple, trachea midline Lungs: Clear to auscultation bilaterally. No wheezes, rhonchi or rales. Breathing comfortably Heart: Regular rate and rhythm, no murmur/rub/gallop Abdomen: Soft, nontender, nondistended, no rebound/guarding Extremities: Warm and well perfused, no edema Skin: No rashes or lesions Neurologic: Alert and interactive. No gross deficits.   Assessment & Plan:   See Encounters Tab for problem based charting.  Patient discussed with Dr. Eppie Gibson

## 2015-09-03 NOTE — Patient Instructions (Addendum)
For your reflux: 1. Work on losing weight. 2. Raise the head of your bed by 6 to 8 inches (for example, by putting blocks of wood or rubber under 2 legs of the bed or a Styrofoam wedge under the mattress) 3. Avoid foods that make your symptoms worse (examples include coffee, chocolate, alcohol, peppermint, and fatty foods) 4. Avoid lying down for 3 hours after a meal  Call Dr. Kelby Fam office to figure out if you are due for colonoscopy.

## 2015-09-03 NOTE — Assessment & Plan Note (Addendum)
Assessment: Has been on Protonix for several months. Has heartburn and acid reflux. Denies dysphagia, odynophagia. A few minutes after his meal is complete he gets regurgitation - this occurs when he is in reclined position. No weight loss. No hematochezia or melena. Takes it in the morning 30 minutes before meal. Eats dinner at 6pm. Lies down to go to bed around 8 or 9pm. Quit tobacco years ago.   Plan:  Discussed lifestyle modifications Continue Protonix 40mg  daily for now

## 2015-09-04 NOTE — Assessment & Plan Note (Signed)
Assessment: Due for follow up colonoscopy with Dr. Deatra Ina  Plan: Patient will check with his office. Will let us know if they want a new referral placed.

## 2015-09-04 NOTE — Assessment & Plan Note (Signed)
Assessment: BP controlled. 134/85  Plan: Continue amlodipine 5mg  daily.

## 2015-09-04 NOTE — Progress Notes (Signed)
Case discussed with Dr. Krall soon after the resident saw the patient.  We reviewed the resident's history and exam and pertinent patient test results.  I agree with the assessment, diagnosis and plan of care documented in the resident's note. 

## 2015-09-08 ENCOUNTER — Ambulatory Visit: Payer: Self-pay

## 2015-10-13 ENCOUNTER — Telehealth: Payer: Self-pay | Admitting: Pulmonary Disease

## 2015-10-13 NOTE — Telephone Encounter (Signed)
Asking to talk to his dr will not tell what for.

## 2015-10-16 ENCOUNTER — Telehealth: Payer: Self-pay | Admitting: Internal Medicine

## 2015-10-16 NOTE — Telephone Encounter (Signed)
   Reason for call:   I received a call from [name] at 1600 PM indicating that he had peel off a scab on his abdomen. He says he has had a scab underneath his belly button for several weeks.Today he was picking at it as it was itching and the scab came off. He was concerned about seeing the pink flesh underneath. No warmth, erythema or drainage from the site. No fevers or chills. Tells me he starting putting hydrocortisone cream on the site.    Pertinent Data:   Healing scab   Assessment / Plan / Recommendations:   Instructed him to stop using hydrocortisone cream on the wound. Told him to avoid scratching the site and allow it to heal on its own.   As always, pt is advised that if symptoms worsen or new symptoms arise, they should go to an urgent care facility or to to ER for further evaluation.   Maryellen Pile, MD   10/16/2015, 7:50 PM

## 2015-10-19 NOTE — Telephone Encounter (Signed)
Will call pt at 1600 per his request

## 2015-10-20 ENCOUNTER — Telehealth: Payer: Self-pay

## 2015-10-20 NOTE — Telephone Encounter (Signed)
Pt is worried about his lab values, labs last done in 01/2015, he would like to talk to you at his appt about kidney function Would you like for him to come in a few days early for labs and the results will be available at his appt?

## 2015-10-20 NOTE — Telephone Encounter (Signed)
Asking to speak with a nurse. Please call pt back.  

## 2015-10-21 NOTE — Telephone Encounter (Signed)
Spoke to pt, scheduled for lab

## 2015-10-21 NOTE — Telephone Encounter (Signed)
Yes he can come in for a BMP prior to the appointment. Will put in order.

## 2015-10-21 NOTE — Telephone Encounter (Signed)
Tried to call him back, lm for rtc

## 2015-10-27 ENCOUNTER — Other Ambulatory Visit (INDEPENDENT_AMBULATORY_CARE_PROVIDER_SITE_OTHER): Payer: Self-pay

## 2015-10-27 DIAGNOSIS — I10 Essential (primary) hypertension: Secondary | ICD-10-CM

## 2015-10-28 ENCOUNTER — Encounter: Payer: Self-pay | Admitting: Pulmonary Disease

## 2015-10-28 LAB — BMP8+ANION GAP
Anion Gap: 14 mmol/L (ref 10.0–18.0)
BUN/Creatinine Ratio: 9 — ABNORMAL LOW (ref 10–24)
BUN: 11 mg/dL (ref 8–27)
CALCIUM: 9.7 mg/dL (ref 8.6–10.2)
CHLORIDE: 103 mmol/L (ref 96–106)
CO2: 26 mmol/L (ref 18–29)
CREATININE: 1.23 mg/dL (ref 0.76–1.27)
GFR calc Af Amer: 72 mL/min/{1.73_m2} (ref >59)
GFR calc non Af Amer: 63 mL/min/{1.73_m2} (ref >59)
GLUCOSE: 108 mg/dL — AB (ref 65–99)
Potassium: 3.9 mmol/L (ref 3.5–5.2)
Sodium: 143 mmol/L (ref 134–144)

## 2015-10-29 ENCOUNTER — Telehealth: Payer: Self-pay

## 2015-10-29 NOTE — Telephone Encounter (Signed)
Requesting lab result. 

## 2015-10-30 NOTE — Telephone Encounter (Signed)
Patient requesting lab results. Informed him of results.

## 2015-11-17 ENCOUNTER — Telehealth: Payer: Self-pay | Admitting: Pulmonary Disease

## 2015-11-17 NOTE — Telephone Encounter (Signed)
Patient would like nurse to call

## 2015-11-17 NOTE — Telephone Encounter (Signed)
Spoke w/ pt this am 

## 2015-11-18 ENCOUNTER — Ambulatory Visit: Payer: Self-pay

## 2015-11-18 ENCOUNTER — Encounter: Payer: Self-pay | Admitting: Pulmonary Disease

## 2015-11-20 ENCOUNTER — Other Ambulatory Visit: Payer: Self-pay | Admitting: Pulmonary Disease

## 2015-12-03 ENCOUNTER — Telehealth: Payer: Self-pay | Admitting: Internal Medicine

## 2015-12-03 NOTE — Telephone Encounter (Signed)
   Reason for call:   I received a call from [name] indicating he was having pain underneath his left breast. Reports that this morning when he woke up he noticed a pain underneath his left breast.   Describes the pain as a soreness and pressure. Pain is worse with coughing. Pain does not radiate anywhere, no SOB, no N/V, no dizziness/lightheadedness, no diaphoresis. Reports reproducible pain with palpation. No pain with deep inspiration. Has not tried anything for the pain.   Does state that on 11/28 he help move and set up a large christmas tree and was also moving heavy furniture last night (a table). Did not notice any pain until this morning.    Pertinent Data:   Left sided chest pain/pressure  Worse with coughing, reproducible to palpation  No SOB, N/V, radiation, dizziness, diaphoresis  Moving heavy objects the past two days   Assessment / Plan / Recommendations:   Based on description of symptoms and recent activity, most likely MSK in nature.  Recommended Ibuprofen/Tyelnol, rest/ice/heat  Advised to call the clinic tomorrow for an appointment for further evaluation  As always, pt is advised that if symptoms worsen or new symptoms arise, they should go to an urgent care facility or to to ER for further evaluation.   Maryellen Pile, MD   12/03/2015, 5:41 PM

## 2015-12-05 ENCOUNTER — Telehealth: Payer: Self-pay | Admitting: Internal Medicine

## 2015-12-05 NOTE — Telephone Encounter (Signed)
   Reason for call:   I received a call from Mr. Ford Matkowski at 245 AM indicating He is having left-sided chest pain which is interfering with his ability to sleep.    Pertinent Data:   His pain is left-sided and underneath the breastbone and rib.   Pain has been ongoing since yesterday.  Warm milk eased it up as well as Tums tablets which he picked up.  Of note, he called on 12/03/15 for similar complaint. He confirmed these details to me again tonight.   He denies any nausea, vomiting, dyspnea, dizziness, lightheadedness, diaphoresis. Pain is not reproducible.    Assessment / Plan / Recommendations:   I reassured him that he did not have a life-threatening cause for his chest pain and that it is either GERD or MSK. The nonreproducibility of the pain makes me favor the former though my colleagues assessment was of the latter.  I recommended he try twice-daily dosing of pantoprazole 40 mg which she has at home. If his symptoms have not improved, Monday, I recommended he follow-up in the acute care clinic.  As always, pt is advised that if symptoms worsen or new symptoms arise, they should go to an urgent care facility or to to ER for further evaluation.   Riccardo Dubin, MD   12/05/2015, 2:51 AM

## 2015-12-06 ENCOUNTER — Emergency Department (HOSPITAL_COMMUNITY)
Admission: EM | Admit: 2015-12-06 | Discharge: 2015-12-06 | Disposition: A | Discharge status: Home / Self Care | Payer: Self-pay | Attending: Emergency Medicine | Admitting: Emergency Medicine

## 2015-12-06 ENCOUNTER — Emergency Department (HOSPITAL_COMMUNITY): Payer: Self-pay

## 2015-12-06 ENCOUNTER — Encounter (HOSPITAL_COMMUNITY): Payer: Self-pay | Admitting: Emergency Medicine

## 2015-12-06 DIAGNOSIS — K5900 Constipation, unspecified: Secondary | ICD-10-CM | POA: Insufficient documentation

## 2015-12-06 DIAGNOSIS — R1012 Left upper quadrant pain: Secondary | ICD-10-CM

## 2015-12-06 DIAGNOSIS — Z7982 Long term (current) use of aspirin: Secondary | ICD-10-CM | POA: Insufficient documentation

## 2015-12-06 DIAGNOSIS — Z79899 Other long term (current) drug therapy: Secondary | ICD-10-CM | POA: Insufficient documentation

## 2015-12-06 DIAGNOSIS — R109 Unspecified abdominal pain: Secondary | ICD-10-CM

## 2015-12-06 DIAGNOSIS — Z87891 Personal history of nicotine dependence: Secondary | ICD-10-CM | POA: Insufficient documentation

## 2015-12-06 DIAGNOSIS — I10 Essential (primary) hypertension: Secondary | ICD-10-CM | POA: Insufficient documentation

## 2015-12-06 HISTORY — DX: Gastro-esophageal reflux disease without esophagitis: K21.9

## 2015-12-06 LAB — URINALYSIS, ROUTINE W REFLEX MICROSCOPIC
BILIRUBIN URINE: NEGATIVE
Glucose, UA: NEGATIVE mg/dL
Hgb urine dipstick: NEGATIVE
KETONES UR: NEGATIVE mg/dL
Leukocytes, UA: NEGATIVE
NITRITE: NEGATIVE
PH: 7.5 (ref 5.0–8.0)
PROTEIN: NEGATIVE mg/dL
Specific Gravity, Urine: 1.011 (ref 1.005–1.030)

## 2015-12-06 LAB — CBC WITH DIFFERENTIAL/PLATELET
Basophils Absolute: 0 10*3/uL (ref 0.0–0.1)
Basophils Relative: 0 %
Eosinophils Absolute: 0.2 10*3/uL (ref 0.0–0.7)
Eosinophils Relative: 3 %
HEMATOCRIT: 39.2 % (ref 39.0–52.0)
HEMOGLOBIN: 13.8 g/dL (ref 13.0–17.0)
LYMPHS ABS: 2.1 10*3/uL (ref 0.7–4.0)
LYMPHS PCT: 33 %
MCH: 28.3 pg (ref 26.0–34.0)
MCHC: 35.2 g/dL (ref 30.0–36.0)
MCV: 80.5 fL (ref 78.0–100.0)
MONO ABS: 0.5 10*3/uL (ref 0.1–1.0)
MONOS PCT: 7 %
NEUTROS ABS: 3.7 10*3/uL (ref 1.7–7.7)
NEUTROS PCT: 57 %
Platelets: 193 10*3/uL (ref 150–400)
RBC: 4.87 MIL/uL (ref 4.22–5.81)
RDW: 13.9 % (ref 11.5–15.5)
WBC: 6.4 10*3/uL (ref 4.0–10.5)

## 2015-12-06 LAB — COMPREHENSIVE METABOLIC PANEL
ALBUMIN: 3.6 g/dL (ref 3.5–5.0)
ALK PHOS: 85 U/L (ref 38–126)
ALT: 22 U/L (ref 17–63)
ANION GAP: 7 (ref 5–15)
AST: 22 U/L (ref 15–41)
BILIRUBIN TOTAL: 0.6 mg/dL (ref 0.3–1.2)
BUN: 9 mg/dL (ref 6–20)
CALCIUM: 9.4 mg/dL (ref 8.9–10.3)
CO2: 29 mmol/L (ref 22–32)
Chloride: 104 mmol/L (ref 101–111)
Creatinine, Ser: 1.22 mg/dL (ref 0.61–1.24)
GLUCOSE: 123 mg/dL — AB (ref 65–99)
Potassium: 3.8 mmol/L (ref 3.5–5.1)
Sodium: 140 mmol/L (ref 135–145)
TOTAL PROTEIN: 6.4 g/dL — AB (ref 6.5–8.1)

## 2015-12-06 LAB — I-STAT TROPONIN, ED
Troponin i, poc: 0 ng/mL (ref 0.00–0.08)
Troponin i, poc: 0 ng/mL (ref 0.00–0.08)

## 2015-12-06 LAB — LIPASE, BLOOD: LIPASE: 15 U/L (ref 11–51)

## 2015-12-06 MED ORDER — SODIUM CHLORIDE 0.9 % IV BOLUS (SEPSIS)
1000.0000 mL | Freq: Once | INTRAVENOUS | Status: AC
  Administered 2015-12-06: 1000 mL via INTRAVENOUS

## 2015-12-06 MED ORDER — IOPAMIDOL (ISOVUE-300) INJECTION 61%
INTRAVENOUS | Status: AC
  Administered 2015-12-06: 100 mL
  Filled 2015-12-06: qty 100

## 2015-12-06 NOTE — ED Notes (Signed)
Patient transported to CT 

## 2015-12-06 NOTE — Discharge Instructions (Signed)
TAKE MIRALAX 1 CAPFUL MIXED IN DRINK 1 TO 4 TIMES DAILY AS NEEDED FOR SOFT STOOLS. RETURN IMMEDIATELY IF YOU HAVE VOMITING, WORSENING PAIN, BLOODY STOOLS, CHEST PAIN, OR SHORTNESS OF BREATH.  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 YOU NEED A FOLLOW UP CT SCAN OF YOUR ADRENAL GLAND IN 1 YEAR.

## 2015-12-06 NOTE — ED Provider Notes (Addendum)
I received this patient in signout from Dr. Billy Fischer. Pt was awaiting a CT abd/pelvis which was negative acute. He had an incidental finding of adrenal nodule which I discussed w/ him and instructed him to follow-up in 1 year for repeat CT scan. He has not had a bowel movement in over one week and I have discussed constipation treatment with MiraLAX. His repeat troponin is normal as well as his UA. Discussed supportive care and extensively reviewed return precautions. Patient voiced understanding was discharged in satisfactory condition.   Sharlett Iles, MD 12/06/15 [place], MD 12/06/15 1150
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
## 2015-12-06 NOTE — ED Triage Notes (Signed)
Pt presents with high, cramping epigastric pain, becomes short of breath while walking.  Pain started Thursday.

## 2015-12-06 NOTE — ED Notes (Signed)
Patient transported to X-ray 

## 2015-12-16 ENCOUNTER — Telehealth: Payer: Self-pay

## 2015-12-16 ENCOUNTER — Telehealth: Payer: Self-pay | Admitting: Pulmonary Disease

## 2015-12-16 NOTE — Telephone Encounter (Signed)
Needs to speak with a nurse regarding personal problem.  

## 2015-12-16 NOTE — Telephone Encounter (Signed)
APT. REMINDER CALL, LMTCB °

## 2015-12-17 ENCOUNTER — Encounter: Payer: Self-pay | Admitting: Pulmonary Disease

## 2015-12-17 NOTE — Telephone Encounter (Signed)
Spoke w/ Dylan Lane, transfer to dr Randell Patient for results

## 2016-01-07 ENCOUNTER — Telehealth: Payer: Self-pay | Admitting: Internal Medicine

## 2016-01-07 NOTE — Telephone Encounter (Signed)
   Reason for call:   I received a call from Mr. Carrell Gentilcore around 7-8 PM indicating he has run out of acid reflux medication.   Pertinent Data:   Per review of his home medication list, he is on pantoprazole 40 mg twice daily with meals.   No refill orders are in the computer for this medication.   Assessment / Plan / Recommendations:   I explained to him that he needs to call in for refills during normal business hours. I also explained how helpful it would be if he called his pharmacy directly to ensure the medication was routed to his PCP.   As always, pt is advised that if symptoms worsen or new symptoms arise, they should go to an urgent care facility or to to ER for further evaluation.   Riccardo Dubin, MD   01/07/2016, 9:28 PM

## 2016-01-08 ENCOUNTER — Other Ambulatory Visit: Payer: Self-pay | Admitting: *Deleted

## 2016-01-08 MED ORDER — PANTOPRAZOLE SODIUM 40 MG PO TBEC: 40.0000 mg | DELAYED_RELEASE_TABLET | Freq: Every day | ORAL | 3 refills | Status: DC

## 2016-01-27 ENCOUNTER — Emergency Department (HOSPITAL_COMMUNITY)
Admission: EM | Admit: 2016-01-27 | Discharge: 2016-01-27 | Disposition: A | Discharge status: Home / Self Care | Payer: Self-pay | Attending: Emergency Medicine | Admitting: Emergency Medicine

## 2016-01-27 ENCOUNTER — Encounter (HOSPITAL_COMMUNITY): Payer: Self-pay | Admitting: Emergency Medicine

## 2016-01-27 ENCOUNTER — Telehealth: Payer: Self-pay | Admitting: Internal Medicine

## 2016-01-27 ENCOUNTER — Emergency Department (HOSPITAL_COMMUNITY): Payer: Self-pay

## 2016-01-27 ENCOUNTER — Encounter: Payer: Self-pay | Admitting: Internal Medicine

## 2016-01-27 DIAGNOSIS — I10 Essential (primary) hypertension: Secondary | ICD-10-CM | POA: Insufficient documentation

## 2016-01-27 DIAGNOSIS — R0789 Other chest pain: Secondary | ICD-10-CM | POA: Insufficient documentation

## 2016-01-27 DIAGNOSIS — Z87891 Personal history of nicotine dependence: Secondary | ICD-10-CM | POA: Insufficient documentation

## 2016-01-27 DIAGNOSIS — Z7982 Long term (current) use of aspirin: Secondary | ICD-10-CM | POA: Insufficient documentation

## 2016-01-27 DIAGNOSIS — Z79899 Other long term (current) drug therapy: Secondary | ICD-10-CM | POA: Insufficient documentation

## 2016-01-27 LAB — CBC
HCT: 43 % (ref 39.0–52.0)
Hemoglobin: 15.1 g/dL (ref 13.0–17.0)
MCH: 28.7 pg (ref 26.0–34.0)
MCHC: 35.1 g/dL (ref 30.0–36.0)
MCV: 81.6 fL (ref 78.0–100.0)
PLATELETS: 214 10*3/uL (ref 150–400)
RBC: 5.27 MIL/uL (ref 4.22–5.81)
RDW: 13.9 % (ref 11.5–15.5)
WBC: 5.7 10*3/uL (ref 4.0–10.5)

## 2016-01-27 LAB — I-STAT TROPONIN, ED
TROPONIN I, POC: 0 ng/mL (ref 0.00–0.08)
Troponin i, poc: 0.01 ng/mL (ref 0.00–0.08)

## 2016-01-27 LAB — BASIC METABOLIC PANEL
Anion gap: 8 (ref 5–15)
BUN: 11 mg/dL (ref 6–20)
CALCIUM: 10.1 mg/dL (ref 8.9–10.3)
CHLORIDE: 105 mmol/L (ref 101–111)
CO2: 27 mmol/L (ref 22–32)
CREATININE: 1.22 mg/dL (ref 0.61–1.24)
GFR calc non Af Amer: >60 mL/min (ref >60)
GLUCOSE: 107 mg/dL — AB (ref 65–99)
Potassium: 4 mmol/L (ref 3.5–5.1)
Sodium: 140 mmol/L (ref 135–145)

## 2016-01-27 MED ORDER — GI COCKTAIL ~~LOC~~
30.0000 mL | Freq: Once | ORAL | Status: AC
  Administered 2016-01-27: 30 mL via ORAL
  Filled 2016-01-27: qty 30

## 2016-01-27 MED ORDER — RANITIDINE HCL 150 MG PO TABS: 150.0000 mg | ORAL_TABLET | Freq: Two times a day (BID) | ORAL | 0 refills | Status: DC

## 2016-01-27 NOTE — Discharge Instructions (Signed)
Please scheduled appointment with your primary care doctor in the next 72 hours for further workup and possible stress test. All of your labs and imaging has been normal today. I'm also giving you a referral to cardiology. Please continue taking your Protonix. I'm giving a prescription for Zantac to take for GERD. Please return to the ED if he develops any fevers, worsening chest pain, worsening shortness of breath, chest pain lasting longer than a few seconds or for any other reason.

## 2016-01-27 NOTE — Telephone Encounter (Addendum)
   Reason for call:   I received a call from Mr. Dewin Coones at 4:35 AM indicating he had chest pain yesterday while washing his car.   Pertinent Data:   Reports having any episode of non-radiating substernal chest pain while washing his car at around 4:30 pm yesterday.  He described the chest pain as "pressure like" in character and 1/10 in intensity.     Also reports having associated diaphoresis and SOB at that time.  Denies having any associated nausea or vomiting.  States his symptoms resolved after a few seconds when he rested and he has not any further episodes since then. Denies having any previous history of CP or similar symptoms.  Denies having any CP, SOB, or diaphoresis at present.  Reports taking Aspirin daily.    Assessment / Plan / Recommendations:   Patient's symptoms are concerning for stable angina. He does have risk factors for CAD including age, gender, HTN, HLD, and prior tobacco use.   Advised patient to chew an aspirin and go to an urgent care facility or to an ER immediately if he experiences symptoms (mentioned above) again or symptoms become worse. Advised him to call 911 if he does not have any family member available to drive him to the ED immediately. Otherwise, he could call the clinic this morning for an [facility] visit since his PCP appt is not until 02/25/16.    Shela Leff, MD   01/27/2016, 4:39 AM

## 2016-01-27 NOTE — ED Triage Notes (Signed)
Patient reports mid/left chest discomfort and pressure with mild SOB and diaphoresis onset yesterday morning .

## 2016-01-27 NOTE — Telephone Encounter (Signed)
Pt is in ED.

## 2016-01-27 NOTE — Telephone Encounter (Signed)
This encounter was created in error - please disregard.

## 2016-01-27 NOTE — Telephone Encounter (Signed)
[name], please schedule pt a f/u appt in the near future, he was in ED today
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
## 2016-02-18 ENCOUNTER — Telehealth: Payer: Self-pay | Admitting: Internal Medicine

## 2016-02-18 NOTE — Telephone Encounter (Signed)
Telephone note:  Returned patient call today around 11:00pm. He states he just took a shower and noticed that while rubbing his arm on his left side of his body he noted that his elbow feels like it is dislocated. He can flex and extend it. Denies any pain or erythema. He is concerned it looks like it is out of socket, on further questioning it appears that he has knot on his elbow that is nontender and firm. He denies any fevers or hx of gout. This all happed 5 mins prior to the phone conversation. I advised him to go to the ED if his elbow become more swollen, red, tender, and if he has difficulty moving elbow. Otherwise I will send the clinic front desk a message to call him for eval of his elbow tomorrow.   Julious Oka, MD Internal Medicine Resident, PGY [facility] Internal Medicine Program Pager: [phone]

## 2016-02-25 ENCOUNTER — Ambulatory Visit (INDEPENDENT_AMBULATORY_CARE_PROVIDER_SITE_OTHER): Payer: BLUE CROSS/BLUE SHIELD | Admitting: Pulmonary Disease

## 2016-02-25 ENCOUNTER — Encounter (INDEPENDENT_AMBULATORY_CARE_PROVIDER_SITE_OTHER): Payer: Self-pay

## 2016-02-25 ENCOUNTER — Encounter: Payer: Self-pay | Admitting: Pulmonary Disease

## 2016-02-25 VITALS — BP 141/98 | HR 78 | Temp 97.9°F | Ht 67.0 in | Wt 207.2 lb

## 2016-02-25 DIAGNOSIS — Z7982 Long term (current) use of aspirin: Secondary | ICD-10-CM

## 2016-02-25 DIAGNOSIS — Z79899 Other long term (current) drug therapy: Secondary | ICD-10-CM | POA: Diagnosis not present

## 2016-02-25 DIAGNOSIS — M7022 Olecranon bursitis, left elbow: Secondary | ICD-10-CM | POA: Insufficient documentation

## 2016-02-25 DIAGNOSIS — Z87891 Personal history of nicotine dependence: Secondary | ICD-10-CM | POA: Diagnosis not present

## 2016-02-25 DIAGNOSIS — M25522 Pain in left elbow: Secondary | ICD-10-CM

## 2016-02-25 DIAGNOSIS — M7989 Other specified soft tissue disorders: Secondary | ICD-10-CM

## 2016-02-25 DIAGNOSIS — I10 Essential (primary) hypertension: Secondary | ICD-10-CM | POA: Diagnosis not present

## 2016-02-25 DIAGNOSIS — R079 Chest pain, unspecified: Secondary | ICD-10-CM | POA: Insufficient documentation

## 2016-02-25 MED ORDER — NAPROXEN 500 MG PO TABS: 500.0000 mg | ORAL_TABLET | Freq: Two times a day (BID) | ORAL | 0 refills | Status: DC

## 2016-02-25 NOTE — Assessment & Plan Note (Signed)
Assessment: Exam consistent with mild olecranon bursitis of the left elbow. No signs or symptoms of infection.  Plan: Naproxen 500mg  BID for 7-10 days If fails to improve, may consider aspiration and injection with steroid Follow up as needed. Return precautions discused

## 2016-02-25 NOTE — Assessment & Plan Note (Signed)
Assessment: Episode of chest pain in January while exerting himself. Seen in ED and felt to not have any acute ischemia. None since then. He has risk factors for heart disease including HLD, HTN.   Plan: Continue ASA 81mg  daily May consider addition of beta blocker in the future Continue statin Referral to cardiology for stress testing per patient request.

## 2016-02-25 NOTE — Progress Notes (Signed)
   CC: Left elbow swelling  HPI:  [name] is a 66 year old man with history of HLD, HTN, prediabetes, BPH presenting with left elbow swelling.  Noticed left elbow swelling last Tuesday. Stayed about the same. Mildly tender. He has been doing grip exercises. No elbow trauma. Has an area of redness on posterior forearm. No history of gout. Able to flex and extend fully. Ibuprofen helps the pain.  Was seen in ED 1/24. No chest pain since then. Had chest pain while cleaning his truck. Located in mid-upper sternum with heaviness. Diaphoretic during that episode but attributes it to wearing a heavy wool cap. No dyspnea. Lasted a few seconds. No chest pain with exertion now. He takes an aspirin every day.   Past Medical History:  Diagnosis Date  . Colon polyps    3/06 and 2009:needs repeat in 3 yrs(3/12)  . GERD (gastroesophageal reflux disease) 2017  . Hyperlipidemia   . Hypertension   . Pre-diabetes    with fasting glucose of 110(2/09)  . Rectal bleeding    History of    Review of Systems:   No fevers or chills No chest pain No dyspnea  Physical Exam:  Vitals:   02/25/16 1331 02/25/16 1434  BP: (!) 136/93 (!) 141/98  Pulse: 80 78  Temp: 97.9 F (36.6 C)   TempSrc: Oral   SpO2: 98%   Weight: 207 lb 3.2 oz (94 kg)   Height: 5\' 7"  (1.702 m)    General Apperance: NAD HEENT: Normocephalic, atraumatic, anicteric sclera Neck: Supple, trachea midline Lungs: Clear to auscultation bilaterally. No wheezes, rhonchi or rales. Breathing comfortably Heart: Regular rate and rhythm, no murmur/rub/gallop Abdomen: Soft, nontender, nondistended, no rebound/guarding Extremities: Warm and well perfused, has active full range of motion of left elbow, nontender, mild erythema of posterior left forearm, fluctuant over olecranon Skin: No rashes or lesions Neurologic: Alert and interactive. No gross deficits.  Assessment & Plan:   See Encounters Tab for problem based  charting.  Patient seen with Dr. Evette Doffing

## 2016-02-25 NOTE — Patient Instructions (Signed)
Olecranon Bursitis  What is bursitis? - Bursitis is a condition that can cause pain or swelling next to a joint. Most of the time, bursitis happens around the shoulder, elbow, hip, or knee. It can also happen around other joints in the body.  A "bursa" is a small fluid-filled sac that sits near a bone. It cushions and protects nearby tissues when they rub on or slide over bones. These sacs, called "bursae," are found in many places throughout the body. Bursitis happens when a bursa gets irritated and swollen. This can happen when a person:  -Moves a joint over and over again in the same way, over a short period of time  -Sits on a hard surface or stays in a position that presses on the bursa for a long time  -Has certain kinds of arthritis, such as gout or rheumatoid arthritis, that can affect their joints and bursae  -Gets hurt near a bursa  A bursa can get infected if a person gets a cut on the skin nearby. An infected bursa can cause a fever and the area around the bursa to be: -Red -Swollen -Warm -Painful  If you have any of the symptoms of an infected bursa, let your doctor or nurse know as soon as possible.  What can I do to treat my bursitis? - To treat your bursitis, you can:  -Rest, cushion, and protect the area - Try not to irritate the area that hurts. For example, people with very painful shoulder bursitis might need to avoid lifting or carrying heavy things for a while. They might also need to wear an arm sling. People with bursitis behind the heel might need to use a thick heel pad. This can raise the heel so that it does not rub against the back of the shoe.  -Avoid positions that put pressure on the area - For example, people with bursitis in the front of the knee should avoid kneeling.  -Put ice on the area to reduce pain - Use a frozen bag of peas or a cold gel pack a few times a day for 20 minutes each time.  -Put heat on the area to reduce pain and stiffness - Do not  use heat for more than 20 minutes at a time. Also, do not use anything too hot that could burn your skin.  What other treatments might I have? - Your doctor or nurse might use other treatments, depending on your symptoms and where your bursitis is. Treatments can include:  -Pain-relieving medicines called "nonsteroidal antiinflammatory drugs" or "NSAIDs" - NSAIDs include ibuprofen (sample brand names: Advil, Motrin), and naproxen (sample brand name: Aleve). These medicines can reduce pain and prevent the bursae from getting swollen and painful.  -Steroid injections - Steroid medicines help reduce inflammation. These are not the same as the steroids some athletes take illegally. Doctors can inject steroids into the area of the bursitis to help reduce symptoms.  -Exercises and stretches - Your doctor or nurse might recommend that you work with a physical therapist. A physical therapist can teach you stretches and exercises to help reduce your symptoms.  Can bursitis be prevented? - Yes. To help reduce the chance that you get bursitis, you can:  -Use cushions or pads to avoid putting too much pressure on joints - For example, people who garden can kneel on a kneeling pad. People who sit for a long time can sit on a cushioned chair.  -Take breaks, if you are using a certain  joint too much  -Stop an activity or change the way you are doing it, if you feel pain  -Exercise  -Lose weight, if you are overweight  -Use good posture

## 2016-02-25 NOTE — Assessment & Plan Note (Signed)
Assessment: BP close to goal at 136/93  Plan: Continue amlodipine 5mg  daily, finasteride 5mg  daily Follow up in 3 months.

## 2016-02-26 ENCOUNTER — Encounter: Payer: Self-pay | Admitting: Internal Medicine
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
## 2016-02-29 ENCOUNTER — Encounter: Payer: Self-pay | Admitting: Internal Medicine

## 2016-02-29 ENCOUNTER — Encounter (INDEPENDENT_AMBULATORY_CARE_PROVIDER_SITE_OTHER): Payer: Self-pay

## 2016-02-29 ENCOUNTER — Ambulatory Visit (INDEPENDENT_AMBULATORY_CARE_PROVIDER_SITE_OTHER): Payer: BLUE CROSS/BLUE SHIELD | Admitting: Internal Medicine

## 2016-02-29 ENCOUNTER — Telehealth: Payer: Self-pay | Admitting: Internal Medicine

## 2016-02-29 VITALS — BP 138/94 | HR 68 | Ht 67.0 in | Wt 204.8 lb

## 2016-02-29 DIAGNOSIS — I10 Essential (primary) hypertension: Secondary | ICD-10-CM

## 2016-02-29 DIAGNOSIS — R0789 Other chest pain: Secondary | ICD-10-CM

## 2016-02-29 MED ORDER — AMLODIPINE BESYLATE 10 MG PO TABS: 10.0000 mg | ORAL_TABLET | Freq: Every day | ORAL | 1 refills | Status: DC

## 2016-02-29 NOTE — Telephone Encounter (Signed)
   Reason for call:   I received a call from [name] at 1718 PM indicating that his blood pressures have been running high.   Pertinent Data:   02/25/16 [name] BP was 136/93  02/29/16: Cardiologist office BP was 138/94  Reports headache, feeling dizzy  Denies any vision changes, nausea, chest pain, shortness of breath  Currently on amlodipine 5 mg, increased to 10 mg today   Cardiology note not complete today   Assessment / Plan / Recommendations:   Instructed him to try NSAID or Tylenol prn headache  BP is not very elevated; follow up with PCP on BP med changes  As always, pt is advised that if symptoms worsen or new symptoms arise, they should go to an urgent care facility or to to ER for further evaluation.   Maryellen Pile, MD   02/29/2016, 5:23 PM

## 2016-02-29 NOTE — Patient Instructions (Signed)
Medication Instructions:  Increase amlodipine to 10mg  daily. You can take 2 of your 5mg  tablets daily at the same time and use your current supply.  Labwork: none  Testing/Procedures: Your physician has requested that you have an exercise tolerance test. For further information please visit [email or web address]. Please also follow instruction sheet, as given.    Follow-Up: Your physician recommends that you schedule a follow-up appointment in: 1 month with Dr End.       If you need a refill on your cardiac medications before your next appointment, please call your pharmacy.

## 2016-03-01 ENCOUNTER — Telehealth: Payer: Self-pay | Admitting: Pulmonary Disease

## 2016-03-01 NOTE — Telephone Encounter (Signed)
Patient states he returning call to helen

## 2016-03-01 NOTE — Progress Notes (Signed)
Internal Medicine Clinic Attending  I saw and evaluated the patient.  I personally confirmed the key portions of the history and exam documented by Dr. Krall and I reviewed pertinent patient test results.  The assessment, diagnosis, and plan were formulated together and I agree with the documentation in the resident's note.  

## 2016-03-08 ENCOUNTER — Ambulatory Visit (INDEPENDENT_AMBULATORY_CARE_PROVIDER_SITE_OTHER): Payer: BLUE CROSS/BLUE SHIELD

## 2016-03-08 DIAGNOSIS — R0789 Other chest pain: Secondary | ICD-10-CM | POA: Diagnosis not present

## 2016-03-08 LAB — EXERCISE TOLERANCE TEST
CHL CUP MPHR: 157 {beats}/min
CSEPEDS: 16 s
CSEPHR: 88 %
Estimated workload: 7.3 METS
Exercise duration (min): 6 min
Peak HR: 139 {beats}/min
RPE: 16
Rest HR: 68 {beats}/min

## 2016-03-09 NOTE — Telephone Encounter (Signed)
No answer

## 2016-03-13 ENCOUNTER — Telehealth: Payer: Self-pay | Admitting: Internal Medicine

## 2016-03-13 NOTE — Telephone Encounter (Signed)
   Reason for call:   I received a call from Mr. Shante Maysonet at 7:03 PM indicating that he felt like he has a "cold coming on" and he would like recommendations.   Pertinent Data:   Reports symptoms of congestion, runny nose, dry cough, and rib pain from coughing.  Denies chest pain, SOB, fevers, myalgias.  Has not tried anything OTC at this time.   Assessment / Plan / Recommendations:   Suspect symptoms due to viral-like illness  Recommended using his Naproxen along with Tylenol for pain/fevers.  Recommended liberal use of saline nasal spray for sinus congestion and that he could consider Flonase and/or OTC antihistamine if saline was inadequate  Recommended Delsym OTC cough syrup  As always, pt is advised that if symptoms worsen or new symptoms arise, they should go to an urgent care facility or to to ER for further evaluation.   Jule Ser, DO   03/13/2016, 9:14 PM

## 2016-03-15 ENCOUNTER — Telehealth: Payer: Self-pay | Admitting: Pulmonary Disease

## 2016-03-15 NOTE — Telephone Encounter (Signed)
Patient is concerned about his BP before he has his Colonoscopy done and would like some advice.

## 2016-03-15 NOTE — Telephone Encounter (Signed)
returning call to helen.

## 2016-03-15 NOTE — Telephone Encounter (Signed)
Spoke w/ pt today, reassured him that gi will monitor his bp and review records for any issues w/ his bp, he is worried, gave him some advice to possibly help his bp, take all meds daily as prescribed, try to take at the same time daily, drink plenty of water, walk as health permits. He states he feels better after speaking with nurse. He will also relay his concerns to the gi doctor and staff

## 2016-03-17 NOTE — Telephone Encounter (Signed)
Agree. Thanks

## 2016-03-17 NOTE — Telephone Encounter (Signed)
I have spoken to pt

## 2016-03-24 ENCOUNTER — Telehealth: Payer: Self-pay | Admitting: Pulmonary Disease

## 2016-03-24 ENCOUNTER — Telehealth: Payer: Self-pay | Admitting: Internal Medicine

## 2016-03-24 ENCOUNTER — Ambulatory Visit (INDEPENDENT_AMBULATORY_CARE_PROVIDER_SITE_OTHER): Payer: BLUE CROSS/BLUE SHIELD | Admitting: Internal Medicine

## 2016-03-24 VITALS — BP 138/90 | HR 85 | Temp 98.2°F | Ht 67.0 in | Wt 205.8 lb

## 2016-03-24 DIAGNOSIS — N62 Hypertrophy of breast: Secondary | ICD-10-CM | POA: Insufficient documentation

## 2016-03-24 NOTE — Telephone Encounter (Signed)
Pt states retuning call to helen

## 2016-03-24 NOTE — Progress Notes (Signed)
   CC: chest pain  HPI:  Dylan Lane is a 66 y.o. with a PMH listed below presenting with gynecomastia.  Patient states that for the last two weeks, he has noticed his right breast to be swollen and mildly tender to palpation and when lying on side. He thought it might be from arm workouts he has been doing. Patient denies skin changes, nipple discharge, noticing masses, lymphadenopathy, weight loss, fevers, night sweats, or appetite change. He denies family history of breast cancer or previous similar problems.   Please see problem based Assessment and Plan for status of patients chronic conditions.  Past Medical History:  Diagnosis Date  . Colon polyps    3/06 and 2009:needs repeat in 3 yrs(3/12)  . GERD (gastroesophageal reflux disease) 2017  . Hyperlipidemia   . Hypertension   . Pre-diabetes    with fasting glucose of 110(2/09)  . Rectal bleeding    History of    Review of Systems:   Review of Systems  Constitutional: Negative for chills, fever and weight loss.  Cardiovascular: Negative for chest pain.  Gastrointestinal:       Reports good appetite  Musculoskeletal: Negative for back pain and myalgias.  Skin: Negative for rash.       Negative for redness  Neurological: Negative for weakness.    Physical Exam:  Vitals:   03/24/16 1558 03/24/16 1641  BP: (!) 141/87 138/90  Pulse: 94 85  Temp: 98.2 F (36.8 C)   TempSrc: Oral   SpO2: 97%   Weight: 205 lb 12.8 oz (93.4 kg)   Height: 5\' 7"  (1.702 m)    Physical Exam  Constitutional: He is oriented to person, place, and time. He appears well-developed and well-nourished. No distress.  HENT:  Head: Normocephalic and atraumatic.  Eyes: EOM are normal.  Neck: Normal range of motion. Neck supple.  Cardiovascular: Normal rate, regular rhythm, normal heart sounds and intact distal pulses.   Pulmonary/Chest: Effort normal and breath sounds normal. He has no wheezes. He has no rales.    Mild gynacomastia on right  compared to left; mild tenderness to palpation directly above nipple; denser tissue noted at this site without distinct mass. No erythema, rash, or nipple discharge. No axillary lymphadenopathy.  Abdominal: Soft. Bowel sounds are normal.  Musculoskeletal:  Upper strength and ROM intact and do not elicit pain in right breast  Lymphadenopathy:    He has no cervical adenopathy.  Neurological: He is alert and oriented to person, place, and time.  Skin: Skin is warm and dry. Capillary refill takes less than 2 seconds. No rash noted. He is not diaphoretic. No erythema.  Psychiatric: He has a normal mood and affect. His behavior is normal. Judgment and thought content normal.    Assessment & Plan:   See Encounters Tab for problem based charting.   Patient discussed with Dr. Nilsa Nutting, MD Internal Medicine PGY1

## 2016-03-24 NOTE — Patient Instructions (Addendum)
We are going to get imaging of your chest for your swelling. I will call you with the results.  It was nice meeting you today!

## 2016-03-24 NOTE — Telephone Encounter (Signed)
   Reason for call:   I received a call from [name] at 7  AM indicating below.   Pertinent Data:   Left part of chest feels a little swollen and 'feels like breast'.. Feels like this since the amlodipine was increased. He is wondering if this is side effect of medicine.  Denies nausea, vomiting, headaches, dyspnea or chest pain. Denies any muscle sprain.    Assessment / Plan / Recommendations:   Advised patient to come to the clinic to be evaluated. His medications are reviewed and unlikely to be causing his symptoms.   As always, pt is advised that if symptoms worsen or new symptoms arise, they should go to an urgent care facility or to to ER for further evaluation.   Burgess Estelle, MD   03/24/2016, 6:55 AM

## 2016-03-24 NOTE — Telephone Encounter (Signed)
Pt calls about his R chest area, the breast area is slightly swollen and tender, even notes this when asleep, not intense pain but tender enough to arouse him from sleep appt [facility] at 1545 today

## 2016-03-25 ENCOUNTER — Other Ambulatory Visit: Payer: Self-pay | Admitting: Internal Medicine

## 2016-03-25 DIAGNOSIS — N62 Hypertrophy of breast: Secondary | ICD-10-CM

## 2016-03-27 NOTE — Assessment & Plan Note (Addendum)
Patient states that for the last two weeks, he has noticed his right breast to be swollen and mildly tender to palpation and when lying on side. He thought it might be from arm workouts he has been doing. Patient denies skin changes, nipple discharge, noticing masses, lymphadenopathy, weight loss, fevers, night sweats, or appetite change. He denies family history of breast cancer or previous similar problems. He is on finasteride for BPH.   On exam, there is presence of denser tissue on right breast without a distinct mass that is tender to palpation; mild increase in size of right compared to left. No axillary lymphadenopathy, skin changes. Pain is not reproducible with RUE use.   Though finasteride can cause gynecomastia, there is concern due to unilateral nature and palpation of denser tissue compared to left breast so we will pursue imaging.  Plan: --diagnostic mammography with axillar U/S R

## 2016-03-28 ENCOUNTER — Other Ambulatory Visit: Payer: Self-pay | Admitting: *Deleted

## 2016-03-28 ENCOUNTER — Ambulatory Visit (INDEPENDENT_AMBULATORY_CARE_PROVIDER_SITE_OTHER): Payer: BLUE CROSS/BLUE SHIELD | Admitting: Internal Medicine

## 2016-03-28 ENCOUNTER — Telehealth: Payer: Self-pay | Admitting: Internal Medicine

## 2016-03-28 ENCOUNTER — Encounter (INDEPENDENT_AMBULATORY_CARE_PROVIDER_SITE_OTHER): Payer: Self-pay

## 2016-03-28 ENCOUNTER — Encounter: Payer: Self-pay | Admitting: Internal Medicine

## 2016-03-28 VITALS — BP 142/90 | HR 84 | Ht 67.0 in | Wt 204.0 lb

## 2016-03-28 DIAGNOSIS — I10 Essential (primary) hypertension: Secondary | ICD-10-CM

## 2016-03-28 DIAGNOSIS — R0789 Other chest pain: Secondary | ICD-10-CM | POA: Diagnosis not present

## 2016-03-28 DIAGNOSIS — N62 Hypertrophy of breast: Secondary | ICD-10-CM

## 2016-03-28 NOTE — Patient Instructions (Addendum)
Medication Instructions:  Try to limit NSAID use-ibuprofen/Aleve/Advil/Naproxen Instead you can use acetaminophen (Tylenol) for pain as directed on the bottle.  Labwork: none  Testing/Procedures: None   Follow-Up: Your physician wants you to follow-up in: 1 year with Dr End. (March 2019).  You will receive a reminder letter in the mail two months in advance. If you don't receive a letter, please call our office to schedule the follow-up appointment.   Any Other Special Instructions Will Be Listed Below (If Applicable).   DASH Eating Plan DASH stands for "Dietary Approaches to Stop Hypertension." The DASH eating plan is a healthy eating plan that has been shown to reduce high blood pressure (hypertension). It may also reduce your risk for type 2 diabetes, heart disease, and stroke. The DASH eating plan may also help with weight loss. What are tips for following this plan? General guidelines   Avoid eating more than 2,300 mg (milligrams) of salt (sodium) a day. If you have hypertension, you may need to reduce your sodium intake to 1,500 mg a day.  Limit alcohol intake to no more than 1 drink a day for nonpregnant women and 2 drinks a day for men. One drink equals 12 oz of beer, 5 oz of wine, or 1 oz of hard liquor.  Work with your health care provider to maintain a healthy body weight or to lose weight. Ask what an ideal weight is for you.  Get at least 30 minutes of exercise that causes your heart to beat faster (aerobic exercise) most days of the week. Activities may include walking, swimming, or biking.  Work with your health care provider or diet and nutrition specialist (dietitian) to adjust your eating plan to your individual calorie needs. Reading food labels   Check food labels for the amount of sodium per serving. Choose foods with less than 5 percent of the Daily Value of sodium. Generally, foods with less than 300 mg of sodium per serving fit into this eating plan.  To  find whole grains, look for the word "whole" as the first word in the ingredient list. Shopping   Buy products labeled as "low-sodium" or "no salt added."  Buy fresh foods. Avoid canned foods and premade or frozen meals. Cooking   Avoid adding salt when cooking. Use salt-free seasonings or herbs instead of table salt or sea salt. Check with your health care provider or pharmacist before using salt substitutes.  Do not fry foods. Cook foods using healthy methods such as baking, boiling, grilling, and broiling instead.  Cook with heart-healthy oils, such as olive, canola, soybean, or sunflower oil. Meal planning    Eat a balanced diet that includes:  5 or more servings of fruits and vegetables each day. At each meal, try to fill half of your plate with fruits and vegetables.  Up to 6-8 servings of whole grains each day.  Less than 6 oz of lean meat, poultry, or fish each day. A 3-oz serving of meat is about the same size as a deck of cards. One egg equals 1 oz.  2 servings of low-fat dairy each day.  A serving of nuts, seeds, or beans 5 times each week.  Heart-healthy fats. Healthy fats called Omega-3 fatty acids are found in foods such as flaxseeds and coldwater fish, like sardines, salmon, and mackerel.  Limit how much you eat of the following:  Canned or prepackaged foods.  Food that is high in trans fat, such as fried foods.  Food that is  high in saturated fat, such as fatty meat.  Sweets, desserts, sugary drinks, and other foods with added sugar.  Full-fat dairy products.  Do not salt foods before eating.  Try to eat at least 2 vegetarian meals each week.  Eat more home-cooked food and less restaurant, buffet, and fast food.  When eating at a restaurant, ask that your food be prepared with less salt or no salt, if possible. What foods are recommended? The items listed may not be a complete list. Talk with your dietitian about what dietary choices are best for  you. Grains  Whole-grain or whole-wheat bread. Whole-grain or whole-wheat pasta. Brown rice. [place] Morrow. Bulgur. Whole-grain and low-sodium cereals. Pita bread. Low-fat, low-sodium crackers. Whole-wheat flour tortillas. Vegetables  Fresh or frozen vegetables (raw, steamed, roasted, or grilled). Low-sodium or reduced-sodium tomato and vegetable juice. Low-sodium or reduced-sodium tomato sauce and tomato paste. Low-sodium or reduced-sodium canned vegetables. Fruits  All fresh, dried, or frozen fruit. Canned fruit in natural juice (without added sugar). Meat and other protein foods  Skinless chicken or [place]. Ground chicken or [place]. Pork with fat trimmed off. Fish and seafood. Egg whites. Dried beans, peas, or lentils. Unsalted nuts, nut butters, and seeds. Unsalted canned beans. Lean cuts of beef with fat trimmed off. Low-sodium, lean deli meat. Dairy  Low-fat (1%) or fat-free (skim) milk. Fat-free, low-fat, or reduced-fat cheeses. Nonfat, low-sodium ricotta or cottage cheese. Low-fat or nonfat yogurt. Low-fat, low-sodium cheese. Fats and oils  Soft margarine without trans fats. Vegetable oil. Low-fat, reduced-fat, or light mayonnaise and salad dressings (reduced-sodium). Canola, safflower, olive, soybean, and sunflower oils. Avocado. Seasoning and other foods  Herbs. Spices. Seasoning mixes without salt. Unsalted popcorn and pretzels. Fat-free sweets. What foods are not recommended? The items listed may not be a complete list. Talk with your dietitian about what dietary choices are best for you. Grains  Baked goods made with fat, such as croissants, muffins, or some breads. Dry pasta or rice meal packs. Vegetables  Creamed or fried vegetables. Vegetables in a cheese sauce. Regular canned vegetables (not low-sodium or reduced-sodium). Regular canned tomato sauce and paste (not low-sodium or reduced-sodium). Regular tomato and vegetable juice (not low-sodium or reduced-sodium). Angie Fava.  Olives. Fruits  Canned fruit in a light or heavy syrup. Fried fruit. Fruit in cream or butter sauce. Meat and other protein foods  Fatty cuts of meat. Ribs. Fried meat. Berniece Salines. Sausage. Bologna and other processed lunch meats. Salami. Fatback. Hotdogs. Bratwurst. Salted nuts and seeds. Canned beans with added salt. Canned or smoked fish. Whole eggs or egg yolks. Chicken or [place] with skin. Dairy  Whole or 2% milk, cream, and half-and-half. Whole or full-fat cream cheese. Whole-fat or sweetened yogurt. Full-fat cheese. Nondairy creamers. Whipped toppings. Processed cheese and cheese spreads. Fats and oils  Butter. Stick margarine. Lard. Shortening. Ghee. Bacon fat. Tropical oils, such as coconut, palm kernel, or palm oil. Seasoning and other foods  Salted popcorn and pretzels. Onion salt, garlic salt, seasoned salt, table salt, and sea salt. Worcestershire sauce. Tartar sauce. Barbecue sauce. Teriyaki sauce. Soy sauce, including reduced-sodium. Steak sauce. Canned and packaged gravies. Fish sauce. Oyster sauce. Cocktail sauce. Horseradish that you find on the shelf. Ketchup. Mustard. Meat flavorings and tenderizers. Bouillon cubes. Hot sauce and Tabasco sauce. Premade or packaged marinades. Premade or packaged taco seasonings. Relishes. Regular salad dressings. Where to find more information:  National Heart, Lung, and [place]: https://wilson-eaton.com/  American Heart Association: www.heart.org Summary  The DASH eating plan is a  healthy eating plan that has been shown to reduce high blood pressure (hypertension). It may also reduce your risk for type 2 diabetes, heart disease, and stroke.  With the DASH eating plan, you should limit salt (sodium) intake to 2,300 mg a day. If you have hypertension, you may need to reduce your sodium intake to 1,500 mg a day.  When on the DASH eating plan, aim to eat more fresh fruits and vegetables, whole grains, lean proteins, low-fat dairy, and heart-healthy  fats.  Work with your health care provider or diet and nutrition specialist (dietitian) to adjust your eating plan to your individual calorie needs. This information is not intended to replace advice given to you by your health care provider. Make sure you discuss any questions you have with your health care provider. Document Released: 12/09/2010 Document Revised: 12/14/2015 Document Reviewed: 12/14/2015 Elsevier Interactive Patient Education  2017 [employer].    If you need a refill on your cardiac medications before your next appointment, please call your pharmacy.

## 2016-03-28 NOTE — Addendum Note (Signed)
Addended by: Lalla Brothers T on: 03/28/2016 03:13 PM   Modules accepted: Level of Service

## 2016-03-28 NOTE — Progress Notes (Signed)
Internal Medicine Clinic Attending  Case discussed with Dr. Svalina  at the time of the visit.  We reviewed the resident's history and exam and pertinent patient test results.  I agree with the assessment, diagnosis, and plan of care documented in the resident's note.  

## 2016-03-28 NOTE — Progress Notes (Signed)
Follow-up Outpatient Visit Date: 03/28/2016  Primary Care Provider: Jacques Earthly, MD [place] [id number]  Chief Complaint: Chest pain  HPI:  Dylan Lane is a 66 y.o. year-old male with history of HTN, hyperlipidemia, impaired glucose tolerance, and GERD, who presents for follow-up of chest pain. He has not had any further episodes since our visit on 02/29/16. Subsequent ETT showed fair exercise tolerance without chest pain or concerning EKG findings, consistent with a low-risk study. Dylan Lane also denies shortness of breath, palpitations, lightheadedness, and edema. He has experienced some enlargement and tenderness of the right breast, which was attributed to finasteride. Symptoms have improved but not completely resolved since discontinuation of this medication. He is scheduled for a mammogram +/- ultrasound tomorrow. He has been using NSAIDs intermittently for pain relief. He has also noted sporadic swelling overlying the left elbow, for which he uses NSAIDs.  --------------------------------------------------------------------------------------------------  Cardiovascular History & Procedures: Cardiovascular Problems:  Chest pain  Risk Factors:  Hypertension, hyperlipidemia, male gender, and age > 5  Cath/PCI:  None  CV Surgery:  None  EP Procedures and Devices:  None  Non-Invasive Evaluation(s):  Exercise tolerance test (03/08/16): Low risk study without ST segment changes or arrhythmias. Hypertensive response to exercise. Fair exercise capacity (6 min, 16 seconds, 7.3 METS). Max HR 139 bpm (88% MPHR) without angina.  Recent CV Pertinent Labs: Lab Results  Component Value Date   CHOL 174 01/22/2015   HDL 42 01/22/2015   LDLCALC 114 (H) 01/22/2015   TRIG 92 01/22/2015   CHOLHDL 4.1 01/22/2015   CHOLHDL 4.1 05/14/2012   K 4.0 01/27/2016   BUN 11 01/27/2016   BUN 11 10/27/2015   CREATININE 1.22 01/27/2016   CREATININE 1.31 01/27/2014     Past medical and surgical history were reviewed and updated in EPIC.  Outpatient Encounter Prescriptions as of 03/28/2016  Medication Sig  . amLODipine (NORVASC) 10 MG tablet Take 1 tablet (10 mg total) by mouth daily.  Marland Kitchen aspirin 81 MG EC tablet Take 1 tablet (81 mg total) by mouth daily.  Marland Kitchen FLUARIX QUADRIVALENT 0.5 ML injection Inject 0.5 mLs into the muscle once.   . naproxen (NAPROSYN) 500 MG tablet Take 1 tablet (500 mg total) by mouth 2 (two) times daily with a meal.  . pantoprazole (PROTONIX) 40 MG tablet Take 1 tablet (40 mg total) by mouth daily.  . pravastatin (PRAVACHOL) 40 MG tablet Take 1 tablet (40 mg total) by mouth daily.  . [DISCONTINUED] finasteride (PROSCAR) 5 MG tablet TAKE ONE TABLET BY MOUTH ONCE DAILY   No facility-administered encounter medications on file as of 03/28/2016.     Allergies: Amoxicillin  Social History   Social History  . Marital status: Single    Spouse name: N/A  . Number of children: N/A  . Years of education: N/A   Occupational History  . Not on file.   Social History Main Topics  . Smoking status: Former Smoker    Types: Cigarettes    Quit date: 03/14/2000  . Smokeless tobacco: Never Used  . Alcohol use No  . Drug use: No  . Sexual activity: Not on file   Other Topics Concern  . Not on file   Social History Narrative   Lives with brother in [place]. Pt endorses a homosexual relationshup with HIV partner+. Pt aware of risks but says condoms are always used for intercourse.      Financial assistance approved for 100% discount at [facility] and has  [facility] card per Bonna Gains   11/16/2009             Family History  Problem Relation Age of Onset  . Dementia Mother   . Gout Father   . Diabetes Father   . Heart disease Father   . Colon cancer Neg Hx   . Rectal cancer Neg Hx   . Stomach cancer Neg Hx   . Heart attack Neg Hx     Review of Systems: A 12-system review of systems was performed and was negative except as  noted in the HPI.  --------------------------------------------------------------------------------------------------  Physical Exam: BP (!) 142/90   Pulse 84   Ht 5\' 7"  (1.702 m)   Wt 204 lb (92.5 kg)   BMI 31.95 kg/m   General:  Overweight man, seated comfortably in the exam room. HEENT: No conjunctival pallor or scleral icterus.  Moist mucous membranes.  OP clear. Neck: Supple without lymphadenopathy, thyromegaly, JVD, or HJR. Lungs: Normal work of breathing.  Clear to auscultation bilaterally without wheezes or crackles. Heart: Regular rate and rhythm without murmurs, rubs, or gallops.  Non-displaced PMI. Abd: Bowel sounds present.  Soft, NT/ND without hepatosplenomegaly Ext: No lower extremity edema.  Radial, PT, and DP pulses are 2+ bilaterally. Skin: warm and dry without rash. Breast: Minimally asymmetry of the breasts, right larger than left. No focal mass. Minimal areolar tenderness on the right without discharge.   Lab Results  Component Value Date   WBC 5.7 01/27/2016   HGB 15.1 01/27/2016   HCT 43.0 01/27/2016   MCV 81.6 01/27/2016   PLT 214 01/27/2016    Lab Results  Component Value Date   NA 140 01/27/2016   K 4.0 01/27/2016   CL 105 01/27/2016   CO2 27 01/27/2016   BUN 11 01/27/2016   CREATININE 1.22 01/27/2016   GLUCOSE 107 (H) 01/27/2016   ALT 22 12/06/2015    Lab Results  Component Value Date   CHOL 174 01/22/2015   HDL 42 01/22/2015   LDLCALC 114 (H) 01/22/2015   TRIG 92 01/22/2015   CHOLHDL 4.1 01/22/2015    --------------------------------------------------------------------------------------------------  ASSESSMENT AND PLAN: Chest pain No recurrence since our last visit. ETT was reassuring without chest pain or ischemic EKG changes. Patient to continue with primary prevention. No further work-up at this time.  Hypertension Blood pressure suboptimally controlled. Patient reports being compliant with amlodipine. I have encouraged him to  minimize NSAID use and to make lifestyle modifications, including sodium restriction, exercise, and weight loss. If his blood pressure remains elevated, an additional antihypertensive agent should be considered. I will defer this to Dylan Lane's PCP, whom the patient will be seeing in 06/2016.  Gynecomastia Slight asymmetric enlargement involving the right breast. Mammogram and ultrasound ordered by Dr. Jari Favre are scheduled for tomorrow. Will defer further workup to internal medicine clinic.  Follow-up: Return to clinic in 1 year.  Nelva Bush, MD 03/28/2016 2:01 PM

## 2016-03-28 NOTE — Telephone Encounter (Signed)
Pt asking if Dr End would be his primary care doctor, pt advised Dr End specializes in cardiology and would not be able to be his primary care doctor.  Pt was disappointed  because he was pleased with his care by Dr End.

## 2016-03-28 NOTE — Telephone Encounter (Signed)
New Message:     Please call, question about his medicine 

## 2016-03-30 ENCOUNTER — Ambulatory Visit
Admission: RE | Admit: 2016-03-30 | Discharge: 2016-03-30 | Disposition: A | Discharge status: Home / Self Care | Payer: BLUE CROSS/BLUE SHIELD | Source: Ambulatory Visit | Attending: Internal Medicine | Admitting: Internal Medicine

## 2016-03-30 DIAGNOSIS — N62 Hypertrophy of breast: Secondary | ICD-10-CM

## 2016-04-04 ENCOUNTER — Telehealth: Payer: Self-pay

## 2016-04-04 ENCOUNTER — Ambulatory Visit: Payer: BLUE CROSS/BLUE SHIELD

## 2016-04-04 NOTE — Telephone Encounter (Signed)
Agree with [facility] appt. Provider can consider aspiration and injection of steroid if this is recurrent olecranon bursitis. Thanks!

## 2016-04-04 NOTE — Telephone Encounter (Signed)
Patient called states he has a non painful fluid filled pocket on his left elbow reports it has been there 2 weeks he is concerned that something is wrong . Advised patient to come for appointment will come at 345 pm

## 2016-04-06 ENCOUNTER — Encounter: Payer: Self-pay | Admitting: Internal Medicine

## 2016-04-06 ENCOUNTER — Ambulatory Visit (INDEPENDENT_AMBULATORY_CARE_PROVIDER_SITE_OTHER): Payer: Self-pay | Admitting: Internal Medicine

## 2016-04-06 VITALS — BP 136/80 | HR 96 | Ht 68.4 in | Wt 204.4 lb

## 2016-04-06 DIAGNOSIS — M7022 Olecranon bursitis, left elbow: Secondary | ICD-10-CM

## 2016-04-06 MED ORDER — NAPROXEN 500 MG PO TABS: 500.0000 mg | ORAL_TABLET | Freq: Two times a day (BID) | ORAL | 0 refills | Status: DC

## 2016-04-06 NOTE — Progress Notes (Signed)
   CC: Left elbow swelling  HPI:  [name] is a 66 y.o. man here today for persistent swelling lasting several weeks over the left elbow.   See problem based assessment and plan below for additional details  Past Medical History:  Diagnosis Date  . Colon polyps    3/06 and 2009:needs repeat in 3 yrs(3/12)  . GERD (gastroesophageal reflux disease) 2017  . Hyperlipidemia   . Hypertension   . Pre-diabetes    with fasting glucose of 110(2/09)  . Rectal bleeding    History of    Review of Systems:  Review of Systems  Constitutional: Negative for fever.  Cardiovascular: Negative for leg swelling.  Musculoskeletal: Negative for joint pain.  Skin: Negative for rash.  Neurological: Negative for sensory change.    Physical Exam: Physical Exam  Constitutional: He is well-developed, well-nourished, and in no distress. No distress.  Cardiovascular: Normal rate and regular rhythm.   Musculoskeletal: Normal range of motion. He exhibits no edema.  Approximately 1 inch diameter fluid collection over the left olecranon without any tenderness, erythema, or warmth  Skin: Skin is warm and dry. No rash noted.    Vitals:   04/06/16 1434  BP: 136/80  Pulse: 96  SpO2: 100%  Weight: 204 lb 6.4 oz (92.7 kg)  Height: 5' 8.4" (1.737 m)    Assessment & Plan:   See Encounters Tab for problem based charting.  Patient discussed with Dr. Dareen Piano

## 2016-04-06 NOTE — Patient Instructions (Signed)
It was a pleasure to see you today [name].  You have some inflammation and swelling of the pad overlying your left elbow. This is known as olecrenon bursitis. The most common cause is due to small injuries from bumping your elbow or resting it against hard surfaces. This often improves with conservative treatment with an antiinflammatory medicine and wearing a soft elbow sleeve or pad. This should be available at most commercial pharmacies such as [facility] without a prescription.  If your elbow does become more painful or swollen we could treat it more immediately with a needle drainage of this fluid collection. Please call back to the clinic if you decide this so they can schedule you while we have a supervising physician present who can do this.

## 2016-04-08 NOTE — Assessment & Plan Note (Signed)
HPI: He has persistent nonpainful left elbow swelling that has now been around for greater than a month. He does not recall any injury or, to this elbow and has never had problems with the joint in the past. 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 He tried taking some anti-inflammatory medicines in February but didn't notice a large improvement in the elbow swelling. He has never had gout or other types of arthritis and has never noticed any other joints become swollen. He denies any fevers or that there is been any redness over the elbow.  A: He has continued mild olecranon bursitis in the left elbow which was previously recorded by Dr. Randell Patient in February of this year. He really did take some NSAIDs for short-term without resolution of the symptoms. This should be a candidate for trying ongoing conservative therapy versus aspiration and steroid injection to resolve the bursitis.   P: On discussion with Dylan Lane today he seemed inclined to still want to try a conservative approach unless that has no chance to work. Therefore I recommended starting to wear a soft elbow sleeve/pad and take naproxen for the next 2 weeks and see if there is improvement in swelling. He also needs to limit leaning on this elbow as much as possible.  If his symptoms were to get any worse, does not get better and still bothers him we can arrange to see him again in clinic for joint aspiration and injection.

## 2016-04-12 NOTE — Progress Notes (Signed)
Internal Medicine Clinic Attending  Case discussed with Dr. Rice at the time of the visit.  We reviewed the resident's history and exam and pertinent patient test results.  I agree with the assessment, diagnosis, and plan of care documented in the resident's note.  

## 2016-04-15 ENCOUNTER — Telehealth: Payer: Self-pay | Admitting: Pulmonary Disease

## 2016-04-15 MED ORDER — IBUPROFEN 600 MG PO TABS: 600.0000 mg | ORAL_TABLET | Freq: Four times a day (QID) | ORAL | 0 refills | Status: DC | PRN

## 2016-04-15 NOTE — Telephone Encounter (Signed)
Talked to pt - stated he prefers taking Ibuprofen. Instructed not to take Ibu and Naproxen at the same time; voiced understanding. Informed Ibu rx has been sent to [facility].

## 2016-04-15 NOTE — Telephone Encounter (Signed)
Stated after he finished taking Naproxen - he noticed some swelling/tenderness of right breast which was not there prior. Wants to know if he needs to continue Naproxen or go back on Ibuprofen? Thanks

## 2016-04-15 NOTE — Telephone Encounter (Addendum)
If he was tolerating the ibuprofen without increased swelling that is a perfectly acceptable alternative treatment. He does need to stop the naproxen though if resuming ibuprofen and avoid taking both. I was unable to get a hold of him by phone on my first attempt this afternoon. I will update his medication list and preference accordingly. Can someone try to call him back again with this recommendation?

## 2016-04-15 NOTE — Telephone Encounter (Signed)
Pt need to speak to nurse °

## 2016-04-26 ENCOUNTER — Telehealth: Payer: Self-pay | Admitting: Pulmonary Disease

## 2016-04-26 NOTE — Telephone Encounter (Signed)
Pt would like to know what to take for his headache.

## 2016-04-27 NOTE — Telephone Encounter (Signed)
Talked to patient on phone. He says his headache is a little better after taking Extra Strength Tylenol. I told him he can continue taking Tylenol per the box instructions and can also take OTC ibuprofen per box instructions. He is not taking prescription ibuprofen at the same time. He wants to follow up for his elbow. Could you call him and see when he wants to come in - can be seen in [facility] probably preferably when Dr. Evette Doffing is attending.

## 2016-05-11 ENCOUNTER — Ambulatory Visit: Payer: BLUE CROSS/BLUE SHIELD

## 2016-05-11 ENCOUNTER — Telehealth: Payer: Self-pay | Admitting: Internal Medicine

## 2016-05-11 NOTE — Telephone Encounter (Signed)
   Reason for call:   I received a call from Mr. Harvir Patry at 907 and again at 939 PM indicating he had muscle spasms over his breastbone.   Pertinent Data:   His spasms started around 4-5pm when he picked up a heavy flower pot.  Since then, his spasms are Intermittent and do not last more than minutes at a time.  Spasms improve when he palpates them though nothing worsens them.  He has not tried any medications and acknowledges taking daily aspirin 81 mg.  He denies sweating, tinging, trouble breathing, dizziness.   Per review of the chart, he had low-risk stress test in March 2018.  He also asked about sleeping medications.   Assessment / Plan / Recommendations:   I reassured him that his pain was likely musculoskeletal strain in the setting of exertion. I recommended he try either ibuprofen or acetaminophen. If no better, he would need to call back in the morning for anything stronger.  I recommended he schedule an appointment to discuss sleeping agents as it required a careful assessment and evaluation.  As always, pt is advised that if symptoms worsen or new symptoms arise, they should go to an urgent care facility or to to ER for further evaluation.   Riccardo Dubin, MD   05/11/2016, 10:16 PM

## 2016-05-16 ENCOUNTER — Ambulatory Visit: Payer: BLUE CROSS/BLUE SHIELD

## 2016-05-17 ENCOUNTER — Ambulatory Visit: Payer: BLUE CROSS/BLUE SHIELD

## 2016-05-17 ENCOUNTER — Encounter: Payer: Self-pay | Admitting: Pulmonary Disease

## 2016-06-09 ENCOUNTER — Encounter: Payer: BLUE CROSS/BLUE SHIELD | Admitting: Pulmonary Disease

## 2016-06-16 IMAGING — CT CT HEAD W/O CM
2 series · 16 of 30 positions shown, 20 images · non-contrast
Comparison: 04/16/2014

CLINICAL DATA: Headache and dizziness since yesterday.

EXAM:
CT HEAD WITHOUT CONTRAST
TECHNIQUE: Contiguous axial images were obtained from the base of the skull
through the vertex without intravenous contrast.

[Series 201: head w/o, idose (1) · axial · non-contrast · 0.43mm/px · z∈[+77,+197]mm · 13 of 29 slices shown, 17 images]
[im 3/29  brain]
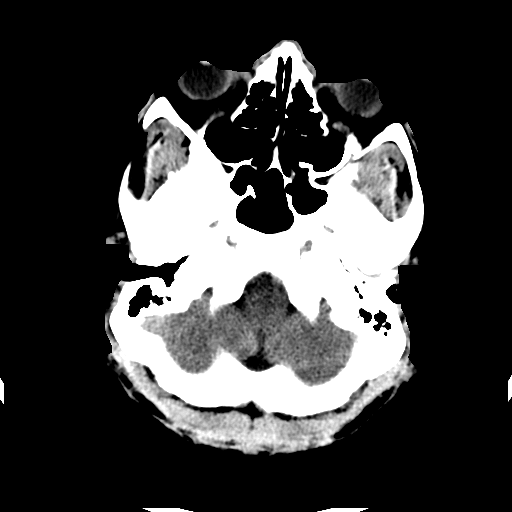
[im 3/29  bone]
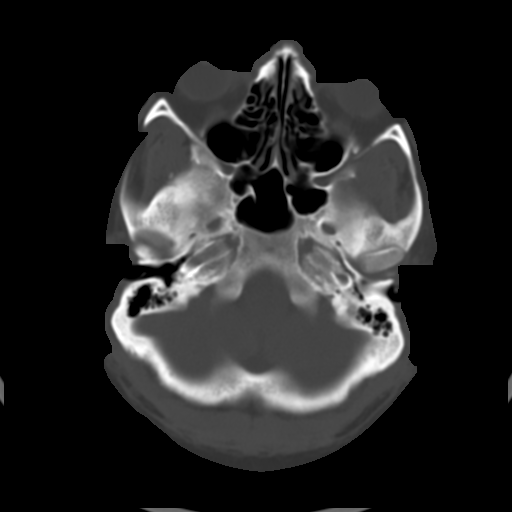
[im 5/29  brain]
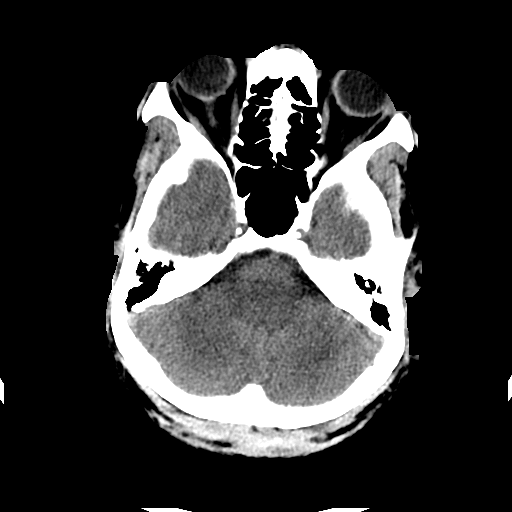
[im 7/29  brain]
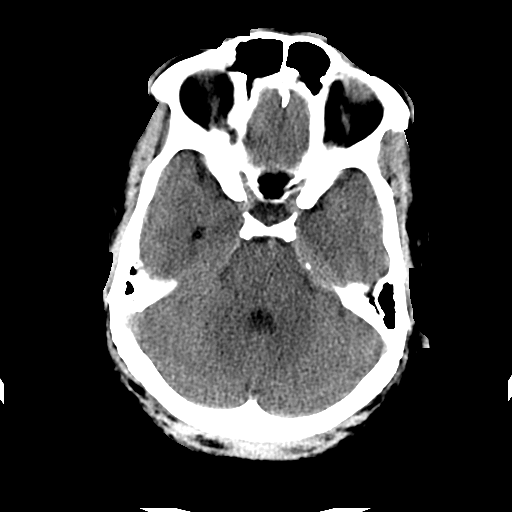
[im 9/29  brain]
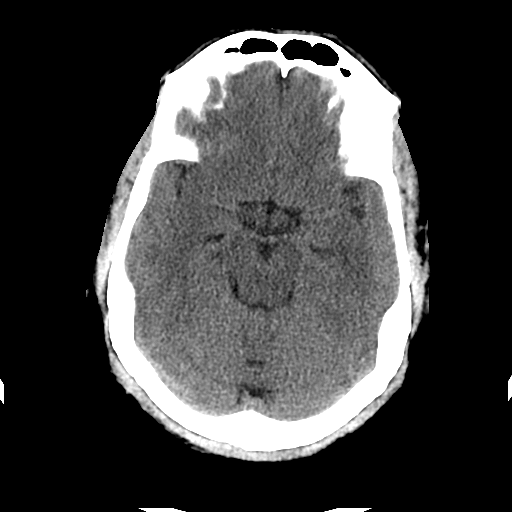
[im 11/29  brain]
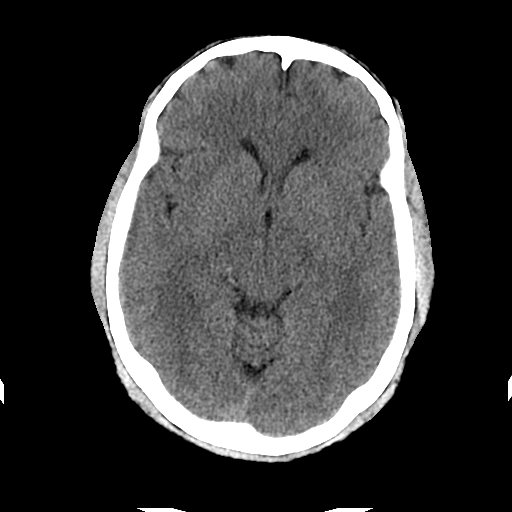
[im 11/29  bone]
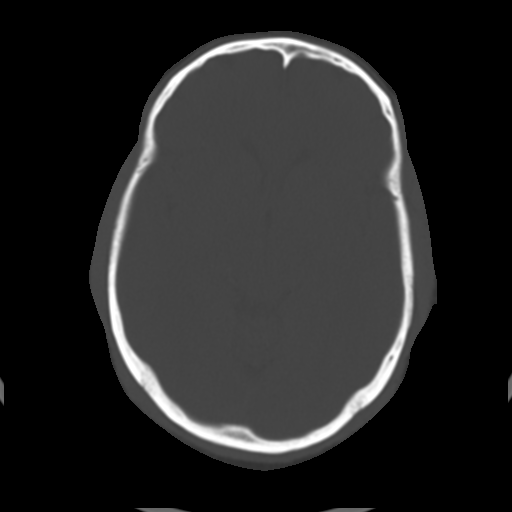
[im 13/29  brain]
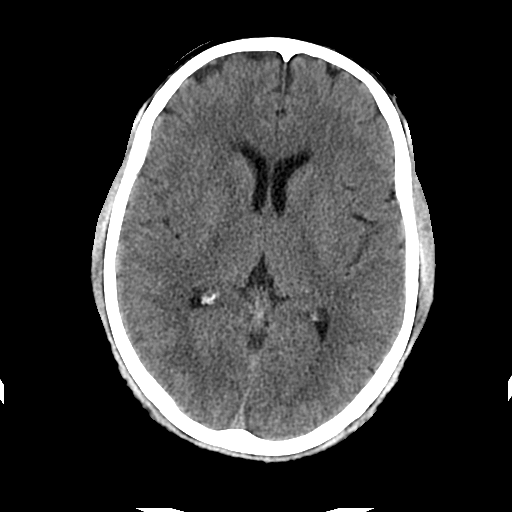
[im 15/29  brain]
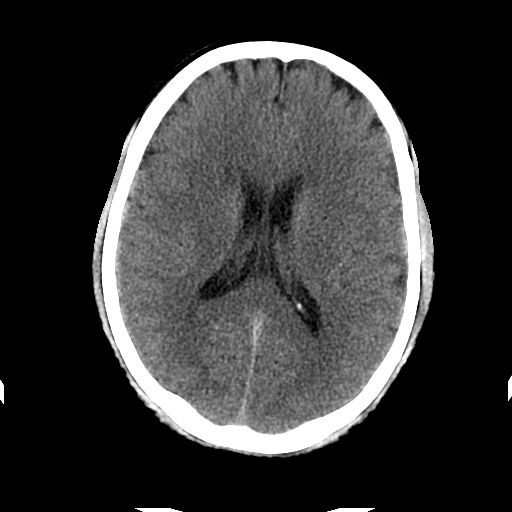
[im 17/29  brain]
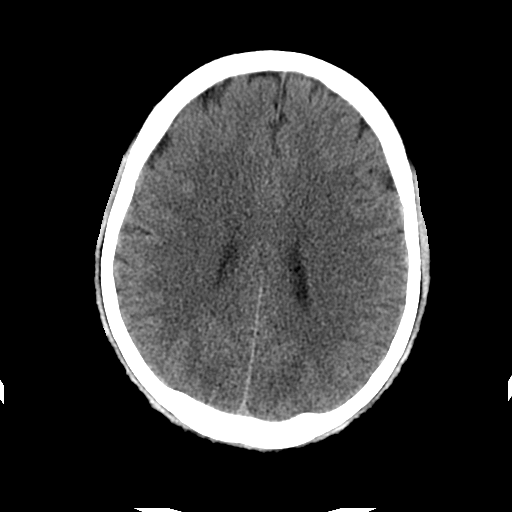
[im 19/29  brain]
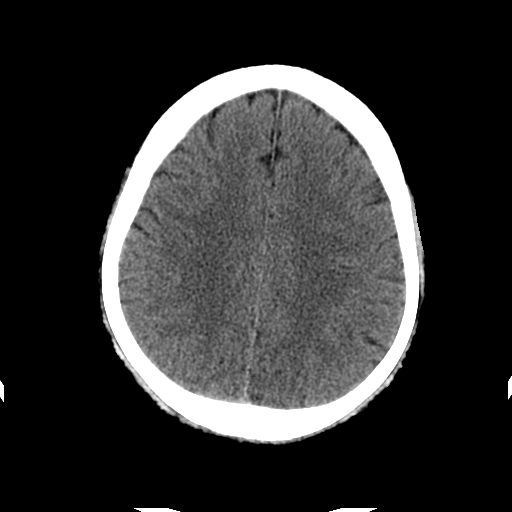
[im 19/29  bone]
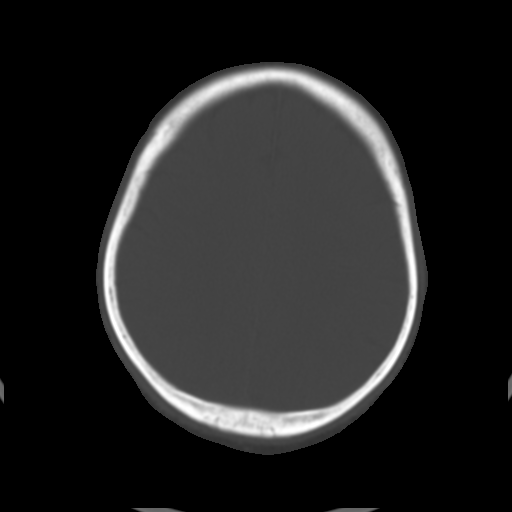
[im 21/29  brain]
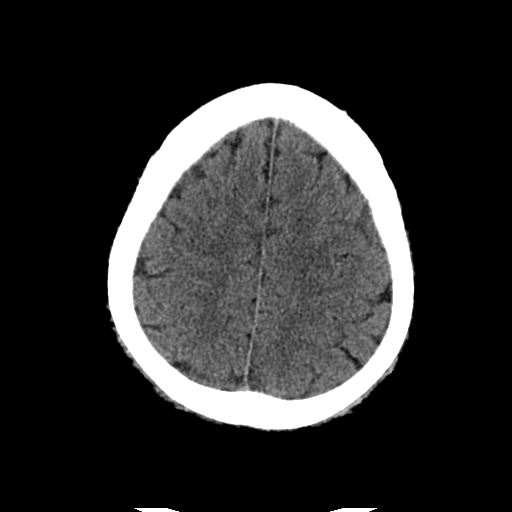
[im 23/29  brain]
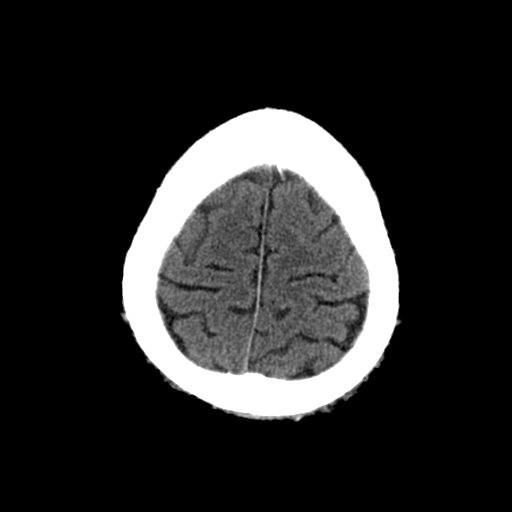
[im 25/29  brain]
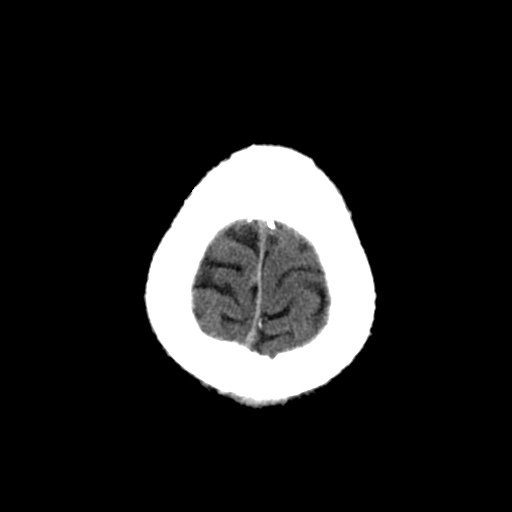
[im 27/29  brain]
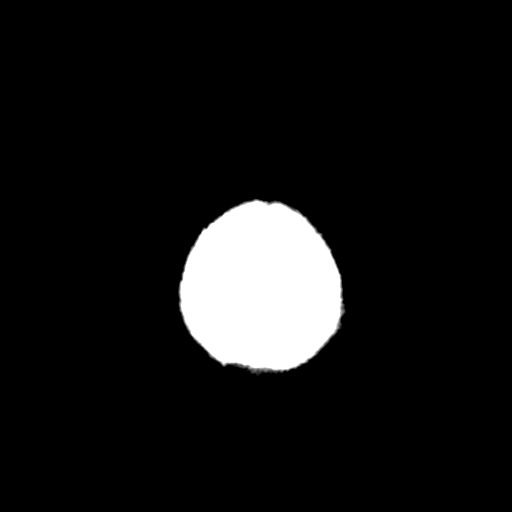
[im 27/29  bone]
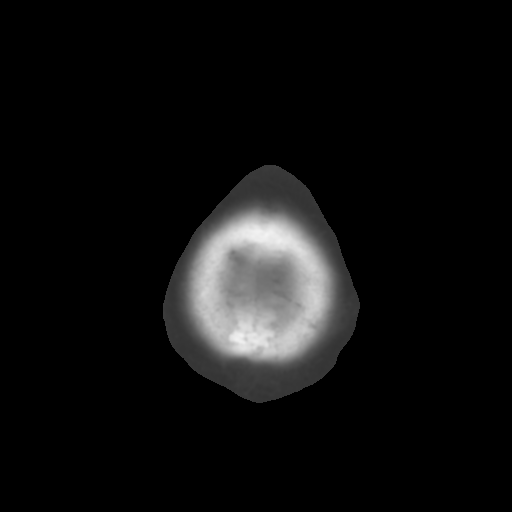

[Series 202: head w/o bone, idose (1) · axial · non-contrast · 0.43mm/px · z∈[+77,+117]mm · 3 of 29 slices shown]
[im 3/29  bone]
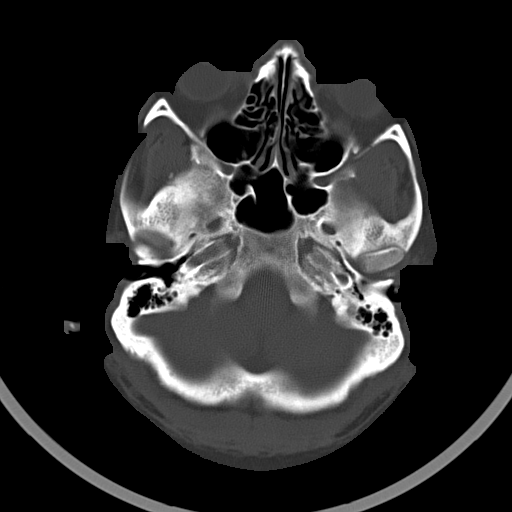
[im 7/29  bone]
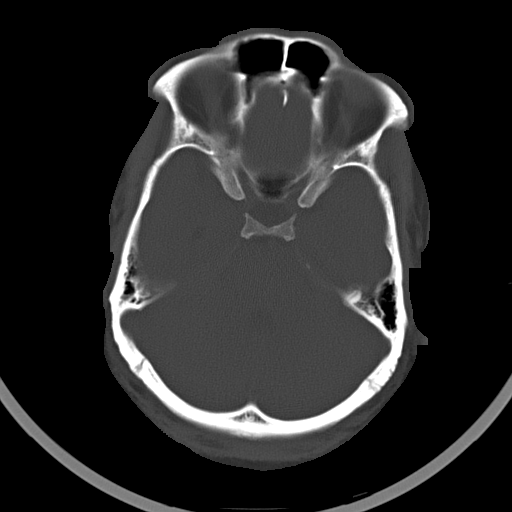
[im 11/29  bone]
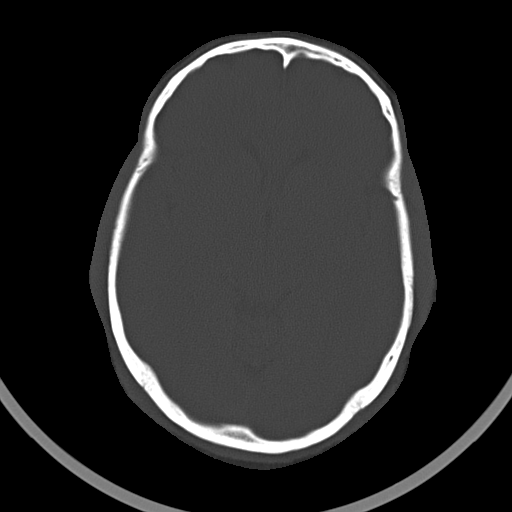

[16 of 30 positions shown; findings below may reference images not displayed]

FINDINGS: There is no evidence of acute cortical infarct, intracranial
hemorrhage, mass, midline shift, or extra-axial fluid collection.
Ventricles and sulci are normal in size for age. Minimal
periventricular white matter hypoattenuation is nonspecific but
compatible with minimal chronic small vessel ischemic disease.

Orbits are unremarkable. The visualized paranasal sinuses and
mastoid air cells are clear. No skull fracture is seen. Mild
calcified atherosclerosis is noted at the skullbase.
IMPRESSION: No evidence of acute intracranial abnormality.

## 2016-06-17 ENCOUNTER — Encounter: Payer: Self-pay | Admitting: Pulmonary Disease

## 2016-06-20 ENCOUNTER — Encounter: Payer: Self-pay | Admitting: *Deleted

## 2016-06-20 ENCOUNTER — Telehealth: Payer: Self-pay | Admitting: Internal Medicine

## 2016-06-20 ENCOUNTER — Telehealth: Payer: Self-pay

## 2016-06-20 NOTE — Telephone Encounter (Signed)
Would like Dylan Lane to call back.  

## 2016-06-20 NOTE — Telephone Encounter (Signed)
   Reason for call:   I received a call from [name] at 9:22 PM indicating he has has worsening right breast swelling over the past 3 months.   Pertinent Data:   3 month history of right sided gynecomastia  He was seen in clinic on 03/24/2016 with similar complaints. He was sent for diagnostic mammogram with axillary [place] at that time which was consistent with benign gynecomastia.  Concerned that right right breast has continued to grow over the past several months  Extremely tender to palpation   Denies any nipple discharge, skin changes, lumps, weight loss   Assessment / Plan / Recommendations:   Recommended scheduling follow up in clinic for further evaluation and work up; reports he will call and schedule an appointment in the AM  As always, pt is advised that if symptoms worsen or new symptoms arise, they should go to an urgent care facility or to to ER for further evaluation.   Maryellen Pile, MD   06/20/2016, 9:24 PM

## 2016-06-21 ENCOUNTER — Ambulatory Visit (INDEPENDENT_AMBULATORY_CARE_PROVIDER_SITE_OTHER): Payer: Self-pay | Admitting: Internal Medicine

## 2016-06-21 ENCOUNTER — Encounter: Payer: Self-pay | Admitting: Internal Medicine

## 2016-06-21 VITALS — BP 137/92 | HR 97 | Temp 97.8°F | Ht 67.0 in | Wt 206.7 lb

## 2016-06-21 DIAGNOSIS — Z79899 Other long term (current) drug therapy: Secondary | ICD-10-CM

## 2016-06-21 DIAGNOSIS — N400 Enlarged prostate without lower urinary tract symptoms: Secondary | ICD-10-CM

## 2016-06-21 DIAGNOSIS — I10 Essential (primary) hypertension: Secondary | ICD-10-CM

## 2016-06-21 DIAGNOSIS — Z87891 Personal history of nicotine dependence: Secondary | ICD-10-CM

## 2016-06-21 DIAGNOSIS — K219 Gastro-esophageal reflux disease without esophagitis: Secondary | ICD-10-CM

## 2016-06-21 DIAGNOSIS — R0789 Other chest pain: Secondary | ICD-10-CM

## 2016-06-21 DIAGNOSIS — N62 Hypertrophy of breast: Secondary | ICD-10-CM

## 2016-06-21 MED ORDER — AMLODIPINE BESYLATE 10 MG PO TABS: 10.0000 mg | ORAL_TABLET | Freq: Every day | ORAL | 1 refills | Status: DC

## 2016-06-21 NOTE — Assessment & Plan Note (Signed)
According to patient he does not want to take finasteride on a regular basis at it cause decreased in his libido.  He takes it as a when necessary basis when his urinary symptoms started getting worse.  -He can continue to take it when necessary basis, unless his urinary symptoms are worser. We can try Cialis in the future, as change in his libido was the main factor for his noncompliance with BPH medication.

## 2016-06-21 NOTE — Assessment & Plan Note (Signed)
He was also complaining of occasionally worsening of his GERD symptoms, for which he takes his Protonix twice daily for a few days. That seems helping.   -He can continue current management.

## 2016-06-21 NOTE — Assessment & Plan Note (Signed)
BP Readings from Last 3 Encounters:  06/21/16 (!) 137/92  04/06/16 136/80  03/28/16 (!) 142/90   Patient never took his morning meds today.  -Continue current management with amlodipine 10 mg daily.

## 2016-06-21 NOTE — Telephone Encounter (Signed)
Seeing md today

## 2016-06-21 NOTE — Progress Notes (Signed)
   CC: Right breast tenderness to touch.  HPI:  Mr.Jaloni Schmieg is a 66 y.o. with past medical history as listed below came to the clinic with complaint of right breast tenderness to touch. Denies any pain. He has couple of previous office visits cause of the same complaint, also complained of breast enlargement right more than left. He was sent for mammography which was normal.  According to patient his right breast is very sensitive to any touch or sleeping on right side. He denies any pain. He denies any aggravation of his symptoms with arm movement, denies any fever, chills, chest pain or night sweats. Pain has no pleuritic component.  He was also complaining of occasionally worsening of his GERD symptoms, for which he takes his Protonix twice daily for a few days. That seems helping.   Past Medical History:  Diagnosis Date  . Colon polyps    3/06 and 2009:needs repeat in 3 yrs(3/12)  . GERD (gastroesophageal reflux disease) 2017  . Hyperlipidemia   . Hypertension   . Pre-diabetes    with fasting glucose of 110(2/09)  . Rectal bleeding    History of    Review of Systems:  AS per HPI.  Physical Exam:  Vitals:   06/21/16 1417  BP: (!) 137/92  Pulse: 97  Temp: 97.8 F (36.6 C)  TempSrc: Oral  SpO2: 97%  Weight: 206 lb 11.2 oz (93.8 kg)  Height: 5\' 7"  (1.702 m)    General: Vital signs reviewed.  Patient is well-developed and well-nourished, in no acute distress and cooperative with exam.  Breast. Mild gynecomastia more on right as compared to left, mild tenderness to palpation at right nipple no nipple discharge,No axillary lymphadenopathy. Cardiovascular: RRR, S1 normal, S2 normal, no murmurs, gallops, or rubs. Pulmonary/Chest: Clear to auscultation bilaterally, no wheezes, rales, or rhonchi. Abdominal: Soft, non-tender, non-distended, BS +, no masses, organomegaly, or guarding present.  Extremities: No lower extremity edema bilaterally,  pulses symmetric and intact  bilaterally. No cyanosis or clubbing. Skin: Warm, dry and intact. No rashes or erythema. Psychiatric: Normal mood and affect. speech and behavior is normal. Cognition and memory are normal.  Assessment & Plan:   See Encounters Tab for problem based charting.  Patient discussed with Dr. Lynnae January.

## 2016-06-21 NOTE — Assessment & Plan Note (Addendum)
Patient came to the clinic with complaint of right breast tenderness to touch. Denies any pain. He has couple of previous office visits cause of the same complaint, also complained of breast enlargement right more than left. He was sent for mammography which was normal.  According to patient his right breast is very sensitive to any touch or sleeping on right side. He denies any pain. He denies any aggravation of his symptoms with arm movement, denies any fever, chills, chest pain or night sweats. Pain has no pleuritic component.  On exam.Mild gynecomastia more on right as compared to left, mild tenderness to palpation at right nipple no nipple discharge,No axillary lymphadenopathy.  -As his mammogram is normal, we will try to evaluate by checking his testosterone level, beta-hCG, LH, TSH and prolactin levels. Most of the time these cause bilateral gynecomastia. Finasteride can also cause gynecomastia mostly bilateral. -Patient will come on Thursday morning for lab workup. -He was advised to follow-up in 2 weeks for results.  Addendum. He is having decreased total testosterone and increase in LH, his prolactin level and TSH and beta-hCG are within normal limit. 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 I called the patient and we will repeat testosterone total and free along with LH and FSH. Once those results are available we will discuss with patient about risk and benefits of testosterone replacement therapy. Patient seems agreeable and will come tomorrow for lab drawn.

## 2016-06-21 NOTE — Patient Instructions (Addendum)
Thank you for visiting clinic today. As we discussed that we need to do some blood workup, and part of that has to be done early morning, please make an appointment for early morning lab workup. We will see you back in clinic in about 2 weeks to discuss the results.

## 2016-06-22 NOTE — Progress Notes (Signed)
Internal Medicine Clinic Attending  Case discussed with Dr. Amin at the time of the visit.  We reviewed the resident's history and exam and pertinent patient test results.  I agree with the assessment, diagnosis, and plan of care documented in the resident's note.    

## 2016-06-23 ENCOUNTER — Other Ambulatory Visit: Payer: Self-pay

## 2016-06-28 ENCOUNTER — Other Ambulatory Visit (INDEPENDENT_AMBULATORY_CARE_PROVIDER_SITE_OTHER): Payer: Self-pay

## 2016-06-28 DIAGNOSIS — N62 Hypertrophy of breast: Secondary | ICD-10-CM

## 2016-06-29 LAB — LUTEINIZING HORMONE: LH: 9.4 m[IU]/mL — ABNORMAL HIGH (ref 1.7–8.6)

## 2016-06-29 LAB — TSH: TSH: 2.32 u[IU]/mL (ref 0.450–4.500)

## 2016-06-29 LAB — BETA HCG QUANT (REF LAB): hCG Quant: <1 m[IU]/mL (ref 0–3)

## 2016-06-29 LAB — TESTOSTERONE: Testosterone: 257 ng/dL — ABNORMAL LOW (ref 264–916)

## 2016-06-29 LAB — PROLACTIN: Prolactin: 8.3 ng/mL (ref 4.0–15.2)

## 2016-06-29 NOTE — Addendum Note (Signed)
Addended by: Lorella Nimrod on: 06/29/2016 10:14 AM   Modules accepted: Orders

## 2016-07-05 ENCOUNTER — Ambulatory Visit: Payer: Self-pay

## 2016-07-18 ENCOUNTER — Ambulatory Visit: Payer: Self-pay | Admitting: Internal Medicine

## 2016-07-18 DIAGNOSIS — N62 Hypertrophy of breast: Secondary | ICD-10-CM

## 2016-07-18 NOTE — Progress Notes (Signed)
This is a lab only visit, the patient will follow up in 2 weeks to review the results. It is documented as no charge for physician level of service.

## 2016-07-18 NOTE — Patient Instructions (Signed)
Lab only 

## 2016-07-20 ENCOUNTER — Telehealth: Payer: Self-pay

## 2016-07-20 ENCOUNTER — Telehealth: Payer: Self-pay | Admitting: Internal Medicine

## 2016-07-20 NOTE — Telephone Encounter (Signed)
Requesting lab results. Please call pt back.  

## 2016-07-20 NOTE — Telephone Encounter (Signed)
WANTS LAB RESULTS °

## 2016-07-20 NOTE — Telephone Encounter (Signed)
Can you please make an office appointment, we will need a discussion regarding testosterone replacement therapy.

## 2016-07-22 LAB — FOLLICLE STIMULATING HORMONE: FSH: 9.6 m[IU]/mL (ref 1.5–12.4)

## 2016-07-22 LAB — TESTOSTERONE,FREE AND TOTAL
Testosterone, Free: 3.5 pg/mL — ABNORMAL LOW (ref 6.6–18.1)
Testosterone: 245 ng/dL — ABNORMAL LOW (ref 264–916)

## 2016-07-22 LAB — LUTEINIZING HORMONE: LH: 6.7 m[IU]/mL (ref 1.7–8.6)

## 2016-07-25 ENCOUNTER — Ambulatory Visit: Payer: Self-pay

## 2016-08-01 ENCOUNTER — Ambulatory Visit (INDEPENDENT_AMBULATORY_CARE_PROVIDER_SITE_OTHER): Payer: Self-pay | Admitting: Internal Medicine

## 2016-08-01 VITALS — BP 134/75 | HR 74 | Temp 98.3°F | Wt 206.3 lb

## 2016-08-01 DIAGNOSIS — N62 Hypertrophy of breast: Secondary | ICD-10-CM

## 2016-08-01 NOTE — Patient Instructions (Signed)
[name],  Please follow up in the morning at 8:15 AM for lab work

## 2016-08-01 NOTE — Progress Notes (Signed)
   CC: follow up on lab work for hypogonadism   HPI:  [name] is a 65 y.o. male with history noted below that presents to the acute care clinic for follow up on lab work for hypogonadism.  Please see problem based charting for the status of patient's chronic medical conditions.  Past Medical History:  Diagnosis Date  . Colon polyps    3/06 and 2009:needs repeat in 3 yrs(3/12)  . GERD (gastroesophageal reflux disease) 2017  . Hyperlipidemia   . Hypertension   . Pre-diabetes    with fasting glucose of 110(2/09)  . Rectal bleeding    History of    Review of Systems:  Review of Systems  Constitutional: Negative for chills and fever.  Eyes: Negative for blurred vision.  Respiratory: Negative for shortness of breath.   Cardiovascular: Negative for chest pain.     Physical Exam:  Vitals:   08/01/16 0918  BP: 134/75  Pulse: 74  Temp: 98.3 F (36.8 C)  TempSrc: Oral  SpO2: 98%  Weight: 206 lb 4.8 oz (93.6 kg)   Physical Exam  Cardiovascular: Normal rate, regular rhythm and normal heart sounds.  Exam reveals no gallop and no friction rub.   No murmur heard. Pulmonary/Chest: Effort normal and breath sounds normal. No respiratory distress. He has no wheezes. He has no rales.  Musculoskeletal:  Mild Right breast swelling, no erythema, no masses noted, no nipple discharge  Tenderness to palpation of right breast    Assessment & Plan:   See encounters tab for problem based medical decision making.    Patient discussed with Dr. Lynnae January

## 2016-08-03 NOTE — Assessment & Plan Note (Addendum)
HPI:  Patient states he is here to discuss the lab results he had on 7/16.  He states that he continues to have right breast tenderness and swelling since march 2018.  He states that he stopped taking finasteride and only takes amlodipine, pantoprazole and aspirin.  He denies trauma to the area.  He states that he feels the swelling has worsened slightly to the right breast.     Assessment: right unilateral gynecomastia Patient was first evaluated for gynecomastia in the clinic on 03/24/2016 and was referred for diagnostic mammography.  Findings were consistent with benign gynecomastia.  Patient followed up on 6/19 for ongoing right breast swelling.  At that visit he was evaluated for testosterone level, beta-hcg, LH, TSH and prolactin levels.  At the time testosterone was decreased and LH was elevated.  Prolactin level, TSH and beta-hCG were within normal limits.  He had repeat labs to confirm results however on repeat LH and FSH was normal and testosterone was still low.  At this point will want to get one more set of labs as his first set is consistent with primary hypogonadism and his second set consistent with secondary hypogonadism.  Will repeat LH and FSH, and add on iron, transferrin, cortisol and T4.  Asked patient to have lab drawn at 8:30am this week and make a follow up clinic visit.  Of note Patient states he is not taking finasteride and is taking amlodipine, pantoprazole and aspirin.  amlodipine was increased from 5mg  to 10mg  in 02/2016 prior to patient developing symptoms.  I wonder if this drug is causing his Gynecomastia as it is an adverse effect albeit small <1% with this drug.    Plan - LH and FSH,  iron, transferrin, cortisol and T4

## 2016-08-08 ENCOUNTER — Ambulatory Visit: Payer: Self-pay

## 2016-08-09 ENCOUNTER — Ambulatory Visit (INDEPENDENT_AMBULATORY_CARE_PROVIDER_SITE_OTHER): Payer: Self-pay | Admitting: Internal Medicine

## 2016-08-09 VITALS — BP 144/88 | Temp 98.2°F | Wt 205.9 lb

## 2016-08-09 DIAGNOSIS — K219 Gastro-esophageal reflux disease without esophagitis: Secondary | ICD-10-CM

## 2016-08-09 DIAGNOSIS — I10 Essential (primary) hypertension: Secondary | ICD-10-CM

## 2016-08-09 DIAGNOSIS — N529 Male erectile dysfunction, unspecified: Secondary | ICD-10-CM | POA: Insufficient documentation

## 2016-08-09 DIAGNOSIS — R7989 Other specified abnormal findings of blood chemistry: Secondary | ICD-10-CM

## 2016-08-09 DIAGNOSIS — Z8249 Family history of ischemic heart disease and other diseases of the circulatory system: Secondary | ICD-10-CM

## 2016-08-09 DIAGNOSIS — E785 Hyperlipidemia, unspecified: Secondary | ICD-10-CM

## 2016-08-09 DIAGNOSIS — Z87891 Personal history of nicotine dependence: Secondary | ICD-10-CM

## 2016-08-09 MED ORDER — SILDENAFIL CITRATE 50 MG PO TABS: 25.0000 mg | ORAL_TABLET | ORAL | 1 refills | Status: DC | PRN

## 2016-08-09 NOTE — Patient Instructions (Addendum)
It was a pleasure to meet you, Dylan Lane.   You were prescribed sildenafil (Viagra today). Please take 1 tablet 30 minutes - 1 hour before engaging in sexual intercourse.   Continue taking the rest of your medications as usual.   We will call you if your test results are abnormal.   Follow up in 6-12 months. Please call or make an appointment before this date if needed.

## 2016-08-09 NOTE — Assessment & Plan Note (Addendum)
Patient endorsing decreased libido and difficulty maintaining erections during sexual intercourse. He is currently being evaluated for etiology of low testosterone. Please see assessment and plan for low testosterone for further details.   - Will prescribe viagra 25mg  PRN for symptoms of erectile dysfunction while we further evaluate the cause of his low testosterone

## 2016-08-09 NOTE — Assessment & Plan Note (Addendum)
HPI: Patient presenting for follow up of low testosterone and abnormal labs. States he has been feeling well since last visit. Reports decreased R breast tenderness and denies further enlargement of R breast. Denies depressed mood, decreased energy, weight loss. Endorses decreased libido and difficulty maintaining erection during intercourse.   Initial labs consistent with primary hypogonadism (nl FSH, high LH,  and low T). However, repeat labs consistent with secondary hypogonadism (nl FSH and LH, and low T). He was last seen 7/30 at which time more labs were ordered ([place], LH, cortisol, iron, transferrin) to further evaluate etiology of low testosterone, but patient did not get them done.   - Labs: FSH, LH, cortisol, iron, and transferrin  - Will prescribe viagra 25mg  PRN for symptoms of erectile dysfunction while we further evaluate the cause of his low testosterone  - Advised to follow up in 6-12 months or before if needed. Will call if abnormal lab results.

## 2016-08-09 NOTE — Progress Notes (Signed)
   CC: Low testosterone follow up and decreased libido   HPI:  [name] is a 66 y.o.  male with history of HTN, HLD, GERD, and BPH presenting for follow up of low testosterone and decreased libido. Please see problem oriented assessment and plan for further details.   Past Medical History:  Diagnosis Date  . Colon polyps    3/06 and 2009:needs repeat in 3 yrs(3/12)  . GERD (gastroesophageal reflux disease) 2017  . Hyperlipidemia   . Hypertension   . Pre-diabetes    with fasting glucose of 110(2/09)  . Rectal bleeding    History of   Review of Systems:   Review of Systems  Constitutional: Positive for chills and fever.  Respiratory: Negative for shortness of breath.   Cardiovascular: Positive for chest pain. Negative for palpitations.  Gastrointestinal: Positive for abdominal pain.  Neurological: Positive for headaches.    Physical Exam:  Vitals:   08/09/16 0838  BP: (!) 144/88  SpO2: 100%  Weight: 205 lb 14.4 oz (93.4 kg)   General: pleasant male, well-nourished, well-developed, in NAD  HENT: NCAT, neck supple and FROM Cardiac: regular rate and rhythm, nl S1/S2, no murmurs, rubs or gallops   Pulm: CTAB, no wheezes or crackles, no increased work of breathing, R breast gynecomastia noted  Abd: soft, NTND, bowel sounds present   Assessment & Plan:   See Encounters Tab for problem based charting.  Patient seen with Dr. Gentry Roch, MD  Internal Medicine PGY-1  P [phone]

## 2016-08-09 NOTE — Progress Notes (Signed)
Internal Medicine Clinic Attending  I saw and evaluated the patient.  I personally confirmed the key portions of the history and exam documented by Dr. Santos-Sanchez and I reviewed pertinent patient test results.  The assessment, diagnosis, and plan were formulated together and I agree with the documentation in the resident's note. 

## 2016-08-10 LAB — CORTISOL: Cortisol: 9.1 ug/dL

## 2016-08-10 LAB — TRANSFERRIN: TRANSFERRIN: 233 mg/dL (ref 200–370)

## 2016-08-10 LAB — LUTEINIZING HORMONE: LH: 7.1 m[IU]/mL (ref 1.7–8.6)

## 2016-08-10 LAB — FOLLICLE STIMULATING HORMONE: FSH: 11 m[IU]/mL (ref 1.5–12.4)

## 2016-08-10 LAB — T4: T4, Total: 6.7 ug/dL (ref 4.5–12.0)

## 2016-08-10 LAB — IRON: Iron: 77 ug/dL (ref 38–169)

## 2016-08-15 NOTE — Progress Notes (Signed)
Internal Medicine Clinic Attending  Case discussed with Dr. Hoffman at the time of the visit.  We reviewed the resident's history and exam and pertinent patient test results.  I agree with the assessment, diagnosis, and plan of care documented in the resident's note.  

## 2016-08-17 ENCOUNTER — Telehealth: Payer: Self-pay

## 2016-08-17 NOTE — Telephone Encounter (Signed)
Requesting lab results. Please call pt back.  

## 2016-08-17 NOTE — Telephone Encounter (Signed)
Called pt for lab results.

## 2016-09-01 ENCOUNTER — Other Ambulatory Visit: Payer: Self-pay

## 2016-09-01 MED ORDER — PRAVASTATIN SODIUM 40 MG PO TABS: 40.0000 mg | ORAL_TABLET | Freq: Every day | ORAL | 3 refills | Status: DC

## 2016-09-01 NOTE — Telephone Encounter (Signed)
pravastatin (PRAVACHOL) 40 MG tablet, refill request.

## 2016-09-12 ENCOUNTER — Telehealth: Payer: Self-pay | Admitting: Internal Medicine

## 2016-09-12 NOTE — Telephone Encounter (Signed)
   Reason for call:   I received a call from [name] at 6:00  PM indicating that he fell from a stool while doing some work at home.,he hurt his back causing a bruise in his lower back. He is experiencing non radiating pain in his lower back. He denies any dizziness or palpitation. He denies any tingling or numbness or focal weakness. He denies any urinary or fecal incontinence. He was using some over-the-counter rubbing stuff which was helping.according to patient he did developed a bruise in that area.   Pertinent Data:      Assessment / Plan / Recommendations:   Most likely a muscular injury after having a accidental fall.I advised him to use ibuprofen 600 mg every 6 hourly for pain, he can use some icing and rubbing staff to help. I also advised him to come to the clinic in the morning for being evaluated.  As always, pt is advised that if symptoms worsen or new symptoms arise, they should go to an urgent care facility or to to ER for further evaluation.   Lorella Nimrod, MD   09/12/2016, 6:10 PM

## 2016-09-13 ENCOUNTER — Ambulatory Visit: Payer: Self-pay

## 2016-09-19 ENCOUNTER — Ambulatory Visit: Payer: Self-pay

## 2016-09-20 ENCOUNTER — Encounter: Payer: Self-pay | Admitting: Internal Medicine

## 2016-10-05 ENCOUNTER — Telehealth: Payer: Self-pay

## 2016-10-05 NOTE — Telephone Encounter (Signed)
Rtc, pt had a fall a few weeks ago, pain at R buttocks, check to be safe, appt [facility] 10/4

## 2016-10-05 NOTE — Telephone Encounter (Signed)
Asking to speak with Dylan Lane. Please call back.  

## 2016-10-06 ENCOUNTER — Ambulatory Visit: Payer: Self-pay

## 2016-10-06 ENCOUNTER — Telehealth: Payer: Self-pay | Admitting: Internal Medicine

## 2016-10-10 ENCOUNTER — Ambulatory Visit: Payer: Self-pay

## 2016-10-10 ENCOUNTER — Encounter: Payer: Self-pay | Admitting: Internal Medicine

## 2016-12-16 ENCOUNTER — Telehealth: Payer: Self-pay | Admitting: Internal Medicine

## 2016-12-16 NOTE — Telephone Encounter (Signed)
   Reason for call:   I received a call from [name] at 5:00 PM indicating he wants a refill on finesteride.   Pertinent Data:   Patient was diagnosed with BPH with urology and had previously been on finasteride but had not stopped it for some time due to possible contribution to gynecomastia. Patient had continued symptoms of BPH and had left over pills so he started taking them again; he endorses improvement of BPH symptoms and requests a new prescription for finasteride.    Assessment / Plan / Recommendations:   Asked patient to schedule [facility] or PCP appt for an evaluation and physical prior to consideration of formally restarting finasteride. Patient understood that it would not be appropriate to restart medicine over the phone without evaluation first.   Will sent message to [facility] front desk to call patient; advised patient that if he did not hear back by noon on Monday 12/17, to call the clinic himself.  As always, pt is advised that if symptoms worsen or new symptoms arise, they should go to an urgent care facility or to to ER for further evaluation.   Alphonzo Grieve, MD   12/16/2016, 5:16 PM

## 2016-12-22 ENCOUNTER — Other Ambulatory Visit: Payer: Self-pay | Admitting: *Deleted

## 2016-12-22 MED ORDER — FINASTERIDE 5 MG PO TABS: 5.0000 mg | ORAL_TABLET | Freq: Every day | ORAL | 3 refills | Status: DC

## 2016-12-26 ENCOUNTER — Telehealth: Payer: Self-pay | Admitting: Internal Medicine

## 2016-12-26 NOTE — Telephone Encounter (Signed)
   Reason for call:   I received a call from Mr. Gray Doering at 4:45 PM indicating continued urinary urgency.   Pertinent Data:   Patient had called last week to restart finasteride for BPH symptoms and script was sent to his pharmacy. Patient states that last time he checked (does not remember at what point last week), he was unable to fill it.   Patient endorses continued symptoms of urgency and incomplete emptying. These have not changed; patient denies hematuria, fever.   Assessment / Plan / Recommendations:   Advised patient script was sent 12/20 to [facility] on [address], which should be open until 6pm tonight for Christmas Eve. Patient will call pharmacy right now.  Advised patient that if symptoms change, if he were to develop fever, dysuria, hematuria to make appt with clinic or go to an urgent care.  As always, pt is advised that if symptoms worsen or new symptoms arise, they should go to an urgent care facility or to to ER for further evaluation.   Alphonzo Grieve, MD   12/26/2016, 4:44 PM

## 2017-01-04 ENCOUNTER — Ambulatory Visit (INDEPENDENT_AMBULATORY_CARE_PROVIDER_SITE_OTHER): Payer: Self-pay | Admitting: Internal Medicine

## 2017-01-04 ENCOUNTER — Encounter (INDEPENDENT_AMBULATORY_CARE_PROVIDER_SITE_OTHER): Payer: Self-pay

## 2017-01-04 ENCOUNTER — Encounter: Payer: Self-pay | Admitting: Internal Medicine

## 2017-01-04 VITALS — BP 138/86 | HR 71 | Temp 97.7°F | Wt 210.1 lb

## 2017-01-04 DIAGNOSIS — R3915 Urgency of urination: Secondary | ICD-10-CM

## 2017-01-04 DIAGNOSIS — R7303 Prediabetes: Secondary | ICD-10-CM

## 2017-01-04 DIAGNOSIS — R3914 Feeling of incomplete bladder emptying: Secondary | ICD-10-CM

## 2017-01-04 DIAGNOSIS — Z87891 Personal history of nicotine dependence: Secondary | ICD-10-CM

## 2017-01-04 DIAGNOSIS — I10 Essential (primary) hypertension: Secondary | ICD-10-CM

## 2017-01-04 DIAGNOSIS — R51 Headache: Secondary | ICD-10-CM

## 2017-01-04 DIAGNOSIS — N62 Hypertrophy of breast: Secondary | ICD-10-CM

## 2017-01-04 DIAGNOSIS — N401 Enlarged prostate with lower urinary tract symptoms: Secondary | ICD-10-CM

## 2017-01-04 DIAGNOSIS — N6489 Other specified disorders of breast: Secondary | ICD-10-CM

## 2017-01-04 DIAGNOSIS — R3912 Poor urinary stream: Secondary | ICD-10-CM

## 2017-01-04 DIAGNOSIS — N400 Enlarged prostate without lower urinary tract symptoms: Secondary | ICD-10-CM

## 2017-01-04 DIAGNOSIS — Z79899 Other long term (current) drug therapy: Secondary | ICD-10-CM

## 2017-01-04 DIAGNOSIS — T50995S Adverse effect of other drugs, medicaments and biological substances, sequela: Secondary | ICD-10-CM

## 2017-01-04 NOTE — Progress Notes (Signed)
   CC: Request to restart finasteride therapy  HPI:  Dylan Lane is a 67 y.o. with a PMH of BPH, hypertension and prediabetes who presents with a request to restart finasteride therapy for feeling of urgency and incomplete bladder emptying. He denies hematuria or fever.  Past Medical History:  Diagnosis Date  . Colon polyps    3/06 and 2009:needs repeat in 3 yrs(3/12)  . GERD (gastroesophageal reflux disease) 2017  . Hyperlipidemia   . Hypertension   . Pre-diabetes    with fasting glucose of 110(2/09)  . Rectal bleeding    History of   Review of Systems:   Constitutional: Negative for diaphoresis, fever, malaise/fatigue, and weight loss.  HEENT: Negative for blurred vision, hearing loss, sinus pain, congestion, and sore throat. Respiratory: Negative for cough, shortness of breath and wheezing.   Cardiovascular: Negative for chest pain, palpitations, orthopnea, PND, and leg swelling. Gastrointestinal: Negative for abdominal pain, blood in stool, constipation, diarrhea, heartburn, nausea and vomiting.  Genitourinary: Negative for dysuria and hematuria.  Musculoskeletal: Negative for joint pain and myalgias. Positive for right breast tenderness. Neurological: Negative for dizziness, focal weakness, weakness. Positive for occasional headaches relieved by ibuprofen.  Physical Exam:  Vitals:   01/04/17 1333  BP: (!) 167/88  Pulse: 71  Temp: 97.7 F (36.5 C)  TempSrc: Oral  SpO2: 99%  Weight: 210 lb 1.6 oz (95.3 kg)   GEN: Well-appearing, pleasant male sitting in chair. Alert and oriented. No acute distress.  HENT: [place]/AT. Moist mucous membranes. No visible lesions. EYES: PERRL. Sclera non-icteric. Conjunctiva clear. RESP: Clear to auscultation bilaterally. No wheezes, rales, or rhonchi. No increased work of breathing. CV: Normal rate and regular rhythm. No murmurs, gallops, or rubs. No LE edema. BREAST: Mild tenderness to palpation of right breast, over nipple. Mild  gynecomastia. No erythema or rash noted. No masses or nodules palpable. ABD: Soft. Non-tender. Non-distended. Normoactive bowel sounds. EXT: No edema. Warm and well perfused. NEURO: Cranial nerves II-XII grossly intact. Able to lift all four extremities against gravity. No apparent audiovisual hallucinations. Speech fluent and appropriate. PSYCH: Patient is calm and pleasant. Appropriate affect. Well-groomed; speech is appropriate and on-subject.  Assessment & Plan:   See Encounters Tab for problem based charting.  Patient seen with Dr. Evette Doffing

## 2017-01-04 NOTE — Assessment & Plan Note (Addendum)
Assessment Dylan Lane reports urinary frequency, urinary urgency, and decreased flow, consistent with his prior BPH symptoms. He was previously on terazosin, but began to experience dizziness particularly in the morning that would limit his ability to move around quickly, so this was discontinued and he started finasteride 5mg  daily. He was doing well on this therapy until he developed unilateral right gynecomastia with tenderness. He also complains of dry mouth while on this therapy. He had a mammogram in March 2018, which showed benign gynecomastia, and he states that he does check for nodules/masses every so often and he has not noticed any.  He stopped taking the finasteride with improvement in the breast tenderness, however he reports that his urinary symptoms returned. He started taking a few of the finasteride pills that he had with improvement in his urinary symptoms. We discussed that his main options are the medications like flo-max or the medications like finasteride, which unfortunately both have side effects. He states that the right breast tenderness is tolerable and does not typically affect his daily activity, and would like to continue the finasteride therapy for now.  PSA in 2017 was normal.  Plan - Continue finasteride 5mg  daily - Can consider Urology referral for surgical options if urinary symptoms return on finasteride or if side effects become intolerable.

## 2017-01-04 NOTE — Assessment & Plan Note (Signed)
Assessment BP initially 167/88, recheck was 138/86. Patient states that he is compliant with amlodipine 10mg  daily. He denies chest pain or palpitations. Has occasional headaches that are relieved with ibuprofen.  Plan - Continue amlodipine 10mg  daily - Follow up in 3 months

## 2017-01-04 NOTE — Patient Instructions (Addendum)
FOLLOW-UP INSTRUCTIONS When: 3 months For: BP management What to bring: medications and home BP log   Dylan Lane,  It was a pleasure to meet you today.  We discussed restarting the finasteride medicine for your prostate and urination. Please take finasteride 5mg  daily. If the tenderness in your breast starts to become too much, please call our clinic back so we can talk more about the plan for your prostate issue.  Please continue to take amlodipine 10mg  daily. Like we discussed today, please take your blood pressure at home every so often whenever you think of it. You can write down these numbers and bring them to your next visit.

## 2017-01-05 NOTE — Progress Notes (Signed)
Internal Medicine Clinic Attending  I saw and evaluated the patient.  I personally confirmed the key portions of the history and exam documented by Dr. Ronalee Red and I reviewed pertinent patient test results.  The assessment, diagnosis, and plan were formulated together and I agree with the documentation in the resident's note.  As a correction, chief complaint is follow up of BPH.

## 2017-01-05 NOTE — Addendum Note (Signed)
Addended by: Lalla Brothers T on: 01/05/2017 09:42 AM   Modules accepted: Level of Service

## 2017-01-14 ENCOUNTER — Telehealth: Payer: Self-pay | Admitting: Internal Medicine

## 2017-01-14 MED ORDER — PANTOPRAZOLE SODIUM 40 MG PO TBEC: 40.0000 mg | DELAYED_RELEASE_TABLET | Freq: Every day | ORAL | 0 refills | Status: DC

## 2017-01-14 NOTE — Telephone Encounter (Signed)
   Reason for call:   I received a call from Mr. Keelin Sheridan at 3:40 PM indicating that he has run out of protonix and he is in need of a refill.   Pertinent Data:   Patient has a history of GERD, has heartburn which is worse at night but is controlled when he takes protonix. He has run out of the protonix and request a refill.   Assessment / Plan / Recommendations:   Sent refill for protonix to his pharmacy Encouraged to call pharmacy in advance when Rx are getting low   As always, pt is advised that if symptoms worsen or new symptoms arise, they should go to an urgent care facility or to to ER for further evaluation.   Ledell Noss, MD   01/14/2017, 3:41 PM

## 2017-02-14 ENCOUNTER — Other Ambulatory Visit: Payer: Self-pay | Admitting: Internal Medicine

## 2017-02-14 ENCOUNTER — Telehealth: Payer: Self-pay | Admitting: Internal Medicine

## 2017-02-14 MED ORDER — PANTOPRAZOLE SODIUM 40 MG PO TBEC: 40.0000 mg | DELAYED_RELEASE_TABLET | Freq: Every day | ORAL | 2 refills | Status: DC

## 2017-02-14 NOTE — Telephone Encounter (Signed)
   Reason for call:   I received a call from Mr. Clayvon Parlett at 5:50  PM indicating he ran out of his prescription for Protonix and asking for a refill.   Pertinent Data:   Patient has an history of GERD, stating that his symptoms get worse if he does not take his Protonix.   Assessment / Plan / Recommendations:   Prescription was sent to his pharmacy, he was also advised to contact clinic before he ran out of his medicine.  As always, pt is advised that if symptoms worsen or new symptoms arise, they should go to an urgent care facility or to to ER for further evaluation.   Lorella Nimrod, MD   02/14/2017, 5:54 PM

## 2017-02-21 ENCOUNTER — Ambulatory Visit: Payer: BLUE CROSS/BLUE SHIELD | Admitting: Internal Medicine

## 2017-02-21 ENCOUNTER — Other Ambulatory Visit: Payer: Self-pay

## 2017-02-21 ENCOUNTER — Encounter (INDEPENDENT_AMBULATORY_CARE_PROVIDER_SITE_OTHER): Payer: Self-pay

## 2017-02-21 ENCOUNTER — Telehealth: Payer: Self-pay

## 2017-02-21 ENCOUNTER — Other Ambulatory Visit: Payer: Self-pay | Admitting: Internal Medicine

## 2017-02-21 VITALS — BP 141/83 | HR 77 | Temp 98.0°F | Ht 67.0 in | Wt 205.8 lb

## 2017-02-21 DIAGNOSIS — I10 Essential (primary) hypertension: Secondary | ICD-10-CM | POA: Diagnosis not present

## 2017-02-21 DIAGNOSIS — Z79899 Other long term (current) drug therapy: Secondary | ICD-10-CM

## 2017-02-21 DIAGNOSIS — R3914 Feeling of incomplete bladder emptying: Secondary | ICD-10-CM

## 2017-02-21 DIAGNOSIS — N401 Enlarged prostate with lower urinary tract symptoms: Secondary | ICD-10-CM | POA: Diagnosis not present

## 2017-02-21 DIAGNOSIS — R351 Nocturia: Secondary | ICD-10-CM

## 2017-02-21 DIAGNOSIS — R0789 Other chest pain: Secondary | ICD-10-CM

## 2017-02-21 DIAGNOSIS — N400 Enlarged prostate without lower urinary tract symptoms: Secondary | ICD-10-CM

## 2017-02-21 MED ORDER — TAMSULOSIN HCL 0.4 MG PO CAPS: 0.4000 mg | ORAL_CAPSULE | Freq: Every day | ORAL | 2 refills | Status: DC

## 2017-02-21 MED ORDER — CHLORTHALIDONE 25 MG PO TABS: 25.0000 mg | ORAL_TABLET | Freq: Every day | ORAL | 2 refills | Status: DC

## 2017-02-21 MED ORDER — AMLODIPINE BESYLATE 10 MG PO TABS: 10.0000 mg | ORAL_TABLET | Freq: Every day | ORAL | 0 refills | Status: DC

## 2017-02-21 NOTE — Progress Notes (Signed)
   CC: blood pressure recheck  HPI:  Dylan Lane is a 67 y.o. male with uncontrolled hypertension presented to the clinic today for continued evaluation and management. For detailed evaluation and management plan of his hypertension please refer to problem charting below.  Past Medical History:  Diagnosis Date  . Colon polyps    3/06 and 2009:needs repeat in 3 yrs(3/12)  . GERD (gastroesophageal reflux disease) 2017  . Hyperlipidemia   . Hypertension   . Pre-diabetes    with fasting glucose of 110(2/09)  . Rectal bleeding    History of   Review of Systems:  12 point ROS preformed. All negative aside from those mentioned in the HPI.  Physical Exam: Vitals:   02/21/17 1424  BP: (!) 141/83  Pulse: 77  Temp: 98 F (36.7 C)  TempSrc: Oral  SpO2: 99%  Weight: 205 lb 12.8 oz (93.4 kg)  Height: 5\' 7"  (1.702 m)   General: Well nourished male in no acute distress HENT: Normocephalic, atraumatic, moist mucus membranes  Pulm: Good air movement with no wheezing or crackles  CV: RRR, no murmurs, no rubs  Abdomen: Active bowel sounds, soft, non-distended, no tenderness to palpation  Extremities: Pulses palpable in the upper extremities bilaterally, no LE edema  Skin: Warm and dry  Neuro: Alert and oriented  Assessment & Plan:   See Encounters Tab for problem based charting.  Patient discussed with Dr. Lynnae January

## 2017-02-21 NOTE — Telephone Encounter (Signed)
Patient is calling back, patient check bp 149/102 pulse: 78. Pains on the left side head and raise eye brow.

## 2017-02-21 NOTE — Telephone Encounter (Signed)
Would like to speak Dylan Lane. Please call pt back.

## 2017-02-21 NOTE — Patient Instructions (Signed)
Thank you for allowing [place] to provide your care. I have sent out a prescription for chlorine valid on 25 mg. You'll take this once daily in addition to your amlodipine. Please continue all other medications as prescribed. I would like you to come back to the clinic in three weeks to have your blood pressure rechecked and have some lab work done.

## 2017-02-21 NOTE — Assessment & Plan Note (Signed)
Patient continues to experience symptoms of BPH including nocturia and sensation of inability to empty his bladder. He is currently on finasteride 5 mg daily without adverse effect. We discussed the trial of tamsulosin and stopping the finasteride. He agrees.  Plan: - Stop Finasteride  - Start Tamsulosin 0.4 mg QD, if not working at follow-up can increase to 0.8mg  QD

## 2017-02-21 NOTE — Telephone Encounter (Signed)
Agree with 911, [facility] if pt refuses.

## 2017-02-21 NOTE — Assessment & Plan Note (Signed)
Patient presented to the clinic today for continued evaluation and management of his uncontrolled hypertension. He states that he is compliant with his amlodipine 10 mg once daily. He does check his blood pressure at home and states that it is consistently greater than 140/90. He denies headaches, vision changes, shortness of breath, chest pain, abdominal pain, hematuria, myalgias, focal weakness or numbness. He denies any adverse effects to the amlodipine.  On physical exam vital signs are remarkable for blood pressure 140/83. Otherwise physical exam is benign.  I discussed starting for their blood pressure management with the patient and he agreed to start chlorthalidone 25 mg daily in addition to his amlodipine 10 mg daily. He will come back in three weeks for a blood pressure recheck and lab work.  Plan: - START chlorthalidone 25 mg QD - Continue Amlodipine 10 mg QD - Follow-up in 2 weeks for BP recheck and BNP

## 2017-02-21 NOTE — Telephone Encounter (Signed)
Spoke w/ pt, h/a started this am suddenly, when pain comes causes eyebrow to arch,  cautioned pt if speech, vision, weakness one side, N&V, chest pain, short of breath to call 911, appt acc 1445

## 2017-02-22 ENCOUNTER — Telehealth: Payer: Self-pay | Admitting: Internal Medicine

## 2017-02-22 DIAGNOSIS — M5481 Occipital neuralgia: Secondary | ICD-10-CM

## 2017-02-22 MED ORDER — GABAPENTIN 300 MG PO CAPS: 600.0000 mg | ORAL_CAPSULE | Freq: Every day | ORAL | 0 refills | Status: DC

## 2017-02-22 NOTE — Telephone Encounter (Signed)
Patient is wanted to receive a call from physician

## 2017-02-22 NOTE — Telephone Encounter (Signed)
   Reason for call:   I received a call from [name] at 7 PM indicating that he was prescribed gabapentin today and while looking at the side effects he was afraid that he might get angry and needing advise regarding taking gabapentin.   Pertinent Data:   Gabapentin was prescribed today because of possible occipital neuralgia, 600 mg at bedtime, I advised the patient to start with 300 mg tonight and if he does not experience any bad side effects he can increase it to 600 mg from tomorrow night.  His headache was much improved today he continued to get intermittent spasm-like  pain.if he experience any side effect he can call the clinic in the morning and we can make necessity changes.   Assessment / Plan / Recommendations:   Worried about possible side effects of gabapentin.  Patient was reassured.  As always, pt is advised that if symptoms worsen or new symptoms arise, they should go to an urgent care facility or to to ER for further evaluation.   Lorella Nimrod, MD   02/22/2017, 7:15 PM

## 2017-02-22 NOTE — Telephone Encounter (Signed)
Pt calls and states he feels no better, states "do they think maybe a tylenol might help, that eyebrow twitching and jumping is driving me crazy and they side of my head just wont stop" spoke w/ dr Evette Doffing, he stated yes to tylenol and he will have dr Tarri Abernethy f/u via ph w/ pt. Pt is agreeable

## 2017-02-22 NOTE — Telephone Encounter (Signed)
Patient called about continuing headaches. He was seen and evaluated by myself yesterday during [place]. I failed to mentioned this in my prior note and apologize.   Patient states that he began to experience pain on the top of his head that started on the morning of 2/19. They were stabbing in nature and associated with eyebrow twitching. The pain lasted a few minutes at most then subsided and the "attacks" were very infrequent. He denied vision changes, dizziness, or extension of the pain around his eye and further ROS was negative. He denied recent trauma.  On PE yesterday the area was not tender to palpation and could not be reproduced. Neuro exam was otherwise unremarkable.   We discuss that the cause of this pain was uncertain but I did ask the patient to monitor it and if thing worsened to call the clinic.   Today he called that the pain is now persistent, the area is very tender to touch and the pain is reproducible with light touch (worse when he wears his hat), he also feels very tired and sluggish. He denies fevers, visual changes, muscle weakness, rash. The pain remains on the top of his scalp over the apex of the parietal bone.   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 We discussed a trial of gabapentin. Discussed alarm symptoms including vision changes, fevers, pain or weakness in the hips or shoulders and to return if he experiences anything different. He voices understanding and is comfortable with the plan.

## 2017-02-23 ENCOUNTER — Telehealth: Payer: Self-pay | Admitting: Internal Medicine

## 2017-02-23 NOTE — Telephone Encounter (Signed)
Called patient to see how he was doing today. He did take 300 mg Gabapentin last night before bed. This am he is no longer having the scalp pain and feels well. Discussed continuing the medication for a week or so then trying to come off of the gabapentin. He expresses understanding. He will follow-up in 2 weeks for a BP recheck and BMP.

## 2017-02-24 NOTE — Progress Notes (Signed)
Internal Medicine Clinic Attending  Case discussed with Dr. Helberg at the time of the visit.  We reviewed the resident's history and exam and pertinent patient test results.  I agree with the assessment, diagnosis, and plan of care documented in the resident's note.    

## 2017-02-28 ENCOUNTER — Telehealth: Payer: Self-pay | Admitting: Internal Medicine

## 2017-02-28 NOTE — Telephone Encounter (Signed)
   Reason for call:   I received a call from [name] at 6:45 PM indicating that his plantar surface of his right foot is hurting.   Pertinent Data:   According to patient he occasionally get foot pain on and off, pain started this afternoon he is unable to walk on that foot, he was walking on the side of his foot, his pain gets little better when he is not walking or keep his foot elevated.  He denies any swelling or recent injury.  He was asking he was prescribed gabapentin whether that will help with this pain or not.  He denies any burning sensation.   Assessment / Plan / Recommendations:   He was advised to take ibuprofen 800 mg 1 time tonight, he can take his normal dose of gabapentin at bedtime.  If his pain continued to get worse or does not improve he should come to the clinic tomorrow morning for evaluation.  As always, pt is advised that if symptoms worsen or new symptoms arise, they should go to an urgent care facility or to to ER for further evaluation.   Lorella Nimrod, MD   02/28/2017, 6:58 PM

## 2017-02-28 NOTE — Telephone Encounter (Signed)
Patient wants the physician/Helen, patient has a question

## 2017-02-28 NOTE — Telephone Encounter (Signed)
Lm fo rtc

## 2017-03-03 ENCOUNTER — Ambulatory Visit: Payer: BLUE CROSS/BLUE SHIELD

## 2017-03-03 ENCOUNTER — Telehealth: Payer: Self-pay | Admitting: Internal Medicine

## 2017-03-03 NOTE — Telephone Encounter (Signed)
Pt c/o swelling of feet x 2 days; states this has happened before but "it has been a while". States he has been elevating his feet but has not helped. Some pain when walking. Given an appt for today @ 1515 PM in [facility]

## 2017-03-03 NOTE — Telephone Encounter (Signed)
Patient is calling back for from message left yesterday from [facility]

## 2017-03-03 NOTE — Telephone Encounter (Signed)
Stated "pressure" of right foot. Informed to try acetaminophen per Dr Lynnae January until seen on Monday. And if this does not help, he can call the doctor on call over the w/e. Voiced understanding.

## 2017-03-03 NOTE — Telephone Encounter (Signed)
Ibu may increase swelling. Otherwise, his Cr OK as of 01/2016 and no h/o GI bleeding. Might try acetaminophen first.

## 2017-03-03 NOTE — Telephone Encounter (Signed)
Pt called back - stated he cannot come today. Wants to know is there something he can take for the pain? Informed his on Ibuprofen - stated he has been taking 4 tabs a day. Appt re-scheduled for Monday in [facility]. Please advise   Thanks

## 2017-03-06 ENCOUNTER — Encounter: Payer: Self-pay | Admitting: Internal Medicine

## 2017-03-06 ENCOUNTER — Ambulatory Visit: Payer: BLUE CROSS/BLUE SHIELD

## 2017-03-18 ENCOUNTER — Telehealth: Payer: Self-pay | Admitting: Internal Medicine

## 2017-03-18 NOTE — Telephone Encounter (Signed)
   Reason for call:   I received a call from [name] at 21:56 PM indicating his new BP medication is making him dizzy.   Pertinent Data:   He was previously on amlodipine 10 mg daily.  He was started on chlorthalidone 25 mg daily during his previous visit in the clinic on February 21, 2017.  Patient states he was doing just fine taking amlodipine in the past but has started feeling dizzy since he has been started on chlorthalidone. Last dose was earlier today. He is not sure what his blood pressure has been recently.   Also states his R forearm has been feeling sore with slight numbness today because he had his elbow in a bent position for too long. Soreness and slight numbness extend from the elbow to the fingers. No arm weakness.  No weakness or numbness in any other extremity. No headaches. Speech fluent.   No CP or SOB.    Assessment / Plan / Recommendations:   Dizziness could possibly be orthostatic in nature.  Advised him to continue taking amlodipine but hold the dose of chlorthalidone tomorrow.  Advised him to seek medical attention if he continues to have dizziness despite holding the dose of chlorthalidone.  Also advised him to return to the clinic on Monday for blood pressure recheck.  The right forearm soreness and slight numbness could possibly be due to ulnar nerve irritation from having his elbow in a bent position for too long.   As always, pt is advised that if symptoms worsen or new symptoms arise, they should go to an urgent care facility or to to ER for further evaluation.   Shela Leff, MD   03/18/2017, 10:07 PM

## 2017-03-18 NOTE — Telephone Encounter (Signed)
Please read note from previous phone encounter a few minutes ago. Patient called again to inform me he checked his BP at home and it was 133/91 with pulse 87. I made the same recommendations again as mentioned in my previous note. I have also messaged the clinic staff to ensure patient is scheduled for a follow-up on Monday 3/18.

## 2017-03-19 NOTE — Telephone Encounter (Signed)
Perfect. Thanks Vasu!

## 2017-04-05 ENCOUNTER — Other Ambulatory Visit: Payer: Self-pay

## 2017-04-05 ENCOUNTER — Encounter (HOSPITAL_COMMUNITY): Payer: Self-pay | Admitting: Emergency Medicine

## 2017-04-05 ENCOUNTER — Telehealth: Payer: Self-pay

## 2017-04-05 ENCOUNTER — Ambulatory Visit (HOSPITAL_COMMUNITY)
Admission: EM | Admit: 2017-04-05 | Discharge: 2017-04-05 | Disposition: A | Discharge status: Home / Self Care | Payer: BLUE CROSS/BLUE SHIELD | Attending: Family Medicine | Admitting: Family Medicine

## 2017-04-05 DIAGNOSIS — R0789 Other chest pain: Secondary | ICD-10-CM | POA: Diagnosis not present

## 2017-04-05 MED ORDER — DICLOFENAC SODIUM 75 MG PO TBEC: 75.0000 mg | DELAYED_RELEASE_TABLET | Freq: Two times a day (BID) | ORAL | 0 refills | Status: DC

## 2017-04-05 NOTE — Discharge Instructions (Signed)
Please return here or to the Emergency Department immediately should you feel worse in any way or have any of the following symptoms: increasing or different chest pain, pain that spreads to your arm, neck, jaw, back or abdomen, shortness of breath, or nausea and vomiting. ° °

## 2017-04-05 NOTE — ED Triage Notes (Signed)
Pt sts mid sternal chest tightness worse with movement

## 2017-04-05 NOTE — Telephone Encounter (Signed)
Would like to speak with a nurse about something.

## 2017-04-06 ENCOUNTER — Other Ambulatory Visit: Payer: Self-pay | Admitting: Internal Medicine

## 2017-04-06 DIAGNOSIS — R0789 Other chest pain: Secondary | ICD-10-CM
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
## 2017-04-06 NOTE — Telephone Encounter (Signed)
Refill Request.  

## 2017-04-10 NOTE — Telephone Encounter (Signed)
Called patient to learn how he was feeling following ED visit for chest pain. States he's, "doing good right now." Was told that EKG showed pain wasn't coming from heart but maybe a chest wall muscle. Has appt already scheduled in [facility] 04/24/2017. Hubbard Hartshorn, RN, BSN

## 2017-04-22 ENCOUNTER — Other Ambulatory Visit: Payer: Self-pay | Admitting: Internal Medicine

## 2017-04-22 DIAGNOSIS — R0789 Other chest pain: Secondary | ICD-10-CM

## 2017-04-22 NOTE — Telephone Encounter (Signed)
   Reason for call:   I received a call from Mr. Kylon Philbrook at 4:00  PM indicating that he has run out of amlodipine and needs a refill of the medication .   Pertinent Data:   BP 140/83 at last office visit in February   Due for follow up BP    Assessment / Plan / Recommendations:   Amlodipine refilled   Forwarding to the front desk to schedule [facility] follow up for BP check   As always, pt is advised that if symptoms worsen or new symptoms arise, they should go to an urgent care facility or to to ER for further evaluation.   Ledell Noss, MD   04/22/2017, 4:12 PM

## 2017-04-24 ENCOUNTER — Ambulatory Visit (INDEPENDENT_AMBULATORY_CARE_PROVIDER_SITE_OTHER): Payer: BLUE CROSS/BLUE SHIELD | Admitting: Internal Medicine

## 2017-04-24 ENCOUNTER — Encounter: Payer: Self-pay | Admitting: Internal Medicine

## 2017-04-24 ENCOUNTER — Encounter (INDEPENDENT_AMBULATORY_CARE_PROVIDER_SITE_OTHER): Payer: Self-pay

## 2017-04-24 ENCOUNTER — Other Ambulatory Visit: Payer: Self-pay

## 2017-04-24 VITALS — BP 126/72 | HR 86 | Temp 98.0°F | Ht 67.0 in | Wt 202.2 lb

## 2017-04-24 DIAGNOSIS — Z0000 Encounter for general adult medical examination without abnormal findings: Secondary | ICD-10-CM

## 2017-04-24 DIAGNOSIS — N401 Enlarged prostate with lower urinary tract symptoms: Secondary | ICD-10-CM

## 2017-04-24 DIAGNOSIS — I10 Essential (primary) hypertension: Secondary | ICD-10-CM | POA: Diagnosis not present

## 2017-04-24 DIAGNOSIS — R3914 Feeling of incomplete bladder emptying: Secondary | ICD-10-CM | POA: Diagnosis not present

## 2017-04-24 DIAGNOSIS — F1910 Other psychoactive substance abuse, uncomplicated: Secondary | ICD-10-CM | POA: Diagnosis not present

## 2017-04-24 DIAGNOSIS — Z79899 Other long term (current) drug therapy: Secondary | ICD-10-CM

## 2017-04-24 DIAGNOSIS — N400 Enlarged prostate without lower urinary tract symptoms: Secondary | ICD-10-CM

## 2017-04-24 DIAGNOSIS — R7303 Prediabetes: Secondary | ICD-10-CM | POA: Diagnosis not present

## 2017-04-24 DIAGNOSIS — R0789 Other chest pain: Secondary | ICD-10-CM | POA: Diagnosis not present

## 2017-04-24 DIAGNOSIS — D126 Benign neoplasm of colon, unspecified: Secondary | ICD-10-CM

## 2017-04-24 NOTE — Assessment & Plan Note (Addendum)
Assessment Reports improvement in BPH symptoms after switching from finasteride to tamsulosin 0.4mg  daily. Discussed risks and benefits of increase tamsulosin dose. Patient states that his symptoms are not bothersome to the point that he cannot complete his daily activities, and thus he prefers to remain at his current dose for now.   Plan - Continue tamsulosin 0.4mg  daily --- Can consider uptitrating to tamsulosin 0.8mg  daily if symptoms start to affect his ability to complete daily activities (with caution for orthostatic hypotension) - F/u in 3 months

## 2017-04-24 NOTE — Patient Instructions (Signed)
FOLLOW-UP INSTRUCTIONS When: 3 months For: BP management What to bring: medications   Dylan Lane,  It was good to see you again.  For your blood pressure, please continue to take amlodipine 10mg  daily. You do not need to take the chlorthalidone.  We are a checking a test for hepatitis C today. I will let you know the results when I get them back.  We are going to keep your prostate medicine the same for now. Please continue to take flomax 0.4mg  daily. If your symptoms start getting more bothersome, please call us back and we can talk about increasing your dose.  Please come back to see [place] in about 3 months or sooner if needed.  If you have any questions or concerns, call our clinic at [phone] or after hours call [phone] and ask for the internal medicine resident on call.

## 2017-04-24 NOTE — Assessment & Plan Note (Addendum)
HCV Screening - HCV Ab screen ordered ADDENDUM: HCV Ab negative  Colon cancer screening Patient is on 5-year schedule. Last report in our chart is 2012, however he states that he had a colonoscopy in 2018 with Dr. Collene Mares in [place]. Will attempt to obtain records.  Tetanus vaccination Last TDAP in 2008, he is due for another. He declines vaccination today and states he will get this at the next visit

## 2017-04-24 NOTE — Assessment & Plan Note (Signed)
Reports he had a colonoscopy in 2018 with Dr. Collene Mares on [facility]. States he was told there were two non-cancerous polyps and that he was due for another in 5 years. Patient left prior to signing release of records. States he will be able to come back tomorrow morning to sign the paper and we will attempt to obtain record of his colonoscopy once paperwork signed.

## 2017-04-24 NOTE — Assessment & Plan Note (Addendum)
Assessment BP well-controlled at 126/72 today. Chlorthalidone was started at last visit, however he states this caused him to have headaches, so he self-discontinued the chlorthalidone. He reports compliance with amlodipine 10mg  daily.  Plan - Continue amlodipine 10mg  daily - Stop chlorthalidone - F/u in 3 months

## 2017-04-24 NOTE — Progress Notes (Signed)
   CC: management of BP  HPI:  Mr.Kaidon Guardia is a 67 y.o. male with PMH of pre-diabetes and HTN who presents for management of BP  BP: Seen in clinic on 2/19 and started on chlorthalidone 25mg  daily. Amlodipine 10mg  daily was continued. He states that he started having headaches with the chlorthalidone, so he stopped taking the chlorthalidone and is only on amlodipine. Denies HA, lightheadedness, CP, or SOB.  Chest wall pain: Seen in the ED on 4/3 for chest wall pain with negative EKG, thought to be related to costochondritis. He was discharged with voltaren gel. He denies chest pain today.  BPH: In clinic on 2/19, finasteride was stopped and he was started on trial of tamsulosin 0.4mg  daily due to persistent symptoms of BPH, including nocturia and sensation of incomplete bladder emptying. He reports improvement in his symptoms since starting tamsulosin. He is able to sleep through the night without awakening to use the restroom and only occasionally feels the sensation of incomplete bladder emptying. He reports that the symptoms are improved and only minimally bothersome. He does state that the sensation is worse if he stands for long periods of time, however if he sits down, he is able to manage the symptoms. Discussed whether to increase the flomax today or not with the main side effect being orthostatic hypotension. Patient prefers to remain at his current dose for now.  Please see the assessment and plan below for the status of the patient's chronic medical problems.  Past Medical History:  Diagnosis Date  . Colon polyps    3/06 and 2009:needs repeat in 3 yrs(3/12)  . GERD (gastroesophageal reflux disease) 2017  . Hyperlipidemia   . Hypertension   . Pre-diabetes    with fasting glucose of 110(2/09)  . Rectal bleeding    History of   Review of Systems:   GEN: Positive for 2lb weight loss (intentional) CV: Neg for chest pain PULM: Neg for SOB GU: Positive for intermittent  sensation of incomplete bladder emptying. Negative for nocturia MSK: Negative for LE edema NEURO: Neg for headaches or lightheadedness  Physical Exam:  Vitals:   04/24/17 1413  BP: 126/72  Pulse: 86  Temp: 98 F (36.7 C)  TempSrc: Oral  SpO2: 98%  Weight: 202 lb 3.2 oz (91.7 kg)  Height: 5\' 7"  (1.702 m)   GEN: Sitting in chair in NAD CV: NR & RR, no m/r/g PULM: CTAB, no wheezes or rales ABD: Soft, NT, ND, +BS, obese MSK: No LE edema  Assessment & Plan:   See Encounters Tab for problem based charting.  Patient discussed with Dr. Angelia Mould

## 2017-04-25 LAB — HEPATITIS C ANTIBODY: Hep C Virus Ab: 0.4 s/co ratio (ref 0.0–0.9)

## 2017-04-25 NOTE — Progress Notes (Signed)
Internal Medicine Clinic Attending  Case discussed with Dr. Huang at the time of the visit.  We reviewed the resident's history and exam and pertinent patient test results.  I agree with the assessment, diagnosis, and plan of care documented in the resident's note. 

## 2017-04-26 ENCOUNTER — Telehealth: Payer: Self-pay | Admitting: Internal Medicine

## 2017-04-26 NOTE — Telephone Encounter (Signed)
Patient is calling checking on lab results, please call patient

## 2017-04-26 NOTE — Telephone Encounter (Signed)
Spoke to patient via phone to inform him of results. Thanks

## 2017-05-01 ENCOUNTER — Telehealth: Payer: Self-pay | Admitting: Internal Medicine

## 2017-05-01 NOTE — Telephone Encounter (Signed)
   Reason for call:   I received a call from [name] at 9:30 PM with questions about bedbugs.   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   Assessment / Plan / Recommendations:   Advised patient to change clothes and immediately wash in hot water after coming home from work; if he does think they have transferred to his home, to take all linens and bag them up then wash in hot water if they are not replaceable. Advised if he does get bite not to scratch at the site to reduce risk of secondary skin infection. Advised he may be provided with isolation garments at his work to help prevent risk of transmission.  Patient had no further questions.   Alphonzo Grieve, MD   05/01/2017, 9:34 PM

## 2017-05-15 ENCOUNTER — Other Ambulatory Visit: Payer: Self-pay | Admitting: Internal Medicine

## 2017-05-22 ENCOUNTER — Telehealth: Payer: Self-pay | Admitting: *Deleted

## 2017-05-22 NOTE — Telephone Encounter (Signed)
Pt calls and states he had a sharp sudden onset pain in his sleep to his lower L side, just above buttocks last night, awoke him from sleep. States intense feeling  went away but discomfort continues as nagging, tight discomfort. ACC wed 5/22 at 1015

## 2017-05-23 ENCOUNTER — Telehealth: Payer: Self-pay | Admitting: Internal Medicine

## 2017-05-23 NOTE — Telephone Encounter (Signed)
   Reason for call:   I received a call from [name] at 5:00 PM indicating new onset back pain.   Pertinent Data:   Patient states left sided back pain began last night while he was adjusting position in bed - sudden onset, sharp, radiates to his buttock, worse with extension. Is ssociated with limp when walking but pain not worsened with ambulation. Pain worsened when trying to scoot himself up in bed. 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 He denies numbness/tinling in his LEs, denies bowel or bladder incontinence. He denies dysuria or hematuria.   Pain not relieved with 2x OTC tylenol.   Assessment / Plan / Recommendations:   Pain likely musculoskeletal from muscle strain or possible disc herniation as related to sudden movement.  Advised patient to take 2 OTC ibuprofen (renal function over 1 yr ago was wnl, no h/o PUD) and to call clinic once it is open to set up appointment for evaluation.  Advised use of heating pad but he does not have one at home.  Advised pain may not be completely relieved with ibuprofen but should improve some.   As always, pt is advised that if symptoms worsen or new symptoms arise, they should go to an urgent care facility or to to ER for further evaluation.   Alphonzo Grieve, MD   05/23/2017, 5:05 AM

## 2017-05-24 ENCOUNTER — Ambulatory Visit: Payer: BLUE CROSS/BLUE SHIELD

## 2017-05-31 ENCOUNTER — Other Ambulatory Visit: Payer: Self-pay | Admitting: Internal Medicine

## 2017-05-31 DIAGNOSIS — N400 Enlarged prostate without lower urinary tract symptoms: Secondary | ICD-10-CM

## 2017-06-25 ENCOUNTER — Encounter: Payer: Self-pay | Admitting: *Deleted

## 2017-08-07 ENCOUNTER — Encounter: Payer: Self-pay | Admitting: Internal Medicine

## 2017-08-07 NOTE — Progress Notes (Signed)
Patient called to discuss concerns for chest pain. The pain began the prior day (08/07/2017) ~1 hour after eating. It was located in the left axilla and shoulder area. It was sharp in nature, improved w/in 20 min of treating himself with two tylenol. This pain again returned on 08/07/2017 ~1 hour after eating. He stated that it had again resolved with tylenol but returned later prompting the call. The pain was not associated with activity, stretching, movement of the arm but did resolve if he placed his hand over the affected area and relaxed. The pain was 4/10 in severity, not crushing or pressure like in nature, was not accompanied by nausea, diaphoresis, dyspnea, or neck/jaw pain and not worse while walking around the grocery store although it remained while walking. The patient does not have an extensive CAD history as per chart review and likewise lacks cardiac testing aside from EKG's with the most recent on 04/05/2017 failing to indicate ischemic changes. He has risk factors for ACS such as HTN, HLD, age, and sex.   Given the persistence of the chest pain, the patients risk factors and location/nature of the pain I advised him to seek care in the emergency department. He stated that he understood what I was advising but since I had not portrayed to him that I was convinced this was cardiac chest pain, he would like to think about it. I again reiterated that I could not be sure over the phone that this was not cardiac chest pain and felt that he needed evaluation tonight given the recurrent nature and location as well as the non-reproducibilty or focal tenderness typical of musculoskeletal pain.   Kathi Ludwig, MD Internal Medicine PGY-2 Pager # [phone]

## 2017-08-08 ENCOUNTER — Other Ambulatory Visit: Payer: Self-pay

## 2017-08-08 ENCOUNTER — Encounter (INDEPENDENT_AMBULATORY_CARE_PROVIDER_SITE_OTHER): Payer: Self-pay

## 2017-08-08 ENCOUNTER — Ambulatory Visit: Payer: BLUE CROSS/BLUE SHIELD | Admitting: Internal Medicine

## 2017-08-08 DIAGNOSIS — R7303 Prediabetes: Secondary | ICD-10-CM

## 2017-08-08 DIAGNOSIS — I10 Essential (primary) hypertension: Secondary | ICD-10-CM | POA: Diagnosis not present

## 2017-08-08 DIAGNOSIS — M62838 Other muscle spasm: Secondary | ICD-10-CM | POA: Diagnosis not present

## 2017-08-08 DIAGNOSIS — N400 Enlarged prostate without lower urinary tract symptoms: Secondary | ICD-10-CM | POA: Diagnosis not present

## 2017-08-08 DIAGNOSIS — Z86010 Personal history of colonic polyps: Secondary | ICD-10-CM

## 2017-08-08 MED ORDER — DICLOFENAC SODIUM 1 % TD GEL: 2.0000 g | Freq: Four times a day (QID) | TRANSDERMAL | 0 refills | Status: DC

## 2017-08-08 NOTE — Progress Notes (Signed)
   CC: Muscle spasms  HPI:  [name] is a 67 y.o. male with essential hypertension, prediabetes, adenomatous colonic polyps, BPH who presents for muscle spasms. Please see problem based charting for evaluation, assessment, and plan.  Past Medical History:  Diagnosis Date  . Colon polyps    3/06 and 2009:needs repeat in 3 yrs(3/12)  . GERD (gastroesophageal reflux disease) 2017  . Hyperlipidemia   . Hypertension   . Pre-diabetes    with fasting glucose of 110(2/09)  . Rectal bleeding    History of   Review of Systems:   Denies shortness of breath, chest pain   Physical Exam:  Vitals:   08/08/17 1509  BP: 133/81  Pulse: 81  Temp: 98.2 F (36.8 C)  TempSrc: Oral  SpO2: 99%  Weight: 201 lb 12.8 oz (91.5 kg)  Height: 5\' 7"  (1.702 m)    Physical Exam  Constitutional: He appears well-developed and well-nourished. No distress.  HENT:  Head: Normocephalic and atraumatic.  Eyes: Conjunctivae are normal.  Cardiovascular: Normal rate, regular rhythm and normal heart sounds.  Respiratory: Effort normal and breath sounds normal. No respiratory distress. He has no wheezes.  GI: Soft. Bowel sounds are normal. He exhibits no distension. There is no tenderness.  Musculoskeletal: He exhibits tenderness (Soreness when palpating in the left axillary region).  5/5 muscle strength in bilateral upper extremities  Neurological: He is alert.  Skin: He is not diaphoretic. No erythema.  Psychiatric: He has a normal mood and affect. His behavior is normal. Judgment and thought content normal.      Assessment & Plan:   See Encounters Tab for problem based charting.   Muscle Spasm Patient states that he has been having pain in his left axillary region starting Sunday.  The patient describes the pain as spasm-like in nature, 6/10 in intensity, intermittently present, lasting 5 to 10 minutes at each time, worsening when he puts his hand above his head.   Patient states that he is  used Tylenol which has alleviated the pain.  She states that he has not had any recent trauma to the region.  He has not had these symptoms before.  Assessment and plan The patient symptoms seem consistent with a muscle spasm.  He had some soreness when palpating in the left axillary region. He is less likely to have acute coronary syndrome due to the nature of his presentation.  -Prescribed Voltaren gel -Patient to apply heat to the area -Inform the patient that he should return to clinic or go to the ED if his pain worsens.  Patient seen with Dr. Dareen Piano

## 2017-08-08 NOTE — Assessment & Plan Note (Signed)
Patient states that he has been having pain in his left axillary region starting Sunday.  The patient describes the pain as spasm-like in nature, 6/10 in intensity, intermittently present, lasting 5 to 10 minutes at each time, worsening when he puts his hand above his head.   Patient states that he is used Tylenol which has alleviated the pain.  She states that he has not had any recent trauma to the region.  He has not had these symptoms before.  Assessment and plan The patient symptoms seem consistent with a muscle spasm.  He had some soreness when palpating in the left axillary region. He is less likely to have acute coronary syndrome due to the nature of his presentation.  -Prescribed Voltaren gel -Patient to apply heat to the area -Inform the patient that he should return to clinic or go to the ED if his pain worsens.

## 2017-08-08 NOTE — Patient Instructions (Signed)
It was a pleasure to see you today [name]. Please make the following changes:  Your pain is likely due to a muscle spasm. Please apply voltaren gel and heat to the affected area. Please return to clinic or go the ED if you feel that the pain is worsening.   If you have any questions or concerns, please call our clinic at [phone] between 9am-5pm and after hours call [phone] and ask for the internal medicine resident on call. If you feel you are having a medical emergency please call 911.   Thank you, we look forward to help you remain healthy!  Lars Mage, MD Internal Medicine PGY2

## 2017-08-09 ENCOUNTER — Telehealth: Payer: Self-pay | Admitting: *Deleted

## 2017-08-09 NOTE — Telephone Encounter (Addendum)
Information sent through CoverMyMeds for [place] for Voltaren Gel .  Awaiting decision within 72 hours.  Sander Nephew, RN 08/09/2017 9:19 AM. Call to [facility] spoke with Pharmacist Diclofenac Gel 1% was approved thru the duration of the patient's policy.  [facility] [phone]. was called and informed of.  Sander Nephew, RN 08/15/2017 2:03 PM

## 2017-08-09 NOTE — Progress Notes (Signed)
Internal Medicine Clinic Attending  Case discussed with Dr. Chundi at the time of the visit.  We reviewed the resident's history and exam and pertinent patient test results.  I agree with the assessment, diagnosis, and plan of care documented in the resident's note. 

## 2017-08-17 ENCOUNTER — Telehealth: Payer: Self-pay | Admitting: Internal Medicine

## 2017-08-17 NOTE — Telephone Encounter (Signed)
Phone Note:  Paged for call into [employer] on-call system at ~0241 hours on 08/17/2017.  Patient stated that when he closes his eyes for a few minutes or longer and then attempts to reopen either of them or both, that they feel as if they are "stuck" together. This has occurred since 08/13/2017. He described mild nonpurulent discharge in both eyes, denied "redness" of either eye overlying the white portion of his eye. He endorsed blurry vision initially upon opening his eyes that clears after blinking. He has tried warm compresses and his "allergy" eye drops (the name of which he could not recall) with moderate relief. He denied fever, chills, rhinorrhea, earache, nasal congestion or stuffiness, a sore throat, cold symptoms, facial rash or headache.   I advised the patient to continue his current therapy and to schedule an appointment in the clinic for late Friday if his symptoms worsen or fail to improve by then. IN addition he is to call the clinic in the morning or the on call resident if these symptoms worsen his vision becomes blurry and wont clear with blinking, develops redness to the conjunctiva or other concerning symptom. He agreed to continue conservative therapy.   Kathi Ludwig, MD PGY-2

## 2017-08-17 NOTE — Telephone Encounter (Signed)
   Reason for call:   I received a call from [name] at 5:30 PM indicating he is having crusting of his eyes.   Pertinent Data:   Patient called the on call pager this morning 3 am and described similar symptoms of, he was told to use a warm washcloth and eye drops. He has been using the warm washcloth and had resolution of his symptoms but called to check if the antihistamine eye drop that he purchased is safe.  He has some difficulties seeing first thing in the morning but this relieves when he blinks and the film is relieve from his eyes   The eye is not red or painful.    Assessment / Plan / Recommendations:   Encouraged to purchase preservative free saline eye drops. He is not having other symptoms of allergies at this time, this seems to be more consistent with infectious conjunctivitis.   Encouraged to seek emergency medical attention if he develops a painful red eye or is not able to see. He will call the clinic to schedule a follow up in clinic tomorrow if the eye is still uncomfortable   As always, pt is advised that if symptoms worsen or new symptoms arise, they should go to an urgent care facility or to to ER for further evaluation.   Dylan Noss, MD   08/17/2017, 5:33 PM

## 2017-08-23 ENCOUNTER — Other Ambulatory Visit: Payer: Self-pay | Admitting: Internal Medicine

## 2017-08-23 DIAGNOSIS — N400 Enlarged prostate without lower urinary tract symptoms: Secondary | ICD-10-CM

## 2017-08-23 DIAGNOSIS — R0789 Other chest pain: Secondary | ICD-10-CM

## 2017-08-23 NOTE — Telephone Encounter (Signed)
pls sch PCP appt Feb - June HTN F/U

## 2017-08-30 ENCOUNTER — Other Ambulatory Visit: Payer: Self-pay

## 2017-08-30 ENCOUNTER — Telehealth: Payer: Self-pay | Admitting: Internal Medicine

## 2017-08-30 ENCOUNTER — Ambulatory Visit (INDEPENDENT_AMBULATORY_CARE_PROVIDER_SITE_OTHER): Payer: BLUE CROSS/BLUE SHIELD | Admitting: Internal Medicine

## 2017-08-30 VITALS — BP 126/86 | HR 73 | Temp 98.5°F | Ht 67.0 in | Wt 200.8 lb

## 2017-08-30 DIAGNOSIS — H1011 Acute atopic conjunctivitis, right eye: Secondary | ICD-10-CM | POA: Diagnosis not present

## 2017-08-30 DIAGNOSIS — J302 Other seasonal allergic rhinitis: Secondary | ICD-10-CM

## 2017-08-30 DIAGNOSIS — J309 Allergic rhinitis, unspecified: Principal | ICD-10-CM

## 2017-08-30 MED ORDER — LORATADINE 10 MG PO TABS: 10.0000 mg | ORAL_TABLET | Freq: Every day | ORAL | 0 refills | Status: DC | PRN

## 2017-08-30 NOTE — Patient Instructions (Signed)
Dylan Lane,  It was a pleasure taking care of you at the clinic today.  The irritation of the eye is most likely due to allergies and inflammation of your lower eyelid.  There is my recommendation after today's visit:  1.  I am giving you a pill for allergies called Claritin.  You take 1 pill daily as needed for the itching. 2.  I recommend that you start using warm plain towel which she will apply to your right eye whenever you are having the symptom.  If the symptoms worsens or does not improve, give [place] a call and we will place a referral for you to see an ophthalmologist.  ~Dr. Eileen Stanford

## 2017-08-30 NOTE — Assessment & Plan Note (Signed)
Dylan Lane presents with a 2-week history of right eye pruitis, lacrimation that is worse at night and "crusty" eyes in the morning.  Since onset he has tried antihistamine eyedrops, saline eyedrops and warm towel compression with no apparent alleviation of symptoms.  He describes sensation of sand under his eyes which is consistent with blepharitis.  He denies fevers, rhinorrhea, chills, and only reports of irritation with intermittent snuffles.  Plan: - Trial of Claritin - Patient advised to use warm towel and apply to right eye 2-3 times a day. - He is to call the clinic if symptoms worsens.

## 2017-08-30 NOTE — Progress Notes (Signed)
   CC: Right itching   HPI:  Dylan Lane is a 67 y.o. gentleman with medical history significant for seasonal allergies presented for evaluation of right eye itching.  See problem based assessment for further details.  Allergic conjunctivitis: Dylan Lane presents with a 2-week history of right eye pruitis, lacrimation that is worse at night and "crusty" eyes in the morning.  Since onset he has tried antihistamine eyedrops, saline eyedrops and warm towel compression with no apparent alleviation of symptoms.  He describes sensation of sand under his eyes which is consistent with blepharitis.  He denies fevers, rhinorrhea, chills, and only reports of irritation with intermittent snuffles.  Plan: - Trial of Claritin - Patient advised to use warm towel and apply to right eye 2-3 times a day. - He is to call the clinic if symptoms worsens.  Past Medical History:  Diagnosis Date  . Colon polyps    3/06 and 2009:needs repeat in 3 yrs(3/12)  . GERD (gastroesophageal reflux disease) 2017  . Hyperlipidemia   . Hypertension   . Pre-diabetes    with fasting glucose of 110(2/09)  . Rectal bleeding    History of   Review of Systems: As per HPI  Physical Exam:  Vitals:   08/30/17 1604  BP: 126/86  Pulse: 73  Temp: 98.5 F (36.9 C)  TempSrc: Oral  SpO2: 100%  Weight: 200 lb 12.8 oz (91.1 kg)  Height: 5\' 7"  (1.702 m)   Physical Exam  Constitutional: He is well-developed, well-nourished, and in no distress.  HENT:  Head: Normocephalic and atraumatic.  Eyes: Pupils are equal, round, and reactive to light. Lids are normal. Lids are everted and swept, no foreign bodies found. Right eye exhibits no chemosis, no discharge, no exudate and no hordeolum. No foreign body present in the right eye. Left eye exhibits no chemosis, no discharge, no exudate and no hordeolum. No foreign body present in the left eye. Right conjunctiva is not injected. Right conjunctiva has no hemorrhage. No scleral  icterus. Right eye exhibits normal extraocular motion.  Mildly inflamed lower eyelid-R. eye  Cardiovascular: Normal rate, regular rhythm and normal heart sounds.  Pulmonary/Chest: Effort normal and breath sounds normal.  Abdominal: Soft. Bowel sounds are normal.    Assessment & Plan:   See Encounters Tab for problem based charting.  Patient discussed with Dr. Rebeca Alert

## 2017-08-30 NOTE — Telephone Encounter (Signed)
Pt called and would like advice about his eyes.  Pt states his  Right eye become "crusty and runny at night time only" x 1 week.  Please call pt back even though he made an appt for [facility] today @ 3:45pm.

## 2017-08-30 NOTE — Telephone Encounter (Signed)
Returned call to patient. He reiterated symptoms as below. He will come to [facility] appt today at 3:45 to have this assessed. Hubbard Hartshorn, RN, BSN

## 2017-08-30 NOTE — Telephone Encounter (Signed)
Agree. Thanks

## 2017-08-31 ENCOUNTER — Other Ambulatory Visit: Payer: Self-pay | Admitting: Internal Medicine

## 2017-08-31 ENCOUNTER — Telehealth: Payer: Self-pay | Admitting: Internal Medicine

## 2017-08-31 NOTE — Progress Notes (Signed)
Internal Medicine Clinic Attending  I saw and evaluated the patient.  I personally confirmed the key portions of the history and exam documented by Dr. Eileen Stanford and I reviewed pertinent patient test results.  The assessment, diagnosis, and plan were formulated together and I agree with the documentation in the resident's note.  On my exam, mildly injected right conjunctiva with evidence of blepharitis, no stye. With history of allergic rhinitis, reasonable to add loratadine, continue warm compresses (increase to 2-3x/day), lid hygiene, artificial tears. If not responding, would add topical antibiotic or refer to ophthalmology.   Lenice Pressman, M.D., Ph.D.

## 2017-09-05 NOTE — Telephone Encounter (Signed)
   Reason for call:   I received a call from Mr. Nylen Creque at 6 pm indicating he would like to discuss his prostate medications.   Pertinent Data:   Mr. Branscome reports a ~1 year history (unsure exactly) of sensation of incomplete bladder emptying and urinary urgency. He feels his activities of daily living are limited due to urinary urgency as he's worried he will have an "accident." Symptoms have not increased recently and denied any dysuria, hematuria, fevers, chills or pelvic pain. He wonders if the Tamsulosin could be responsible and would like to discuss medications that could help.    Assessment / Plan / Recommendations:   Patient with stable chronic BPH and urinary urgency without dysuria or systemic symptoms to suggest infection. He is frustrated mostly over urgency and inability to travel due to this.   Empathized with patient about his frustration. Discussed he should be seen in [facility] within the next few days to check his urine for infection and to discuss current medication regimen and possibly addition of medications for bladder spasm.   He was very appreciative and reports he will call clinic tomorrow morning to schedule an appointment.  As always, pt is advised that if symptoms worsen or new symptoms arise, they should go to an urgent care facility or to to ER for further evaluation.   Faizah Kandler, DO   09/05/2017, 6:18 PM

## 2017-09-07 ENCOUNTER — Other Ambulatory Visit: Payer: Self-pay

## 2017-09-07 ENCOUNTER — Encounter: Payer: Self-pay | Admitting: Internal Medicine

## 2017-09-07 ENCOUNTER — Ambulatory Visit (INDEPENDENT_AMBULATORY_CARE_PROVIDER_SITE_OTHER): Payer: BLUE CROSS/BLUE SHIELD | Admitting: Internal Medicine

## 2017-09-07 VITALS — BP 138/86 | HR 89 | Temp 98.6°F | Wt 208.3 lb

## 2017-09-07 DIAGNOSIS — Z79899 Other long term (current) drug therapy: Secondary | ICD-10-CM | POA: Diagnosis not present

## 2017-09-07 DIAGNOSIS — R3915 Urgency of urination: Secondary | ICD-10-CM | POA: Diagnosis not present

## 2017-09-07 DIAGNOSIS — Z87438 Personal history of other diseases of male genital organs: Secondary | ICD-10-CM

## 2017-09-07 DIAGNOSIS — R350 Frequency of micturition: Secondary | ICD-10-CM

## 2017-09-07 LAB — POCT URINALYSIS DIPSTICK
BILIRUBIN UA: NEGATIVE
Blood, UA: NEGATIVE
Glucose, UA: NEGATIVE
KETONES UA: NEGATIVE
Leukocytes, UA: NEGATIVE
Nitrite, UA: NEGATIVE
Protein, UA: NEGATIVE
Spec Grav, UA: 1.02 (ref 1.010–1.025)
Urobilinogen, UA: 1 E.U./dL
pH, UA: 7 (ref 5.0–8.0)

## 2017-09-07 NOTE — Progress Notes (Signed)
   CC: Urinary urgencyand frequency.    HPI:  [name] is a 67 y.o. M with PMH as listed below, presented with worsening of urinary urgency and frequency. Please see A & P for more details.  Past Medical History:  Diagnosis Date  . Colon polyps    3/06 and 2009:needs repeat in 3 yrs(3/12)  . GERD (gastroesophageal reflux disease) 2017  . Hyperlipidemia   . Hypertension   . Pre-diabetes    with fasting glucose of 110(2/09)  . Rectal bleeding    History of   Review of Systems:  Review of Systems  Constitutional: Negative for chills and fever.  Respiratory: Negative for cough, hemoptysis and shortness of breath.   Cardiovascular: Negative for chest pain, orthopnea and leg swelling.  Genitourinary: Positive for frequency and urgency. Negative for dysuria, flank pain and hematuria.      Vitals:   09/07/17 1536  BP: 138/86  Pulse: 89  Temp: 98.6 F (37 C)  TempSrc: Oral  SpO2: 99%  Weight: 208 lb 4.8 oz (94.5 kg)   Physical Exam: Physical Exam  Constitutional: He is oriented to person, place, and time. He appears well-developed and well-nourished.  HENT:  Head: Normocephalic and atraumatic.  Cardiovascular: Normal rate, regular rhythm and normal heart sounds.  No murmur heard. Pulmonary/Chest: Effort normal and breath sounds normal. No respiratory distress. He has no wheezes. He has no rales.  Abdominal: Soft. Bowel sounds are normal. He exhibits no distension. There is no tenderness. There is no rebound and no guarding.  Neurological: He is alert and oriented to person, place, and time.  Skin: Skin is warm and dry.  Psychiatric: He has a normal mood and affect.    Assessment & Plan:  1-Urinary urgency and frequency Has Hx of BPH. He reports his symptoms has gotten worse at the point that he can not stay standi long and it interferes his daily activity. Denies dysuria, hematuria or fever.   -U/A --> No WBC, No blood,  -Bladder U/S performed at clinic --> Nl  Prostate size of 2.8x3x3 cm with estimated volume of 23-25 cc. Bladder residual volume of 200 cc. -Urology refferal, considering fot urodynamic test if needed  See Encounters Tab for problem based charting.  Patient seen with Dr. Evette Doffing

## 2017-09-07 NOTE — Patient Instructions (Addendum)
It was a pleasure taking care of you in the clinic today.  You were seen due to urinary urgency and frequency.  Your urine test did not show any infection. We performed bladder ultrasound today that showed a normal size prostate. We refer you to a urologist for further work-up. Please let us know you have any question or concern and come back to clinic for follow up.  Thanks Dr. Linna Hoff

## 2017-09-14 ENCOUNTER — Telehealth: Payer: Self-pay

## 2017-09-14 NOTE — Telephone Encounter (Signed)
Requesting to speak with a nurse about OTC med. Please call pt back.

## 2017-09-14 NOTE — Telephone Encounter (Signed)
Returned call to patient. States he was told by nurse at Urologist's office to try AZO OTC to decrease urinary urgency symptoms. Wants to know if this is safe for him to take. Will route to PCP. Hubbard Hartshorn, RN, BSN

## 2017-09-15 NOTE — Telephone Encounter (Signed)
Thank you for your message. I called the patient and talked to him. He has an appointment with urologist and has been told by nurse to take PRN. I told him, that AZO it mostly helps with pain (dysuria) and burning relief. May have limited effect for urgency. How ever per his wishes, he can try that. No interaction with his current medications. He does not have kidney disease. Informed about possible urine discoloration due to AZO. Recommended to not overuse that. Also he mentioned that he has stopped Tamsulosin before due to ocular effect I recommended to see the urologist as scheduled soon.   Thanks

## 2017-09-17 ENCOUNTER — Telehealth: Payer: Self-pay | Admitting: Internal Medicine

## 2017-09-17 NOTE — Telephone Encounter (Signed)
Received a page to call back [name]. He called with concerns regarding his ongoing overactive bladder and urinary frequency. Specifically regarding it limiting his ability to be active in church due to frequent bathroom trips and fear of incontinence. He states he has tried pads today.  He was seen in clinic on 9/5 for this and his prostate was shown to be normal and he was given a referral to urology. He states he has made an appointment with urology for the 25th of this month.  He was instructed to keep his current appointment and try adult diapers in the interim as this may give him more security about any possible leakage showing.  Pearson Grippe, DO IM PGY- Pager: [phone]

## 2017-09-18 NOTE — Progress Notes (Signed)
Internal Medicine Clinic Attending  I saw and evaluated the patient.  I personally confirmed the key portions of the history and exam documented by Dr. Myrtie Hawk and I reviewed pertinent patient test results.  The assessment, diagnosis, and plan were formulated together and I agree with the documentation in the resident's note.  Patient with lower urinary tract symptoms that are bothersome to his everyday activities. PSA normal recently. Already on Flomax. UA ruled out infection. On exam his prostate is not very enlarged, though there is a moderate degree of intravesicular prostatic protrusion which may be leading to these symptoms. No history of urologic procedure. We talked about trying finasteride, but he is rightly worried about side effects and I am less confident about benefit given near normal size of his prostate. Will refer to urology to consider TR ultrasound or urodynamic testing to help guide further therapies.

## 2017-11-01 ENCOUNTER — Other Ambulatory Visit: Payer: Self-pay

## 2017-11-01 NOTE — Telephone Encounter (Signed)
Question about meds. Please call pt back.

## 2017-11-01 NOTE — Telephone Encounter (Signed)
Returned call to patient, no answer.  Left message to call RN back.  SChaplin,RN   

## 2017-11-03 ENCOUNTER — Encounter (INDEPENDENT_AMBULATORY_CARE_PROVIDER_SITE_OTHER): Payer: Self-pay

## 2017-11-03 ENCOUNTER — Telehealth: Payer: Self-pay | Admitting: Internal Medicine

## 2017-11-03 ENCOUNTER — Ambulatory Visit (INDEPENDENT_AMBULATORY_CARE_PROVIDER_SITE_OTHER): Payer: Medicare HMO | Admitting: Internal Medicine

## 2017-11-03 ENCOUNTER — Encounter: Payer: Self-pay | Admitting: Internal Medicine

## 2017-11-03 VITALS — BP 136/88 | HR 91 | Temp 98.1°F | Wt 201.8 lb

## 2017-11-03 DIAGNOSIS — I10 Essential (primary) hypertension: Secondary | ICD-10-CM | POA: Diagnosis not present

## 2017-11-03 DIAGNOSIS — Z79899 Other long term (current) drug therapy: Secondary | ICD-10-CM | POA: Diagnosis not present

## 2017-11-03 NOTE — Telephone Encounter (Signed)
Thank you. I agree 

## 2017-11-03 NOTE — Telephone Encounter (Signed)
BP 162/120 pls call pt [phone]

## 2017-11-03 NOTE — Telephone Encounter (Signed)
rtc to pt, he denies chest pain, short of breath, weakness, N&V, h/a, changes in vision, changes in speech. States urologist gave him new medicine and told him it may have side effects. Ask him to call 911 if any above start. Ask him to bring meds to appt 11/1 at [address] [facility]

## 2017-11-03 NOTE — Assessment & Plan Note (Signed)
Dylan Lane presents for HTN follow up. He has been well controlled on amlodipine 10 mg QD. He had one reading of 162/120 earlier today at home and was concerned this was caused by silodosin (alpha 1 blocker), which was recently prescribed by his urologist. Today his BP 138/88. He is asymptomatic. He reports adequate technique when talking his BP at home. I asked to him to take his BP twice a day for the next and come back for follow up in 1 week. He was advised to bring all his medication and his BP machine.   - Follow up in 1 week. Asked him to bring his medications and his BP machine  - Advise to continue silodosin as this should not caused increase in BP

## 2017-11-03 NOTE — Progress Notes (Signed)
   CC: HTN follow up   HPI:  Mr.Armel Hinnenkamp is a 67 y.o. year-old male with PMH listed below who presents to clinic for HTN follow up. Please see problem based assessment and plan for further details.   Past Medical History:  Diagnosis Date  . Colon polyps    3/06 and 2009:needs repeat in 3 yrs(3/12)  . GERD (gastroesophageal reflux disease) 2017  . Hyperlipidemia   . Hypertension   . Pre-diabetes    with fasting glucose of 110(2/09)  . Rectal bleeding    History of   Review of Systems:   Review of Systems  Constitutional: Negative for chills, fever and malaise/fatigue.  Respiratory: Negative for shortness of breath.   Cardiovascular: Negative for chest pain, palpitations and leg swelling.  Gastrointestinal: Negative for abdominal pain.  Neurological: Negative for dizziness and headaches.    Physical Exam:  Vitals:   11/03/17 1456  BP: 136/88  Pulse: 91  Temp: 98.1 F (36.7 C)  TempSrc: Oral  SpO2: 99%  Weight: 201 lb 12.8 oz (91.5 kg)    General: well-appearing male, obese, in no acute distress  Cardiac: regular rate and rhythm, nl S1/S2, no murmurs, rubs or gallops, no JVD  Pulm: CTAB, no wheezes or crackles, no increased work of breathing on room air  Ext: warm and well perfused, no peripheral edema, 2+ DP pulses bilaterally    Assessment & Plan:   See Encounters Tab for problem based charting.  Patient discussed with Dr. Dareen Piano

## 2017-11-03 NOTE — Patient Instructions (Addendum)
Dylan Lane,   Please take your blood pressure two times a day for the next week and write them down. Make a follow up appointment with [place] in 1 week and bring all this information with you. Bring your medications to this appointment. Also bring your blood pressure machine so we can check it.   Please call us if you have any questions or concerns in the meantime.   - Dr. Frederico Hamman

## 2017-11-09 ENCOUNTER — Encounter: Payer: Self-pay | Admitting: Oncology

## 2017-11-09 ENCOUNTER — Telehealth: Payer: Self-pay | Admitting: *Deleted

## 2017-11-09 NOTE — Telephone Encounter (Signed)
Pt was contacted by front office to inform him of an appt scheduled for tomorrow 11/8 @ 15:15 in [facility].  Pt states he has questions about silodosin and asks to speak to a male physician.  Will have attending MD give pt a call at number listed on chart.Despina Hidden Cassady11/7/20192:59 PM

## 2017-11-09 NOTE — Telephone Encounter (Signed)
I spoke w pt: I put note in chart.

## 2017-11-09 NOTE — Progress Notes (Signed)
Phone conversation with the patient. He is using Viagra.  He is only getting a partial erection.  He is not able to ejaculate.  He does have a scheduled appointment here tomorrow.  We can discuss this further but it sounds like he is not getting the beneficial effects of the medication but having some adverse effects.  I told him that we would likely discontinue the Viagra and look for an alternative for him.  He does have BPH.  He has seen a urologist in town.  I would encourage him to set up an appointment with urology since they have more expertise and may be able to offer him an alternative treatment.

## 2017-11-10 ENCOUNTER — Ambulatory Visit: Payer: BLUE CROSS/BLUE SHIELD

## 2017-11-13 NOTE — Progress Notes (Signed)
Internal Medicine Clinic Attending  Case discussed with Dr. Santos-Sanchez at the time of the visit.  We reviewed the resident's history and exam and pertinent patient test results.  I agree with the assessment, diagnosis, and plan of care documented in the resident's note.    

## 2017-11-16 ENCOUNTER — Telehealth: Payer: Self-pay | Admitting: Internal Medicine

## 2017-12-28 ENCOUNTER — Telehealth: Payer: Self-pay | Admitting: Internal Medicine

## 2017-12-28 NOTE — Telephone Encounter (Signed)
   Reason for call:   I received a call from [name] at 7:30 PM asking what the symptoms of pneumonia are.   Pertinent Data:   Patient clarifies to state that he has had some soreness of his bilateral back and wanted to make sure he was not having any symptoms of pneumonia.  He was informed that people will often experience fevers, shortnss of breath, and fatigue. He denies any of these symptoms and states he has been working out for the past few days, which he did not due previously and that his soreness is likely due to this.   Assessment / Plan / Recommendations:   Patient likely experiencing muscle soreness due to new workout regimen. No concerning symptoms at this time. advised to rest sore muscle groups and stay well hydrated.  As always, pt is advised that if symptoms worsen or new symptoms arise, they should go to an urgent care facility or to to ER for further evaluation.   Dylan Seat, MD   12/28/2017, 7:37 PM

## 2018-01-15 DIAGNOSIS — N401 Enlarged prostate with lower urinary tract symptoms: Secondary | ICD-10-CM | POA: Diagnosis not present

## 2018-01-15 DIAGNOSIS — N500 Atrophy of testis: Secondary | ICD-10-CM | POA: Diagnosis not present

## 2018-01-15 DIAGNOSIS — N3941 Urge incontinence: Secondary | ICD-10-CM | POA: Diagnosis not present

## 2018-02-01 ENCOUNTER — Encounter: Payer: Medicare HMO | Admitting: Internal Medicine

## 2018-02-02 ENCOUNTER — Telehealth: Payer: Self-pay | Admitting: Internal Medicine

## 2018-02-02 NOTE — Telephone Encounter (Signed)
Patient contacted and stated he had not called back since we last spoke. This was a duplicate page from when he fist called. See previous telephone note.  Pearson Grippe, DO IM PGY-2

## 2018-02-02 NOTE — Telephone Encounter (Signed)
   Reason for call:   I received a call from [name] at 4:38 AM indicating he wanted to ask a question.   Pertinent Data:   He wanted to ask if 164/109 is a high blood pressure. He was informed that this is above goal for him, but is not dangerously high if he is not having any symptoms  He denies vision changes, chest pain, shortness of breath, dizziness, or any other symptoms he can think of.   Assessment / Plan / Recommendations:   Patient advised to keep PCP appointment on 02/08/2018 and have his blood pressure addressed at that time  Pt is advised that if  symptoms arise, he should contact a medical professional   Dylan Seat, MD   02/02/2018, 4:42 AM

## 2018-02-08 ENCOUNTER — Encounter: Payer: Medicare HMO | Admitting: Internal Medicine

## 2018-03-08 ENCOUNTER — Encounter: Payer: Medicare HMO | Admitting: Internal Medicine

## 2018-03-20 ENCOUNTER — Telehealth: Payer: Self-pay | Admitting: Internal Medicine

## 2018-03-20 NOTE — Telephone Encounter (Signed)
   Reason for call:   I received a call from Mr. Dravon Nott at 10:10 PM indicating cough.   Pertinent Data:   Patient called due to new cough with associated runny nose for 2 days, he is seeking advice on treatment and whether or not he should be tested for COVID-19  He has a history of chronic cough treated with PPI, but he is not taking this consistently and the runny nose is different from that cough  He denies fevers, chills, SOB, or body aches  He denies any recent travel. He works at a [employer], no indication that his clients have traveled or been exposed to the virus   Assessment / Plan / Recommendations:   Patient to take OTC cough/cold medication that works for him  He is advised to call back if her develops fever, sob, worsening symptoms, or learns of any exposed contacts  As always, pt is advised that if symptoms worsen or new symptoms arise, they should go to an urgent care facility or to to ER for further evaluation.   Neva Seat, MD   03/20/2018, 10:21 PM

## 2018-04-26 ENCOUNTER — Telehealth: Payer: Self-pay | Admitting: *Deleted

## 2018-04-26 ENCOUNTER — Telehealth: Payer: Self-pay | Admitting: Internal Medicine

## 2018-04-26 NOTE — Telephone Encounter (Signed)
Reassured after asking to discuss COVID19

## 2018-04-26 NOTE — Telephone Encounter (Signed)
   Reason for call:   I received a call from [name] at 8:30 AM indicating that he wanted a check up because he has not been to his doctor in a while.   Pertinent Data:   Patient did not voice any complaints initially when asked if there was a specific issue he wanted to address. He later endorsed chronic morning fatigue. He denied fever, chest pain, dyspnea, cough, recent travel, or COVID exposure.   He is 68 yo but does not appear to have any chronic cardiac or pulmonary illnesses.    Assessment / Plan / Recommendations:   Explained to patient we are currently limiting visits at [facility] due to the coronavirus pandemic, but offered telehealth appointment to discuss his fatigue which he was agreeable to. Will contact [place] to schedule visit.   As always, pt is advised that if symptoms worsen or new symptoms arise, they should go to an urgent care facility or to to ER for further evaluation.   Welford Roche, MD   04/26/2018, 8:48 AM

## 2018-05-17 ENCOUNTER — Other Ambulatory Visit: Payer: Self-pay

## 2018-05-17 ENCOUNTER — Ambulatory Visit (INDEPENDENT_AMBULATORY_CARE_PROVIDER_SITE_OTHER): Payer: Medicare HMO | Admitting: Internal Medicine

## 2018-05-17 DIAGNOSIS — I10 Essential (primary) hypertension: Secondary | ICD-10-CM | POA: Diagnosis not present

## 2018-05-17 DIAGNOSIS — K219 Gastro-esophageal reflux disease without esophagitis: Secondary | ICD-10-CM | POA: Diagnosis not present

## 2018-05-17 DIAGNOSIS — Z79899 Other long term (current) drug therapy: Secondary | ICD-10-CM

## 2018-05-17 NOTE — Assessment & Plan Note (Signed)
Symptoms are controled. Denies any heartburn.  -Continue Pantoprazole 40 mg QD

## 2018-05-17 NOTE — Assessment & Plan Note (Addendum)
(  Televisit) Dylan Lane reports compliance to Amlodipine 10 mg QD.  No chest pain, no dizziness. Endorses mild headache some times He has blood pressure device but has not used that for a while. I advised to check his BP at home particularly when develops headache, and call us if the number was elevated.   -Continue Amlodipine 10 mg QD -Record BP readings and bring the records to the clinic on next visit -F/u in clinic in 3 months or sooner as needed

## 2018-05-17 NOTE — Progress Notes (Signed)
  [facility] Telephone Encounter Continuity Care Appointment  HPI:   This telephone encounter was created for [name] on 05/17/2018 for the following purpose/cc HTN follow up.   Past Medical History:  Past Medical History:  Diagnosis Date  . Colon polyps    3/06 and 2009:needs repeat in 3 yrs(3/12)  . GERD (gastroesophageal reflux disease) 2017  . Hyperlipidemia   . Hypertension   . Pre-diabetes    with fasting glucose of 110(2/09)  . Rectal bleeding    History of      ROS:      Assessment / Plan / Recommendations:   Please see A&P under problem oriented charting for assessment of the patient's acute and chronic medical conditions.   As always, pt is advised that if symptoms worsen or new symptoms arise, they should go to an urgent care facility or to to ER for further evaluation.   Consent and Medical Decision Making:   Patient discussed with Dr. Daryll Drown  This is a telephone encounter between Dylan Lane and [facility] on 05/17/2018 for HTN follow up. The visit was conducted with the patient located at home and [facility]. The patient's identity was confirmed using their DOB and current address. The patient has consented to being evaluated through a telephone encounter and understands the associated risks (an examination cannot be done and the patient may need to come in for an appointment) / benefits (allows the patient to remain at home, decreasing exposure to coronavirus). I personally spent 15 minutes on medical discussion.

## 2018-05-21 NOTE — Progress Notes (Signed)
Internal Medicine Clinic Attending  Case discussed with Dr. Myrtie Hawk at the time of the visit.  We reviewed the resident's history, telephone conversation and pertinent patient test results.  I agree with the assessment, diagnosis, and plan of care documented in the resident's note.

## 2018-07-16 ENCOUNTER — Encounter: Payer: Self-pay | Admitting: Internal Medicine

## 2018-07-16 ENCOUNTER — Ambulatory Visit (INDEPENDENT_AMBULATORY_CARE_PROVIDER_SITE_OTHER): Payer: Medicare HMO | Admitting: Internal Medicine

## 2018-07-16 ENCOUNTER — Other Ambulatory Visit: Payer: Self-pay

## 2018-07-16 VITALS — BP 141/85 | HR 67 | Temp 97.9°F | Ht 67.0 in | Wt 210.4 lb

## 2018-07-16 DIAGNOSIS — N3941 Urge incontinence: Secondary | ICD-10-CM | POA: Diagnosis not present

## 2018-07-16 DIAGNOSIS — R51 Headache: Secondary | ICD-10-CM

## 2018-07-16 DIAGNOSIS — D126 Benign neoplasm of colon, unspecified: Secondary | ICD-10-CM | POA: Diagnosis not present

## 2018-07-16 DIAGNOSIS — N400 Enlarged prostate without lower urinary tract symptoms: Secondary | ICD-10-CM

## 2018-07-16 DIAGNOSIS — I10 Essential (primary) hypertension: Secondary | ICD-10-CM

## 2018-07-16 DIAGNOSIS — R7303 Prediabetes: Secondary | ICD-10-CM

## 2018-07-16 DIAGNOSIS — Z79899 Other long term (current) drug therapy: Secondary | ICD-10-CM | POA: Diagnosis not present

## 2018-07-16 DIAGNOSIS — N401 Enlarged prostate with lower urinary tract symptoms: Secondary | ICD-10-CM

## 2018-07-16 LAB — GLUCOSE, CAPILLARY: Glucose-Capillary: 103 mg/dL — ABNORMAL HIGH (ref 70–99)

## 2018-07-16 LAB — POCT GLYCOSYLATED HEMOGLOBIN (HGB A1C): Hemoglobin A1C: 6.4 % — AB (ref 4.0–5.6)

## 2018-07-16 NOTE — Patient Instructions (Addendum)
It was our pleasure taking care of you in our clinic today.  You were seen for a routine check up. Your blood pressure was slightly elevated at 141/85. For now, continue taking Amlodipine 10 mg daily. We will recheck your blood pressure on next visit and decide about if you need another medications. Please bring your blood pressure machine to clinic to calibrate it.  I also do a blood test for diabetes and call you if your result will be abnormal. Keep follow up with your GI doctor and urologist as before. And see an optometrist for checking your glasses.  I did not make any changes in your medications today. Keep good job of taking your medications regularly. Please come back to clinic in 3 month or earlier as needed.  Thanks, Dr. Myrtie Hawk

## 2018-07-16 NOTE — Progress Notes (Signed)
   CC: .Hypertension follow up  HPI:  [name] is a 68 y.o. with PMHx as documented below, presented for routine check up and HTN follow up. Please refer to problem based charting for further details and assessment of plan of current problem and chronic medical conditions.  Past Medical History:  Diagnosis Date  . Colon polyps    3/06 and 2009:needs repeat in 3 yrs(3/12)  . GERD (gastroesophageal reflux disease) 2017  . Hyperlipidemia   . Hypertension   . Pre-diabetes    with fasting glucose of 110(2/09)  . Rectal bleeding    History of   Social Hx: Lives with his partner in [place]. Denies smoking, no illicit drug use. No alcohol use. Family Hx: Mother with dementia. Father with DM and gout  Review of Systems: Review of Systems  Constitutional: Negative for chills, fever and weight loss.  Respiratory: Negative for cough.   Cardiovascular: Negative for chest pain and leg swelling.  Gastrointestinal: Negative for abdominal pain, blood in stool, constipation, diarrhea, nausea and vomiting.  Genitourinary: Negative for dysuria, frequency, hematuria and urgency.  Neurological: Positive for headaches. Negative for dizziness, speech change and focal weakness.    Physical Exam:  Vitals:   07/16/18 0855  BP: (!) 141/85  Pulse: 67  Temp: 97.9 F (36.6 C)  TempSrc: Oral  SpO2: 99%  Weight: 210 lb 6.4 oz (95.4 kg)  Height: 5\' 7"  (1.702 m)   Physical Exam  Constitutional: He is oriented to person, place, and time and well-developed, well-nourished, and in no distress. No distress.  HENT:  Head: Normocephalic and atraumatic.  Eyes: EOM are normal.  Cardiovascular: Normal rate and regular rhythm.  No murmur heard. Pulmonary/Chest: Effort normal and breath sounds normal. He has no wheezes. He has no rales.  Abdominal: Soft. Bowel sounds are normal. He exhibits no distension. There is no abdominal tenderness.  Neurological: He is alert and oriented to person, place,  and time.  Skin: Skin is warm and dry.  Psychiatric: Memory, affect and judgment normal.    Assessment & Plan:   See Encounters Tab for problem based charting.  Patient discussed with Dr. Dareen Piano

## 2018-07-16 NOTE — Assessment & Plan Note (Signed)
Denies any GI symptoms. No rectal bleeding. No weight loss. Reports Colonoscopy was done at 2018  that was benign. No document about that in chart. Will request it from [facility]: Dr. Nelwyn Salisbury MD)

## 2018-07-16 NOTE — Assessment & Plan Note (Signed)
He follows up with Dr. Jeffie Pollock. Tamsulosine stopped and he is currently on Alfuzosin ER 10 mg QD. He mentions that his urge incontinency is much improved after switching the medication. Last PSA (10/2017) was 1.08 . Recommended to repeat it in a year by Dr. Jeffie Pollock. -Continue Alfuzosin 10 mg QD -Repeat PSA and UA in 3 months (follow up with urologist)

## 2018-07-16 NOTE — Assessment & Plan Note (Signed)
Checking HbA1c today. 

## 2018-07-16 NOTE — Assessment & Plan Note (Addendum)
The patient's blood pressure during this visit was 141/85. The patient is currently taking amlodipine 10 mg daily.  His last blood pressure visits are  BP Readings from Last 3 Encounters:  11/03/17 136/88  09/07/17 138/86  08/30/17 126/86   Patient reports some occasional mild headache 1-3/10.  He did not check his blood pressure at home because he is not sure if he is calibrated. The patient does not report palpitations, dizziness, chest pain, sob.  Plan: -Continue amlodipine 10 mg daily -Recommending to bring the home BP cuff  -F/u in clinic in 3 months

## 2018-07-17 NOTE — Progress Notes (Signed)
Internal Medicine Clinic Attending  Case discussed with Dr. Masoudi  at the time of the visit.  We reviewed the resident's history and exam and pertinent patient test results.  I agree with the assessment, diagnosis, and plan of care documented in the resident's note.  

## 2018-07-20 ENCOUNTER — Other Ambulatory Visit: Payer: Medicare HMO | Admitting: Internal Medicine

## 2018-07-20 ENCOUNTER — Telehealth: Payer: Self-pay | Admitting: Internal Medicine

## 2018-07-20 DIAGNOSIS — Z7189 Other specified counseling: Secondary | ICD-10-CM

## 2018-07-20 NOTE — Assessment & Plan Note (Signed)
(  Televisit)  Mr. Eggebrecht is concern about having indirect COVID-19 exposure. His partner was in contact with a lady whose son has been diagnosed with COVID-19. (The lady has been tested but the test is pending).  Mr. Favorite and his partner are both asymptomatic and are doing well but are concern and would like to be tested.  I provided reassurance since he is asymptomatic and did not have an actual exposure. But offered outpatient test per his preference.  -I provided Mr. Coppa with [place] COVID-19 test center phone number and he will contact them to get an appointment. (I contacted the test center personally and made sure he does not need a referral.) -I encouraged him to use mask and practice social distancing.  -Advised to come to clinic or ED if he or his partner developed any symptoms.

## 2018-07-20 NOTE — Telephone Encounter (Signed)
Called pt - no answer; left message to give us a call back. 

## 2018-07-20 NOTE — Telephone Encounter (Signed)
   Reason for call:   I received a call from [name] at 4:45 PM indicating he wanted to talk more about some issues that he did not talk about in his recent clinic visit; specifically his leg swelling.   Pertinent Data:   He states that he has had leg swelling since his clinic visit   He reports chronic waxing and waning LE swelling  Current swelling is below the calf with right LE swelling worsen than left LE swelling  The swelling has worsened since his clinic visit on 7/13  The swelling improves with elevation but does not resolve  He has some associate pain in his R achillis tendon, but not his calf  He has tried ibuprofen for calf pain with some relief   Assessment / Plan / Recommendations:   Patient symptoms consistent with possible worsening of edema due to venous insufficiency given improvement with elevation and chronicity. Recommend continued elevation and use of compression stocking, which he states he had gotten recently. Low suspicion for DVT at this time, patient instructed to seek medical attention if significant LE swelling, uneven swelling or pain. He is to call to schedule a clinic appointment if he is not doing better by Monday.   For pain, suspect possible tendonitis vs MSK pain of his ankle. Recommend continued NSAID therapy for the next few days and to call us back if his pain does not improve to schedule a follow up.   As always, pt is advised that if symptoms worsen or new symptoms arise, they should go to an urgent care facility or to to ER for further evaluation.   Dylan Seat, MD   07/20/2018, 4:55 PM

## 2018-07-20 NOTE — Telephone Encounter (Signed)
Pt requesting a call from physician nurse [phone]

## 2018-07-20 NOTE — Telephone Encounter (Signed)
Please have Dr. Myrtie Hawk call him to see if he needs to be tested. Thank you

## 2018-07-20 NOTE — Telephone Encounter (Signed)
Telehealth visit scheduled today - see Dr Darcey Nora telphone encounter.

## 2018-07-20 NOTE — Telephone Encounter (Signed)
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 Pt denies any symptoms- cp, sob, fever, loss of appetite,cough. Also stated his partner is going to be tested. He wants to know if he needs to be tested/ Instructed pt to quarantine himself until we call him back. Verbalized understanding.

## 2018-07-20 NOTE — Progress Notes (Signed)
Internal Medicine Clinic Attending  Case discussed with Dr. Masoudi  at the time of the visit.  We reviewed the resident's history and exam and pertinent patient test results.  I agree with the assessment, diagnosis, and plan of care documented in the resident's note.  

## 2018-07-20 NOTE — Progress Notes (Signed)
  [facility] Telephone Encounter Continuity Care Appointment  HPI:   This telephone encounter was created for [name] on 07/20/2018 for the following purpose/cc concern about indirect COVID-19 exposure.   Past Medical History:  Past Medical History:  Diagnosis Date  . Colon polyps    3/06 and 2009:needs repeat in 3 yrs(3/12)  . GERD (gastroesophageal reflux disease) 2017  . Hyperlipidemia   . Hypertension   . Pre-diabetes    with fasting glucose of 110(2/09)  . Rectal bleeding    History of      ROS:      Assessment / Plan / Recommendations:   Please see A&P under problem oriented charting for assessment of the patient's acute and chronic medical conditions.   As always, pt is advised that if symptoms worsen or new symptoms arise, they should go to an urgent care facility or to to ER for further evaluation.   Consent and Medical Decision Making:   Patient discussed with Dr. Dareen Piano  This is a telephone encounter between Dylan Lane and Dylan Lane on 07/20/2018 for concern about indirect COVID-19 exposure. The visit was conducted with the patient located at home and [facility] at [facility]. The patient's identity was confirmed using their DOB and current address. The patient has consented to being evaluated through a telephone encounter and understands the associated risks (an examination cannot be done and the patient may need to come in for an appointment) / benefits (allows the patient to remain at home, decreasing exposure to coronavirus). I personally spent 15 minutes on medical discussion.

## 2018-07-21 ENCOUNTER — Other Ambulatory Visit: Payer: Self-pay

## 2018-07-21 ENCOUNTER — Emergency Department (HOSPITAL_COMMUNITY): Payer: Medicare HMO

## 2018-07-21 ENCOUNTER — Encounter (HOSPITAL_COMMUNITY): Payer: Self-pay | Admitting: Emergency Medicine

## 2018-07-21 ENCOUNTER — Emergency Department (HOSPITAL_COMMUNITY)
Admission: EM | Admit: 2018-07-21 | Discharge: 2018-07-21 | Disposition: A | Discharge status: Home / Self Care | Payer: Medicare HMO | Attending: Emergency Medicine | Admitting: Emergency Medicine

## 2018-07-21 DIAGNOSIS — R2241 Localized swelling, mass and lump, right lower limb: Secondary | ICD-10-CM | POA: Diagnosis not present

## 2018-07-21 DIAGNOSIS — R17 Unspecified jaundice: Secondary | ICD-10-CM | POA: Diagnosis not present

## 2018-07-21 DIAGNOSIS — Z7982 Long term (current) use of aspirin: Secondary | ICD-10-CM | POA: Insufficient documentation

## 2018-07-21 DIAGNOSIS — M25471 Effusion, right ankle: Secondary | ICD-10-CM

## 2018-07-21 DIAGNOSIS — I10 Essential (primary) hypertension: Secondary | ICD-10-CM | POA: Diagnosis not present

## 2018-07-21 DIAGNOSIS — Z87891 Personal history of nicotine dependence: Secondary | ICD-10-CM | POA: Insufficient documentation

## 2018-07-21 DIAGNOSIS — M25571 Pain in right ankle and joints of right foot: Secondary | ICD-10-CM | POA: Insufficient documentation

## 2018-07-21 DIAGNOSIS — M7989 Other specified soft tissue disorders: Secondary | ICD-10-CM | POA: Diagnosis not present

## 2018-07-21 DIAGNOSIS — Z79899 Other long term (current) drug therapy: Secondary | ICD-10-CM | POA: Insufficient documentation

## 2018-07-21 DIAGNOSIS — N289 Disorder of kidney and ureter, unspecified: Secondary | ICD-10-CM

## 2018-07-21 LAB — CBC WITH DIFFERENTIAL/PLATELET
Abs Immature Granulocytes: 0.01 10*3/uL (ref 0.00–0.07)
Basophils Absolute: 0.1 10*3/uL (ref 0.0–0.1)
Basophils Relative: 1 %
Eosinophils Absolute: 0.2 10*3/uL (ref 0.0–0.5)
Eosinophils Relative: 2 %
HCT: 41.1 % (ref 39.0–52.0)
Hemoglobin: 14.4 g/dL (ref 13.0–17.0)
Immature Granulocytes: 0 %
Lymphocytes Relative: 33 %
Lymphs Abs: 2.3 10*3/uL (ref 0.7–4.0)
MCH: 28.7 pg (ref 26.0–34.0)
MCHC: 35 g/dL (ref 30.0–36.0)
MCV: 82 fL (ref 80.0–100.0)
Monocytes Absolute: 0.6 10*3/uL (ref 0.1–1.0)
Monocytes Relative: 9 %
Neutro Abs: 4 10*3/uL (ref 1.7–7.7)
Neutrophils Relative %: 55 %
Platelets: 231 10*3/uL (ref 150–400)
RBC: 5.01 MIL/uL (ref 4.22–5.81)
RDW: 13 % (ref 11.5–15.5)
WBC: 7.2 10*3/uL (ref 4.0–10.5)
nRBC: 0 % (ref 0.0–0.2)

## 2018-07-21 LAB — COMPREHENSIVE METABOLIC PANEL
ALT: 19 U/L (ref 0–44)
AST: 20 U/L (ref 15–41)
Albumin: 3.8 g/dL (ref 3.5–5.0)
Alkaline Phosphatase: 81 U/L (ref 38–126)
Anion gap: 7 (ref 5–15)
BUN: 11 mg/dL (ref 8–23)
CO2: 28 mmol/L (ref 22–32)
Calcium: 9.8 mg/dL (ref 8.9–10.3)
Chloride: 105 mmol/L (ref 98–111)
Creatinine, Ser: 1.35 mg/dL — ABNORMAL HIGH (ref 0.61–1.24)
GFR calc Af Amer: >60 mL/min (ref >60)
GFR calc non Af Amer: 55 mL/min — ABNORMAL LOW (ref >60)
Glucose, Bld: 114 mg/dL — ABNORMAL HIGH (ref 70–99)
Potassium: 4.5 mmol/L (ref 3.5–5.1)
Sodium: 140 mmol/L (ref 135–145)
Total Bilirubin: 1.4 mg/dL — ABNORMAL HIGH (ref 0.3–1.2)
Total Protein: 6.9 g/dL (ref 6.5–8.1)

## 2018-07-21 LAB — URIC ACID: Uric Acid, Serum: 5.9 mg/dL (ref 3.7–8.6)

## 2018-07-21 MED ORDER — IBUPROFEN 800 MG PO TABS
800.0000 mg | ORAL_TABLET | Freq: Once | ORAL | Status: AC
  Administered 2018-07-21: 800 mg via ORAL
  Filled 2018-07-21: qty 1

## 2018-07-21 MED ORDER — PREDNISONE 20 MG PO TABS: 60.0000 mg | ORAL_TABLET | Freq: Every day | ORAL | 0 refills | Status: DC

## 2018-07-21 MED ORDER — OXYCODONE-ACETAMINOPHEN 5-325 MG PO TABS: 1.0000 | ORAL_TABLET | ORAL | 0 refills | Status: DC | PRN

## 2018-07-21 MED ORDER — PREDNISONE 20 MG PO TABS
60.0000 mg | ORAL_TABLET | Freq: Once | ORAL | Status: AC
  Administered 2018-07-21: 60 mg via ORAL
  Filled 2018-07-21: qty 3

## 2018-07-21 NOTE — Discharge Instructions (Addendum)
Apply ice as needed
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
## 2018-07-21 NOTE — ED Triage Notes (Signed)
Pt reports R lower leg swelling x 3 days. Pt reports pain to lower leg above ankle, pain with bearing weight to R leg. Pt reports hx of HTN and high cholesterol

## 2018-07-25 ENCOUNTER — Telehealth: Payer: Self-pay

## 2018-07-25 NOTE — Telephone Encounter (Signed)
Requesting lab results. Please call pt back.  

## 2018-08-02 ENCOUNTER — Telehealth: Payer: Self-pay | Admitting: Internal Medicine

## 2018-08-02 NOTE — Telephone Encounter (Signed)
   Reason for call:   I received a call from Mr. Ricky Gallery at 07:30 PM indicating ankle pain.   Pertinent Data:   Mr.Wirtanen is a 68 yo M w/ PMH of GERD, HLD, HTN calling in regards to ankle pain. He states he was in his usual state of health until about 2 weeks ago when he started experiencing acute onset sharp pain that start at his right heel and radiates up to his calf. He mentions that he went to the ED on 07/21/18 for evaluation and was not told the results of his X-ray at that time. He mentions that he received prednisone and oxy at that visit and took the prednisone as prescribed with mild improvement but has been avoiding taking the oxy because he was worried about addiction. He states the pain is exacerbated by movement and weight-bearing and alleviated with rest. Denies any fevers, chills, nausea, vomiting, significant erythema or edema.    Assessment / Plan / Recommendations:   Mr.Aldape presents with complaint of ankle pain. On chart review, ED provider evaluated him for possible gout with negative work-up. Description of his ankle pain with radiation is consistent with achillis tendonitis flare. Discussed first line treatment options including rest, ice, elevation, compression and short course of ibuprofen therapy. Also recommended in-person evaluation at clinic in the morning. Mr.Pittman expressed understanding and requested to have [facility] clinic visit on Monday morning. Discussed red-flags over the weekend including worsening swelling, fevers, chills, and tracking up pain and urged him to go to ER if these symptoms occur.  As always, pt is advised that if symptoms worsen or new symptoms arise, they should go to an urgent care facility or to to ER for further evaluation.   Mosetta Anis, MD   08/02/2018, 7:35 PM

## 2018-08-07 ENCOUNTER — Ambulatory Visit: Payer: Medicare HMO

## 2018-08-17 ENCOUNTER — Other Ambulatory Visit: Payer: Self-pay | Admitting: Internal Medicine

## 2018-08-17 DIAGNOSIS — R0789 Other chest pain: Secondary | ICD-10-CM

## 2018-08-17 NOTE — Telephone Encounter (Signed)
Pt called after hours line, he is completely out of amlodipine, pravastatin and protonix.  He has no other complaints today.  Refilled these for him.

## 2018-08-17 NOTE — Telephone Encounter (Signed)
Refilled pravastatin see prior telephone encounter

## 2018-08-24 ENCOUNTER — Telehealth: Payer: Self-pay | Admitting: Internal Medicine

## 2018-08-24 NOTE — Telephone Encounter (Signed)
Paged by the patient asking what laxatives he could take. Told him he could take over-the-counter MiraLAX. All questions and concerns addressed.

## 2018-09-15 ENCOUNTER — Encounter: Payer: Self-pay | Admitting: Family Medicine

## 2018-09-15 ENCOUNTER — Telehealth: Payer: Self-pay | Admitting: Internal Medicine

## 2018-09-15 NOTE — Telephone Encounter (Signed)
   Reason for call:   I received a call from [name] at 9 PM PM indicating that he has been experiencing right thumb/wrist pain for the past day.  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  He does not report of any systemic findings.   Pertinent Data:   None   Assessment / Plan / Recommendations:   His right thumb/wrist pain could be due to overuse injury from recent increased in activities.  He tells me he already has Percocet 5-325 mg at home and I advised him to take 1 pill every 6-8 hours as needed for pain.  As always, pt is advised that if symptoms worsen or new symptoms arise, they should go to an urgent care facility or to to ER for further evaluation.   Jean Rosenthal, MD   09/15/2018, 9:03 PM

## 2018-09-17 NOTE — Telephone Encounter (Signed)
This encounter was created in error - please disregard.

## 2018-10-05 DIAGNOSIS — R69 Illness, unspecified: Secondary | ICD-10-CM | POA: Diagnosis not present

## 2018-11-13 ENCOUNTER — Other Ambulatory Visit: Payer: Self-pay | Admitting: Internal Medicine

## 2018-11-13 DIAGNOSIS — R0789 Other chest pain: Secondary | ICD-10-CM

## 2018-11-17 ENCOUNTER — Telehealth: Payer: Self-pay | Admitting: Internal Medicine

## 2018-11-17 NOTE — Telephone Encounter (Signed)
   Reason for call:   I received a call from [name] at 6:56 AM indicating that he has been experiencing intermittent right-sided lower back pain when he sleeps on his right side.  Describes the pain as "being punched in the back.  "The location of the pain is at the right buttock area.  He states the pain is worse when he drinks soda and recently he has not drank any soda for a while and that has helped with the pain.  He denies fevers, chills, nausea, vomiting, dysuria, urinary frequency, urinary urgency, chest pain or shortness of breath.    Pertinent Data:   Lumbosacral pain, BPH   Assessment / Plan / Recommendations:   Given the description of his pain worsening with movement of his body, it seems musculoskeletal in nature.  As always, pt is advised that if symptoms worsen or new symptoms arise, they should go to an urgent care facility or to to ER for further evaluation.   Jean Rosenthal, MD   11/17/2018, 9:07 AM

## 2018-12-05 ENCOUNTER — Telehealth: Payer: Self-pay | Admitting: Internal Medicine

## 2018-12-05 NOTE — Telephone Encounter (Signed)
Dylan Lane heel with heal pain.  Dylan Lane wants to know what to take until he appt tomorrow with Dr. Myrtie Hawk.  Please call the patient back.

## 2018-12-06 ENCOUNTER — Encounter: Payer: Self-pay | Admitting: Internal Medicine

## 2018-12-06 ENCOUNTER — Other Ambulatory Visit: Payer: Self-pay

## 2018-12-06 ENCOUNTER — Ambulatory Visit (INDEPENDENT_AMBULATORY_CARE_PROVIDER_SITE_OTHER): Payer: Medicare HMO | Admitting: Internal Medicine

## 2018-12-06 VITALS — BP 117/85 | HR 94 | Temp 98.4°F | Wt 212.2 lb

## 2018-12-06 DIAGNOSIS — R2241 Localized swelling, mass and lump, right lower limb: Secondary | ICD-10-CM | POA: Diagnosis not present

## 2018-12-06 DIAGNOSIS — Z0000 Encounter for general adult medical examination without abnormal findings: Secondary | ICD-10-CM

## 2018-12-06 DIAGNOSIS — Z23 Encounter for immunization: Secondary | ICD-10-CM | POA: Diagnosis not present

## 2018-12-06 MED ORDER — PREDNISONE 20 MG PO TABS: 40.0000 mg | ORAL_TABLET | Freq: Every day | ORAL | 0 refills | Status: AC

## 2018-12-06 NOTE — Patient Instructions (Signed)
I give you some steroid pill and topical gel that help with pain and inflammation/swelling.  Please use these as instructed.  I wrap your ankle with ace bandage for support. I refer you to podiatrist for further evaluation of foot pain.  Please come back to clinic if your symptoms got worse meanwhile. Thank you

## 2018-12-06 NOTE — Progress Notes (Signed)
   CC: Right ankle swelling and pain  HPI:  [name] is a 68 y.o. male with PMHx as documented below, presented with left ankle swelling  Please refer to problem based charting for further details and assessment of plan of current problem and chronic medical conditions.  Past Medical History:  Diagnosis Date  . Colon polyps    3/06 and 2009:needs repeat in 3 yrs(3/12)  . GERD (gastroesophageal reflux disease) 2017  . Hyperlipidemia   . Hypertension   . Pre-diabetes    with fasting glucose of 110(2/09)  . Rectal bleeding    History of   Review of Systems:  Review of Systems  Constitutional: Negative for chills and fever.  Gastrointestinal: Negative for abdominal pain, nausea and vomiting.  Musculoskeletal: Positive for joint pain. Negative for falls.     Physical Exam:  Vitals:   12/06/18 1433  BP: 117/85  Pulse: 94  Temp: 98.4 F (36.9 C)  TempSrc: Oral  SpO2: 97%  Weight: 212 lb 3.2 oz (96.3 kg)   Physical Exam  Constitutional: Well-developed and well-nourished. No acute distress.  HENT:  Head: Normocephalic and atraumatic.  Eyes: Conjunctivae are normal, EOM nl Ears:  Cardiovascular:  RRR, nl S1S2, no murmur,  no LEE Respiratory: Effort normal and breath sounds normal. No respiratory distress. No wheezes.  GI: Soft. Bowel sounds are normal. No distension. There is no tenderness.  Neurological: Is alert and oriented x 3  Skin: Not diaphoretic. No erythema.  Psychiatric: Normal mood and affect. Behavior is normal. Judgment and thought content normal.  MSK: No erythema, right ankle swelling at dorsomedial of midfoot, tenderness to palpation. Ankle ROM is nl,Rt ankle: Normal inspection, no tenderness to palpation.        Assessment & Plan:   See Encounters Tab for problem based charting.  Patient discussed with Dr. Daryll Lane

## 2018-12-10 ENCOUNTER — Encounter: Payer: Self-pay | Admitting: Internal Medicine

## 2018-12-10 DIAGNOSIS — R2241 Localized swelling, mass and lump, right lower limb: Secondary | ICD-10-CM | POA: Insufficient documentation

## 2018-12-10 NOTE — Assessment & Plan Note (Signed)
Pain, swelling and tenderness at dorsomedial aspect rt midfoot.  Tendinopaty of medial components, Peroneal tendonitis, tibialis anterior tendinitis, gout flare are on differential. How ever, no erythema and no actual joint pain or swelling is present.  Patient has had similar foot pain before. No evidence of Fx of prior X ray and no tenderness of bony structure of foot. Also no tenderness on arch. Will give short course of steroid and refer to podiatrist.  -Prednisone 40 mg QD -Ache bandage to support arch  -Voltaren gel  -Ambulatory referral to podiatrist.

## 2018-12-10 NOTE — Assessment & Plan Note (Signed)
Received flu vaccine today

## 2018-12-11 NOTE — Telephone Encounter (Signed)
Pt was seen 12/3 by Dr Myrtie Hawk.

## 2018-12-11 NOTE — Progress Notes (Signed)
Internal Medicine Clinic Attending  Case discussed with Dr. Masoudi  at the time of the visit.  We reviewed the resident's history and exam and pertinent patient test results.  I agree with the assessment, diagnosis, and plan of care documented in the resident's note.  

## 2018-12-12 NOTE — Addendum Note (Signed)
Addended by: Marcelino Duster on: 12/12/2018 04:20 PM   Modules accepted: Orders

## 2018-12-13 NOTE — Addendum Note (Signed)
Addended byDewayne Hatch on: 12/13/2018 11:53 AM   Modules accepted: Orders

## 2018-12-19 ENCOUNTER — Encounter: Payer: Self-pay | Admitting: *Deleted

## 2018-12-25 ENCOUNTER — Other Ambulatory Visit: Payer: Self-pay | Admitting: Internal Medicine

## 2018-12-25 ENCOUNTER — Telehealth: Payer: Self-pay | Admitting: Internal Medicine

## 2018-12-25 DIAGNOSIS — M545 Low back pain, unspecified: Secondary | ICD-10-CM

## 2018-12-25 DIAGNOSIS — R0789 Other chest pain: Secondary | ICD-10-CM

## 2018-12-25 MED ORDER — DICLOFENAC SODIUM 1 % EX GEL: 2.0000 g | Freq: Four times a day (QID) | CUTANEOUS | 0 refills | Status: DC

## 2018-12-25 NOTE — Telephone Encounter (Signed)
   Reason for call:   I received a call from [name] at 4:45 PM indicating low back pain. Please see assessment and plan for further details.         Assessment / Plan / Recommendations:   Low back pain x 1 week. No associated symptoms including no erythema, rash. No fever or chills. No evidence of radiculopathy and no red flag. Sent prescription for Voltaren gel and recommending to f/u in [facility] for examination tomorrow if no improvement.  As always, pt is advised that if symptoms worsen or new symptoms arise, they should go to an urgent care facility or to to ER for further evaluation.   Dewayne Hatch, MD   12/25/2018, 6:23 PM

## 2019-01-03 ENCOUNTER — Other Ambulatory Visit: Payer: Self-pay | Admitting: Internal Medicine

## 2019-01-03 DIAGNOSIS — K5900 Constipation, unspecified: Secondary | ICD-10-CM

## 2019-01-03 MED ORDER — SENNOSIDES-DOCUSATE SODIUM 8.6-50 MG PO TABS: 2.0000 | ORAL_TABLET | Freq: Every day | ORAL | 0 refills | Status: DC | PRN

## 2019-01-05 ENCOUNTER — Other Ambulatory Visit: Payer: Self-pay | Admitting: Internal Medicine

## 2019-01-05 ENCOUNTER — Telehealth: Payer: Self-pay | Admitting: Internal Medicine

## 2019-01-05 NOTE — Telephone Encounter (Signed)
   Reason for call:   I received a call from Mr. Marqual Crookston at 5:20 PM.  He was in his usual state of health until about a week ago when he began experiencing low back pain (left-sided) that gets worse in the evening with upstanding and improves after he sits down and puts a pillow behind his back.  Currently, he denies fevers, chills, urinary or bowel incontinence.  He also does not report of lifting heavy objects recently.   Pertinent Data:   Muscle spasm, low back pain   Assessment / Plan / Recommendations:   He has a known history of low back pain however his disease prescription seems to fit with possible spinal stenosis.  He does not have any red flag symptoms currently and I encouraged him to either present to the emergency department when his symptoms worsens or follow-up with [facility].  As always, pt is advised that if symptoms worsen or new symptoms arise, they should go to an urgent care facility or to to ER for further evaluation.   Jean Rosenthal, MD   01/05/2019, 5:28 PM

## 2019-01-16 NOTE — Addendum Note (Signed)
Addended by: Hulan Fray on: 01/16/2019 07:27 PM   Modules accepted: Orders

## 2019-01-19 ENCOUNTER — Telehealth: Payer: Self-pay | Admitting: Internal Medicine

## 2019-01-19 ENCOUNTER — Other Ambulatory Visit: Payer: Self-pay | Admitting: Internal Medicine

## 2019-01-19 NOTE — Progress Notes (Signed)
   Reason for call:   I received a call from [name] at 8:50 PM indicating catching in his back when he lays on his right side and goes from laying down to trying to sit up.   Pertinent Data:   When he tries to lie on his right side, when he tries to get up he gets a muscle catching in right side and back, and he has to lay back down and then try to sit back up slowly. This has never happened before and has been going on for two days. This does not happen when he lays on his left side. He walks around for a minute and the feeling goes away. He has not tried anything for the pain.   He does not have nausea, diaphoresis, no chest pain, shortness of breath, no palpitations, changes in urination, dysuria, no lower extremity numbness or tingling.   No recent changes in medications except he was given prednisone for swelling in his right foot. This has resolved and he has no swelling or pain in his right foot or leg.   He was supposed to follow-up with the clinic for his lower back pain with telephone call 1/02 but has not yet been able to.    Assessment / Plan / Recommendations:   Overall symptoms seem likely consistent with muscle spasm on the right. Advised him to alternate ice/heat, tylenol prn, and he can use his oxycodone as needed. If symptoms continue advised he go to urgent care as our clinic won't be open for the next two days.  Will send message to clinic to get follow-up appointment scheduled   As always, pt is advised that if symptoms worsen or new symptoms arise, they should go to an urgent care facility or to to ER for further evaluation. Discussed this with him and he understood. He said he will likely go to urgent care in the morning.    [name] A, DO   01/19/2019, 8:35 PM

## 2019-01-29 DIAGNOSIS — Z0100 Encounter for examination of eyes and vision without abnormal findings: Secondary | ICD-10-CM | POA: Diagnosis not present

## 2019-02-08 ENCOUNTER — Encounter: Payer: Self-pay | Admitting: General Practice

## 2019-02-11 ENCOUNTER — Other Ambulatory Visit: Payer: Self-pay | Admitting: Internal Medicine

## 2019-02-11 ENCOUNTER — Telehealth: Payer: Self-pay | Admitting: Internal Medicine

## 2019-02-11 MED ORDER — PANTOPRAZOLE SODIUM 40 MG PO TBEC: 40.0000 mg | DELAYED_RELEASE_TABLET | Freq: Every day | ORAL | 0 refills | Status: DC

## 2019-02-11 NOTE — Telephone Encounter (Signed)
   Reason for call:   I received a call from [name] at 53 PM indicating that he needs a refill of his acid reflux medication.   Pertinent Data:   Per patient's pcp Dr. Myrtie Lane the patient is being prescripbed pantoprazole 40mg  qd   Assessment / Plan / Recommendations:   Refilled 30 day script of pantoprazole  As always, pt is advised that if symptoms worsen or new symptoms arise, they should go to an urgent care facility or to to ER for further evaluation.   Dylan Mage, MD   02/11/2019, 6:30 PM

## 2019-02-12 NOTE — Telephone Encounter (Signed)
Entered in error

## 2019-02-18 ENCOUNTER — Telehealth: Payer: Self-pay | Admitting: Internal Medicine

## 2019-02-18 ENCOUNTER — Other Ambulatory Visit: Payer: Self-pay | Admitting: Internal Medicine

## 2019-02-18 DIAGNOSIS — R0789 Other chest pain: Secondary | ICD-10-CM

## 2019-02-18 NOTE — Telephone Encounter (Signed)
Per chart-pt has transferred his care to another office and has an appt to be seen on 03/19/2019. Will send this refill request to his former [facility] pcp to see if she would like to give pt 30-day supply of medications.  Please advise.Despina Hidden Cassady2/15/20212:19 PM

## 2019-02-18 NOTE — Telephone Encounter (Signed)
   Reason for call:   I received a call from [name] at 5:03 PM indicating that he was out of his  Amlodipine and pravastatin.    Pertinent Data:   I did not see a contraindication to refilling both. He denied any symptoms that were new or concerning to him.    Assessment / Plan / Recommendations:   Refilled both medications so as to prevent a lapse in care.   As always, pt is advised that if symptoms worsen or new symptoms arise, they should go to an urgent care facility or to to ER for further evaluation.   Dylan Ludwig, MD   02/18/2019, 5:26 PM

## 2019-02-18 NOTE — Telephone Encounter (Signed)
Need refill on amLODipine (NORVASC) 10 MG tablet pravastatin (PRAVACHOL) 40 MG tablet  [place], [place] - [place]

## 2019-03-06 DIAGNOSIS — N401 Enlarged prostate with lower urinary tract symptoms: Secondary | ICD-10-CM | POA: Diagnosis not present

## 2019-03-06 DIAGNOSIS — R351 Nocturia: Secondary | ICD-10-CM | POA: Diagnosis not present

## 2019-03-12 ENCOUNTER — Other Ambulatory Visit: Payer: Self-pay | Admitting: Internal Medicine

## 2019-03-14 ENCOUNTER — Other Ambulatory Visit: Payer: Self-pay | Admitting: Internal Medicine

## 2019-03-18 ENCOUNTER — Other Ambulatory Visit: Payer: Self-pay | Admitting: Internal Medicine

## 2019-03-18 ENCOUNTER — Telehealth: Payer: Self-pay | Admitting: Internal Medicine

## 2019-03-18 ENCOUNTER — Other Ambulatory Visit: Payer: Self-pay

## 2019-03-18 MED ORDER — PANTOPRAZOLE SODIUM 40 MG PO TBEC: 40.0000 mg | DELAYED_RELEASE_TABLET | Freq: Every day | ORAL | 1 refills | Status: DC

## 2019-03-18 NOTE — Telephone Encounter (Signed)
Patient called after hours pager requesting refill on pantoprazole. Refill sent.   Ina Homes, MD

## 2019-03-19 ENCOUNTER — Encounter: Payer: Self-pay | Admitting: Adult Health

## 2019-03-19 ENCOUNTER — Ambulatory Visit (INDEPENDENT_AMBULATORY_CARE_PROVIDER_SITE_OTHER): Payer: Medicare HMO | Admitting: Adult Health

## 2019-03-19 VITALS — BP 124/80 | Temp 97.8°F | Wt 209.0 lb

## 2019-03-19 DIAGNOSIS — Z7689 Persons encountering health services in other specified circumstances: Secondary | ICD-10-CM | POA: Diagnosis not present

## 2019-03-19 DIAGNOSIS — I10 Essential (primary) hypertension: Secondary | ICD-10-CM

## 2019-03-19 DIAGNOSIS — E785 Hyperlipidemia, unspecified: Secondary | ICD-10-CM | POA: Diagnosis not present

## 2019-03-19 DIAGNOSIS — E7439 Other disorders of intestinal carbohydrate absorption: Secondary | ICD-10-CM

## 2019-03-19 LAB — CBC WITH DIFFERENTIAL/PLATELET
Basophils Absolute: 0 10*3/uL (ref 0.0–0.1)
Basophils Relative: 0.6 % (ref 0.0–3.0)
Eosinophils Absolute: 0.1 10*3/uL (ref 0.0–0.7)
Eosinophils Relative: 2 % (ref 0.0–5.0)
HCT: 44.5 % (ref 39.0–52.0)
Hemoglobin: 15.5 g/dL (ref 13.0–17.0)
Lymphocytes Relative: 34.9 % (ref 12.0–46.0)
Lymphs Abs: 2.2 10*3/uL (ref 0.7–4.0)
MCHC: 34.7 g/dL (ref 30.0–36.0)
MCV: 84.4 fl (ref 78.0–100.0)
Monocytes Absolute: 0.5 10*3/uL (ref 0.1–1.0)
Monocytes Relative: 8.3 % (ref 3.0–12.0)
Neutro Abs: 3.4 10*3/uL (ref 1.4–7.7)
Neutrophils Relative %: 54.2 % (ref 43.0–77.0)
Platelets: 209 10*3/uL (ref 150.0–400.0)
RBC: 5.27 Mil/uL (ref 4.22–5.81)
RDW: 14 % (ref 11.5–15.5)
WBC: 6.3 10*3/uL (ref 4.0–10.5)

## 2019-03-19 LAB — BASIC METABOLIC PANEL
BUN: 13 mg/dL (ref 6–23)
CO2: 30 mEq/L (ref 19–32)
Calcium: 10.5 mg/dL (ref 8.4–10.5)
Chloride: 102 mEq/L (ref 96–112)
Creatinine, Ser: 1.24 mg/dL (ref 0.40–1.50)
GFR: 70.51 mL/min (ref >60.00)
Glucose, Bld: 97 mg/dL (ref 70–99)
Potassium: 4.3 mEq/L (ref 3.5–5.1)
Sodium: 139 mEq/L (ref 135–145)

## 2019-03-19 LAB — HEMOGLOBIN A1C: Hgb A1c MFr Bld: 5.6 % (ref 4.6–6.5)

## 2019-03-19 NOTE — Patient Instructions (Addendum)
It was great seeing you today   Please follow up with me after July 13th for your physical
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
## 2019-03-20 ENCOUNTER — Telehealth: Payer: Self-pay | Admitting: Family Medicine

## 2019-03-20 NOTE — Telephone Encounter (Signed)
Pt would like for [facility] to recommend a good laxative.  Pt stated, "I would like to clean myself out."  Pt denied having constipation.  Will forward to [facility].

## 2019-03-20 NOTE — Telephone Encounter (Signed)
If he is not having any symptoms then there is no need to " clean himself out" - detoxing is a myth.   He wants to take something every day to help with digestion and keep everything moving then use Benifiber

## 2019-03-20 NOTE — Telephone Encounter (Signed)
Tried reaching the pt by telephone.  Received an automated message that the voicemail box is full and cannot accept messages.  Will try again at a later time.

## 2019-03-22 NOTE — Telephone Encounter (Signed)
Pt notified of the below message.  Nothing further needed.

## 2019-04-16 ENCOUNTER — Telehealth: Payer: Self-pay | Admitting: Adult Health

## 2019-04-16 NOTE — Telephone Encounter (Signed)
Pt scheduled for follow up appointment.  Nothing further needed.

## 2019-04-16 NOTE — Telephone Encounter (Signed)
Lets do a follow up appointment

## 2019-04-16 NOTE — Telephone Encounter (Signed)
Pt is calling in stating that he still has the pain that he had discuss with you previously pt did not want to elaborate stated it is personal.

## 2019-04-19 ENCOUNTER — Other Ambulatory Visit: Payer: Self-pay | Admitting: Internal Medicine

## 2019-04-19 DIAGNOSIS — R0789 Other chest pain: Secondary | ICD-10-CM

## 2019-04-23 ENCOUNTER — Ambulatory Visit (INDEPENDENT_AMBULATORY_CARE_PROVIDER_SITE_OTHER): Payer: Medicare HMO | Admitting: Adult Health

## 2019-04-23 ENCOUNTER — Other Ambulatory Visit: Payer: Self-pay

## 2019-04-23 ENCOUNTER — Ambulatory Visit (INDEPENDENT_AMBULATORY_CARE_PROVIDER_SITE_OTHER): Payer: Medicare HMO

## 2019-04-23 ENCOUNTER — Encounter: Payer: Self-pay | Admitting: Adult Health

## 2019-04-23 VITALS — BP 140/82 | HR 84 | Temp 98.3°F | Ht 67.0 in | Wt 208.2 lb

## 2019-04-23 DIAGNOSIS — G8929 Other chronic pain: Secondary | ICD-10-CM | POA: Diagnosis not present

## 2019-04-23 DIAGNOSIS — M545 Low back pain, unspecified: Secondary | ICD-10-CM

## 2019-04-23 NOTE — Addendum Note (Signed)
Addended by: Suzette Battiest on: 04/23/2019 10:05 AM   Modules accepted: Orders

## 2019-04-23 NOTE — Patient Instructions (Signed)
It was great seeing you today   Please get some good insoles   We will follow up with you regarding your xray
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
## 2019-05-25 ENCOUNTER — Other Ambulatory Visit: Payer: Self-pay | Admitting: Internal Medicine

## 2019-05-25 ENCOUNTER — Encounter: Payer: Self-pay | Admitting: Adult Health

## 2019-05-25 DIAGNOSIS — R0789 Other chest pain: Secondary | ICD-10-CM

## 2019-05-28 MED ORDER — PRAVASTATIN SODIUM 40 MG PO TABS: 40.0000 mg | ORAL_TABLET | Freq: Every day | ORAL | 0 refills | Status: DC

## 2019-05-28 MED ORDER — AMLODIPINE BESYLATE 10 MG PO TABS: 10.0000 mg | ORAL_TABLET | Freq: Every day | ORAL | 0 refills | Status: DC

## 2019-07-16 ENCOUNTER — Other Ambulatory Visit: Payer: Self-pay

## 2019-07-16 ENCOUNTER — Ambulatory Visit (INDEPENDENT_AMBULATORY_CARE_PROVIDER_SITE_OTHER): Payer: Medicare HMO | Admitting: Adult Health

## 2019-07-16 ENCOUNTER — Encounter: Payer: Self-pay | Admitting: Adult Health

## 2019-07-16 VITALS — BP 130/94 | Temp 97.5°F | Ht 67.5 in | Wt 207.0 lb

## 2019-07-16 DIAGNOSIS — N400 Enlarged prostate without lower urinary tract symptoms: Secondary | ICD-10-CM | POA: Diagnosis not present

## 2019-07-16 DIAGNOSIS — M65331 Trigger finger, right middle finger: Secondary | ICD-10-CM

## 2019-07-16 DIAGNOSIS — I10 Essential (primary) hypertension: Secondary | ICD-10-CM

## 2019-07-16 DIAGNOSIS — R7303 Prediabetes: Secondary | ICD-10-CM | POA: Diagnosis not present

## 2019-07-16 DIAGNOSIS — E785 Hyperlipidemia, unspecified: Secondary | ICD-10-CM

## 2019-07-16 DIAGNOSIS — Z0000 Encounter for general adult medical examination without abnormal findings: Secondary | ICD-10-CM

## 2019-07-16 DIAGNOSIS — Z23 Encounter for immunization: Secondary | ICD-10-CM

## 2019-07-16 DIAGNOSIS — K219 Gastro-esophageal reflux disease without esophagitis: Secondary | ICD-10-CM
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
## 2019-07-16 NOTE — Patient Instructions (Signed)
It was great seeing you today   We will follow up with you regarding your labs   Someone will call you from orthopedics about the trigger finger   Please get back into the gym

## 2019-07-16 NOTE — Addendum Note (Signed)
Addended by: Miles Costain T on: 07/16/2019 09:37 AM   Modules accepted: Orders

## 2019-07-17 ENCOUNTER — Telehealth: Payer: Self-pay | Admitting: Adult Health

## 2019-07-17 LAB — CBC WITH DIFFERENTIAL/PLATELET
Absolute Monocytes: 351 cells/uL (ref 200–950)
Basophils Absolute: 32 cells/uL (ref 0–200)
Basophils Relative: 0.6 %
Eosinophils Absolute: 151 cells/uL (ref 15–500)
Eosinophils Relative: 2.8 %
HCT: 46.7 % (ref 38.5–50.0)
Hemoglobin: 15.4 g/dL (ref 13.2–17.1)
Lymphs Abs: 2090 cells/uL (ref 850–3900)
MCH: 28.9 pg (ref 27.0–33.0)
MCHC: 33 g/dL (ref 32.0–36.0)
MCV: 87.6 fL (ref 80.0–100.0)
MPV: 11.7 fL (ref 7.5–12.5)
Monocytes Relative: 6.5 %
Neutro Abs: 2776 cells/uL (ref 1500–7800)
Neutrophils Relative %: 51.4 %
Platelets: 227 10*3/uL (ref 140–400)
RBC: 5.33 10*6/uL (ref 4.20–5.80)
RDW: 13.6 % (ref 11.0–15.0)
Total Lymphocyte: 38.7 %
WBC: 5.4 10*3/uL (ref 3.8–10.8)

## 2019-07-17 LAB — COMPREHENSIVE METABOLIC PANEL
AG Ratio: 1.7 (calc) (ref 1.0–2.5)
ALT: 16 U/L (ref 9–46)
AST: 16 U/L (ref 10–35)
Albumin: 4.5 g/dL (ref 3.6–5.1)
Alkaline phosphatase (APISO): 98 U/L (ref 35–144)
BUN/Creatinine Ratio: 9 (calc) (ref 6–22)
BUN: 11 mg/dL (ref 7–25)
CO2: 30 mmol/L (ref 20–32)
Calcium: 10.2 mg/dL (ref 8.6–10.3)
Chloride: 104 mmol/L (ref 98–110)
Creat: 1.26 mg/dL — ABNORMAL HIGH (ref 0.70–1.25)
Globulin: 2.7 g/dL (calc) (ref 1.9–3.7)
Glucose, Bld: 116 mg/dL — ABNORMAL HIGH (ref 65–99)
Potassium: 4.4 mmol/L (ref 3.5–5.3)
Sodium: 142 mmol/L (ref 135–146)
Total Bilirubin: 0.6 mg/dL (ref 0.2–1.2)
Total Protein: 7.2 g/dL (ref 6.1–8.1)

## 2019-07-17 LAB — LIPID PANEL
Cholesterol: 168 mg/dL (ref <200)
HDL: 44 mg/dL (ref >=40)
LDL Cholesterol (Calc): 108 mg/dL (calc) — ABNORMAL HIGH
Non-HDL Cholesterol (Calc): 124 mg/dL (calc) (ref <130)
Total CHOL/HDL Ratio: 3.8 (calc) (ref <5.0)
Triglycerides: 69 mg/dL (ref <150)

## 2019-07-17 LAB — HEMOGLOBIN A1C
Hgb A1c MFr Bld: 5.8 % of total Hgb — ABNORMAL HIGH (ref <5.7)
Mean Plasma Glucose: 120 (calc)
eAG (mmol/L): 6.6 (calc)

## 2019-07-17 LAB — TSH: TSH: 1.85 mIU/L (ref 0.40–4.50)

## 2019-07-17 LAB — PSA: PSA: 0.8 ng/mL (ref <=4.0)

## 2019-07-17 NOTE — Telephone Encounter (Signed)
He can have it in moderation but watermelon does have a lot of sugar

## 2019-07-17 NOTE — Telephone Encounter (Signed)
Pt notified of below message.  Nothing further needed. 

## 2019-07-17 NOTE — Telephone Encounter (Signed)
Patient is on a special diet for his sugar.  He has a question on whether he can have watermelon or not?

## 2019-07-18 ENCOUNTER — Telehealth: Payer: Self-pay | Admitting: Adult Health

## 2019-07-18 NOTE — Telephone Encounter (Signed)
Pt called did not want to elaborate on why he needs a call back.  Stated it is personal.

## 2019-07-18 NOTE — Telephone Encounter (Signed)
Spoke to the pt.  He has recently received the Shingrix vaccine.  Last night he reports waking up with pressure in his head and shaking from head to toe like a "seizure."  He stated he was breathing heavily.  He had a friend with him and his friend encouraged him to slow his breathing and remain calm.  He was also feeling cold and then hot.  After a few minutes he was fine and back to normal.  He doesn't remember having a bad dream or anything that would cause anxiety.  Advised that Tommi Rumps is not in the office but will be back on Friday.  Instructed him to seek emergency help if this happened again.  Will forward to [facility] for advise.

## 2019-07-18 NOTE — Telephone Encounter (Signed)
LEFT A MESSAGE FOR A RETURN CALL

## 2019-07-18 NOTE — Telephone Encounter (Signed)
Pt returned the call to the office. 

## 2019-07-19 NOTE — Telephone Encounter (Signed)
I am not sure what that is from. Is he feeling better? Any more episodes ?

## 2019-07-19 NOTE — Telephone Encounter (Signed)
Spoke to the pt and he reports that he is feeling much better.  No episodes last night.  Stated he slept well.  If this happens again he will let us know.  Nothing further needed at this time.

## 2019-08-07 ENCOUNTER — Encounter: Payer: Self-pay | Admitting: Adult Health

## 2019-08-13 DIAGNOSIS — M79644 Pain in right finger(s): Secondary | ICD-10-CM | POA: Diagnosis not present

## 2019-08-13 DIAGNOSIS — M65331 Trigger finger, right middle finger: Secondary | ICD-10-CM | POA: Diagnosis not present

## 2019-08-13 DIAGNOSIS — M65332 Trigger finger, left middle finger: Secondary | ICD-10-CM | POA: Insufficient documentation

## 2019-08-13 DIAGNOSIS — M79645 Pain in left finger(s): Secondary | ICD-10-CM | POA: Insufficient documentation

## 2019-08-31 ENCOUNTER — Other Ambulatory Visit: Payer: Self-pay | Admitting: Adult Health

## 2019-08-31 DIAGNOSIS — R0789 Other chest pain: Secondary | ICD-10-CM

## 2019-09-11 ENCOUNTER — Other Ambulatory Visit: Payer: Self-pay | Admitting: Adult Health

## 2019-09-24 ENCOUNTER — Other Ambulatory Visit: Payer: Self-pay

## 2019-09-24 ENCOUNTER — Encounter: Payer: Self-pay | Admitting: Adult Health

## 2019-09-24 ENCOUNTER — Ambulatory Visit (INDEPENDENT_AMBULATORY_CARE_PROVIDER_SITE_OTHER): Payer: Medicare HMO | Admitting: Adult Health

## 2019-09-24 VITALS — BP 134/88 | HR 68 | Temp 97.7°F | Wt 200.0 lb

## 2019-09-24 DIAGNOSIS — G8929 Other chronic pain: Secondary | ICD-10-CM

## 2019-09-24 DIAGNOSIS — M545 Low back pain: Secondary | ICD-10-CM | POA: Diagnosis not present
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
## 2019-10-04 ENCOUNTER — Ambulatory Visit: Payer: Medicare HMO | Admitting: Adult Health

## 2019-10-08 ENCOUNTER — Other Ambulatory Visit: Payer: Self-pay

## 2019-10-08 ENCOUNTER — Ambulatory Visit (INDEPENDENT_AMBULATORY_CARE_PROVIDER_SITE_OTHER): Payer: Medicare HMO | Admitting: Adult Health

## 2019-10-08 ENCOUNTER — Encounter: Payer: Self-pay | Admitting: Adult Health

## 2019-10-08 VITALS — BP 120/80 | HR 68 | Temp 97.7°F | Ht 67.0 in | Wt 201.8 lb

## 2019-10-08 DIAGNOSIS — M545 Low back pain, unspecified: Secondary | ICD-10-CM | POA: Diagnosis not present

## 2019-10-08 LAB — POCT URINALYSIS DIPSTICK
Bilirubin, UA: NEGATIVE
Blood, UA: NEGATIVE
Glucose, UA: NEGATIVE
Ketones, UA: NEGATIVE
Leukocytes, UA: NEGATIVE
Nitrite, UA: NEGATIVE
Protein, UA: NEGATIVE
Spec Grav, UA: 1.02 (ref 1.010–1.025)
Urobilinogen, UA: 0.2 E.U./dL
pH, UA: 6 (ref 5.0–8.0)

## 2019-10-08 MED ORDER — CYCLOBENZAPRINE HCL 10 MG PO TABS: 10.0000 mg | ORAL_TABLET | Freq: Every day | ORAL | 0 refills | Status: DC

## 2019-10-08 NOTE — Addendum Note (Signed)
Addended by: Marrion Coy on: 10/08/2019 01:55 PM   Modules accepted: Orders
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
## 2019-10-09 LAB — COMPLETE METABOLIC PANEL WITH GFR
AG Ratio: 1.8 (calc) (ref 1.0–2.5)
ALT: 14 U/L (ref 9–46)
AST: 12 U/L (ref 10–35)
Albumin: 4.6 g/dL (ref 3.6–5.1)
Alkaline phosphatase (APISO): 91 U/L (ref 35–144)
BUN: 16 mg/dL (ref 7–25)
CO2: 31 mmol/L (ref 20–32)
Calcium: 10.4 mg/dL — ABNORMAL HIGH (ref 8.6–10.3)
Chloride: 104 mmol/L (ref 98–110)
Creat: 1.23 mg/dL (ref 0.70–1.25)
GFR, Est African American: 70 mL/min/{1.73_m2} (ref >=60)
GFR, Est Non African American: 61 mL/min/{1.73_m2} (ref >=60)
Globulin: 2.6 g/dL (calc) (ref 1.9–3.7)
Glucose, Bld: 90 mg/dL (ref 65–99)
Potassium: 4.2 mmol/L (ref 3.5–5.3)
Sodium: 140 mmol/L (ref 135–146)
Total Bilirubin: 0.5 mg/dL (ref 0.2–1.2)
Total Protein: 7.2 g/dL (ref 6.1–8.1)

## 2019-10-21 ENCOUNTER — Encounter (HOSPITAL_COMMUNITY): Payer: Self-pay

## 2019-10-21 ENCOUNTER — Encounter (HOSPITAL_COMMUNITY)
Admission: EM | Disposition: A | Discharge status: Home / Self Care | Payer: Self-pay | Source: Home / Self Care | Attending: Emergency Medicine

## 2019-10-21 ENCOUNTER — Emergency Department (HOSPITAL_COMMUNITY): Payer: Medicare HMO

## 2019-10-21 ENCOUNTER — Telehealth: Payer: Self-pay | Admitting: Adult Health

## 2019-10-21 ENCOUNTER — Other Ambulatory Visit: Payer: Self-pay

## 2019-10-21 ENCOUNTER — Observation Stay (HOSPITAL_COMMUNITY)
Admission: EM | Admit: 2019-10-21 | Discharge: 2019-10-22 | Disposition: A | Discharge status: Home / Self Care | Payer: Medicare HMO | Attending: Interventional Cardiology | Admitting: Interventional Cardiology

## 2019-10-21 DIAGNOSIS — Z20822 Contact with and (suspected) exposure to covid-19: Secondary | ICD-10-CM | POA: Insufficient documentation

## 2019-10-21 DIAGNOSIS — I2510 Atherosclerotic heart disease of native coronary artery without angina pectoris: Secondary | ICD-10-CM | POA: Diagnosis not present

## 2019-10-21 DIAGNOSIS — Z79899 Other long term (current) drug therapy: Secondary | ICD-10-CM | POA: Insufficient documentation

## 2019-10-21 DIAGNOSIS — I10 Essential (primary) hypertension: Secondary | ICD-10-CM

## 2019-10-21 DIAGNOSIS — E785 Hyperlipidemia, unspecified: Secondary | ICD-10-CM

## 2019-10-21 DIAGNOSIS — M545 Low back pain, unspecified: Secondary | ICD-10-CM

## 2019-10-21 DIAGNOSIS — Z955 Presence of coronary angioplasty implant and graft: Secondary | ICD-10-CM

## 2019-10-21 DIAGNOSIS — R7303 Prediabetes: Secondary | ICD-10-CM | POA: Insufficient documentation

## 2019-10-21 DIAGNOSIS — Z7982 Long term (current) use of aspirin: Secondary | ICD-10-CM | POA: Insufficient documentation

## 2019-10-21 DIAGNOSIS — Z87891 Personal history of nicotine dependence: Secondary | ICD-10-CM | POA: Insufficient documentation

## 2019-10-21 DIAGNOSIS — R0789 Other chest pain: Secondary | ICD-10-CM | POA: Diagnosis not present

## 2019-10-21 DIAGNOSIS — I214 Non-ST elevation (NSTEMI) myocardial infarction: Secondary | ICD-10-CM | POA: Diagnosis not present

## 2019-10-21 HISTORY — PX: LEFT HEART CATH AND CORONARY ANGIOGRAPHY: CATH118249

## 2019-10-21 HISTORY — DX: Atherosclerotic heart disease of native coronary artery without angina pectoris: I25.10

## 2019-10-21 HISTORY — PX: CORONARY STENT INTERVENTION: CATH118234

## 2019-10-21 LAB — CBC
HCT: 44.5 % (ref 39.0–52.0)
Hemoglobin: 15.3 g/dL (ref 13.0–17.0)
MCH: 28.1 pg (ref 26.0–34.0)
MCHC: 34.4 g/dL (ref 30.0–36.0)
MCV: 81.8 fL (ref 80.0–100.0)
Platelets: 248 10*3/uL (ref 150–400)
RBC: 5.44 MIL/uL (ref 4.22–5.81)
RDW: 13.2 % (ref 11.5–15.5)
WBC: 5.4 10*3/uL (ref 4.0–10.5)
nRBC: 0 % (ref 0.0–0.2)

## 2019-10-21 LAB — BASIC METABOLIC PANEL
Anion gap: 10 (ref 5–15)
BUN: 7 mg/dL — ABNORMAL LOW (ref 8–23)
CO2: 24 mmol/L (ref 22–32)
Calcium: 9.8 mg/dL (ref 8.9–10.3)
Chloride: 104 mmol/L (ref 98–111)
Creatinine, Ser: 1.16 mg/dL (ref 0.61–1.24)
GFR, Estimated: >60 mL/min (ref >60)
Glucose, Bld: 109 mg/dL — ABNORMAL HIGH (ref 70–99)
Potassium: 3.6 mmol/L (ref 3.5–5.1)
Sodium: 138 mmol/L (ref 135–145)

## 2019-10-21 LAB — HIV ANTIBODY (ROUTINE TESTING W REFLEX): HIV Screen 4th Generation wRfx: NONREACTIVE

## 2019-10-21 LAB — RESPIRATORY PANEL BY RT PCR (FLU A&B, COVID)
Influenza A by PCR: NEGATIVE
Influenza B by PCR: NEGATIVE
SARS Coronavirus 2 by RT PCR: NEGATIVE

## 2019-10-21 LAB — TROPONIN I (HIGH SENSITIVITY)
Troponin I (High Sensitivity): 200 ng/L (ref <18)
Troponin I (High Sensitivity): 217 ng/L (ref <18)
Troponin I (High Sensitivity): 203 ng/L (ref <18)
Troponin I (High Sensitivity): 387 ng/L (ref <18)

## 2019-10-21 LAB — POCT ACTIVATED CLOTTING TIME: Activated Clotting Time: 362 seconds

## 2019-10-21 SURGERY — LEFT HEART CATH AND CORONARY ANGIOGRAPHY
Anesthesia: LOCAL

## 2019-10-21 MED ORDER — SODIUM CHLORIDE 0.9 % WEIGHT BASED INFUSION: 1.0000 mL/kg/h | INTRAVENOUS | Status: DC

## 2019-10-21 MED ORDER — SODIUM CHLORIDE 0.9 % IV SOLN
INTRAVENOUS | Status: AC | PRN
  Administered 2019-10-21: 250 mL via INTRAVENOUS

## 2019-10-21 MED ORDER — TICAGRELOR 90 MG PO TABS
90.0000 mg | ORAL_TABLET | Freq: Two times a day (BID) | ORAL | Status: DC
  Administered 2019-10-22: 90 mg via ORAL
  Filled 2019-10-21: qty 1

## 2019-10-21 MED ORDER — ATORVASTATIN CALCIUM 80 MG PO TABS
80.0000 mg | ORAL_TABLET | Freq: Every day | ORAL | Status: DC
  Administered 2019-10-22: 80 mg via ORAL
  Filled 2019-10-21: qty 1
  Filled 2019-10-21: qty 8
  Filled 2019-10-21: qty 1

## 2019-10-21 MED ORDER — NITROGLYCERIN 0.4 MG SL SUBL: 0.4000 mg | SUBLINGUAL_TABLET | SUBLINGUAL | Status: DC | PRN

## 2019-10-21 MED ORDER — ACETAMINOPHEN 325 MG PO TABS: 650.0000 mg | ORAL_TABLET | ORAL | Status: DC | PRN

## 2019-10-21 MED ORDER — SODIUM CHLORIDE 0.9% FLUSH: 3.0000 mL | Freq: Two times a day (BID) | INTRAVENOUS | Status: DC

## 2019-10-21 MED ORDER — PANTOPRAZOLE SODIUM 40 MG PO TBEC
40.0000 mg | DELAYED_RELEASE_TABLET | Freq: Every day | ORAL | Status: DC
  Administered 2019-10-21 – 2019-10-22 (×2): 40 mg via ORAL
  Filled 2019-10-21 (×2): qty 1

## 2019-10-21 MED ORDER — MORPHINE SULFATE (PF) 2 MG/ML IV SOLN: 2.0000 mg | INTRAVENOUS | Status: DC | PRN

## 2019-10-21 MED ORDER — HEPARIN (PORCINE) IN NACL 1000-0.9 UT/500ML-% IV SOLN
INTRAVENOUS | Status: DC | PRN
  Administered 2019-10-21 (×2): 500 mL

## 2019-10-21 MED ORDER — HYDRALAZINE HCL 20 MG/ML IJ SOLN: 10.0000 mg | INTRAMUSCULAR | Status: AC | PRN

## 2019-10-21 MED ORDER — VERAPAMIL HCL 2.5 MG/ML IV SOLN
INTRAVENOUS | Status: AC
  Filled 2019-10-21: qty 2

## 2019-10-21 MED ORDER — ASPIRIN EC 81 MG PO TBEC
81.0000 mg | DELAYED_RELEASE_TABLET | Freq: Every day | ORAL | Status: DC
  Filled 2019-10-21 (×3): qty 1

## 2019-10-21 MED ORDER — NITROGLYCERIN 1 MG/10 ML FOR IR/CATH LAB
INTRA_ARTERIAL | Status: AC
  Filled 2019-10-21: qty 10

## 2019-10-21 MED ORDER — ASPIRIN 81 MG PO CHEW
81.0000 mg | CHEWABLE_TABLET | Freq: Every day | ORAL | Status: DC
  Administered 2019-10-22: 81 mg via ORAL
  Filled 2019-10-21: qty 1

## 2019-10-21 MED ORDER — IOHEXOL 350 MG/ML SOLN
INTRAVENOUS | Status: DC | PRN
  Administered 2019-10-21: 115 mL

## 2019-10-21 MED ORDER — FENTANYL CITRATE (PF) 100 MCG/2ML IJ SOLN
INTRAMUSCULAR | Status: DC | PRN
Start: 2019-10-21 — End: 2019-10-21
  Administered 2019-10-21: 25 ug via INTRAVENOUS

## 2019-10-21 MED ORDER — ONDANSETRON HCL 4 MG/2ML IJ SOLN: 4.0000 mg | Freq: Four times a day (QID) | INTRAMUSCULAR | Status: DC | PRN

## 2019-10-21 MED ORDER — SODIUM CHLORIDE 0.9 % IV SOLN: INTRAVENOUS | Status: AC

## 2019-10-21 MED ORDER — LIDOCAINE HCL (PF) 1 % IJ SOLN
INTRAMUSCULAR | Status: AC
  Filled 2019-10-21: qty 30

## 2019-10-21 MED ORDER — HEPARIN (PORCINE) 25000 UT/250ML-% IV SOLN: 1100.0000 [IU]/h | INTRAVENOUS | Status: DC

## 2019-10-21 MED ORDER — HEPARIN SODIUM (PORCINE) 1000 UNIT/ML IJ SOLN
INTRAMUSCULAR | Status: AC
  Filled 2019-10-21: qty 1

## 2019-10-21 MED ORDER — VERAPAMIL HCL 2.5 MG/ML IV SOLN
INTRA_ARTERIAL | Status: DC | PRN
  Administered 2019-10-21: 5 mL via INTRA_ARTERIAL
  Administered 2019-10-21: 10 mL via INTRA_ARTERIAL

## 2019-10-21 MED ORDER — AMLODIPINE BESYLATE 10 MG PO TABS
10.0000 mg | ORAL_TABLET | Freq: Every day | ORAL | Status: DC
  Administered 2019-10-21 – 2019-10-22 (×2): 10 mg via ORAL
  Filled 2019-10-21: qty 2
  Filled 2019-10-21: qty 1

## 2019-10-21 MED ORDER — ATORVASTATIN CALCIUM 80 MG PO TABS
80.0000 mg | ORAL_TABLET | Freq: Every day | ORAL | Status: DC
  Administered 2019-10-21: 80 mg via ORAL

## 2019-10-21 MED ORDER — SODIUM CHLORIDE 0.9 % IV SOLN: 250.0000 mL | INTRAVENOUS | Status: DC | PRN

## 2019-10-21 MED ORDER — HEPARIN SODIUM (PORCINE) 1000 UNIT/ML IJ SOLN
INTRAMUSCULAR | Status: DC | PRN
  Administered 2019-10-21: 4500 [IU] via INTRAVENOUS
  Administered 2019-10-21: 5500 [IU] via INTRAVENOUS

## 2019-10-21 MED ORDER — MIDAZOLAM HCL 2 MG/2ML IJ SOLN
INTRAMUSCULAR | Status: DC | PRN
  Administered 2019-10-21: 1 mg via INTRAVENOUS

## 2019-10-21 MED ORDER — SODIUM CHLORIDE 0.9% FLUSH
3.0000 mL | Freq: Two times a day (BID) | INTRAVENOUS | Status: DC
  Administered 2019-10-22: 3 mL via INTRAVENOUS

## 2019-10-21 MED ORDER — SODIUM CHLORIDE 0.9 % WEIGHT BASED INFUSION: 3.0000 mL/kg/h | INTRAVENOUS | Status: DC

## 2019-10-21 MED ORDER — HEPARIN (PORCINE) 25000 UT/250ML-% IV SOLN
1000.0000 [IU]/h | INTRAVENOUS | Status: DC
  Administered 2019-10-21: 1000 [IU]/h via INTRAVENOUS
  Filled 2019-10-21: qty 250

## 2019-10-21 MED ORDER — HEPARIN BOLUS VIA INFUSION
4000.0000 [IU] | Freq: Once | INTRAVENOUS | Status: AC
  Administered 2019-10-21: 4000 [IU] via INTRAVENOUS
  Filled 2019-10-21: qty 4000

## 2019-10-21 MED ORDER — LIDOCAINE HCL (PF) 1 % IJ SOLN
INTRAMUSCULAR | Status: DC | PRN
  Administered 2019-10-21: 2 mL via INTRADERMAL

## 2019-10-21 MED ORDER — ASPIRIN 81 MG PO CHEW
324.0000 mg | CHEWABLE_TABLET | Freq: Once | ORAL | Status: AC
  Administered 2019-10-21: 324 mg via ORAL
  Filled 2019-10-21: qty 4

## 2019-10-21 MED ORDER — LABETALOL HCL 5 MG/ML IV SOLN: 10.0000 mg | INTRAVENOUS | Status: AC | PRN

## 2019-10-21 MED ORDER — HEPARIN BOLUS VIA INFUSION
4000.0000 [IU] | Freq: Once | INTRAVENOUS | Status: DC
  Filled 2019-10-21: qty 4000

## 2019-10-21 MED ORDER — TICAGRELOR 90 MG PO TABS
ORAL_TABLET | ORAL | Status: AC
  Filled 2019-10-21: qty 2

## 2019-10-21 MED ORDER — HEPARIN (PORCINE) IN NACL 1000-0.9 UT/500ML-% IV SOLN
INTRAVENOUS | Status: AC
  Filled 2019-10-21: qty 1000

## 2019-10-21 MED ORDER — SODIUM CHLORIDE 0.9% FLUSH: 3.0000 mL | INTRAVENOUS | Status: DC | PRN

## 2019-10-21 MED ORDER — FENTANYL CITRATE (PF) 100 MCG/2ML IJ SOLN
INTRAMUSCULAR | Status: AC
  Filled 2019-10-21: qty 2

## 2019-10-21 MED ORDER — MIDAZOLAM HCL 2 MG/2ML IJ SOLN
INTRAMUSCULAR | Status: AC
  Filled 2019-10-21: qty 2

## 2019-10-21 MED ORDER — TICAGRELOR 90 MG PO TABS
ORAL_TABLET | ORAL | Status: DC | PRN
  Administered 2019-10-21: 180 mg via ORAL

## 2019-10-21 MED ORDER — ALFUZOSIN HCL ER 10 MG PO TB24
10.0000 mg | ORAL_TABLET | Freq: Every day | ORAL | Status: DC
  Administered 2019-10-22: 10 mg via ORAL
  Filled 2019-10-21: qty 1

## 2019-10-21 SURGICAL SUPPLY — 16 items
BALLN SAPPHIRE 2.0X12 (BALLOONS) ×2
BALLOON SAPPHIRE 2.0X12 (BALLOONS) IMPLANT
CATH INFINITI 5FR ANG PIGTAIL (CATHETERS) ×1 IMPLANT
CATH OPTITORQUE TIG 4.0 5F (CATHETERS) ×1 IMPLANT
CATH VISTA GUIDE 6FR XB3.5 (CATHETERS) ×1 IMPLANT
DEVICE RAD COMP TR BAND LRG (VASCULAR PRODUCTS) ×1 IMPLANT
GLIDESHEATH SLEND A-KIT 6F 22G (SHEATH) ×1 IMPLANT
GUIDEWIRE INQWIRE 1.5J.035X260 (WIRE) IMPLANT
INQWIRE 1.5J .035X260CM (WIRE) ×2
KIT HEART LEFT (KITS) ×2 IMPLANT
PACK CARDIAC CATHETERIZATION (CUSTOM PROCEDURE TRAY) ×2 IMPLANT
STENT RESOLUTE ONYX 2.25X12 (Permanent Stent) ×1 IMPLANT
TRANSDUCER W/STOPCOCK (MISCELLANEOUS) ×2 IMPLANT
TUBING CIL FLEX 10 FLL-RA (TUBING) ×2 IMPLANT
WIRE ASAHI PROWATER 180CM (WIRE) ×1 IMPLANT
WIRE HI TORQ VERSACORE-J 145CM (WIRE) ×1 IMPLANT

## 2019-10-21 NOTE — Interval H&P Note (Signed)
Cath Lab Visit (complete for each Cath Lab visit)  Clinical Evaluation Leading to the Procedure:   ACS: Yes.    Non-ACS:    Anginal Classification: CCS II  Anti-ischemic medical therapy: Minimal Therapy (1 class of medications)  Non-Invasive Test Results: No non-invasive testing performed  Prior CABG: No previous CABG      History and Physical Interval Note:  10/21/2019 4:52 PM  Dylan Lane  has presented today for surgery, with the diagnosis of nstemi.  The various methods of treatment have been discussed with the patient and family. After consideration of risks, benefits and other options for treatment, the patient has consented to  Procedure(s): LEFT HEART CATH AND CORONARY ANGIOGRAPHY (N/A) as a surgical intervention.  The patient's history has been reviewed, patient examined, no change in status, stable for surgery.  I have reviewed the patient's chart and labs.  Questions were answered to the patient's satisfaction.     Quay Burow
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
## 2019-10-21 NOTE — Telephone Encounter (Signed)
The patient called in wanting to make an appointment with [facility] for chest tightness and numbness in left arm.  I transferred the patient to nurse triage.  Waiting for triage notes

## 2019-10-21 NOTE — ED Triage Notes (Signed)
Pt reports chest tightness and left arm pain that started while in church yesterday. Also reports fatigue. Pt a.o, nad noted.

## 2019-10-21 NOTE — Progress Notes (Signed)
[facility] for Heparin Indication: chest pain/ACS  Allergies  Allergen Reactions  . Amoxicillin Hives         Patient Measurements: Height: 5\' 7"  (170.2 cm) Weight: 89.8 kg (198 lb) IBW/kg (Calculated) : 66.1 Heparin Dosing Weight: 84.8 kg  Vital Signs: Temp: 98.3 F (36.8 C) (10/18 0941) Temp Source: Oral (10/18 0941) BP: 142/91 (10/18 1145) Pulse Rate: 73 (10/18 1145)  Labs: Recent Labs    10/21/19 0945 10/21/19 1128  HGB 15.3  --   HCT 44.5  --   PLT 248  --   CREATININE 1.16  --   TROPONINIHS 200* 217*    Estimated Creatinine Clearance: 67 mL/min (by C-G formula based on SCr of 1.16 mg/dL).   Medical History: Past Medical History:  Diagnosis Date  . Colon polyps    3/06 and 2009:needs repeat in 3 yrs(3/12)  . GERD (gastroesophageal reflux disease) 2017  . Hyperlipidemia   . Hypertension   . Pre-diabetes    with fasting glucose of 110(2/09)  . Rectal bleeding    History of    Medications:  Scheduled:  . heparin  4,000 Units Intravenous Once  . heparin  4,000 Units Intravenous Once  . sodium chloride flush  3 mL Intravenous Q12H    Assessment: Patient is a 70 yom that presented the ED with chest pain. Patient had an elevated trop that is trending upward. At this time pharmacy has been asked dose heparin at this time.   Goal of Therapy:  Heparin level 0.3-0.7 units/ml Monitor platelets by anticoagulation protocol: Yes   Plan:  - Heparin bolus 4000 unit IV x 1 dose - Heparin drip @ 1000 units/hr - Heparin level in ~ 6 hours  - Monitor patient for s/s of bleeding and CBC while on heparin   Duanne Limerick PharmD. BCPS  10/21/2019,2:36 PM
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
## 2019-10-22 ENCOUNTER — Other Ambulatory Visit (HOSPITAL_COMMUNITY): Payer: Self-pay | Admitting: Cardiovascular Disease

## 2019-10-22 ENCOUNTER — Encounter (HOSPITAL_COMMUNITY): Payer: Self-pay | Admitting: Cardiovascular Disease

## 2019-10-22 DIAGNOSIS — R7303 Prediabetes: Secondary | ICD-10-CM | POA: Diagnosis not present

## 2019-10-22 DIAGNOSIS — I214 Non-ST elevation (NSTEMI) myocardial infarction: Secondary | ICD-10-CM | POA: Diagnosis not present

## 2019-10-22 DIAGNOSIS — E785 Hyperlipidemia, unspecified: Secondary | ICD-10-CM | POA: Diagnosis not present

## 2019-10-22 DIAGNOSIS — Z79899 Other long term (current) drug therapy: Secondary | ICD-10-CM | POA: Diagnosis not present

## 2019-10-22 DIAGNOSIS — I2510 Atherosclerotic heart disease of native coronary artery without angina pectoris: Secondary | ICD-10-CM | POA: Diagnosis not present

## 2019-10-22 DIAGNOSIS — Z87891 Personal history of nicotine dependence: Secondary | ICD-10-CM | POA: Diagnosis not present

## 2019-10-22 DIAGNOSIS — Z7982 Long term (current) use of aspirin: Secondary | ICD-10-CM | POA: Diagnosis not present

## 2019-10-22 DIAGNOSIS — Z20822 Contact with and (suspected) exposure to covid-19: Secondary | ICD-10-CM | POA: Diagnosis not present

## 2019-10-22 DIAGNOSIS — I10 Essential (primary) hypertension: Secondary | ICD-10-CM | POA: Diagnosis not present

## 2019-10-22 LAB — CBC
HCT: 43.4 % (ref 39.0–52.0)
Hemoglobin: 15.2 g/dL (ref 13.0–17.0)
MCH: 28.2 pg (ref 26.0–34.0)
MCHC: 35 g/dL (ref 30.0–36.0)
MCV: 80.5 fL (ref 80.0–100.0)
Platelets: 243 10*3/uL (ref 150–400)
RBC: 5.39 MIL/uL (ref 4.22–5.81)
RDW: 13.2 % (ref 11.5–15.5)
WBC: 7.2 10*3/uL (ref 4.0–10.5)
nRBC: 0 % (ref 0.0–0.2)

## 2019-10-22 LAB — BASIC METABOLIC PANEL
Anion gap: 10 (ref 5–15)
BUN: 8 mg/dL (ref 8–23)
CO2: 24 mmol/L (ref 22–32)
Calcium: 9.6 mg/dL (ref 8.9–10.3)
Chloride: 104 mmol/L (ref 98–111)
Creatinine, Ser: 1.15 mg/dL (ref 0.61–1.24)
GFR, Estimated: >60 mL/min (ref >60)
Glucose, Bld: 123 mg/dL — ABNORMAL HIGH (ref 70–99)
Potassium: 3.6 mmol/L (ref 3.5–5.1)
Sodium: 138 mmol/L (ref 135–145)

## 2019-10-22 MED ORDER — CYCLOBENZAPRINE HCL 10 MG PO TABS: 10.0000 mg | ORAL_TABLET | Freq: Every evening | ORAL | Status: DC | PRN

## 2019-10-22 MED ORDER — METOPROLOL TARTRATE 25 MG PO TABS
25.0000 mg | ORAL_TABLET | Freq: Two times a day (BID) | ORAL | 3 refills | Status: DC
Start: 2019-10-22 — End: 2019-12-24

## 2019-10-22 MED ORDER — TICAGRELOR 90 MG PO TABS
90.0000 mg | ORAL_TABLET | Freq: Two times a day (BID) | ORAL | 3 refills | Status: DC
Start: 2019-10-22 — End: 2019-11-06

## 2019-10-22 MED ORDER — NITROGLYCERIN 0.4 MG SL SUBL: 0.4000 mg | SUBLINGUAL_TABLET | SUBLINGUAL | 0 refills | Status: DC | PRN

## 2019-10-22 MED ORDER — ATORVASTATIN CALCIUM 80 MG PO TABS
80.0000 mg | ORAL_TABLET | Freq: Every day | ORAL | 3 refills | Status: DC
Start: 2019-10-23 — End: 2020-06-04

## 2019-10-22 MED ORDER — METOPROLOL TARTRATE 25 MG PO TABS
25.0000 mg | ORAL_TABLET | Freq: Two times a day (BID) | ORAL | 3 refills | Status: DC
Start: 2019-10-22 — End: 2019-10-22

## 2019-10-22 MED ORDER — ATORVASTATIN CALCIUM 80 MG PO TABS
80.0000 mg | ORAL_TABLET | Freq: Every day | ORAL | 3 refills | Status: DC
Start: 2019-10-23 — End: 2019-10-22

## 2019-10-22 MED ORDER — TICAGRELOR 90 MG PO TABS
90.0000 mg | ORAL_TABLET | Freq: Two times a day (BID) | ORAL | 3 refills | Status: DC
Start: 2019-10-22 — End: 2019-10-22

## 2019-10-22 MED ORDER — METOPROLOL TARTRATE 25 MG PO TABS
25.0000 mg | ORAL_TABLET | Freq: Two times a day (BID) | ORAL | Status: DC
  Administered 2019-10-22: 25 mg via ORAL
  Filled 2019-10-22: qty 1

## 2019-10-22 MED FILL — BRILINTA 90 MG TABLET: 90 | 30 days supply | Qty: 60 | Fill #0

## 2019-10-22 NOTE — Discharge Summary (Addendum)
The patient has been seen in conjunction with Vin Bhagat, PAC. All aspects of care have been considered and discussed. The patient has been personally interviewed, examined, and all clinical data has been reviewed.   Digital images reviewed.  Distal circumflex with nice stent result.  Radial cath site is unremarkable.  He is now on amlodipine and low-dose beta-blocker therapy for blood pressure control.  High intensity statin therapy will be continued.  Consider phase 2 cardiac rehab.  Plan discharge today with 7 to 10-day TOC.  Will need an outpatient echo.  After 1 month can decrease intensity of dual antiplatelet therapy from ticagrelor to Plavix.   Discharge Summary    Patient ID: Dylan Lane MRN: [id number]; DOB: May 16, 1950  Admit date: 10/21/2019 Discharge date: 10/22/2019  Primary Care Provider: [name], NP  Primary Cardiologist: Dr. Saunders Revel  Discharge Diagnoses    Active Problems:   NSTEMI (non-ST elevated myocardial infarction) ([place])   HTN   HLD   Pre-diabetics   CAD  Diagnostic Studies/Procedures    CORONARY STENT INTERVENTION  LEFT HEART CATH AND CORONARY ANGIOGRAPHY  Conclusion    Prox LAD to Mid LAD lesion is 50% stenosed.  Dist Cx lesion is 99% stenosed.  Mid RCA lesion is 30% stenosed.  A drug-eluting stent was successfully placed using a [place] [id number].  Post intervention, there is a 0% residual stenosis.  The left ventricular systolic function is normal.  LV end diastolic pressure is normal.  The left ventricular ejection fraction is 55-65% by visual estimate.  IMPRESSION: Successful distal circumflex obtuse marginal branch PCI drug-eluting stenting using a resolute Onyx 2.25 mm x 12 mm long drug-eluting stent.  He did have residual 50% calcified proximal LAD stenosis just beyond the takeoff of a high diagonal branch which I think can be treated medically.  His LV function was normal.  He will need cardiac risk  factor modification including guideline directed optimal medical therapy with high-dose statin therapy and uninterrupted dual antiplatelet therapy for 12 years.  He left lab in stable condition.   Diagnostic Dominance: Right  Intervention    History of Present Illness     Dylan Lane is a 69 y.o. male with hx of HTN, HLD, GERD and pre-diabetes presented for chest pain evaluation and found to have elevated troponin.   Established care with Dr. Saunders Revel in 2018 for chest pain. Followed up ETT showed fair exercise tolerance without chest pain or concerning EKG findings, consistent with a low-risk study.  Dylan Lane had sudden onset chest tightness radiating to his left arm  while attending church.  No associated diaphoresis shortness of breath or nausea/vomiting.  He felt unusual fatigue.  He went to [place] drop off his nephew and took 2 Tylenol with improvement of his symptoms.  He come back home and went to bed with mild chest discomfort. Next morning he woke up and felt okay and his PCP who advised to come to ER for further evaluation.  Remote tobacco smoking for 13 years, quit in 1990s.  No alcohol abuse or illicit drug use.  Denies family history of CAD.  No regular exercise but he was walking occasionally without chest discomfort up until about a month ago then stopped suddenly.  Hs-Troponin 217>>200. Chest X-ray - normal K 3.6 Scr 1.16 Respiratory panel negative for influenza and COVID Has received both COVID vaccine shot  Hospital Course     Consultants: None  1. NSTEMI - Hs-troponin peaked at 387. Treated  with IV heparin. Cath showed 99% distal Cx stenosis s/p PCI with DES. Medical therapy for residual 50% calcified proximal LAD stenosis. LV function 55-65% by visual estimate. Echo can be done as outpatient to better evaluate is LV function per MD. No overnight complications. Ambulated without chest discomfort this morning. CRP II as outpatient.  Continue DAPT for 12  months. Continue statin and BB.   2. HTN - BP stable - Continue home amlodipine - Added metoprolol 73m BID  3. HLD - 07/16/2019: Cholesterol 168; HDL 44; LDL Cholesterol (Calc) 108; Triglycerides 69  - Pravastatin changed to Lipitor 852mthis admission - Lipid panel and LFTS in 8 weeks.   The patient been seen by Dr. SmTamala Juliantoday and deemed ready for discharge home. All follow-up appointments have been scheduled. Discharge medications are listed below.   Did the patient have an acute coronary syndrome (MI, NSTEMI, STEMI, etc) this admission?:  Yes                               AHA/ACC Clinical Performance & Quality Measures: 7. Aspirin prescribed? - Yes 8. ADP Receptor Inhibitor (Plavix/Clopidogrel, Brilinta/Ticagrelor or Effient/Prasugrel) prescribed (includes medically managed patients)? - Yes 9. Beta Blocker prescribed? - Yes 10. High Intensity Statin (Lipitor 40-8011mr Crestor 20-12m2mrescribed? - Yes 11. EF assessed during THIS hospitalization? - No - LVEF normal by cath. echo as outpatient per MD 12. For EF <40%, was ACEI/ARB prescribed? - Not Applicable (EF >/= 40%)07%. For EF <40%, Aldosterone Antagonist (Spironolactone or Eplerenone) prescribed? - Not Applicable (EF >/= 40%)62%. Cardiac Rehab Phase II ordered (including medically managed patients)? - Yes   _____________  Discharge Vitals Blood pressure 136/78, pulse 96, temperature 98.2 F (36.8 C), temperature source Oral, resp. rate 18, height _0  (1.702 m), weight 98.6 kg, SpO2 98 %.  Filed Weights   10/21/19 1205 10/21/19 2136 10/22/19 0500  Weight: 89.8 kg 98.7 kg 98.6 kg    Labs & Radiologic Studies    CBC Recent Labs    10/21/19 0945 10/22/19 0619  WBC 5.4 7.2  HGB 15.3 15.2  HCT 44.5 43.4  MCV 81.8 80.5  PLT 248 243 263asic Metabolic Panel Recent Labs    10/21/19 0945 10/22/19 0619  NA 138 138  K 3.6 3.6  CL 104 104  CO2 24 24  GLUCOSE 109* 123*  BUN 7* 8  CREATININE 1.16 1.15    CALCIUM 9.8 9.6   High Sensitivity Troponin:   Recent Labs  Lab 10/21/19 0945 10/21/19 1128 10/21/19 1504 10/21/19 2141  TROPONINIHS 200* 217* 203* 387*    _____________  DG Chest 2 View  Result Date: 10/21/2019 CLINICAL DATA:  Chest tightness extending to the left arm. EXAM: CHEST - 2 VIEW COMPARISON:  01/27/2016 FINDINGS: Heart size is normal. Mediastinal shadows are normal. The lungs are clear. No bronchial thickening. No infiltrate, mass, effusion or collapse. Pulmonary vascularity is normal. No significant bony abnormality. IMPRESSION: Normal chest. Electronically Signed   By: MarkNelson Chimes.   On: 10/21/2019 09:56   CARDIAC CATHETERIZATION  Result Date: 10/21/2019  Prox LAD to Mid LAD lesion is 50% stenosed.  Dist Cx lesion is 99% stenosed.  Mid RCA lesion is 30% stenosed.  A drug-eluting stent was successfully placed using a [id number] intervention, there is a 0% residual stenosis.  The left ventricular systolic function is normal.  LV  end diastolic pressure is normal.  The left ventricular ejection fraction is 55-65% by visual estimate.  Dylan Lane is a 69 y.o. male  [id number] LOCATION:  FACILITY: [facility] PHYSICIAN: Quay Burow, M.D. 1950-06-18 DATE OF PROCEDURE:  10/21/2019 DATE OF DISCHARGE: CARDIAC CATHETERIZATION / PCI DES LCX History obtained from chart review.Dylan Lane is a 69 y.o. male with hx of HTN, HLD, GERD and pre-diabetes presented for chest pain evaluation and found to have elevated troponin. PROCEDURE DESCRIPTION: The patient was brought to the second floor [facility]iac cath lab in the postabsorptive state. He was premedicated with IV Versed and fentanyl. His right wristwas prepped and shaved in usual sterile fashion. Xylocaine 1% was used for local anesthesia. A 6 French sheath was inserted into the right radial artery using standard Seldinger technique. The patient received 4500 units  of heparin  intravenously.  A 5 [place] TIG catheter and pigtail catheters were used for selective coronary angiography and left ventriculography respectively.  Isovue dye was used for the entirety of the case.  Retrograde aorta, ventricular and pullback pressures were recorded.  Radial cocktail was administered via the SideArm sheath. The patient received an additional 5500's of heparin (total 10,000) with an ACT of greater than 300.  He received 180 mg of p.o. Brilinta.  Isovue dye was used for the entirety of the intervention.  Retroaortic pressures monitored in the case.  Using a 6 [place] XB 3.5 cm guide catheter along with 0.14 [place] guidewire and a 2 mm x 12 mm balloon the distal circumflex was wired with some difficulty using the balloon is back up and predilated.  A 2.25 x 12 mm long resolute Onyx drug-eluting stent was then carefully positioned across the diseased segment and deployed at 14 atm (2.3 mm) resulting reduction of a 99% distal circumflex obtuse marginal branch stenosis to 0% residual.  Patient tolerated procedure well.  Guidewire and catheter were removed.  The sheath was removed and a TR band was placed on the right wrist to achieve patent hemostasis.   Successful distal circumflex obtuse marginal branch PCI drug-eluting stenting using a resolute Onyx 2.25 mm x 12 mm long drug-eluting stent.  He did have residual 50% calcified proximal LAD stenosis just beyond the takeoff of a high diagonal branch which I think can be treated medically.  His LV function was normal.  He will need cardiac risk factor modification including guideline directed optimal medical therapy with high-dose statin therapy and uninterrupted dual antiplatelet therapy for 12 years.  He left lab in stable condition. Quay Burow. MD, [facility] 10/21/2019 5:57 PM   Disposition   Pt is being discharged home today in good condition.  Follow-up Plans & Appointments     Follow-up Information    [facility], [place], [place]. Go on  11/05/2019.   Specialty: Cardiology Why: _0 :45am virtual office visit for hospital follow up  Contact information: [place] [address] [place] [address] [phone]              Discharge Instructions    Amb Referral to Cardiac Rehabilitation   Complete by: As directed    Diagnosis:  NSTEMI Coronary Stents     After initial evaluation and assessments completed: Virtual Based Care may be provided alone or in conjunction with Phase 2 Cardiac Rehab based on patient barriers.: Yes   Diet - low sodium heart healthy   Complete by: As directed    Discharge instructions   Complete by: As directed  NO HEAVY LIFTING (>10lbs) X 2 WEEKS. NO SEXUAL ACTIVITY X 2 WEEKS. NO DRIVING X 1 WEEK. NO SOAKING BATHS, HOT TUBS, POOLS, ETC., X 7 DAYS.   Increase activity slowly   Complete by: As directed       Discharge Medications   Allergies as of 10/22/2019      Reactions   Amoxicillin Hives         Medication List    STOP taking these medications   pravastatin 40 MG tablet Commonly known as: PRAVACHOL     TAKE these medications   acetaminophen 325 MG tablet Commonly known as: TYLENOL Take 650 mg by mouth every 6 (six) hours as needed for mild pain or headache.   alfuzosin 10 MG 24 hr tablet Commonly known as: UROXATRAL Take 10 mg by mouth daily with breakfast.   amLODipine 10 MG tablet Commonly known as: NORVASC Take 1 tablet by mouth once daily   aspirin 81 MG EC tablet Take 1 tablet (81 mg total) by mouth daily.   atorvastatin 80 MG tablet Commonly known as: LIPITOR Take 1 tablet (80 mg total) by mouth daily. Start taking on: October 23, 2019   cyclobenzaprine 10 MG tablet Commonly known as: FLEXERIL Take 1 tablet (10 mg total) by mouth at bedtime as needed for muscle spasms.   metoprolol tartrate 25 MG tablet Commonly known as: LOPRESSOR Take 1 tablet (25 mg total) by mouth 2 (two) times daily.   nitroGLYCERIN 0.4 MG SL tablet Commonly known as:  NITROSTAT Place 1 tablet (0.4 mg total) under the tongue every 5 (five) minutes x 3 doses as needed for chest pain.   pantoprazole 40 MG tablet Commonly known as: PROTONIX Take 1 tablet (40 mg total) by mouth daily.   ticagrelor 90 MG Tabs tablet Commonly known as: BRILINTA Take 1 tablet (90 mg total) by mouth 2 (two) times daily.          Outstanding Labs/Studies   Lipid panel and LFTs in 8 weeks   Duration of Discharge Encounter   Greater than 30 minutes including physician time.  Jarrett Soho, PA 10/22/2019, 9:47 AM

## 2019-10-22 NOTE — TOC Benefit Eligibility Note (Signed)
Transition of Care [facility]) Benefit Eligibility Note    Patient Details  Name: Dylan Lane MRN: [id number] Date of Birth: Dec 28, 1950   Medication/Dose: Brilinta 90 mg bid 30 day supply  Covered?: Yes  Tier:  (Tier 4)  Prescription Coverage [facility]erred [facility]: [facility]  Spoke with Person/Company/Phone Number:: May E. W/[employer] Ph# [phone]  Co-Pay: $100.00  Prior Approval: No  Deductible: Unmet       Shelda Altes Phone Number: 10/22/2019, 1:28 PM

## 2019-10-22 NOTE — Care Management Obs Status (Signed)
[facility] NOTIFICATION   Patient Details  Name: Dylan Lane MRN: [id number] Date of Birth: 1950/11/10   Medicare Observation Status Notification Given:  Yes    Dawayne Patricia, RN 10/22/2019, 11:02 AM

## 2019-10-22 NOTE — Care Management CC44 (Signed)
Condition Code 44 Documentation Completed  Patient Details  Name: Merril Nagy MRN: [id number] Date of Birth: 07/14/1950   Condition Code 44 given:  Yes Patient signature on Condition Code 44 notice:  Yes Documentation of 2 MD's agreement:  Yes Code 44 added to claim:  Yes    Dawayne Patricia, RN 10/22/2019, 11:02 AM

## 2019-10-22 NOTE — Progress Notes (Signed)
CARDIAC REHAB PHASE I   PRE:  Rate/Rhythm: 85 SR  BP:  Supine: 136/78  Sitting:   Standing:    SaO2: 98%RA  MODE:  Ambulation: 470 ft   POST:  Rate/Rhythm: 102 ST  BP:  Supine:   Sitting: 147/91  Standing:    SaO2: 98%RA 2951-8841 Pt walked 470 ft on RA with steady gait and tolerated well. No CP. MI education completed with pt who voiced understanding. Stressed several times the importance of brilinta with stent. Reviewed NTG use, heart healthy food choices, walking for exercise, MI restrictions and CRP 2. Referred to [place] CRP 2.   Graylon Good, RN BSN  10/22/2019 9:12 AM

## 2019-10-23 ENCOUNTER — Telehealth: Payer: Self-pay

## 2019-10-23 NOTE — Telephone Encounter (Signed)
Contacted patient and we reviewed the discharge instructions:   Discharge Instructions    Amb Referral to Cardiac Rehabilitation   Complete by: As directed    Diagnosis:  NSTEMI Coronary Stents     After initial evaluation and assessments completed: Virtual Based Care may be provided alone or in conjunction with Phase 2 Cardiac Rehab based on patient barriers.: Yes   Diet - low sodium heart healthy   Complete by: As directed    Discharge instructions   Complete by: As directed    NO HEAVY LIFTING (>10lbs) X 2 WEEKS. NO SEXUAL ACTIVITY X 2 WEEKS. NO DRIVING X 1 WEEK. NO SOAKING BATHS, HOT TUBS, POOLS, ETC., X 7 DAYS.   Increase activity slowly        Patient has follow up with Cards on 11/05/2019.   Patient is feeling well and not having any issues at this time.

## 2019-10-23 NOTE — Telephone Encounter (Signed)
Patient called in wanting to let Dylan Lane know that he had a heart attack on Sunday 10/20/19 and wanted to update his Dr. He is wanting either the Dr or nurse to give him a call back     Please advise

## 2019-10-23 NOTE — Telephone Encounter (Signed)
Pt went to the ED and admitted.

## 2019-10-24 ENCOUNTER — Telehealth: Payer: Self-pay | Admitting: Cardiovascular Disease

## 2019-10-24 NOTE — Telephone Encounter (Signed)
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 Cath with stent was done 10/18 , Will forward to Dr Gwenlyn Found for recommendations .Adonis Housekeeper

## 2019-10-24 NOTE — Telephone Encounter (Signed)
New message:     Pt wants to know when can he go back to work?  He had his procedure on 10-22-19.

## 2019-10-25 ENCOUNTER — Other Ambulatory Visit: Payer: Self-pay | Admitting: Adult Health

## 2019-10-25 ENCOUNTER — Telehealth: Payer: Self-pay

## 2019-10-25 ENCOUNTER — Other Ambulatory Visit: Payer: Self-pay | Admitting: Cardiovascular Disease

## 2019-10-25 MED ORDER — PANTOPRAZOLE SODIUM 40 MG PO TBEC
40.0000 mg | DELAYED_RELEASE_TABLET | Freq: Every day | ORAL | 3 refills | Status: DC
Start: 2019-10-25 — End: 2019-12-24

## 2019-10-25 NOTE — Telephone Encounter (Signed)
The patient has been made aware that Dr. Gwenlyn Found is currently out of the office. 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   Cardiac cath with stents was completed on 10/18.

## 2019-10-25 NOTE — Telephone Encounter (Signed)
Pt need a refill on his med for acid reflux sent to  [facility], [address] Phone:  [phone]  Fax:  [phone]

## 2019-10-25 NOTE — Telephone Encounter (Signed)
Patient calling back to follow up.

## 2019-10-25 NOTE — Telephone Encounter (Signed)
Follow up:     Patient calling to follow up on the status about going back to work. Please advise. Dr. Gwenlyn Found saw patient in the hospital.

## 2019-10-25 NOTE — Telephone Encounter (Signed)
Duplicate medication

## 2019-10-25 NOTE — Telephone Encounter (Signed)
*  STAT* If patient is at the pharmacy, call can be transferred to refill team.   1. Which medications need to be refilled? (please list name of each medication and dose if known) Pantoprazole   2. Which pharmacy/location (including street and city if local pharmacy) is medication to be sent to? [facility]   3. Do they need a 30 day or 90 day supply? 90 patient saw dr. Gwenlyn Found in the hospital.

## 2019-10-25 NOTE — Telephone Encounter (Signed)
Patient called in needing a refill on medication   pantoprazole (PROTONIX) 40 MG tablet   [place], [place] - [place]

## 2019-10-25 NOTE — Telephone Encounter (Signed)
Refill has been sent as requested.

## 2019-10-28 NOTE — Telephone Encounter (Signed)
Agreed.  JJB

## 2019-10-28 NOTE — Telephone Encounter (Signed)
The patient has been made aware and verbalized his understanding. Appointment is 11/05/19

## 2019-10-30 ENCOUNTER — Telehealth: Payer: Self-pay | Admitting: Cardiovascular Disease

## 2019-10-30 NOTE — Telephone Encounter (Signed)
Per review of hospital discharge summary, he should NOT drive x7 days. He has been out of hospital 8 days. OK to drive per guidelines from [facility] discharge summary  Patient is concerned because he is self-employed, he is getting behind on his bills because he has been out of work since his hospitalization. He is not sure if he has or is eligible for short-term disability. He is not cleared to return to work until after follow up next week   Will defer to Education officer, museum team for assistance

## 2019-10-30 NOTE — Telephone Encounter (Signed)
Patient would like to know if he is okay to drive. He states on the paper he was given last Tuesday, it said he could drive in a week.

## 2019-11-01 ENCOUNTER — Telehealth: Payer: Self-pay | Admitting: Cardiovascular Disease

## 2019-11-01 NOTE — Telephone Encounter (Signed)
Spoke with Dylan Lane who report since starting Metoprolol, he has developed symptoms of dizziness when bending over. Dylan Lane also report an occasional headache. Dylan Lane noted BP yesterday was 107/70 HR 70. Dylan Lane denies any other symptoms.  Nurse will route message to MD for recommends but in the meantime advised to change position slowly and continue to monitor BP and HR. Dylan Lane verbalized understanding.

## 2019-11-01 NOTE — Telephone Encounter (Signed)
Patient states when he bends over putting on socks he get lightheaded and a headache. He would like to know if he can take 2 tylenol for it.

## 2019-11-03 NOTE — Telephone Encounter (Signed)
I don't think I've seen this pt in the office

## 2019-11-04 ENCOUNTER — Other Ambulatory Visit: Payer: Self-pay

## 2019-11-04 ENCOUNTER — Emergency Department (HOSPITAL_COMMUNITY): Payer: Medicare HMO

## 2019-11-04 ENCOUNTER — Encounter (HOSPITAL_COMMUNITY): Payer: Self-pay | Admitting: *Deleted

## 2019-11-04 ENCOUNTER — Emergency Department (HOSPITAL_COMMUNITY)
Admission: EM | Admit: 2019-11-04 | Discharge: 2019-11-04 | Disposition: A | Discharge status: Home / Self Care | Payer: Medicare HMO | Attending: Emergency Medicine | Admitting: Emergency Medicine

## 2019-11-04 DIAGNOSIS — R112 Nausea with vomiting, unspecified: Secondary | ICD-10-CM | POA: Diagnosis not present

## 2019-11-04 DIAGNOSIS — Z87891 Personal history of nicotine dependence: Secondary | ICD-10-CM | POA: Diagnosis not present

## 2019-11-04 DIAGNOSIS — Z20822 Contact with and (suspected) exposure to covid-19: Secondary | ICD-10-CM | POA: Diagnosis not present

## 2019-11-04 DIAGNOSIS — Z955 Presence of coronary angioplasty implant and graft: Secondary | ICD-10-CM | POA: Insufficient documentation

## 2019-11-04 DIAGNOSIS — Z7982 Long term (current) use of aspirin: Secondary | ICD-10-CM | POA: Insufficient documentation

## 2019-11-04 DIAGNOSIS — K117 Disturbances of salivary secretion: Secondary | ICD-10-CM | POA: Diagnosis not present

## 2019-11-04 DIAGNOSIS — I10 Essential (primary) hypertension: Secondary | ICD-10-CM | POA: Insufficient documentation

## 2019-11-04 DIAGNOSIS — Z79899 Other long term (current) drug therapy: Secondary | ICD-10-CM | POA: Diagnosis not present

## 2019-11-04 DIAGNOSIS — R531 Weakness: Secondary | ICD-10-CM | POA: Diagnosis not present

## 2019-11-04 DIAGNOSIS — R059 Cough, unspecified: Secondary | ICD-10-CM | POA: Insufficient documentation

## 2019-11-04 DIAGNOSIS — Z86010 Personal history of colonic polyps: Secondary | ICD-10-CM | POA: Insufficient documentation

## 2019-11-04 DIAGNOSIS — R519 Headache, unspecified: Secondary | ICD-10-CM | POA: Diagnosis not present

## 2019-11-04 DIAGNOSIS — R5381 Other malaise: Secondary | ICD-10-CM | POA: Diagnosis not present

## 2019-11-04 LAB — TROPONIN I (HIGH SENSITIVITY): Troponin I (High Sensitivity): 6 ng/L (ref <18)

## 2019-11-04 LAB — URINALYSIS, ROUTINE W REFLEX MICROSCOPIC
Bilirubin Urine: NEGATIVE
Glucose, UA: NEGATIVE mg/dL
Hgb urine dipstick: NEGATIVE
Ketones, ur: NEGATIVE mg/dL
Leukocytes,Ua: NEGATIVE
Nitrite: NEGATIVE
Protein, ur: NEGATIVE mg/dL
Specific Gravity, Urine: 1.019 (ref 1.005–1.030)
pH: 5 (ref 5.0–8.0)

## 2019-11-04 LAB — COMPREHENSIVE METABOLIC PANEL
ALT: 22 U/L (ref 0–44)
AST: 16 U/L (ref 15–41)
Albumin: 4.1 g/dL (ref 3.5–5.0)
Alkaline Phosphatase: 91 U/L (ref 38–126)
Anion gap: 9 (ref 5–15)
BUN: 14 mg/dL (ref 8–23)
CO2: 27 mmol/L (ref 22–32)
Calcium: 10 mg/dL (ref 8.9–10.3)
Chloride: 106 mmol/L (ref 98–111)
Creatinine, Ser: 1.37 mg/dL — ABNORMAL HIGH (ref 0.61–1.24)
GFR, Estimated: 57 mL/min — ABNORMAL LOW (ref >60)
Glucose, Bld: 122 mg/dL — ABNORMAL HIGH (ref 70–99)
Potassium: 3.6 mmol/L (ref 3.5–5.1)
Sodium: 142 mmol/L (ref 135–145)
Total Bilirubin: 0.7 mg/dL (ref 0.3–1.2)
Total Protein: 6.9 g/dL (ref 6.5–8.1)

## 2019-11-04 LAB — CBC
HCT: 42.4 % (ref 39.0–52.0)
Hemoglobin: 14.6 g/dL (ref 13.0–17.0)
MCH: 28.2 pg (ref 26.0–34.0)
MCHC: 34.4 g/dL (ref 30.0–36.0)
MCV: 82 fL (ref 80.0–100.0)
Platelets: 235 10*3/uL (ref 150–400)
RBC: 5.17 MIL/uL (ref 4.22–5.81)
RDW: 13.3 % (ref 11.5–15.5)
WBC: 9.1 10*3/uL (ref 4.0–10.5)
nRBC: 0 % (ref 0.0–0.2)

## 2019-11-04 LAB — RESPIRATORY PANEL BY RT PCR (FLU A&B, COVID)
Influenza A by PCR: NEGATIVE
Influenza B by PCR: NEGATIVE
SARS Coronavirus 2 by RT PCR: NEGATIVE

## 2019-11-04 LAB — LIPASE, BLOOD: Lipase: 24 U/L (ref 11–51)

## 2019-11-04 MED ORDER — TICAGRELOR 90 MG PO TABS
90.0000 mg | ORAL_TABLET | Freq: Two times a day (BID) | ORAL | Status: DC
  Administered 2019-11-04: 90 mg via ORAL
  Filled 2019-11-04: qty 1

## 2019-11-04 MED ORDER — ATORVASTATIN CALCIUM 80 MG PO TABS
80.0000 mg | ORAL_TABLET | Freq: Every day | ORAL | Status: DC
  Administered 2019-11-04: 80 mg via ORAL
  Filled 2019-11-04: qty 2

## 2019-11-04 MED ORDER — METOPROLOL TARTRATE 25 MG PO TABS
25.0000 mg | ORAL_TABLET | Freq: Two times a day (BID) | ORAL | Status: DC
  Administered 2019-11-04: 25 mg via ORAL
  Filled 2019-11-04: qty 1

## 2019-11-04 MED ORDER — AMLODIPINE BESYLATE 5 MG PO TABS
10.0000 mg | ORAL_TABLET | Freq: Every day | ORAL | Status: DC
  Administered 2019-11-04: 10 mg via ORAL
  Filled 2019-11-04: qty 2

## 2019-11-04 MED ORDER — SODIUM CHLORIDE 0.9 % IV BOLUS
500.0000 mL | Freq: Once | INTRAVENOUS | Status: AC
  Administered 2019-11-04: 500 mL via INTRAVENOUS

## 2019-11-04 MED ORDER — ASPIRIN EC 81 MG PO TBEC
81.0000 mg | DELAYED_RELEASE_TABLET | Freq: Every day | ORAL | Status: DC
  Administered 2019-11-04: 81 mg via ORAL
  Filled 2019-11-04: qty 1

## 2019-11-04 NOTE — Telephone Encounter (Signed)
    Pt calling back, he said he is in the hospital today and would like to speak with a nurse. He also following up about his metoprolol

## 2019-11-04 NOTE — ED Triage Notes (Signed)
The pt has had nausea and vomiting all day  Tonight the pt feels like the back of his throat is swollen  Mi 3 weeks ago

## 2019-11-04 NOTE — Telephone Encounter (Signed)
Left message to call back  
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
## 2019-11-04 NOTE — ED Triage Notes (Signed)
The pts throat feels like its is swollen

## 2019-11-04 NOTE — Telephone Encounter (Signed)
Spoke with pt who report he is currently being seen in ER for symptoms. Nurse advised pt to stay if he is already being evaluated and keep scheduled appointment for tomorrow 11/2. Pt verbalized understanding.

## 2019-11-04 NOTE — Discharge Instructions (Addendum)
It was a pleasure taking care of you today.  Your dry mouth may be caused by dehydration and/or anxiety. Flexeril can be seen to cause dry mouth but it is not common. Avoid other medications that can cause this including antihistamines like benadryl. Follow up with your doctor and Cardiologist.  Take care of yourself!  Your chest XR shows small changes--recommend follow up with PCP or if you have worsening cough, shortness of breath or fever may seek reevaluation. At this time I have low suspicion for pneumonia.

## 2019-11-04 NOTE — Telephone Encounter (Signed)
Pt returning call

## 2019-11-04 NOTE — ED Notes (Signed)
Patient transported to x-ray. ?

## 2019-11-05 ENCOUNTER — Telehealth: Payer: Medicare HMO | Admitting: Physician Assistant

## 2019-11-05 ENCOUNTER — Telehealth: Payer: Self-pay | Admitting: Cardiovascular Disease

## 2019-11-05 NOTE — Telephone Encounter (Signed)
Pt c/o medication issue:  1. Name of Medication: metoprolol tartrate (LOPRESSOR) 25 MG tablet; ticagrelor (BRILINTA) 90 MG TABS tablet  2. How are you currently taking this medication (dosage and times per day)? As written   3. Are you having a reaction (difficulty breathing--STAT)? Yes  4. What is your medication issue? feels like he has cottonmouth; hard to swallow; dry mouth; very uncomfortable; has to drink a lot of water to keep mouth moist. Not sure which one of the medications it is above

## 2019-11-05 NOTE — Telephone Encounter (Signed)
Spoke with patient - states dry mouth is making him uncomfortable, went to ED yesterday.    Has not taken cyclobenzaprine in past several days, could be metoprolol but not sure.  Advised that he cut metoprolol dose to 12.5 mg tonight and tomorrow morning and keep appointment with Kerin Ransom in the morning.

## 2019-11-06 ENCOUNTER — Ambulatory Visit (INDEPENDENT_AMBULATORY_CARE_PROVIDER_SITE_OTHER): Payer: Medicare HMO | Admitting: Cardiology

## 2019-11-06 ENCOUNTER — Telehealth: Payer: Self-pay | Admitting: Licensed Clinical Social Worker

## 2019-11-06 ENCOUNTER — Telehealth: Payer: Medicare HMO | Admitting: Physician Assistant

## 2019-11-06 ENCOUNTER — Other Ambulatory Visit: Payer: Self-pay

## 2019-11-06 ENCOUNTER — Encounter: Payer: Self-pay | Admitting: Cardiology

## 2019-11-06 VITALS — BP 133/82 | HR 71 | Ht 67.0 in | Wt 197.6 lb

## 2019-11-06 DIAGNOSIS — Z9861 Coronary angioplasty status: Secondary | ICD-10-CM

## 2019-11-06 DIAGNOSIS — R0600 Dyspnea, unspecified: Secondary | ICD-10-CM | POA: Diagnosis not present

## 2019-11-06 DIAGNOSIS — I2510 Atherosclerotic heart disease of native coronary artery without angina pectoris: Secondary | ICD-10-CM | POA: Diagnosis not present

## 2019-11-06 DIAGNOSIS — I10 Essential (primary) hypertension: Secondary | ICD-10-CM

## 2019-11-06 DIAGNOSIS — E785 Hyperlipidemia, unspecified: Secondary | ICD-10-CM | POA: Diagnosis not present

## 2019-11-06 DIAGNOSIS — I214 Non-ST elevation (NSTEMI) myocardial infarction: Secondary | ICD-10-CM | POA: Diagnosis not present

## 2019-11-06 DIAGNOSIS — I25118 Atherosclerotic heart disease of native coronary artery with other forms of angina pectoris: Secondary | ICD-10-CM | POA: Insufficient documentation

## 2019-11-06 MED ORDER — CLOPIDOGREL BISULFATE 75 MG PO TABS: 75.0000 mg | ORAL_TABLET | Freq: Every day | ORAL | 0 refills | Status: DC

## 2019-11-06 NOTE — Patient Instructions (Addendum)
Medication Instructions:   STOP Brilinta   START Plavix this evening. Take 300 mg (4 tablets) tonight then tomorrow going forward take 1 tablet 75 mg daily.   *If you need a refill on your cardiac medications before your next appointment, please call your pharmacy*  Lab Work: NONE ordered at this time of appointment   If you have labs (blood work) drawn today and your tests are completely normal, you will receive your results only by: Marland Kitchen MyChart Message (if you have MyChart) OR . A paper copy in the mail If you have any lab test that is abnormal or we need to change your treatment, we will call you to review the results.  Testing/Procedures: NONE ordered at this time of appointment   Follow-Up: At [facility], you and your health needs are our priority.  As part of our continuing mission to provide you with exceptional heart care, we have created designated Provider Care Teams.  These Care Teams include your primary Cardiologist (physician) and Advanced Practice Providers (APPs -  Physician Assistants and Nurse Practitioners) who all work together to provide you with the care you need, when you need it.  We recommend signing up for the patient portal called "MyChart".  Sign up information is provided on this After Visit Summary.  MyChart is used to connect with patients for Virtual Visits (Telemedicine).  Patients are able to view lab/test results, encounter notes, upcoming appointments, etc.  Non-urgent messages can be sent to your provider as well.   To learn more about what you can do with MyChart, go to [email or web address].    Your next appointment:   6 week(s)  The format for your next appointment:   In Person  Provider:   Nelva Bush, MD  Other Instructions

## 2019-11-06 NOTE — Progress Notes (Signed)
Heart and Vascular Care Navigation  11/06/2019  Takuya Junior Tones 1950/08/01 [id number]  Reason for Referral: Questions about Short Term Disability/Assistance w/ bills                                                                                                    Assessment:     CSW spoke with Mr. Karl at [phone]. Introduced self, role, reason for call. Pt confirmed home address, phone number, and emergency contact to remain his roommate Collier Flowers [phone]). Pt lives in rented home w/ Mr. Ronnald Ramp. 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 He confirms that he still has [employer] and denies issues with obtaining or affording medications at this time. Pt had called the triage line inquiring about short term disability eligibility. At this time pt denies having signed up for short term disability plan to his knowledge. Explained that if pt feels he may be out of work for a year or more that he may be eligible for disability. Pt agreeable to getting more information on how to apply for that if he feels he cannot return to work.   Pt roommate does work but they are concerned about the light bill and rent for this month- he is interested in information about rental assistance programs. 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  Pt currently has no concerns related to getting food and receives [employer] (food stamps).  He is interested in getting more information about transportation resources. CSW explained I would send those through along with the [place] number.                               HRT/VAS Care Coordination    Patients Home Cardiology Office [facility]   Outpatient Care Team PCP; Social Worker   PCP Name: Beaulah Dinning, NP   Social Worker Name: Westley Hummer, LCSW, [facility]   Living arrangements for the past 2 months [place] with: Roommate   Patient Current Insurance Coverage Managed Medicare   Patient Has Concern With Paying Medical Bills No   Does Patient Have Prescription Coverage? Yes   Home Assistive Devices/Equipment Eyeglasses; Dentures (specify type)      Social History:                                                                             SDOH Screenings   Alcohol Screen:   . Last Alcohol Screening Score (AUDIT): Not on file  Depression (PHQ2-9): Low Risk   . PHQ-2 Score: 3  Financial Resource Strain: Medium Risk  . Difficulty  of Paying Living Expenses: Somewhat hard  Food Insecurity: No Food Insecurity  . Worried About Charity fundraiser in the Last Year: Never true  . Ran Out of Food in the Last Year: Never true  Housing: Low Risk   . Last Housing Risk Score: 0  Physical Activity:   . Days of Exercise per Week: Not on file  . Minutes of Exercise per Session: Not on file  Social Connections:   . Frequency of Communication with Friends and Family: Not on file  . Frequency of Social Gatherings with Friends and Family: Not on file  . Attends Religious Services: Not on file  . Active Member of Clubs or Organizations: Not on file  . Attends Archivist Meetings: Not on file  . Marital Status: Not on file  Stress:   . Feeling of Stress : Not on file  Tobacco Use: Medium Risk  . Smoking Tobacco Use: Former Smoker  . Smokeless Tobacco Use: Never Used  Transportation Needs: Unmet Transportation Needs  . Lack of Transportation (Medical): Yes  . Lack of Transportation (Non-Medical): Yes    SDOH Interventions: Financial Resources:  Financial Strain Interventions: Other (Comment) (HOPE Program)- pt concerned about ensuring he can pay the light bill and rent. He has not received a turn off notice yet at this time.  Food Insecurity:  Food Insecurity Interventions: Intervention Not Indicated (pt already has food stamps). No concerns noted at this time.  Housing Insecurity:   Housing Interventions: Other (Comment) (referral to [employer] for rental assistance)- pt concerned about ensuring that he can pay rent for the month. His roommate works, he is waiting to hear if he can return to his business.   Transportation:   Transportation Interventions: [employer], Other (Comment) (emailed pt with transportation resources)    Follow-up plan:  CSW has sent various resources including crisis resources, transportation resources, [employer], and will ensure pt form sent to [employer] through [place] so we can make sure that he can utilize that service in the future.

## 2019-11-06 NOTE — Assessment & Plan Note (Signed)
dCFX PCI with DES 10/21/2019- residual 50% pLAD, 30% mRCA Normal LVF

## 2019-11-06 NOTE — Assessment & Plan Note (Signed)
Controlled.  

## 2019-11-06 NOTE — Assessment & Plan Note (Signed)
Pt presented 10/21/2019 with NSTEMI

## 2019-11-06 NOTE — Telephone Encounter (Signed)
CSW received notice that email pt had confirmed was wrong, he provided new email address to this writer as [email or web address]. Resource list sent to correct email address. Pt called this writer back also inquiring about if he had the correct HOPE program number. CSW provided number and appropriate option to select, as well as [facility] [place]. Pt still planning on attending his 2:45pm appointment today.   Westley Hummer, MSW, [place] [employer]

## 2019-11-06 NOTE — Assessment & Plan Note (Signed)
Pt c/o "dry mouth" and SOB after taking his medication.  He actually went to the ED for this complaint 11/04/2019.  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  He was told to decrease Metoprolol to 12.5 mg BID. No improvement. Will try changing him from Brilinta to Plavix with loading dose.
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
## 2019-11-06 NOTE — Assessment & Plan Note (Signed)
LDL 108 on Pravachol- changed to Lipitor 80mg 

## 2019-11-11 ENCOUNTER — Telehealth (HOSPITAL_COMMUNITY): Payer: Self-pay

## 2019-11-11 NOTE — Telephone Encounter (Signed)
Called patient to see if he is interested in the Cardiac Rehab Program. Pt stated not at this time.  Closed referral

## 2019-11-11 NOTE — Telephone Encounter (Signed)
Pt insurance is active and benefits verified through [facility]. Co-pay $45.00, DED $0.00/$0.00 met, out of pocket $5,000.00/$50.00 met, co-insurance 0%. No pre-authorization required. Passport, 11/05/19 @319PM , [phone]

## 2019-11-15 ENCOUNTER — Telehealth (INDEPENDENT_AMBULATORY_CARE_PROVIDER_SITE_OTHER): Payer: Medicare HMO | Admitting: Adult Health

## 2019-11-15 ENCOUNTER — Telehealth (HOSPITAL_COMMUNITY): Payer: Self-pay | Admitting: *Deleted

## 2019-11-15 DIAGNOSIS — K117 Disturbances of salivary secretion: Secondary | ICD-10-CM
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
## 2019-11-15 NOTE — Telephone Encounter (Signed)
Pt called and reconsidered participation in cardiac rehab.  Pt reports feeling of increased anxiety and dry mouth since he was discharged from hospital.  Cardiac medications adjusted at follow up however the dry mouth persist. Talked with pt about the normalcy of anxiety after a cardiac event and that he should contact his PCP.  Pt agreed and will reach out and schedule an appt.  Pt scheduled for orientation 12/7 and will do exercise 2 x week due to work schedule starting on 12/13 at 7:15. Cherre Huger, BSN Cardiac and Training and development officer

## 2019-11-19 ENCOUNTER — Telehealth: Payer: Self-pay | Admitting: Cardiovascular Disease

## 2019-11-19 ENCOUNTER — Telehealth: Payer: Self-pay | Admitting: Adult Health

## 2019-11-19 NOTE — Telephone Encounter (Signed)
Patient called and stated that he is still experiencing a dryness in mouth and it is making it hard to swallow. Pt is asking to be referred to see a specialist, please advise. CB is [phone]

## 2019-11-19 NOTE — Telephone Encounter (Signed)
Her Flexeril is the most likely culprit - this has a 6-32% reported incidence of causing dry mouth.  Pt was recently seen by PCP on 11/15/19 who also advised him that Flexeril is likely causing his dry mouth. He was advised to stop taking Flexeril then and if symptoms continued after med d/c, pt would be referred to GI. Looks like pt also called PCP again today to discuss this referral since sx do not seem to have improved in the past few days.  If he had been using Flexeril frequently for muscle spasms, an alternative would be methocarbamol (Robaxin) which does not cause dry mouth.

## 2019-11-19 NOTE — Telephone Encounter (Signed)
Spoke with pt, aware none of his cardiac medications would cause dry mouth. He will follow up with his medical doctor for referral to GI.

## 2019-11-19 NOTE — Telephone Encounter (Signed)
Patient stated that he needs Dr. Gwenlyn Found or nurse to give him a call.

## 2019-11-19 NOTE — Telephone Encounter (Signed)
Please advise 

## 2019-11-19 NOTE — Telephone Encounter (Signed)
Spoke with pt, he is having trouble with dry mouth and dry throat to the point it is getting hard to swallow. He would like to know if any of his medications could cause this and if so can they be changed. Will forward to the pharmacist to review.

## 2019-11-20 ENCOUNTER — Emergency Department (HOSPITAL_COMMUNITY): Payer: Medicare HMO

## 2019-11-20 ENCOUNTER — Emergency Department (HOSPITAL_COMMUNITY)
Admission: EM | Admit: 2019-11-20 | Discharge: 2019-11-20 | Disposition: A | Discharge status: Home / Self Care | Payer: Medicare HMO | Attending: Emergency Medicine | Admitting: Emergency Medicine

## 2019-11-20 ENCOUNTER — Encounter (HOSPITAL_COMMUNITY): Payer: Self-pay | Admitting: Emergency Medicine

## 2019-11-20 ENCOUNTER — Other Ambulatory Visit: Payer: Self-pay | Admitting: Adult Health

## 2019-11-20 DIAGNOSIS — Z87891 Personal history of nicotine dependence: Secondary | ICD-10-CM | POA: Insufficient documentation

## 2019-11-20 DIAGNOSIS — R1310 Dysphagia, unspecified: Secondary | ICD-10-CM

## 2019-11-20 DIAGNOSIS — Z79899 Other long term (current) drug therapy: Secondary | ICD-10-CM | POA: Insufficient documentation

## 2019-11-20 DIAGNOSIS — I10 Essential (primary) hypertension: Secondary | ICD-10-CM | POA: Diagnosis not present

## 2019-11-20 DIAGNOSIS — R0989 Other specified symptoms and signs involving the circulatory and respiratory systems: Secondary | ICD-10-CM

## 2019-11-20 DIAGNOSIS — J189 Pneumonia, unspecified organism: Secondary | ICD-10-CM | POA: Diagnosis not present

## 2019-11-20 DIAGNOSIS — R198 Other specified symptoms and signs involving the digestive system and abdomen: Secondary | ICD-10-CM

## 2019-11-20 DIAGNOSIS — I2510 Atherosclerotic heart disease of native coronary artery without angina pectoris: Secondary | ICD-10-CM | POA: Diagnosis not present

## 2019-11-20 DIAGNOSIS — R682 Dry mouth, unspecified: Secondary | ICD-10-CM | POA: Insufficient documentation

## 2019-11-20 DIAGNOSIS — Z7982 Long term (current) use of aspirin: Secondary | ICD-10-CM | POA: Diagnosis not present

## 2019-11-20 DIAGNOSIS — R059 Cough, unspecified: Secondary | ICD-10-CM | POA: Diagnosis not present

## 2019-11-20 DIAGNOSIS — K117 Disturbances of salivary secretion: Secondary | ICD-10-CM

## 2019-11-20 LAB — BASIC METABOLIC PANEL
Anion gap: 9 (ref 5–15)
BUN: 7 mg/dL — ABNORMAL LOW (ref 8–23)
CO2: 23 mmol/L (ref 22–32)
Calcium: 9.5 mg/dL (ref 8.9–10.3)
Chloride: 109 mmol/L (ref 98–111)
Creatinine, Ser: 1.2 mg/dL (ref 0.61–1.24)
GFR, Estimated: >60 mL/min (ref >60)
Glucose, Bld: 140 mg/dL — ABNORMAL HIGH (ref 70–99)
Potassium: 3.6 mmol/L (ref 3.5–5.1)
Sodium: 141 mmol/L (ref 135–145)

## 2019-11-20 LAB — CBC WITH DIFFERENTIAL/PLATELET
Abs Immature Granulocytes: 0.01 10*3/uL (ref 0.00–0.07)
Basophils Absolute: 0 10*3/uL (ref 0.0–0.1)
Basophils Relative: 0 %
Eosinophils Absolute: 0.1 10*3/uL (ref 0.0–0.5)
Eosinophils Relative: 2 %
HCT: 41 % (ref 39.0–52.0)
Hemoglobin: 14 g/dL (ref 13.0–17.0)
Immature Granulocytes: 0 %
Lymphocytes Relative: 30 %
Lymphs Abs: 1.6 10*3/uL (ref 0.7–4.0)
MCH: 28.2 pg (ref 26.0–34.0)
MCHC: 34.1 g/dL (ref 30.0–36.0)
MCV: 82.7 fL (ref 80.0–100.0)
Monocytes Absolute: 0.3 10*3/uL (ref 0.1–1.0)
Monocytes Relative: 6 %
Neutro Abs: 3.3 10*3/uL (ref 1.7–7.7)
Neutrophils Relative %: 62 %
Platelets: 214 10*3/uL (ref 150–400)
RBC: 4.96 MIL/uL (ref 4.22–5.81)
RDW: 13.5 % (ref 11.5–15.5)
WBC: 5.3 10*3/uL (ref 4.0–10.5)
nRBC: 0 % (ref 0.0–0.2)

## 2019-11-20 LAB — TROPONIN I (HIGH SENSITIVITY): Troponin I (High Sensitivity): 5 ng/L (ref <18)

## 2019-11-20 MED ORDER — SODIUM CHLORIDE 0.9 % IV BOLUS
1000.0000 mL | Freq: Once | INTRAVENOUS | Status: AC
  Administered 2019-11-20: 1000 mL via INTRAVENOUS

## 2019-11-20 MED ORDER — SODIUM CHLORIDE 0.9 % IV BOLUS: 500.0000 mL | Freq: Once | INTRAVENOUS | Status: DC

## 2019-11-20 NOTE — ED Triage Notes (Signed)
Pt back today with continued complaints of difficulty swallowing , pt was here 2 weeks ago with same , and was referred to GI by primary but has not saw them yet , nad in triage

## 2019-11-20 NOTE — Discharge Instructions (Signed)
I recommend that you drink more water at home, and less sugar-drinks (including sweet teas and juice), which dehydrates you.  Coffee also dehydrates you - try to cut out it completely for a few days.  We talked to our pharmacist about your medications.  Alfuzosin (for your prostate) has been linked to dry mouth.  We would recommend stopping this for now.  Talk to your primary care provider about an alternative, like Finasteride, which should not cause dry mouth.  Finally you should follow up with a GI doctor for your difficulty swallowing.

## 2019-11-20 NOTE — ED Notes (Signed)
Patient transported to X-ray 
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
## 2019-11-20 NOTE — ED Notes (Signed)
Patient verbalized understanding of dc instructions, vss, ambulatory with nad.   

## 2019-11-21 ENCOUNTER — Encounter: Payer: Self-pay | Admitting: Nurse Practitioner

## 2019-11-25 ENCOUNTER — Telehealth: Payer: Self-pay | Admitting: Interventional Cardiology

## 2019-11-25 ENCOUNTER — Telehealth: Payer: Self-pay | Admitting: Adult Health

## 2019-11-25 ENCOUNTER — Encounter: Payer: Self-pay | Admitting: Nurse Practitioner

## 2019-11-25 NOTE — Telephone Encounter (Signed)
Spoke with pt who is complaining of pain under his left shoulder blade that started after he reached under his dresser for his remote control.  Pt denies active chest pain or pressure at this time.  Pt also denies N&V, dizziness or diaphoresis.  Pt has taken his 81mg  ASA this morning but has not taken his other morning medications. Pt has not tried taking nitroglycerine for relief.Pt admitted 1018/2021 for NSTEMI with stent placement. Pt has a history of back pain and has been prescribed Flexaril but states Dr Gwenlyn Found stopped this medication.  Pt advised current discomfort does not necessarily sound cardiac in nature but continue to monitor. Take morning medications as prescribed and check B/P and HR.  If pain increased or moves to chest ,arm or jaw pt advised to use his nitro as prescribed.  If pain continues he should contact 911 or have someone drive him to the ED for further evaluation.  Pt verbalizes understanding and agrees with current plan.

## 2019-11-25 NOTE — Telephone Encounter (Signed)
Pt is calling to report BP 138/89 pulse 65 11/25/2019 @ 8:45 a.m.

## 2019-11-25 NOTE — Telephone Encounter (Signed)
   Pt calling to report his BP to the Nurse.  138/89 HR 65

## 2019-11-25 NOTE — Telephone Encounter (Signed)
Pt c/o of Chest Pain: STAT if CP now or developed within 24 hours  1. Are you having CP right now? Yes but not as strong   2. Are you experiencing any other symptoms (ex. SOB, nausea, vomiting, sweating)? No   3. How long have you been experiencing CP?   4. Is your CP continuous or coming and going? continuous  5. Have you taken Nitroglycerin? No.  Pt said he woke up this morning and had some pain in his arm and chest. The pain has subsided for the most part. He is not sure if it was from how he slept or if it is heart related. He is just trying to be proactive  ?

## 2019-11-29 ENCOUNTER — Encounter: Payer: Self-pay | Admitting: Nurse Practitioner

## 2019-11-29 ENCOUNTER — Telehealth: Payer: Self-pay | Admitting: Nurse Practitioner

## 2019-11-29 NOTE — Telephone Encounter (Signed)
   Pt ate cabbage and gravy earlier today and developed indigestion.  No assoc Ss.  He doesn't think that this is similar to prior angina.  He called to ask if it was safe for him to take alka seltzer, and I advised that it is.  I further advised that if symptoms worsen, he develops associated symptoms, or symptoms are unrelenting, that he should have a low threshold to be evaluated @ urgent care or ED.  Caller verbalized understanding and was grateful for the call back.  Murray Hodgkins, NP 11/29/2019, 2:28 PM

## 2019-12-03 ENCOUNTER — Other Ambulatory Visit: Payer: Self-pay | Admitting: Adult Health

## 2019-12-03 DIAGNOSIS — R0789 Other chest pain: Secondary | ICD-10-CM

## 2019-12-05 ENCOUNTER — Telehealth (HOSPITAL_COMMUNITY): Payer: Self-pay | Admitting: *Deleted

## 2019-12-05 NOTE — Telephone Encounter (Signed)
Spoke with the patient confirmed appointment. Mr Dylan Lane is in the grocery store. Will call the patient tomorrow afternoon to complete.Barnet Pall, RN,BSN 12/05/2019 3:53 PM

## 2019-12-06 ENCOUNTER — Telehealth (HOSPITAL_COMMUNITY): Payer: Self-pay | Admitting: Pharmacist

## 2019-12-06 ENCOUNTER — Telehealth (HOSPITAL_COMMUNITY): Payer: Self-pay

## 2019-12-06 ENCOUNTER — Encounter (HOSPITAL_COMMUNITY)
Admission: RE | Admit: 2019-12-06 | Discharge: 2019-12-06 | Disposition: A | Discharge status: Home / Self Care | Payer: Medicare HMO | Source: Ambulatory Visit | Attending: Interventional Cardiology | Admitting: Interventional Cardiology

## 2019-12-06 ENCOUNTER — Telehealth (HOSPITAL_COMMUNITY): Payer: Self-pay | Admitting: *Deleted

## 2019-12-06 ENCOUNTER — Other Ambulatory Visit: Payer: Self-pay

## 2019-12-06 NOTE — Telephone Encounter (Addendum)
Cardiac Rehab Medication Review by a Pharmacist  Does the patient  feel that his/her medications are working for him/her?  yes  Has the patient been experiencing any side effects to the medications prescribed?  no  Does the patient measure his/her own blood pressure or blood glucose at home? Yes - 138/79    Does the patient have any problems obtaining medications due to transportation or finances?   no  Understanding of regimen: poor Understanding of indications: poor Potential of compliance: good  Pharmacist Intervention: Patient is a poor historian, he does not easily recall medication names or indication. Encouraged patient to bring in medications with him to upcoming Cardiac Rehab Orientation visit to clarify. Of note, patient reports difficulty swallowing medications and states he was told to stop taking Plavix in November 2021, however writer was unable to find documentation of this in the chart.   Fara Olden, PharmD PGY-1 Ambulatory Care Pharmacy Resident 12/06/2019 3:15 PM

## 2019-12-06 NOTE — Telephone Encounter (Signed)
Spoke with the patient completed health history.Barnet Pall, RN,BSN 12/06/2019 4:44 PM

## 2019-12-07 ENCOUNTER — Telehealth (HOSPITAL_COMMUNITY): Payer: Self-pay | Admitting: Pharmacist

## 2019-12-07 NOTE — Telephone Encounter (Signed)
S: Attempted to call patient this evening to clarify if patient is taking Plavix per medication reconciliation on Friday 12/06/19.  B: Writer spoke with patient for medication reconciliation on 12/06/19 for upcoming scheduled Cardiac Rehab orientation on 12/10/19. Patient is a poor historian and unable to clearly identify medication names and how he is taking them. After multiple clarifying questions, patient states he was told by a provider to stop taking Plavix. However, patient is unable to state which provider and when this happened, but states he thinks he stopped taking Plavix about a month ago.  Per chart review, unable to identify documentation instructing patient to stop taking Plavix. Of note, patient has been experiencing dry mouth and was switched from Brilinta to Plavix in early November 2021. Patient was told to stop taking Flexeril in late October by cardiologist due to possible cause of dry mouth, patient confirmed he stopped taking Flexeril. Patient seen in ED on 11/20/19 for dry mouth but no medication changes made. Per dispense history, patient filled Plavix once on 11/06/19 for 90 day supply.  Given patient was a poor historian and unable to clearly identify if patient has stopped taking Plavix or not, writer did not urgently notify Cardiac Rehab nurses or cardiologist and instructed patient to bring in all his medications on his scheduled appointment on 12/10/19 to verify. Writer emailed cardiac rehab nurses to relay follow up plan and RN Barnet Pall replied today 12/07/19 to state patient must notify cardiologist ASAP if he is not taking Plavix.  A: Patient did not answer, left voice message asking patient to call on-call provider as soon as possible at [phone] if he is not taking Plavix.  R: Writer will re-attempt call on 12/08/19. RN Barnet Pall states she will follow up with patient on Monday 12/09/19 if needed.  Fara Olden, PharmD PGY-1 Ambulatory Care Pharmacy  Resident 12/07/2019 9:09 PM

## 2019-12-08 ENCOUNTER — Telehealth: Payer: Self-pay | Admitting: Physician Assistant

## 2019-12-08 MED ORDER — CLOPIDOGREL BISULFATE 75 MG PO TABS: 75.0000 mg | ORAL_TABLET | Freq: Every day | ORAL | 4 refills | Status: DC

## 2019-12-08 NOTE — Telephone Encounter (Signed)
Attempted to reach patient via telephone, patient did not answer.  Left voice message asking patient to call on-call provider as soon as possible at [phone] if he is not taking Plavix, or to call Cardiologist at [facility] office if he is calling during business hours.  Cardiac Rehab nurse will follow up with the patient on Monday 12/09/19.  Fara Olden, PharmD PGY-1 Ambulatory Care Pharmacy Resident 12/08/2019 10:39 AM

## 2019-12-08 NOTE — Telephone Encounter (Signed)
    Dylan Lane called because the pharmacist has been contacting him asking him if he was taking some medications, he is not sure which ones.  In reviewing his medications, he was on Brilinta right after his stent, but it is not clear that he was taking the Plavix.  I got the patient to review his medications with me.  The medications include clopidogrel 75 mg daily which he says he is taking.  At his office visit on 11/06/2019, there was concern that the Brilinta was causing side effects.  The Brilinta was stopped and the clopidogrel was started.  He does not report any side effects from the clopidogrel and is compliant with it.  I refilled the Plavix and gave him a years worth.  No other issues or concerns.  Rosaria Ferries, PA-C 12/08/2019 5:18 PM

## 2019-12-09 ENCOUNTER — Telehealth (HOSPITAL_COMMUNITY): Payer: Self-pay

## 2019-12-09 NOTE — Telephone Encounter (Signed)
Cardiac Rehab Note:  Successful telephone encounter to Dylan Lane in response to a left telephone message on department secure VM stating he would like to "cancel his appointment for today". Upon return call, [name] states he does not wish to cancel his cardiac rehab appointment for 12/10/19 and would present to the cardiac rehab department at 7:45 am for orientation Tuesday. CR RN also confirmed patient continues to take his Plavix and was able to read name and dosage from pill bottle.   [name] E. Rollene Rotunda RN, BSN [place]. [facility]  Cardiac and Pulmonary Rehabilitation Phone: [phone] Fax: [phone]

## 2019-12-10 ENCOUNTER — Encounter (HOSPITAL_COMMUNITY): Payer: Self-pay

## 2019-12-10 ENCOUNTER — Telehealth: Payer: Self-pay | Admitting: Cardiovascular Disease

## 2019-12-10 ENCOUNTER — Encounter (HOSPITAL_COMMUNITY)
Admission: RE | Admit: 2019-12-10 | Discharge: 2019-12-10 | Disposition: A | Discharge status: Home / Self Care | Payer: Medicare HMO | Source: Ambulatory Visit | Attending: Interventional Cardiology | Admitting: Interventional Cardiology

## 2019-12-10 ENCOUNTER — Other Ambulatory Visit: Payer: Self-pay

## 2019-12-10 VITALS — BP 108/70 | HR 76 | Ht 67.25 in | Wt 188.7 lb

## 2019-12-10 DIAGNOSIS — I214 Non-ST elevation (NSTEMI) myocardial infarction: Secondary | ICD-10-CM

## 2019-12-10 DIAGNOSIS — Z955 Presence of coronary angioplasty implant and graft: Secondary | ICD-10-CM

## 2019-12-10 NOTE — Telephone Encounter (Signed)
Will route to triage to follow up with pt tomorrow once we hear back from pharmacy since I am off.

## 2019-12-10 NOTE — Progress Notes (Signed)
Cardiac Individual Treatment Plan  Patient Details  Name: Arvle Grabe MRN: [id number] Date of Birth: [birth year] Referring Provider:     [place] from 12/10/2019 in [place]  Referring Provider Belva Crome, MD      Initial Encounter Date:    CARDIAC REHAB PHASE II ORIENTATION from 12/10/2019 in [place]  Date 12/10/19      Visit Diagnosis: 10/21/19 NSTEMI  10/21/19 DES distal Cfx  Patient's Home Medications on Admission:  Current Outpatient Medications:  .  acetaminophen (TYLENOL) 325 MG tablet, Take 650 mg by mouth every 6 (six) hours as needed for mild pain or headache., Disp: , Rfl:  .  amLODipine (NORVASC) 10 MG tablet, Take 1 tablet by mouth once daily, Disp: 90 tablet, Rfl: 0 .  aspirin 81 MG EC tablet, Take 1 tablet (81 mg total) by mouth daily., Disp: 30 tablet, Rfl: 11 .  atorvastatin (LIPITOR) 80 MG tablet, Take 1 tablet (80 mg total) by mouth daily., Disp: 90 tablet, Rfl: 3 .  clopidogrel (PLAVIX) 75 MG tablet, Take 1 tablet (75 mg total) by mouth daily., Disp: 94 tablet, Rfl: 4 .  metoprolol tartrate (LOPRESSOR) 25 MG tablet, Take 1 tablet (25 mg total) by mouth 2 (two) times daily. (Patient taking differently: Take 25 mg by mouth 2 (two) times daily. Taking twice a day), Disp: 180 tablet, Rfl: 3 .  nitroGLYCERIN (NITROSTAT) 0.4 MG SL tablet, Place 1 tablet (0.4 mg total) under the tongue every 5 (five) minutes x 3 doses as needed for chest pain., Disp: 25 tablet, Rfl: 0 .  pantoprazole (PROTONIX) 40 MG tablet, Take 1 tablet (40 mg total) by mouth daily. (Patient not taking: Reported on 12/10/2019), Disp: 90 tablet, Rfl: 3  Past Medical History: Past Medical History:  Diagnosis Date  . CAD (coronary artery disease), native coronary artery    a. cath 10/21/19 s/p DES to Lcx, medical therapy for 50% LAD stenosis.  . Colon polyps    3/06 and 2009:needs repeat in 3  yrs(3/12)  . GERD (gastroesophageal reflux disease) 2017  . Hyperlipidemia   . Hypertension   . Pre-diabetes    with fasting glucose of 110(2/09)  . Rectal bleeding    History of    Tobacco Use: Social History   Tobacco Use  Smoking Status Former Smoker  . Types: Cigarettes  . Quit date: 03/14/2000  . Years since quitting: 19.7  Smokeless Tobacco Never Used    Labs: Recent Chemical engineer    Labs for ITP Cardiac and Pulmonary Rehab Latest Ref Rng & Units 06/05/2013 01/22/2015 07/16/2018 03/19/2019 07/16/2019   Cholestrol <200 mg/dL - 174 - - 168   LDLCALC mg/dL (calc) - 114(H) - - 108(H)   HDL > OR = 40 mg/dL - 42 - - 44   Trlycerides <150 mg/dL - 92 - - 69   Hemoglobin A1c <5.7 % of total Hgb 5.8(H) 6.1(H) 6.4(A) 5.6 5.8(H)      Capillary Blood Glucose: Lab Results  Component Value Date   GLUCAP 103 (H) 07/16/2018   GLUCAP 95 12/22/2014   GLUCAP 87 02/05/2014   GLUCAP 101 (H) 06/06/2008     Exercise Target Goals: Exercise Program Goal: Individual exercise prescription set using results from initial 6 min walk test and THRR while considering  patient's activity barriers and safety.   Exercise Prescription Goal: Starting with aerobic activity 30 plus minutes a day, 3 days per  week for initial exercise prescription. Provide home exercise prescription and guidelines that participant acknowledges understanding prior to discharge.  Activity Barriers & Risk Stratification:  Activity Barriers & Cardiac Risk Stratification - 12/10/19 0803      Activity Barriers & Cardiac Risk Stratification   Activity Barriers None    Cardiac Risk Stratification High           6 Minute Walk:  6 Minute Walk    Row Name 12/10/19 0814         6 Minute Walk   Phase Initial     Distance 1641 feet     Walk Time 6 minutes     # of Rest Breaks 0     MPH 3.11     METS 3.29     RPE 13     Perceived Dyspnea  0     VO2 Peak 11.51     Symptoms No     Resting HR 76 bpm      Resting BP 108/70     Resting Oxygen Saturation  98 %     Exercise Oxygen Saturation  during 6 min walk 97 %     Max Ex. HR 85 bpm     Max Ex. BP 112/78     2 Minute Post BP 98/66            Oxygen Initial Assessment:   Oxygen Re-Evaluation:   Oxygen Discharge (Final Oxygen Re-Evaluation):   Initial Exercise Prescription:  Initial Exercise Prescription - 12/10/19 0900      Date of Initial Exercise RX and Referring Provider   Date 12/10/19    Referring Provider Belva Crome, MD    Expected Discharge Date 01/01/20      Treadmill   MPH 3    Grade 0    Minutes 15    METs 3.3      NuStep   Level 3    SPM 85    Minutes 15    METs 2.5      Prescription Details   Frequency (times per week) 2    Duration Progress to 30 minutes of continuous aerobic without signs/symptoms of physical distress      Intensity   THRR 40-80% of Max Heartrate 62-123    Ratings of Perceived Exertion 11-13    Perceived Dyspnea 0-4      Progression   Progression Continue to progress workloads to maintain intensity without signs/symptoms of physical distress.      Resistance Training   Training Prescription Yes    Weight 4 lbs    Reps 10-15           Perform Capillary Blood Glucose checks as needed.  Exercise Prescription Changes:   Exercise Comments:   Exercise Goals and Review:  Exercise Goals    Row Name 12/10/19 0804             Exercise Goals   Increase Physical Activity Yes       Intervention Provide advice, education, support and counseling about physical activity/exercise needs.;Develop an individualized exercise prescription for aerobic and resistive training based on initial evaluation findings, risk stratification, comorbidities and participant's personal goals.       Expected Outcomes Short Term: Attend rehab on a regular basis to increase amount of physical activity.;Long Term: Add in home exercise to make exercise part of routine and to increase amount of  physical activity.;Long Term: Exercising regularly at least 3-5 days a week.  Increase Strength and Stamina Yes       Intervention Provide advice, education, support and counseling about physical activity/exercise needs.;Develop an individualized exercise prescription for aerobic and resistive training based on initial evaluation findings, risk stratification, comorbidities and participant's personal goals.       Expected Outcomes Short Term: Increase workloads from initial exercise prescription for resistance, speed, and METs.;Short Term: Perform resistance training exercises routinely during rehab and add in resistance training at home;Long Term: Improve cardiorespiratory fitness, muscular endurance and strength as measured by increased METs and functional capacity (6MWT)       Able to understand and use rate of perceived exertion (RPE) scale Yes       Intervention Provide education and explanation on how to use RPE scale       Expected Outcomes Short Term: Able to use RPE daily in rehab to express subjective intensity level;Long Term:  Able to use RPE to guide intensity level when exercising independently       Knowledge and understanding of Target Heart Rate Range (THRR) Yes       Intervention Provide education and explanation of THRR including how the numbers were predicted and where they are located for reference       Expected Outcomes Short Term: Able to state/look up THRR;Long Term: Able to use THRR to govern intensity when exercising independently;Short Term: Able to use daily as guideline for intensity in rehab       Able to check pulse independently Yes       Intervention Provide education and demonstration on how to check pulse in carotid and radial arteries.;Review the importance of being able to check your own pulse for safety during independent exercise       Expected Outcomes Short Term: Able to explain why pulse checking is important during independent exercise;Long Term: Able to  check pulse independently and accurately       Understanding of Exercise Prescription Yes       Intervention Provide education, explanation, and written materials on patient's individual exercise prescription       Expected Outcomes Short Term: Able to explain program exercise prescription;Long Term: Able to explain home exercise prescription to exercise independently              Exercise Goals Re-Evaluation :    Discharge Exercise Prescription (Final Exercise Prescription Changes):   Nutrition:  Target Goals: Understanding of nutrition guidelines, daily intake of sodium 1500mg , cholesterol 200mg , calories 30% from fat and 7% or less from saturated fats, daily to have 5 or more servings of fruits and vegetables.  Biometrics:  Pre Biometrics - 12/10/19 0726      Pre Biometrics   Waist Circumference 39.5 inches    Hip Circumference 40 inches    Waist to Hip Ratio 0.99 %    Triceps Skinfold 13 mm    % Body Fat 27.4 %    Grip Strength 40.5 kg    Flexibility 0 in    Single Leg Stand 24.81 seconds            Nutrition Therapy Plan and Nutrition Goals:   Nutrition Assessments:  MEDIFICTS Score Key:  ?70 Need to make dietary changes   40-70 Heart Healthy Diet  ? 40 Therapeutic Level Cholesterol Diet   Picture Your Plate Scores:  <59 Unhealthy dietary pattern with much room for improvement.  41-50 Dietary pattern unlikely to meet recommendations for good health and room for improvement.  51-60 More healthful dietary pattern, with  some room for improvement.   >60 Healthy dietary pattern, although there may be some specific behaviors that could be improved.    Nutrition Goals Re-Evaluation:   Nutrition Goals Discharge (Final Nutrition Goals Re-Evaluation):   Psychosocial: Target Goals: Acknowledge presence or absence of significant depression and/or stress, maximize coping skills, provide positive support system. Participant is able to verbalize types and  ability to use techniques and skills needed for reducing stress and depression.  Initial Review & Psychosocial Screening:  Initial Psych Review & Screening - 12/10/19 4132      Initial Review   Current issues with History of Depression;Current Anxiety/Panic;Current Stress Concerns    Source of Stress Concerns Unable to perform yard/household activities;Chronic Illness      Family Dynamics   Good Support System? Yes   Rishard has his brother for support     Barriers   Psychosocial barriers to participate in program The patient should benefit from training in stress management and relaxation.      Screening Interventions   Interventions Encouraged to exercise    Expected Outcomes Long Term Goal: Stressors or current issues are controlled or eliminated.;Short Term goal: Identification and review with participant of any Quality of Life or Depression concerns found by scoring the questionnaire.;Long Term goal: The participant improves quality of Life and PHQ9 Scores as seen by post scores and/or verbalization of changes           Quality of Life Scores:  Quality of Life - 12/10/19 0855      Quality of Life   Select Quality of Life      Quality of Life Scores   Health/Function Pre 25.04 %    Socioeconomic Pre 30 %    Psych/Spiritual Pre 30 %    Family Pre 30 %    GLOBAL Pre 27.92 %          Scores of 19 and below usually indicate a poorer quality of life in these areas.  A difference of  2-3 points is a clinically meaningful difference.  A difference of 2-3 points in the total score of the Quality of Life Index has been associated with significant improvement in overall quality of life, self-image, physical symptoms, and general health in studies assessing change in quality of life.  PHQ-9: Recent Review Flowsheet Data    Depression screen [facility] 2/9 12/10/2019 12/06/2018 07/16/2018 11/03/2017 09/07/2017   Decreased Interest 0 0 0 0 1   Down, Depressed, Hopeless 1  0 0 0 0   PHQ - 2  Score 1 0 0 0 1   Altered sleeping - 1  - - 0   Tired, decreased energy - 0 - - 0   Change in appetite - 2 - - 0   Feeling bad or failure about yourself  - 0 - - 0   Trouble concentrating - 0 - - 0   Moving slowly or fidgety/restless - 0 - - 0   Suicidal thoughts - 0 - - 0   PHQ-9 Score - 3 - - 1   Difficult doing work/chores - Not difficult at all - - Not difficult at all     Interpretation of Total Score  Total Score Depression Severity:  1-4 = Minimal depression, 5-9 = Mild depression, 10-14 = Moderate depression, 15-19 = Moderately severe depression, 20-27 = Severe depression   Psychosocial Evaluation and Intervention:   Psychosocial Re-Evaluation:   Psychosocial Discharge (Final Psychosocial Re-Evaluation):   Vocational Rehabilitation: Provide vocational rehab assistance  to qualifying candidates.   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 Participant will state understanding/return demonstration of topics presented.  Learning Barriers/Preferences:  Learning Barriers/Preferences - 12/10/19 0935      Learning Barriers/Preferences   Learning Barriers Sight   wears glasses   Learning Preferences Video;Pictoral           Education Topics: Hypertension, Hypertension Reduction -Define heart disease and high blood pressure. Discus how high blood pressure affects the body and ways to reduce high blood pressure.   Exercise and Your Heart -Discuss why it is important to exercise, the FITT principles of exercise, normal and abnormal responses to exercise, and how to exercise safely.   Angina -Discuss definition of  angina, causes of angina, treatment of angina, and how to decrease risk of having angina.   Cardiac Medications -Review what the following cardiac medications are used for, how they affect the body, and side effects that may occur when taking the medications.  Medications include Aspirin, Beta blockers, calcium channel blockers, ACE Inhibitors, angiotensin receptor blockers, diuretics, digoxin, and antihyperlipidemics.   Congestive Heart Failure -Discuss the definition of CHF, how to live with CHF, the signs and symptoms of CHF, and how keep track of weight and sodium intake.   Heart Disease and Intimacy -Discus the effect sexual activity has on the heart, how changes occur during intimacy as we age, and safety during sexual activity.   Smoking Cessation / COPD -Discuss different methods to quit smoking, the health benefits of quitting smoking, and the definition of COPD.   Nutrition I: Fats -Discuss the types of cholesterol, what cholesterol does to the heart, and how cholesterol levels can be controlled.   Nutrition II: Labels -Discuss the different components of food labels and how to read food label   Heart Parts/Heart Disease and PAD -Discuss the anatomy of the heart, the pathway of blood circulation through the heart, and these are affected by heart disease.   Stress I: Signs and Symptoms -Discuss the causes of stress, how stress may lead to anxiety and depression, and ways to limit stress.   Stress II: Relaxation -Discuss different types of relaxation techniques to limit stress.   Warning Signs of Stroke / TIA -Discuss definition of a stroke, what the signs and symptoms are of a stroke, and how to identify when someone is having stroke.   Knowledge Questionnaire Score:  Knowledge Questionnaire Score - 12/10/19 0853      Knowledge Questionnaire Score   Pre Score 4/24           Core Components/Risk Factors/Patient Goals at Admission:  Personal Goals and Risk  Factors at Admission - 12/10/19 0728      Core Components/Risk Factors/Patient Goals on Admission    Weight Management Yes;Weight Loss    Intervention Weight Management: Provide education and appropriate resources to help participant work on and attain dietary goals.;Weight Management: Develop a combined nutrition and exercise program designed to reach desired caloric intake, while maintaining appropriate intake of nutrient and fiber, sodium and fats, and appropriate energy expenditure required for the weight goal.    Admit Weight 188  lb 11.4 oz (85.6 kg)    Goal Weight: Short Term 182 lb (82.6 kg)    Goal Weight: Long Term 150 lb (68 kg)    Expected Outcomes Short Term: Continue to assess and modify interventions until short term weight is achieved;Long Term: Adherence to nutrition and physical activity/exercise program aimed toward attainment of established weight goal;Weight Loss: Understanding of general recommendations for a balanced deficit meal plan, which promotes 1-2 lb weight loss per week and includes a negative energy balance of [phone] kcal/d;Understanding recommendations for meals to include 15-35% energy as protein, 25-35% energy from fat, 35-60% energy from carbohydrates, less than 200mg  of dietary cholesterol, 20-35 gm of total fiber daily;Understanding of distribution of calorie intake throughout the day with the consumption of 4-5 meals/snacks    Hypertension Yes    Intervention Provide education on lifestyle modifcations including regular physical activity/exercise, weight management, moderate sodium restriction and increased consumption of fresh fruit, vegetables, and low fat dairy, alcohol moderation, and smoking cessation.;Monitor prescription use compliance.    Expected Outcomes Short Term: Continued assessment and intervention until BP is < 140/49mm HG in hypertensive participants. < 130/48mm HG in hypertensive participants with diabetes, heart failure or chronic kidney  disease.;Long Term: Maintenance of blood pressure at goal levels.    Lipids Yes    Intervention Provide education and support for participant on nutrition & aerobic/resistive exercise along with prescribed medications to achieve LDL 70mg , HDL >40mg .    Expected Outcomes Short Term: Participant states understanding of desired cholesterol values and is compliant with medications prescribed. Participant is following exercise prescription and nutrition guidelines.;Long Term: Cholesterol controlled with medications as prescribed, with individualized exercise RX and with personalized nutrition plan. Value goals: LDL < 70mg , HDL > 40 mg.           Core Components/Risk Factors/Patient Goals Review:    Core Components/Risk Factors/Patient Goals at Discharge (Final Review):    ITP Comments:  ITP Comments    Row Name 12/10/19 0727           ITP Comments Medical Director- Dr. Fransico Him, MD              Comments:Julis attended orientation on 12/10/2019 to review rules and guidelines for program.  Completed 6 minute walk test, Intitial ITP, and exercise prescription.  VSS. Telemetry-Sinus Rhythm.  Asymptomatic. Safety measures and social distancing in place per CDC guidelines.Harless will only be able to participate in phase 2 cardiac rehab until the end of the month due to his high co pay.Barnet Pall, RN,BSN 12/10/2019 9:46 AM
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
## 2019-12-10 NOTE — Telephone Encounter (Signed)
Pt c/o medication issue:  1. Name of Medication: alfuzosin 10 mg  2. How are you currently taking this medication (dosage and times per day)? Not currently taking  3. Are you having a reaction (difficulty breathing--STAT)? no  4. What is your medication issue? Patient states he was taking the medication for his bladder but was taken off of it. He would like to know if he can start the medication again.

## 2019-12-10 NOTE — Telephone Encounter (Signed)
Routed to pharmD to review medication/contraindications.     Primary Cards: Dr. Delmar Landau Ch [facility] triage to follow up.   Patient made aware.

## 2019-12-11 ENCOUNTER — Ambulatory Visit: Payer: Medicare HMO | Admitting: Nurse Practitioner

## 2019-12-11 ENCOUNTER — Encounter: Payer: Self-pay | Admitting: Nurse Practitioner

## 2019-12-11 VITALS — BP 120/78 | HR 76 | Ht 67.0 in | Wt 189.0 lb

## 2019-12-11 DIAGNOSIS — K117 Disturbances of salivary secretion: Secondary | ICD-10-CM

## 2019-12-11 NOTE — Patient Instructions (Addendum)
If you are age 67 or older, your body mass index should be between 23-30. Your Body mass index is 29.6 kg/m. If this is out of the aforementioned range listed, please consider follow up with your Primary Care Provider.  If you are age 14 or younger, your body mass index should be between 19-25. Your Body mass index is 29.6 kg/m. If this is out of the aformentioned range listed, please consider follow up with your Primary Care Provider.   Referral placed to ENT.   Restart Pantoprazole 40 mg daily.   Follow up with Dr. Hilarie Fredrickson in 6-8 weeks.   You have been scheduled for a Barium Esophogram at [facility] (1st floor of the hospital) on Thursday 12/19/19 at 9:30 am. Please arrive 15 minutes prior to your appointment for registration. Make certain not to have anything to eat or drink 3 hours prior to your test. If you need to reschedule for any reason, please contact radiology at [phone] to do so. ______________________________________________________________ A barium swallow is an examination that concentrates on views of the esophagus. This tends to be a double contrast exam (barium and two liquids which, when combined, create a gas to distend the wall of the oesophagus) or single contrast (non-ionic iodine based). The study is usually tailored to your symptoms so a good history is essential. Attention is paid during the study to the form, structure and configuration of the esophagus, looking for functional disorders (such as aspiration, dysphagia, achalasia, motility and reflux) EXAMINATION You may be asked to change into a gown, depending on the type of swallow being performed. A radiologist and radiographer will perform the procedure. The radiologist will advise you of the type of contrast selected for your procedure and direct you during the exam. You will be asked to stand, sit or lie in several different positions and to hold a small amount of fluid in your mouth before being asked  to swallow while the imaging is performed .In some instances you may be asked to swallow barium coated marshmallows to assess the motility of a solid food bolus. The exam can be recorded as a digital or video fluoroscopy procedure. POST PROCEDURE It will take 1-2 days for the barium to pass through your system. To facilitate this, it is important, unless otherwise directed, to increase your fluids for the next 24-48hrs and to resume your normal diet.  This test typically takes about 30 minutes to perform. ______________________________________________________________
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
## 2019-12-11 NOTE — Telephone Encounter (Signed)
It looks like alfluzosin was stopped to see if it was causing his dry mouth. Was not stopped for a cardiac reason. If patient needs something for his BPH he should discuss this with his PCP.

## 2019-12-11 NOTE — Telephone Encounter (Signed)
Advised patient with information from [facility]. Patient to contact PCP next regarding medication.

## 2019-12-16 ENCOUNTER — Encounter (HOSPITAL_COMMUNITY)
Admission: RE | Admit: 2019-12-16 | Discharge: 2019-12-16 | Disposition: A | Discharge status: Home / Self Care | Payer: Medicare HMO | Source: Ambulatory Visit | Attending: Interventional Cardiology | Admitting: Interventional Cardiology

## 2019-12-16 ENCOUNTER — Other Ambulatory Visit: Payer: Self-pay

## 2019-12-16 ENCOUNTER — Ambulatory Visit (HOSPITAL_COMMUNITY): Payer: Medicare HMO

## 2019-12-16 DIAGNOSIS — I214 Non-ST elevation (NSTEMI) myocardial infarction: Secondary | ICD-10-CM | POA: Diagnosis not present

## 2019-12-16 DIAGNOSIS — Z955 Presence of coronary angioplasty implant and graft: Secondary | ICD-10-CM

## 2019-12-16 NOTE — Progress Notes (Signed)
Daily Session Note  Patient Details  Name: Dylan Lane MRN: [id number] Date of Birth: [birth year] Referring Provider:   Flowsheet Row CARDIAC REHAB PHASE II ORIENTATION from 12/10/2019 in [place]  Referring Provider Belva Crome, MD      Encounter Date: 12/16/2019  Check In:  Session Check In - 12/16/19 0729      Check-In   Supervising physician immediately available to respond to emergencies [facility] immediately available    Physician(s) Dr. Cyndia Skeeters    Location [facility]    Staff Present Seward Carol, MS, ACSM CEP, Exercise Physiologist;Jessica Hassell Done, MS, ACSM-CEP, Exercise Physiologist;Shakeema Lippman Rollene Rotunda, RN, BSN;Ramon Dredge, RN, MHA;Other    Virtual Visit No    Medication changes reported     No    Fall or balance concerns reported    No    Tobacco Cessation No Change    Warm-up and Cool-down Performed on first and last piece of equipment    Resistance Training Performed Yes    VAD Patient? No    PAD/SET Patient? No      Pain Assessment   Currently in Pain? No/denies    Pain Score 0-No pain    Multiple Pain Sites No           Capillary Blood Glucose: No results found for this or any previous visit (from the past 24 hour(s)).    Social History   Tobacco Use  Smoking Status Former Smoker  . Types: Cigarettes  . Quit date: 03/14/2000  . Years since quitting: 19.7  Smokeless Tobacco Never Used    Goals Met:  Exercise tolerated well Personal goals reviewed No report of cardiac concerns or symptoms Strength training completed today  Goals Unmet:  Not Applicable  Comments: Pt started cardiac rehab today.  Pt tolerated light exercise without difficulty. VSS, telemetry-NSR, asymptomatic.  Medication list reconciled. Pt denies barriers to medicaiton compliance.  PSYCHOSOCIAL ASSESSMENT:  PHQ-0. Pt exhibits positive coping skills, hopeful outlook with supportive family. No psychosocial  needs identified at this time, no psychosocial interventions necessary.  Pt oriented to exercise equipment and routine.    Understanding verbalized.   Dr. Fransico Him is Medical Director for Cardiac Rehab at [facility].

## 2019-12-16 NOTE — Progress Notes (Signed)
Addendum: Reviewed and agree with assessment and management plan. Valita Righter M, MD  

## 2019-12-17 ENCOUNTER — Ambulatory Visit (HOSPITAL_COMMUNITY): Payer: Medicare HMO

## 2019-12-17 NOTE — Progress Notes (Signed)
Cardiac Individual Treatment Plan  Patient Details  Name: Dylan Lane MRN: [id number] Date of Birth: 01-Oct-1950 Referring Provider:   Flowsheet Row CARDIAC REHAB PHASE II ORIENTATION from 12/10/2019 in [place]  Referring Provider Belva Crome, MD      Initial Encounter Date:  [place] PHASE II ORIENTATION from 12/10/2019 in [place]  Date 12/10/19      Visit Diagnosis: 10/21/19 NSTEMI  10/21/19 DES distal Cfx  Patient's Home Medications on Admission:  Current Outpatient Medications:  .  acetaminophen (TYLENOL) 325 MG tablet, Take 650 mg by mouth every 6 (six) hours as needed for mild pain or headache., Disp: , Rfl:  .  amLODipine (NORVASC) 10 MG tablet, Take 1 tablet by mouth once daily, Disp: 90 tablet, Rfl: 0 .  aspirin 81 MG EC tablet, Take 1 tablet (81 mg total) by mouth daily., Disp: 30 tablet, Rfl: 11 .  atorvastatin (LIPITOR) 80 MG tablet, Take 1 tablet (80 mg total) by mouth daily., Disp: 90 tablet, Rfl: 3 .  clopidogrel (PLAVIX) 75 MG tablet, Take 1 tablet (75 mg total) by mouth daily., Disp: 94 tablet, Rfl: 4 .  metoprolol tartrate (LOPRESSOR) 25 MG tablet, Take 1 tablet (25 mg total) by mouth 2 (two) times daily. (Patient taking differently: Take 25 mg by mouth 2 (two) times daily. Taking twice a day), Disp: 180 tablet, Rfl: 3 .  nitroGLYCERIN (NITROSTAT) 0.4 MG SL tablet, Place 1 tablet (0.4 mg total) under the tongue every 5 (five) minutes x 3 doses as needed for chest pain., Disp: 25 tablet, Rfl: 0 .  pantoprazole (PROTONIX) 40 MG tablet, Take 1 tablet (40 mg total) by mouth daily., Disp: 90 tablet, Rfl: 3  Past Medical History: Past Medical History:  Diagnosis Date  . CAD (coronary artery disease), native coronary artery    a. cath 10/21/19 s/p DES to Lcx, medical therapy for 50% LAD stenosis.  . Colon polyps    3/06 and 2009:needs repeat in 3 yrs(3/12)  . GERD  (gastroesophageal reflux disease) 2017  . Hyperlipidemia   . Hypertension   . Pre-diabetes    with fasting glucose of 110(2/09)  . Rectal bleeding    History of    Tobacco Use: Social History   Tobacco Use  Smoking Status Former Smoker  . Types: Cigarettes  . Quit date: 03/14/2000  . Years since quitting: 19.7  Smokeless Tobacco Never Used    Labs: Recent Chemical engineer    Labs for ITP Cardiac and Pulmonary Rehab Latest Ref Rng & Units 06/05/2013 01/22/2015 07/16/2018 03/19/2019 07/16/2019   Cholestrol <200 mg/dL - 174 - - 168   LDLCALC mg/dL (calc) - 114(H) - - 108(H)   HDL > OR = 40 mg/dL - 42 - - 44   Trlycerides <150 mg/dL - 92 - - 69   Hemoglobin A1c <5.7 % of total Hgb 5.8(H) 6.1(H) 6.4(A) 5.6 5.8(H)      Capillary Blood Glucose: Lab Results  Component Value Date   GLUCAP 103 (H) 07/16/2018   GLUCAP 95 12/22/2014   GLUCAP 87 02/05/2014   GLUCAP 101 (H) 06/06/2008     Exercise Target Goals: Exercise Program Goal: Individual exercise prescription set using results from initial 6 min walk test and THRR while considering  patient's activity barriers and safety.   Exercise Prescription Goal: Initial exercise prescription builds to 30-45 minutes a day of aerobic activity, 2-3 days per week.  Home  exercise guidelines will be given to patient during program as part of exercise prescription that the participant will acknowledge.  Activity Barriers & Risk Stratification:  Activity Barriers & Cardiac Risk Stratification - 12/10/19 0803      Activity Barriers & Cardiac Risk Stratification   Activity Barriers None    Cardiac Risk Stratification High           6 Minute Walk:  6 Minute Walk    Row Name 12/10/19 0814         6 Minute Walk   Phase Initial     Distance 1641 feet     Walk Time 6 minutes     # of Rest Breaks 0     MPH 3.11     METS 3.29     RPE 13     Perceived Dyspnea  0     VO2 Peak 11.51     Symptoms No     Resting HR 76 bpm      Resting BP 108/70     Resting Oxygen Saturation  98 %     Exercise Oxygen Saturation  during 6 min walk 97 %     Max Ex. HR 85 bpm     Max Ex. BP 112/78     2 Minute Post BP 98/66            Oxygen Initial Assessment:   Oxygen Re-Evaluation:   Oxygen Discharge (Final Oxygen Re-Evaluation):   Initial Exercise Prescription:  Initial Exercise Prescription - 12/10/19 0900      Date of Initial Exercise RX and Referring Provider   Date 12/10/19    Referring Provider Belva Crome, MD    Expected Discharge Date 01/01/20      Treadmill   MPH 3    Grade 0    Minutes 15    METs 3.3      NuStep   Level 3    SPM 85    Minutes 15    METs 2.5      Prescription Details   Frequency (times per week) 2    Duration Progress to 30 minutes of continuous aerobic without signs/symptoms of physical distress      Intensity   THRR 40-80% of Max Heartrate 62-123    Ratings of Perceived Exertion 11-13    Perceived Dyspnea 0-4      Progression   Progression Continue to progress workloads to maintain intensity without signs/symptoms of physical distress.      Resistance Training   Training Prescription Yes    Weight 4 lbs    Reps 10-15           Perform Capillary Blood Glucose checks as needed.  Exercise Prescription Changes:  Exercise Prescription Changes    Row Name 12/16/19 0724             Response to Exercise   Blood Pressure (Admit) 98/64       Blood Pressure (Exercise) 130/80       Blood Pressure (Exit) 110/74       Heart Rate (Admit) 71 bpm       Heart Rate (Exercise) 109 bpm       Heart Rate (Exit) 71 bpm       Rating of Perceived Exertion (Exercise) 9       Symptoms none       Comments Off to a good start with exercise.       Duration Progress to 30 minutes of  aerobic without signs/symptoms of physical distress       Intensity THRR unchanged               Progression   Progression Continue to progress workloads to maintain intensity without  signs/symptoms of physical distress.       Average METs 3.1               Resistance Training   Training Prescription Yes       Weight 4 lbs       Reps 10-15       Time 10 Minutes               Interval Training   Interval Training No               Treadmill   MPH 3       Grade 0       Minutes 7       METs 3.3               NuStep   Level 3       SPM 85       Minutes 15       METs 3              Exercise Comments:  Exercise Comments    Row Name 12/16/19 0806           Exercise Comments Patient tolerated 1st exercise session well without symptoms.              Exercise Goals and Review:  Exercise Goals    Row Name 12/10/19 0804             Exercise Goals   Increase Physical Activity Yes       Intervention Provide advice, education, support and counseling about physical activity/exercise needs.;Develop an individualized exercise prescription for aerobic and resistive training based on initial evaluation findings, risk stratification, comorbidities and participant's personal goals.       Expected Outcomes Short Term: Attend rehab on a regular basis to increase amount of physical activity.;Long Term: Add in home exercise to make exercise part of routine and to increase amount of physical activity.;Long Term: Exercising regularly at least 3-5 days a week.       Increase Strength and Stamina Yes       Intervention Provide advice, education, support and counseling about physical activity/exercise needs.;Develop an individualized exercise prescription for aerobic and resistive training based on initial evaluation findings, risk stratification, comorbidities and participant's personal goals.       Expected Outcomes Short Term: Increase workloads from initial exercise prescription for resistance, speed, and METs.;Short Term: Perform resistance training exercises routinely during rehab and add in resistance training at home;Long Term: Improve cardiorespiratory fitness,  muscular endurance and strength as measured by increased METs and functional capacity (6MWT)       Able to understand and use rate of perceived exertion (RPE) scale Yes       Intervention Provide education and explanation on how to use RPE scale       Expected Outcomes Short Term: Able to use RPE daily in rehab to express subjective intensity level;Long Term:  Able to use RPE to guide intensity level when exercising independently       Knowledge and understanding of Target Heart Rate Range (THRR) Yes       Intervention Provide education and explanation of THRR including how the numbers were predicted and where they are  located for reference       Expected Outcomes Short Term: Able to state/look up THRR;Long Term: Able to use THRR to govern intensity when exercising independently;Short Term: Able to use daily as guideline for intensity in rehab       Able to check pulse independently Yes       Intervention Provide education and demonstration on how to check pulse in carotid and radial arteries.;Review the importance of being able to check your own pulse for safety during independent exercise       Expected Outcomes Short Term: Able to explain why pulse checking is important during independent exercise;Long Term: Able to check pulse independently and accurately       Understanding of Exercise Prescription Yes       Intervention Provide education, explanation, and written materials on patient's individual exercise prescription       Expected Outcomes Short Term: Able to explain program exercise prescription;Long Term: Able to explain home exercise prescription to exercise independently              Exercise Goals Re-Evaluation :  Exercise Goals Re-Evaluation    Row Name 12/16/19 0806             Exercise Goal Re-Evaluation   Exercise Goals Review Able to understand and use rate of perceived exertion (RPE) scale;Increase Physical Activity       Comments Patient able to understand and use RPE  scale appropriately.       Expected Outcomes Progress workloads as tolerated to help achieve personal health and fitness goals.              Discharge Exercise Prescription (Final Exercise Prescription Changes):  Exercise Prescription Changes - 12/16/19 0724      Response to Exercise   Blood Pressure (Admit) 98/64    Blood Pressure (Exercise) 130/80    Blood Pressure (Exit) 110/74    Heart Rate (Admit) 71 bpm    Heart Rate (Exercise) 109 bpm    Heart Rate (Exit) 71 bpm    Rating of Perceived Exertion (Exercise) 9    Symptoms none    Comments Off to a good start with exercise.    Duration Progress to 30 minutes of  aerobic without signs/symptoms of physical distress    Intensity THRR unchanged      Progression   Progression Continue to progress workloads to maintain intensity without signs/symptoms of physical distress.    Average METs 3.1      Resistance Training   Training Prescription Yes    Weight 4 lbs    Reps 10-15    Time 10 Minutes      Interval Training   Interval Training No      Treadmill   MPH 3    Grade 0    Minutes 7    METs 3.3      NuStep   Level 3    SPM 85    Minutes 15    METs 3           Nutrition:  Target Goals: Understanding of nutrition guidelines, daily intake of sodium 1500mg , cholesterol 200mg , calories 30% from fat and 7% or less from saturated fats, daily to have 5 or more servings of fruits and vegetables.  Biometrics:  Pre Biometrics - 12/10/19 0726      Pre Biometrics   Waist Circumference 39.5 inches    Hip Circumference 40 inches    Waist to Hip Ratio 0.99 %  Triceps Skinfold 13 mm    % Body Fat 27.4 %    Grip Strength 40.5 kg    Flexibility 0 in    Single Leg Stand 24.81 seconds            Nutrition Therapy Plan and Nutrition Goals:   Nutrition Assessments:  MEDIFICTS Score Key:  ?70 Need to make dietary changes   40-70 Heart Healthy Diet  ? 40 Therapeutic Level Cholesterol Diet    Picture Your  Plate Scores:  <50 Unhealthy dietary pattern with much room for improvement.  41-50 Dietary pattern unlikely to meet recommendations for good health and room for improvement.  51-60 More healthful dietary pattern, with some room for improvement.   >60 Healthy dietary pattern, although there may be some specific behaviors that could be improved.    Nutrition Goals Re-Evaluation:   Nutrition Goals Re-Evaluation:   Nutrition Goals Discharge (Final Nutrition Goals Re-Evaluation):   Psychosocial: Target Goals: Acknowledge presence or absence of significant depression and/or stress, maximize coping skills, provide positive support system. Participant is able to verbalize types and ability to use techniques and skills needed for reducing stress and depression.  Initial Review & Psychosocial Screening:  Initial Psych Review & Screening - 12/10/19 2774      Initial Review   Current issues with History of Depression;Current Anxiety/Panic;Current Stress Concerns    Source of Stress Concerns Unable to perform yard/household activities;Chronic Illness      Family Dynamics   Good Support System? Yes   Dylan Lane has his brother for support     Barriers   Psychosocial barriers to participate in program The patient should benefit from training in stress management and relaxation.      Screening Interventions   Interventions Encouraged to exercise    Expected Outcomes Long Term Goal: Stressors or current issues are controlled or eliminated.;Short Term goal: Identification and review with participant of any Quality of Life or Depression concerns found by scoring the questionnaire.;Long Term goal: The participant improves quality of Life and PHQ9 Scores as seen by post scores and/or verbalization of changes           Quality of Life Scores:  Quality of Life - 12/10/19 0855      Quality of Life   Select Quality of Life      Quality of Life Scores   Health/Function Pre 25.04 %     Socioeconomic Pre 30 %    Psych/Spiritual Pre 30 %    Family Pre 30 %    GLOBAL Pre 27.92 %          Scores of 19 and below usually indicate a poorer quality of life in these areas.  A difference of  2-3 points is a clinically meaningful difference.  A difference of 2-3 points in the total score of the Quality of Life Index has been associated with significant improvement in overall quality of life, self-image, physical symptoms, and general health in studies assessing change in quality of life.  PHQ-9: Recent Review Flowsheet Data    Depression screen [facility] 2/9 12/10/2019 12/06/2018 07/16/2018 11/03/2017 09/07/2017   Decreased Interest 0 0 0 0 1   Down, Depressed, Hopeless 1  0 0 0 0   PHQ - 2 Score 1 0 0 0 1   Altered sleeping - 1  - - 0   Tired, decreased energy - 0 - - 0   Change in appetite - 2 - - 0   Feeling bad or failure  about yourself  - 0 - - 0   Trouble concentrating - 0 - - 0   Moving slowly or fidgety/restless - 0 - - 0   Suicidal thoughts - 0 - - 0   PHQ-9 Score - 3 - - 1   Difficult doing work/chores - Not difficult at all - - Not difficult at all     Interpretation of Total Score  Total Score Depression Severity:  1-4 = Minimal depression, 5-9 = Mild depression, 10-14 = Moderate depression, 15-19 = Moderately severe depression, 20-27 = Severe depression   Psychosocial Evaluation and Intervention:  Psychosocial Evaluation - 12/16/19 1158      Psychosocial Evaluation & Interventions   Interventions Stress management education;Encouraged to exercise with the program and follow exercise prescription    Comments Dylan Lane admits to a history of depression and anxiety. His symptoms are currently managed without medications. He uses exercise (walking) as a stress reducer. He admits to having a strong support system of family and friends. He has a positive outlook and attitude. Denies needs for psychosocial interventions related to stress and anxiety at this time. His greatest  source of stress is his inablilty to do physical chores and his current health status.    Expected Outcomes Dylan Lane will verbalize stress and anxiey symptom reduction as he grows more confident in his ability to manage his CAD. He will continue to have a positive attitude and outlook.    Continue Psychosocial Services  Follow up required by staff           Psychosocial Re-Evaluation:  Psychosocial Re-Evaluation    [place] Name 12/16/19 1213             Psychosocial Re-Evaluation   Comments Dylan Lane admits to a history of depression and anxiety. His symptoms are currently managed without medications. He uses exercise (walking) as a stress reducer. He admits to having a strong support system of family and friends. He has a positive outlook and attitude. Denies needs for psychosocial interventions related to stress and anxiety at this time. His greatest source of stress is his inablilty to do physical chores and his current health status.       Expected Outcomes Dylan Lane will verbalize stress and anxiey symptom reduction as he grows more confident in his ability to manage his CAD. He will continue to have a positive attitude and outlook.       Interventions Encouraged to attend Cardiac Rehabilitation for the exercise;Stress management education       Continue Psychosocial Services  Follow up required by staff              Psychosocial Discharge (Final Psychosocial Re-Evaluation):  Psychosocial Re-Evaluation - 12/16/19 1213      Psychosocial Re-Evaluation   Comments Dylan Lane admits to a history of depression and anxiety. His symptoms are currently managed without medications. He uses exercise (walking) as a stress reducer. He admits to having a strong support system of family and friends. He has a positive outlook and attitude. Denies needs for psychosocial interventions related to stress and anxiety at this time. His greatest source of stress is his inablilty to do physical chores and  his current health status.    Expected Outcomes Dylan Lane will verbalize stress and anxiey symptom reduction as he grows more confident in his ability to manage his CAD. He will continue to have a positive attitude and outlook.    Interventions Encouraged to attend Cardiac Rehabilitation for the  exercise;Stress management education    Continue Psychosocial Services  Follow up required by staff           Vocational Rehabilitation: Provide vocational rehab assistance to qualifying candidates.   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 Participant will state understanding/return demonstration of topics presented.  Learning Barriers/Preferences:  Learning Barriers/Preferences - 12/10/19 0935      Learning Barriers/Preferences   Learning Barriers Sight   wears glasses   Learning Preferences Video;Pictoral           Education Topics: Count Your Pulse:  -Group instruction provided by verbal instruction, demonstration, patient participation and written materials to support subject.  Instructors address importance of being able to find your pulse and how to count your pulse when at home without a heart monitor.  Patients get hands on experience counting their pulse with staff help and individually.   Heart Attack, Angina, and Risk Factor Modification:  -Group instruction provided by verbal instruction, video, and written materials to support subject.  Instructors address signs and symptoms of angina and heart attacks.    Also discuss risk factors for heart disease and how to make changes to improve heart health risk factors.   Functional  Fitness:  -Group instruction provided by verbal instruction, demonstration, patient participation, and written materials to support subject.  Instructors address safety measures for doing things around the house.  Discuss how to get up and down off the floor, how to pick things up properly, how to safely get out of a chair without assistance, and balance training.   Meditation and Mindfulness:  -Group instruction provided by verbal instruction, patient participation, and written materials to support subject.  Instructor addresses importance of mindfulness and meditation practice to help reduce stress and improve awareness.  Instructor also leads participants through a meditation exercise.    Stretching for Flexibility and Mobility:  -Group instruction provided by verbal instruction, patient participation, and written materials to support subject.  Instructors lead participants through series of stretches that are designed to increase flexibility thus improving mobility.  These stretches are additional exercise for major muscle groups that are typically performed during regular warm up and cool down.   Hands Only CPR:  -Group verbal, video, and participation provides a basic overview of AHA guidelines for community CPR. Role-play of emergencies allow participants the opportunity to practice calling for help and chest compression technique with discussion of AED use.   Hypertension: -Group verbal and written instruction that provides a basic overview of hypertension including the most recent diagnostic guidelines, risk factor reduction with self-care instructions and medication management.    Nutrition I class: Heart Healthy Eating:  -Group instruction provided by PowerPoint slides, verbal discussion, and written materials to support subject matter. The instructor gives an explanation and review of the Therapeutic Lifestyle Changes diet recommendations, which includes a discussion on lipid goals,  dietary fat, sodium, fiber, plant stanol/sterol esters, sugar, and the components of a well-balanced, healthy diet.   Nutrition II class: Lifestyle Skills:  -Group instruction provided by PowerPoint slides, verbal discussion, and written materials to support subject matter. The instructor gives an  explanation and review of label reading, grocery shopping for heart health, heart healthy recipe modifications, and ways to make healthier choices when eating out.   Diabetes Question & Answer:  -Group instruction provided by PowerPoint slides, verbal discussion, and written materials to support subject matter. The instructor gives an explanation and review of diabetes co-morbidities, pre- and post-prandial blood glucose goals, pre-exercise blood glucose goals, signs, symptoms, and treatment of hypoglycemia and hyperglycemia, and foot care basics.   Diabetes Blitz:  -Group instruction provided by PowerPoint slides, verbal discussion, and written materials to support subject matter. The instructor gives an explanation and review of the physiology behind type 1 and type 2 diabetes, diabetes medications and rational behind using different medications, pre- and post-prandial blood glucose recommendations and Hemoglobin A1c goals, diabetes diet, and exercise including blood glucose guidelines for exercising safely.    Portion Distortion:  -Group instruction provided by PowerPoint slides, verbal discussion, written materials, and food models to support subject matter. The instructor gives an explanation of serving size versus portion size, changes in portions sizes over the last 20 years, and what consists of a serving from each food group.   Stress Management:  -Group instruction provided by verbal instruction, video, and written materials to support subject matter.  Instructors review role of stress in heart disease and how to cope with stress positively.     Exercising on Your Own:  -Group instruction  provided by verbal instruction, power point, and written materials to support subject.  Instructors discuss benefits of exercise, components of exercise, frequency and intensity of exercise, and end points for exercise.  Also discuss use of nitroglycerin and activating EMS.  Review options of places to exercise outside of rehab.  Review guidelines for sex with heart disease.   Cardiac Drugs I:  -Group instruction provided by verbal instruction and written materials to support subject.  Instructor reviews cardiac drug classes: antiplatelets, anticoagulants, beta blockers, and statins.  Instructor discusses reasons, side effects, and lifestyle considerations for each drug class.   Cardiac Drugs II:  -Group instruction provided by verbal instruction and written materials to support subject.  Instructor reviews cardiac drug classes: angiotensin converting enzyme inhibitors (ACE-I), angiotensin II receptor blockers (ARBs), nitrates, and calcium channel blockers.  Instructor discusses reasons, side effects, and lifestyle considerations for each drug class.   Anatomy and Physiology of the Circulatory System:  Group verbal and written instruction and models provide basic cardiac anatomy and physiology, with the coronary electrical and arterial systems. Review of: AMI, Angina, Valve disease, Heart Failure, Peripheral Artery Disease, Cardiac Arrhythmia, Pacemakers, and the ICD.   Other Education:  -Group or individual verbal, written, or video instructions that support the educational goals of the cardiac rehab program.   Holiday Eating Survival Tips:  -Group instruction provided by PowerPoint slides, verbal discussion, and written materials to support subject matter. The instructor gives patients tips, tricks, and techniques to help them not only survive but enjoy the holidays despite the onslaught of food that accompanies the holidays.   Knowledge Questionnaire Score:  Knowledge Questionnaire Score -  12/10/19 0853      Knowledge Questionnaire Score   Pre Score 4/24           Core Components/Risk Factors/Patient Goals at Admission:  Personal Goals and Risk Factors at Admission - 12/10/19 0939      Core Components/Risk Factors/Patient Goals on Admission   Expected Outcomes Short Term: Continued assessment and intervention until BP is < 140/21mm HG in hypertensive participants. <  130/81mm HG in hypertensive participants with diabetes, heart failure or chronic kidney disease.;Long Term: Maintenance of blood pressure at goal levels.    Lipids Yes    Intervention Provide education and support for participant on nutrition & aerobic/resistive exercise along with prescribed medications to achieve LDL 70mg , HDL >40mg .    Expected Outcomes Short Term: Participant states understanding of desired cholesterol values and is compliant with medications prescribed. Participant is following exercise prescription and nutrition guidelines.;Long Term: Cholesterol controlled with medications as prescribed, with individualized exercise RX and with personalized nutrition plan. Value goals: LDL < 70mg , HDL > 40 mg.    Stress Yes    Intervention Offer individual and/or small group education and counseling on adjustment to heart disease, stress management and health-related lifestyle change. Teach and support self-help strategies.    Expected Outcomes Short Term: Participant demonstrates changes in health-related behavior, relaxation and other stress management skills, ability to obtain effective social support, and compliance with psychotropic medications if prescribed.;Long Term: Emotional wellbeing is indicated by absence of clinically significant psychosocial distress or social isolation.           Core Components/Risk Factors/Patient Goals Review:   Goals and Risk Factor Review    Row Name 12/16/19 1214             Core Components/Risk Factors/Patient Goals Review   Personal Goals Review Weight  Management/Obesity;Lipids;Stress;Hypertension       Review Dylan Lane has multiple CAD risk factors. He is eager to a participate in cardiac rehab for modification and reduction of RF. He acknolodges taking medications as prescribed, walking daily, and adhering to medical provider appointments. He is eager to meet with RD for diet modifications to reduce his weight and lipids. Todays weight 188.5, patients goal weight 150-185. Todays pre-exercise resting BP 98/64 with exertional BP 130/70 and 130/80. Admits to stress however feels as his health improves so will his stress level. Anticipte to see risk factor reductions and modifications over the next 30 days as well as goal progression.       Expected Outcomes Patient will continue to participate in CR for risk factor reduction and exericse.              Core Components/Risk Factors/Patient Goals at Discharge (Final Review):   Goals and Risk Factor Review - 12/16/19 1214      Core Components/Risk Factors/Patient Goals Review   Personal Goals Review Weight Management/Obesity;Lipids;Stress;Hypertension    Review Dylan Lane has multiple CAD risk factors. He is eager to a participate in cardiac rehab for modification and reduction of RF. He acknolodges taking medications as prescribed, walking daily, and adhering to medical provider appointments. He is eager to meet with RD for diet modifications to reduce his weight and lipids. Todays weight 188.5, patients goal weight 150-185. Todays pre-exercise resting BP 98/64 with exertional BP 130/70 and 130/80. Admits to stress however feels as his health improves so will his stress level. Anticipte to see risk factor reductions and modifications over the next 30 days as well as goal progression.    Expected Outcomes Patient will continue to participate in CR for risk factor reduction and exericse.           ITP Comments:  ITP Comments    Row Name 12/10/19 0727 12/16/19 1154         ITP Comments Medical  Director- Dr. Fransico Him, MD Dylan Lane completed his first cardiac rehab exercise session today and tolerated well. Denied complaints. Worked to an RPE  of 9. Will re-evaluate workload on Wednesday as patient should be working to an RPE of 11-13. VSS. Mets 3.0 to 3.3. Dylan Lane is eager to participate in cardiac rehab. His personal goals are weight loss with his goal weight between 150-185. He also would like to improve his nutrition and eat a more heart healthy diet. Expect to see progression towards goals over the next 30 days.             Comments: see ITP comments

## 2019-12-18 ENCOUNTER — Encounter (HOSPITAL_COMMUNITY): Payer: Medicare HMO

## 2019-12-18 ENCOUNTER — Other Ambulatory Visit: Payer: Self-pay

## 2019-12-18 ENCOUNTER — Ambulatory Visit (HOSPITAL_COMMUNITY): Payer: Medicare HMO

## 2019-12-19 ENCOUNTER — Ambulatory Visit (HOSPITAL_COMMUNITY): Admission: RE | Admit: 2019-12-19 | Payer: Medicare HMO | Source: Ambulatory Visit

## 2019-12-23 ENCOUNTER — Ambulatory Visit (HOSPITAL_COMMUNITY): Payer: Medicare HMO

## 2019-12-23 ENCOUNTER — Encounter (HOSPITAL_COMMUNITY): Payer: Medicare HMO

## 2019-12-24 ENCOUNTER — Ambulatory Visit: Payer: Medicare HMO | Admitting: Cardiology

## 2019-12-24 ENCOUNTER — Encounter: Payer: Self-pay | Admitting: Cardiology

## 2019-12-24 ENCOUNTER — Other Ambulatory Visit: Payer: Self-pay

## 2019-12-24 ENCOUNTER — Telehealth (HOSPITAL_COMMUNITY): Payer: Self-pay | Admitting: *Deleted

## 2019-12-24 ENCOUNTER — Ambulatory Visit: Payer: Medicare HMO | Admitting: Nurse Practitioner

## 2019-12-24 ENCOUNTER — Ambulatory Visit (HOSPITAL_COMMUNITY): Payer: Medicare HMO

## 2019-12-24 VITALS — BP 114/74 | HR 53 | Ht 67.0 in | Wt 188.8 lb

## 2019-12-24 DIAGNOSIS — I214 Non-ST elevation (NSTEMI) myocardial infarction: Secondary | ICD-10-CM

## 2019-12-24 DIAGNOSIS — I10 Essential (primary) hypertension: Secondary | ICD-10-CM | POA: Diagnosis not present

## 2019-12-24 DIAGNOSIS — E785 Hyperlipidemia, unspecified: Secondary | ICD-10-CM

## 2019-12-24 DIAGNOSIS — I2510 Atherosclerotic heart disease of native coronary artery without angina pectoris: Secondary | ICD-10-CM

## 2019-12-24 DIAGNOSIS — Z9861 Coronary angioplasty status: Secondary | ICD-10-CM

## 2019-12-24 MED ORDER — PANTOPRAZOLE SODIUM 40 MG PO TBEC
40.0000 mg | DELAYED_RELEASE_TABLET | Freq: Every day | ORAL | 0 refills | Status: DC
Start: 2019-12-24 — End: 2020-01-29
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
## 2019-12-24 NOTE — Patient Instructions (Signed)
Medication Instructions:  Continue current medications  *If you need a refill on your cardiac medications before your next appointment, please call your pharmacy*   Lab Work: Fasting Lipid and BMP  If you have labs (blood work) drawn today and your tests are completely normal, you will receive your results only by:  [place] (if you have MyChart) OR  A paper copy in the mail If you have any lab test that is abnormal or we need to change your treatment, we will call you to review the results.   Testing/Procedures: None Ordered   Follow-Up: At [facility], you and your health needs are our priority.  As part of our continuing mission to provide you with exceptional heart care, we have created designated Provider Care Teams.  These Care Teams include your primary Cardiologist (physician) and Advanced Practice Providers (APPs -  Physician Assistants and Nurse Practitioners) who all work together to provide you with the care you need, when you need it.  We recommend signing up for the patient portal called "MyChart".  Sign up information is provided on this After Visit Summary.  MyChart is used to connect with patients for Virtual Visits (Telemedicine).  Patients are able to view lab/test results, encounter notes, upcoming appointments, etc.  Non-urgent messages can be sent to your provider as well.   To learn more about what you can do with MyChart, go to [email or web address].    Your next appointment:   6 month(s)  The format for your next appointment:   In Person  Provider:   You may see Sinclair Grooms, MD or one of the following Advanced Practice Providers on your designated Care Team:

## 2019-12-24 NOTE — Telephone Encounter (Signed)
Patient has been absent from cardiac rehab since first session on December 16, 2019. Contacted patient to inquire if he plans to continue in the program. Patient states that he has been out due to other appointments but plans to return to exercise tomorrow.   Sol Passer, MS, ACSM CEP

## 2019-12-25 ENCOUNTER — Encounter (HOSPITAL_COMMUNITY): Payer: Medicare HMO

## 2019-12-26 ENCOUNTER — Ambulatory Visit: Payer: Medicare HMO | Admitting: Family Medicine

## 2019-12-30 ENCOUNTER — Encounter (HOSPITAL_COMMUNITY): Payer: Medicare HMO

## 2019-12-30 ENCOUNTER — Other Ambulatory Visit: Payer: Self-pay

## 2019-12-30 ENCOUNTER — Ambulatory Visit (HOSPITAL_COMMUNITY)
Admission: RE | Admit: 2019-12-30 | Discharge: 2019-12-30 | Disposition: A | Discharge status: Home / Self Care | Payer: Medicare HMO | Source: Ambulatory Visit | Attending: Nurse Practitioner | Admitting: Nurse Practitioner

## 2019-12-30 ENCOUNTER — Ambulatory Visit (HOSPITAL_COMMUNITY): Payer: Medicare HMO

## 2019-12-30 DIAGNOSIS — K117 Disturbances of salivary secretion: Secondary | ICD-10-CM | POA: Insufficient documentation

## 2019-12-30 DIAGNOSIS — K219 Gastro-esophageal reflux disease without esophagitis: Secondary | ICD-10-CM | POA: Diagnosis not present

## 2019-12-31 ENCOUNTER — Telehealth: Payer: Self-pay | Admitting: Nurse Practitioner

## 2019-12-31 ENCOUNTER — Telehealth: Payer: Self-pay | Admitting: Adult Health

## 2019-12-31 ENCOUNTER — Ambulatory Visit (HOSPITAL_COMMUNITY): Payer: Medicare HMO

## 2019-12-31 NOTE — Telephone Encounter (Signed)
Patient calling for barium swallow test results

## 2019-12-31 NOTE — Telephone Encounter (Signed)
Patient is calling and wanted to see if someone could go over his results that he received from [facility], please advise. CB is [phone]

## 2019-12-31 NOTE — Telephone Encounter (Signed)
Spoke with the pt and he stated he had an upper GI study yesterday and cannot view the results from his Mychart.  I advised the pt to contact [facility] at [phone] as their office ordered the test and he agreed.

## 2019-12-31 NOTE — Telephone Encounter (Signed)
The pt has been advised and aware we will contact him as soon as reviewed.

## 2020-01-01 ENCOUNTER — Encounter (HOSPITAL_COMMUNITY): Payer: Medicare HMO

## 2020-01-06 ENCOUNTER — Ambulatory Visit (HOSPITAL_COMMUNITY): Payer: Medicare HMO

## 2020-01-06 ENCOUNTER — Telehealth (HOSPITAL_COMMUNITY): Payer: Self-pay | Admitting: *Deleted

## 2020-01-06 ENCOUNTER — Encounter (HOSPITAL_COMMUNITY): Payer: Medicare HMO

## 2020-01-07 ENCOUNTER — Ambulatory Visit (HOSPITAL_COMMUNITY): Payer: Medicare HMO

## 2020-01-08 ENCOUNTER — Encounter (HOSPITAL_COMMUNITY): Payer: Medicare HMO

## 2020-01-09 ENCOUNTER — Telehealth: Payer: Self-pay | Admitting: Adult Health

## 2020-01-09 NOTE — Telephone Encounter (Signed)
Pt is calling in to see if the report has come back from the imaging that he had on 12/30/2019.  Pt would like to have a call back.

## 2020-01-09 NOTE — Telephone Encounter (Signed)
Overview of the result was give to the pt per [facility].  Pt notified that someone from Dr. Lauro Franklin office will call and go into detail on the next steps and what happens during an upper endoscopy.  Pt is very anxious.  Gave him the number to Dr. Lauro Franklin office.  He will call to see if someone is available to talk with him.  He will call back if anything further is needed.

## 2020-01-09 NOTE — Telephone Encounter (Signed)
Please send to [facility] nurse.  Barbaraann Rondo I believe.  ( I was only helping in this box when someone was off)

## 2020-01-09 NOTE — Telephone Encounter (Signed)
Pt is checking on the status of his results.

## 2020-01-10 ENCOUNTER — Telehealth (HOSPITAL_COMMUNITY): Payer: Self-pay | Admitting: *Deleted

## 2020-01-10 ENCOUNTER — Encounter (HOSPITAL_COMMUNITY): Payer: Self-pay | Admitting: *Deleted

## 2020-01-10 ENCOUNTER — Telehealth: Payer: Self-pay | Admitting: Nurse Practitioner

## 2020-01-10 DIAGNOSIS — Z955 Presence of coronary angioplasty implant and graft: Secondary | ICD-10-CM

## 2020-01-10 NOTE — Telephone Encounter (Signed)
Contacted patient regarding absence from cardiac rehab since 12/16/2019. Patient states that he has had multiple doctor's appointments that have conflicted with his cardiac rehab sessions, and that's why he's been unable to attend. Patient plans to return to cardiac rehab on Monday 01/13/2020. I made patient aware that if he's unable to attend next week, we will have to discharge him from the program as he will have been out over a month. Patient verbalizes understanding.   Sol Passer, MS, ACSM Juanito Doom 01/10/[phone]

## 2020-01-10 NOTE — Telephone Encounter (Signed)
Spoke to patient this morning to go over 12/29/20 esophagram results and Dr Vena Rua recommendations for an EGD in the [place] proceed with upper endoscopy without interrupting his antiplatelet therapy. Patient continues taking his Pantoprazole 40 mg daily and reports good relief. Patient has agreed to proceed with upper endoscopy without interrupting Plavix. He understands that without interrupting Plavix may limit therapeutic modalities but would at least allow for observation so that Dr Hilarie Fredrickson can determine if any further treatment is necessary.  He is scheduled for a nurse visit on 01/23/20 to go over instructions,then EGD on 01/29/20. All questions answered. Patient voiced understanding.

## 2020-01-10 NOTE — Progress Notes (Signed)
Cardiac Individual Treatment Plan  Patient Details  Name: William Louis MRN: [id number] Date of Birth: 1950-08-16 Referring Provider:   Flowsheet Row CARDIAC REHAB PHASE II ORIENTATION from 12/10/2019 in [facility]  Referring Provider Belva Crome, MD      Initial Encounter Date:  [place] PHASE II ORIENTATION from 12/10/2019 in [facility]  Date 12/10/19      Visit Diagnosis: 10/21/19 DES distal Cfx  Patient's Home Medications on Admission:  Current Outpatient Medications:    acetaminophen (TYLENOL) 325 MG tablet, Take 650 mg by mouth every 6 (six) hours as needed for mild pain or headache., Disp: , Rfl:    amLODipine (NORVASC) 10 MG tablet, Take 1 tablet by mouth once daily, Disp: 90 tablet, Rfl: 0   aspirin 81 MG EC tablet, Take 1 tablet (81 mg total) by mouth daily., Disp: 30 tablet, Rfl: 11   atorvastatin (LIPITOR) 80 MG tablet, Take 1 tablet (80 mg total) by mouth daily., Disp: 90 tablet, Rfl: 3   clopidogrel (PLAVIX) 75 MG tablet, Take 1 tablet (75 mg total) by mouth daily., Disp: 94 tablet, Rfl: 4   metoprolol tartrate (LOPRESSOR) 25 MG tablet, Take 25 mg by mouth daily., Disp: , Rfl:    nitroGLYCERIN (NITROSTAT) 0.4 MG SL tablet, Place 1 tablet (0.4 mg total) under the tongue every 5 (five) minutes x 3 doses as needed for chest pain., Disp: 25 tablet, Rfl: 0   pantoprazole (PROTONIX) 40 MG tablet, Take 1 tablet (40 mg total) by mouth daily., Disp: 30 tablet, Rfl: 0  Past Medical History: Past Medical History:  Diagnosis Date   CAD (coronary artery disease), native coronary artery    a. cath 10/21/19 s/p DES to Lcx, medical therapy for 50% LAD stenosis.   Colon polyps    3/06 and 2009:needs repeat in 3 yrs(3/12)   GERD (gastroesophageal reflux disease) 2017   Hyperlipidemia    Hypertension    Pre-diabetes    with fasting glucose of 110(2/09)   Rectal bleeding     History of    Tobacco Use: Social History   Tobacco Use  Smoking Status Former Smoker   Types: Cigarettes   Quit date: 03/14/2000   Years since quitting: 19.8  Smokeless Tobacco Never Used    Labs: Recent Review Scientist, physiological    Labs for ITP Cardiac and Pulmonary Rehab Latest Ref Rng & Units 06/05/2013 01/22/2015 07/16/2018 03/19/2019 07/16/2019   Cholestrol <200 mg/dL - 174 - - 168   LDLCALC mg/dL (calc) - 114(H) - - 108(H)   HDL > OR = 40 mg/dL - 42 - - 44   Trlycerides <150 mg/dL - 92 - - 69   Hemoglobin A1c <5.7 % of total Hgb 5.8(H) 6.1(H) 6.4(A) 5.6 5.8(H)      Capillary Blood Glucose: Lab Results  Component Value Date   GLUCAP 103 (H) 07/16/2018   GLUCAP 95 12/22/2014   GLUCAP 87 02/05/2014   GLUCAP 101 (H) 06/06/2008     Exercise Target Goals: Exercise Program Goal: Individual exercise prescription set using results from initial 6 min walk test and THRR while considering  patients activity barriers and safety.   Exercise Prescription Goal: Starting with aerobic activity 30 plus minutes a day, 3 days per week for initial exercise prescription. Provide home exercise prescription and guidelines that participant acknowledges understanding prior to discharge.  Activity Barriers & Risk Stratification:  Activity Barriers & Cardiac Risk Stratification -  12/10/19 0803      Activity Barriers & Cardiac Risk Stratification   Activity Barriers None    Cardiac Risk Stratification High           6 Minute Walk:  6 Minute Walk    Row Name 12/10/19 0814         6 Minute Walk   Phase Initial     Distance 1641 feet     Walk Time 6 minutes     # of Rest Breaks 0     MPH 3.11     METS 3.29     RPE 13     Perceived Dyspnea  0     VO2 Peak 11.51     Symptoms No     Resting HR 76 bpm     Resting BP 108/70     Resting Oxygen Saturation  98 %     Exercise Oxygen Saturation  during 6 min walk 97 %     Max Ex. HR 85 bpm     Max Ex. BP 112/78     2 Minute Post BP  98/66            Oxygen Initial Assessment:   Oxygen Re-Evaluation:   Oxygen Discharge (Final Oxygen Re-Evaluation):   Initial Exercise Prescription:  Initial Exercise Prescription - 12/10/19 0900      Date of Initial Exercise RX and Referring Provider   Date 12/10/19    Referring Provider Belva Crome, MD    Expected Discharge Date 01/01/20      Treadmill   MPH 3    Grade 0    Minutes 15    METs 3.3      NuStep   Level 3    SPM 85    Minutes 15    METs 2.5      Prescription Details   Frequency (times per week) 2    Duration Progress to 30 minutes of continuous aerobic without signs/symptoms of physical distress      Intensity   THRR 40-80% of Max Heartrate 62-123    Ratings of Perceived Exertion 11-13    Perceived Dyspnea 0-4      Progression   Progression Continue to progress workloads to maintain intensity without signs/symptoms of physical distress.      Resistance Training   Training Prescription Yes    Weight 4 lbs    Reps 10-15           Perform Capillary Blood Glucose checks as needed.  Exercise Prescription Changes:   Exercise Prescription Changes    Row Name 12/16/19 0724 01/13/20 0704           Response to Exercise   Blood Pressure (Admit) 98/64 98/70      Blood Pressure (Exercise) 130/80 126/84      Blood Pressure (Exit) 110/74 112/82      Heart Rate (Admit) 71 bpm 61 bpm      Heart Rate (Exercise) 109 bpm 94 bpm      Heart Rate (Exit) 71 bpm 69 bpm      Rating of Perceived Exertion (Exercise) 9 13      Symptoms none none      Comments Off to a good start with exercise. --      Duration Progress to 30 minutes of  aerobic without signs/symptoms of physical distress Progress to 30 minutes of  aerobic without signs/symptoms of physical distress      Intensity THRR unchanged THRR unchanged  Progression   Progression Continue to progress workloads to maintain intensity without signs/symptoms of physical distress.  Continue to progress workloads to maintain intensity without signs/symptoms of physical distress.      Average METs 3.1 3.1             Resistance Training   Training Prescription Yes Yes      Weight 4 lbs 4 lbs      Reps 10-15 10-15      Time 10 Minutes 10 Minutes             Interval Training   Interval Training No No             Treadmill   MPH 3 3      Grade 0 0      Minutes 7 15      METs 3.3 3.3             NuStep   Level 3 3      SPM 85 85      Minutes 15 15      METs 3 2.8             Home Exercise Plan   Plans to continue exercise at -- [employer] [employer] (comment)      Frequency -- Add 4 additional days to program exercise sessions.      Initial Home Exercises Provided -- 01/13/20             Exercise Comments:   Exercise Comments    Row Name 12/16/19 0806 01/13/20 0734         Exercise Comments Patient tolerated 1st exercise session well without symptoms. Reviewed home exercise guidelines and goals with patient.             Exercise Goals and Review:   Exercise Goals    Row Name 12/10/19 0804             Exercise Goals   Increase Physical Activity Yes       Intervention Provide advice, education, support and counseling about physical activity/exercise needs.;Develop an individualized exercise prescription for aerobic and resistive training based on initial evaluation findings, risk stratification, comorbidities and participant's personal goals.       Expected Outcomes Short Term: Attend rehab on a regular basis to increase amount of physical activity.;Long Term: Add in home exercise to make exercise part of routine and to increase amount of physical activity.;Long Term: Exercising regularly at least 3-5 days a week.       Increase Strength and Stamina Yes       Intervention Provide advice, education, support and counseling about physical activity/exercise needs.;Develop an individualized exercise prescription for aerobic and resistive  training based on initial evaluation findings, risk stratification, comorbidities and participant's personal goals.       Expected Outcomes Short Term: Increase workloads from initial exercise prescription for resistance, speed, and METs.;Short Term: Perform resistance training exercises routinely during rehab and add in resistance training at home;Long Term: Improve cardiorespiratory fitness, muscular endurance and strength as measured by increased METs and functional capacity (6MWT)       Able to understand and use rate of perceived exertion (RPE) scale Yes       Intervention Provide education and explanation on how to use RPE scale       Expected Outcomes Short Term: Able to use RPE daily in rehab to express subjective intensity level;Long Term:  Able to use RPE to guide intensity level when  exercising independently       Knowledge and understanding of Target Heart Rate Range (THRR) Yes       Intervention Provide education and explanation of THRR including how the numbers were predicted and where they are located for reference       Expected Outcomes Short Term: Able to state/look up THRR;Long Term: Able to use THRR to govern intensity when exercising independently;Short Term: Able to use daily as guideline for intensity in rehab       Able to check pulse independently Yes       Intervention Provide education and demonstration on how to check pulse in carotid and radial arteries.;Review the importance of being able to check your own pulse for safety during independent exercise       Expected Outcomes Short Term: Able to explain why pulse checking is important during independent exercise;Long Term: Able to check pulse independently and accurately       Understanding of Exercise Prescription Yes       Intervention Provide education, explanation, and written materials on patient's individual exercise prescription       Expected Outcomes Short Term: Able to explain program exercise prescription;Long  Term: Able to explain home exercise prescription to exercise independently              Exercise Goals Re-Evaluation :  Exercise Goals Re-Evaluation    Row Name 12/16/19 0806 01/13/20 0734           Exercise Goal Re-Evaluation   Exercise Goals Review Able to understand and use rate of perceived exertion (RPE) scale;Increase Physical Activity Able to understand and use rate of perceived exertion (RPE) scale;Increase Physical Activity;Increase Strength and Stamina;Knowledge and understanding of Target Heart Rate Range (THRR);Understanding of Exercise Prescription      Comments Patient able to understand and use RPE scale appropriately. Patient's goals are to lose weight, get back in shape, and eat healthier. Patient was previously walking 5-6 laps at local gym daily but has not been back because he wanted to know his parameters. Reviewed home exericse guidelines with patient, and patient plans to resume walking at the gym 30 minutes on the days he doesn't attend cardiac rehab.      Expected Outcomes Progress workloads as tolerated to help achieve personal health and fitness goals. Patient will walk 30 minutes 2-4 days/week in addition to exercise at cardiac rehab to help achieve personal health and fitness goals.              Discharge Exercise Prescription (Final Exercise Prescription Changes):  Exercise Prescription Changes - 01/13/20 0704      Response to Exercise   Blood Pressure (Admit) 98/70    Blood Pressure (Exercise) 126/84    Blood Pressure (Exit) 112/82    Heart Rate (Admit) 61 bpm    Heart Rate (Exercise) 94 bpm    Heart Rate (Exit) 69 bpm    Rating of Perceived Exertion (Exercise) 13    Symptoms none    Duration Progress to 30 minutes of  aerobic without signs/symptoms of physical distress    Intensity THRR unchanged      Progression   Progression Continue to progress workloads to maintain intensity without signs/symptoms of physical distress.    Average METs 3.1       Resistance Training   Training Prescription Yes    Weight 4 lbs    Reps 10-15    Time 10 Minutes      Interval Training   Interval  Training No      Treadmill   MPH 3    Grade 0    Minutes 15    METs 3.3      NuStep   Level 3    SPM 85    Minutes 15    METs 2.8      Home Exercise Plan   Plans to continue exercise at [facility] (comment)    Frequency Add 4 additional days to program exercise sessions.    Initial Home Exercises Provided 01/13/20           Nutrition:  Target Goals: Understanding of nutrition guidelines, daily intake of sodium 1500mg , cholesterol 200mg , calories 30% from fat and 7% or less from saturated fats, daily to have 5 or more servings of fruits and vegetables.  Biometrics:  Pre Biometrics - 12/10/19 0726      Pre Biometrics   Waist Circumference 39.5 inches    Hip Circumference 40 inches    Waist to Hip Ratio 0.99 %    Triceps Skinfold 13 mm    % Body Fat 27.4 %    Grip Strength 40.5 kg    Flexibility 0 in    Single Leg Stand 24.81 seconds            Nutrition Therapy Plan and Nutrition Goals:  Nutrition Therapy & Goals - 01/13/20 1313      Nutrition Therapy   RD appointment deferred Yes           Nutrition Assessments:  MEDIFICTS Score Key:  ?70 Need to make dietary changes   40-70 Heart Healthy Diet  ? 40 Therapeutic Level Cholesterol Diet  Flowsheet Row CARDIAC REHAB PHASE II EXERCISE from 12/16/2019 in [facility]  Picture Your Plate Total Score on Admission 46     Picture Your Plate Scores:  [id number] Unhealthy dietary pattern with much room for improvement.  41-50 Dietary pattern unlikely to meet recommendations for good health and room for improvement.  51-60 More healthful dietary pattern, with some room for improvement.   >60 Healthy dietary pattern, although there may be some specific behaviors that could be improved.    Nutrition Goals  Re-Evaluation:   Nutrition Goals Discharge (Final Nutrition Goals Re-Evaluation):   Psychosocial: Target Goals: Acknowledge presence or absence of significant depression and/or stress, maximize coping skills, provide positive support system. Participant is able to verbalize types and ability to use techniques and skills needed for reducing stress and depression.  Initial Review & Psychosocial Screening:  Initial Psych Review & Screening - 12/10/19 [id number]      Initial Review   Current issues with History of Depression;Current Anxiety/Panic;Current Stress Concerns    Source of Stress Concerns Unable to perform yard/household activities;Chronic Illness      Family Dynamics   Good Support System? Yes   Johnwilliam has his brother for support     Barriers   Psychosocial barriers to participate in program The patient should benefit from training in stress management and relaxation.      Screening Interventions   Interventions Encouraged to exercise    Expected Outcomes Long Term Goal: Stressors or current issues are controlled or eliminated.;Short Term goal: Identification and review with participant of any Quality of Life or Depression concerns found by scoring the questionnaire.;Long Term goal: The participant improves quality of Life and PHQ9 Scores as seen by post scores and/or verbalization of changes           Quality of Life  Scores:  Quality of Life - 12/10/19 0855      Quality of Life   Select Quality of Life      Quality of Life Scores   Health/Function Pre 25.04 %    Socioeconomic Pre 30 %    Psych/Spiritual Pre 30 %    Family Pre 30 %    GLOBAL Pre 27.92 %          Scores of 19 and below usually indicate a poorer quality of life in these areas.  A difference of  2-3 points is a clinically meaningful difference.  A difference of 2-3 points in the total score of the Quality of Life Index has been associated with significant improvement in overall quality of life, self-image,  physical symptoms, and general health in studies assessing change in quality of life.  PHQ-9: Recent Review Flowsheet Data    Depression screen [facility] 2/9 12/10/2019 12/06/2018 07/16/2018 11/03/2017 09/07/2017   Decreased Interest 0 0 0 0 1   Down, Depressed, Hopeless 1  0 0 0 0   PHQ - 2 Score 1 0 0 0 1   Altered sleeping - 1  - - 0   Tired, decreased energy - 0 - - 0   Change in appetite - 2 - - 0   Feeling bad or failure about yourself  - 0 - - 0   Trouble concentrating - 0 - - 0   Moving slowly or fidgety/restless - 0 - - 0   Suicidal thoughts - 0 - - 0   PHQ-9 Score - 3 - - 1   Difficult doing work/chores - Not difficult at all - - Not difficult at all     Interpretation of Total Score  Total Score Depression Severity:  1-4 = Minimal depression, 5-9 = Mild depression, 10-14 = Moderate depression, 15-19 = Moderately severe depression, 20-27 = Severe depression   Psychosocial Evaluation and Intervention:  Psychosocial Evaluation - 12/16/19 1158      Psychosocial Evaluation & Interventions   Interventions Stress management education;Encouraged to exercise with the program and follow exercise prescription    Comments Mr. Axel admits to a history of depression and anxiety. His symptoms are currently managed without medications. He uses exercise (walking) as a stress reducer. He admits to having a strong support system of family and friends. He has a positive outlook and attitude. Denies needs for psychosocial interventions related to stress and anxiety at this time. His greatest source of stress is his inablilty to do physical chores and his current health status.    Expected Outcomes Mr. Obenchain will verbalize stress and anxiey symptom reduction as he grows more confident in his ability to manage his CAD. He will continue to have a positive attitude and outlook.    Continue Psychosocial Services  Follow up required by staff           Psychosocial Re-Evaluation:  Psychosocial  Re-Evaluation    [place] Name 12/16/19 1213 01/10/20 1345           Psychosocial Re-Evaluation   Current issues with -- History of Depression;Current Anxiety/Panic;Current Stress Concerns      Comments Mr. Desilets admits to a history of depression and anxiety. His symptoms are currently managed without medications. He uses exercise (walking) as a stress reducer. He admits to having a strong support system of family and friends. He has a positive outlook and attitude. Denies needs for psychosocial interventions related to stress and anxiety at this time. His  greatest source of stress is his inablilty to do physical chores and his current health status. Unable to reassess as the patient's last day of attendance was on 12/16/19      Expected Outcomes Mr. Goblirsch will verbalize stress and anxiey symptom reduction as he grows more confident in his ability to manage his CAD. He will continue to have a positive attitude and outlook. --      Interventions Encouraged to attend Cardiac Rehabilitation for the exercise;Stress management education Encouraged to attend Cardiac Rehabilitation for the exercise;Stress management education      Continue Psychosocial Services  Follow up required by staff --             Initial Review   Source of Stress Concerns -- Unable to perform yard/household activities;Chronic Illness             Psychosocial Discharge (Final Psychosocial Re-Evaluation):  Psychosocial Re-Evaluation - 01/10/20 1345      Psychosocial Re-Evaluation   Current issues with History of Depression;Current Anxiety/Panic;Current Stress Concerns    Comments Unable to reassess as the patient's last day of attendance was on 12/16/19    Interventions Encouraged to attend Cardiac Rehabilitation for the exercise;Stress management education      Initial Review   Source of Stress Concerns Unable to perform yard/household activities;Chronic Illness           Vocational Rehabilitation: Provide  vocational rehab assistance to qualifying candidates.   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 Participant will state understanding/return demonstration of topics presented.  Learning Barriers/Preferences:  Learning Barriers/Preferences - 12/10/19 0935      Learning Barriers/Preferences   Learning Barriers Sight   wears glasses   Learning Preferences Video;Pictoral           Education Topics: Hypertension, Hypertension Reduction -Define heart disease and high blood pressure. Discus how high blood pressure affects the body and ways to reduce high blood pressure.   Exercise and Your Heart -Discuss why it is important to exercise, the FITT principles of exercise, normal and abnormal responses to exercise, and how to exercise safely.   Angina -Discuss definition of angina, causes of angina, treatment of angina, and how to decrease risk of having angina.   Cardiac Medications -Review what the following cardiac medications are used for, how they affect the body, and side effects that may occur when taking the medications.  Medications include Aspirin, Beta blockers, calcium channel blockers, ACE Inhibitors, angiotensin receptor blockers, diuretics, digoxin, and antihyperlipidemics.   Congestive Heart Failure -Discuss the definition of CHF, how to live with CHF, the signs and symptoms of CHF, and how keep track of weight and sodium intake.   Heart Disease and Intimacy -Discus the effect sexual activity has on the heart, how changes occur during intimacy as we age, and safety during sexual activity.   Smoking Cessation /  COPD -Discuss different methods to quit smoking, the health benefits of quitting smoking, and the definition of COPD.   Nutrition I: Fats -Discuss the types of cholesterol, what cholesterol does to the heart, and how cholesterol levels can be controlled.   Nutrition  II: Labels -Discuss the different components of food labels and how to read food label   Heart Parts/Heart Disease and PAD -Discuss the anatomy of the heart, the pathway of blood circulation through the heart, and these are affected by heart disease.   Stress I: Signs and Symptoms -Discuss the causes of stress, how stress may lead to anxiety and depression, and ways to limit stress.   Stress II: Relaxation -Discuss different types of relaxation techniques to limit stress.   Warning Signs of Stroke / TIA -Discuss definition of a stroke, what the signs and symptoms are of a stroke, and how to identify when someone is having stroke.   Knowledge Questionnaire Score:  Knowledge Questionnaire Score - 12/10/19 0853      Knowledge Questionnaire Score   Pre Score 4/24           Core Components/Risk Factors/Patient Goals at Admission:  Personal Goals and Risk Factors at Admission - 12/10/19 0939      Core Components/Risk Factors/Patient Goals on Admission   Expected Outcomes Short Term: Continued assessment and intervention until BP is < 140/63mm HG in hypertensive participants. < 130/32mm HG in hypertensive participants with diabetes, heart failure or chronic kidney disease.;Long Term: Maintenance of blood pressure at goal levels.    Lipids Yes    Intervention Provide education and support for participant on nutrition & aerobic/resistive exercise along with prescribed medications to achieve LDL 70mg , HDL >40mg .    Expected Outcomes Short Term: Participant states understanding of desired cholesterol values and is compliant with medications prescribed. Participant is following exercise prescription and nutrition  guidelines.;Long Term: Cholesterol controlled with medications as prescribed, with individualized exercise RX and with personalized nutrition plan. Value goals: LDL < 70mg , HDL > 40 mg.    Stress Yes    Intervention Offer individual and/or small group education and counseling on adjustment to heart disease, stress management and health-related lifestyle change. Teach and support self-help strategies.    Expected Outcomes Short Term: Participant demonstrates changes in health-related behavior, relaxation and other stress management skills, ability to obtain effective social support, and compliance with psychotropic medications if prescribed.;Long Term: Emotional wellbeing is indicated by absence of clinically significant psychosocial distress or social isolation.           Core Components/Risk Factors/Patient Goals Review:   Goals and Risk Factor Review    Row Name 12/16/19 1214 01/10/20 1347 01/14/20 1400         Core Components/Risk Factors/Patient Goals Review   Personal Goals Review Weight Management/Obesity;Lipids;Stress;Hypertension Weight Management/Obesity;Lipids;Stress;Hypertension Weight Management/Obesity;Lipids;Stress;Hypertension     Review Mr. Samberg has multiple CAD risk factors. He is eager to a participate in cardiac rehab for modification and reduction of RF. He acknolodges taking medications as prescribed, walking daily, and adhering to medical provider appointments. He is eager to meet with RD for diet modifications to reduce his weight and lipids. Todays weight 188.5, patients goal weight 150-185. Todays pre-exercise resting BP 98/64 with exertional BP 130/70 and 130/80. Admits to stress however feels as his health improves so will his stress level. Anticipte to see risk factor reductions and modifications over the next 30 days as well as goal progression. Jhony has only attended one exercise session on 12/16/19. Goerge returned to exercise on 01/13/20 and did well with exercise.      Expected Outcomes Patient will continue to participate in CR for risk factor reduction and exericse. Patient will continue to participate in CR for risk factor reduction and exericse. Patient will  continue to participate in CR for risk factor reduction and exericse.            Core Components/Risk Factors/Patient Goals at Discharge (Final Review):   Goals and Risk Factor Review - 01/14/20 1400      Core Components/Risk Factors/Patient Goals Review   Personal Goals Review Weight Management/Obesity;Lipids;Stress;Hypertension    Review Goerge returned to exercise on 01/13/20 and did well with exercise.    Expected Outcomes Patient will continue to participate in CR for risk factor reduction and exericse.           ITP Comments:  ITP Comments    Row Name 12/10/19 0727 12/16/19 1154 01/10/20 1342 01/14/20 1402     ITP Comments Medical Director- Dr. Fransico Him, MD Mr. Gildea completed his first cardiac rehab exercise session today and tolerated well. Denied complaints. Worked to an RPE of 9. Will re-evaluate workload on Wednesday as patient should be working to an RPE of 11-13. VSS. Mets 3.0 to 3.3. Mr Bergum is eager to participate in cardiac rehab. His personal goals are weight loss with his goal weight between 150-185. He also would like to improve his nutrition and eat a more heart healthy diet. Expect to see progression towards goals over the next 30 days. Estol has only attended one exercise session since starting cardiac rehab on 12/16/19 30 Day ITP Review Jamesedward returned to exercise on 01/13/20 after being absent since 12/16/19.           Comments: See ITP comments. Tyrus returned for his second day of exercise on 01/13/20 and did well.Barnet Pall, RN,BSN 01/14/2020 2:04 PM

## 2020-01-10 NOTE — Telephone Encounter (Signed)
Already spoke to patient to schedule procedure this morning.

## 2020-01-11 ENCOUNTER — Other Ambulatory Visit: Payer: Self-pay

## 2020-01-11 ENCOUNTER — Other Ambulatory Visit: Payer: Medicare HMO

## 2020-01-11 DIAGNOSIS — Z20822 Contact with and (suspected) exposure to covid-19: Secondary | ICD-10-CM | POA: Diagnosis not present

## 2020-01-13 ENCOUNTER — Other Ambulatory Visit: Payer: Self-pay

## 2020-01-13 ENCOUNTER — Ambulatory Visit (HOSPITAL_COMMUNITY): Payer: Medicare HMO

## 2020-01-13 ENCOUNTER — Encounter (HOSPITAL_COMMUNITY)
Admission: RE | Admit: 2020-01-13 | Discharge: 2020-01-13 | Disposition: A | Discharge status: Home / Self Care | Payer: Medicare HMO | Source: Ambulatory Visit | Attending: Interventional Cardiology | Admitting: Interventional Cardiology

## 2020-01-13 ENCOUNTER — Encounter (HOSPITAL_COMMUNITY): Payer: Medicare HMO

## 2020-01-13 DIAGNOSIS — Z955 Presence of coronary angioplasty implant and graft: Secondary | ICD-10-CM

## 2020-01-13 DIAGNOSIS — I214 Non-ST elevation (NSTEMI) myocardial infarction: Secondary | ICD-10-CM | POA: Insufficient documentation

## 2020-01-13 NOTE — Progress Notes (Signed)
Reviewed home exercise guidelines with patient including endpoints, temperature precautions, target heart rate and rate of perceived exertion. Patient plans to walk at local gym as his mode of home exercise. Patient voices understanding of instructions given.  Sol Passer, MS, ACSM CEP

## 2020-01-14 ENCOUNTER — Ambulatory Visit (HOSPITAL_COMMUNITY): Payer: Medicare HMO

## 2020-01-14 ENCOUNTER — Telehealth: Payer: Self-pay | Admitting: Cardiology

## 2020-01-14 LAB — NOVEL CORONAVIRUS, NAA: SARS-CoV-2, NAA: NOT DETECTED

## 2020-01-14 IMAGING — CR RIGHT ANKLE - COMPLETE 3+ VIEW
3 series · 3 of 3 positions shown · non-contrast
Comparison: None.

CLINICAL DATA: Initial evaluation for acute pain and swelling.

EXAM:
RIGHT ANKLE - COMPLETE 3+ VIEW

[ankle ap]
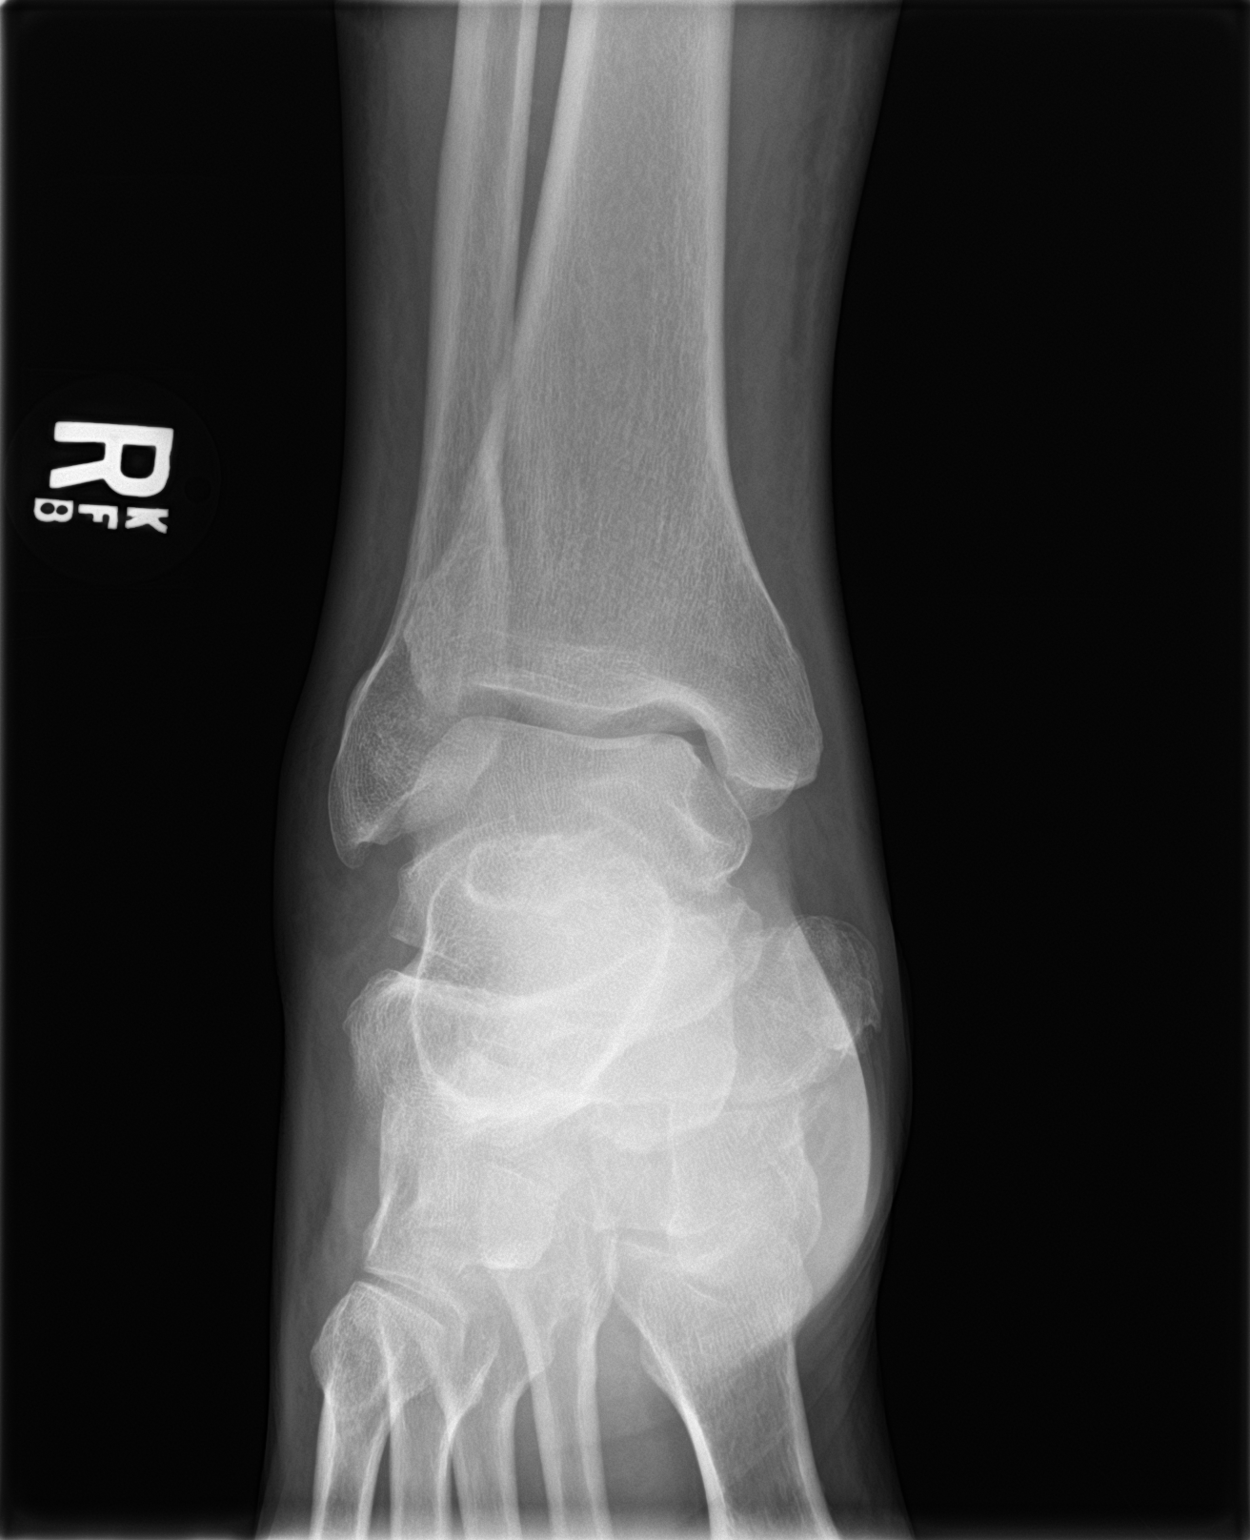

[ankle obl]
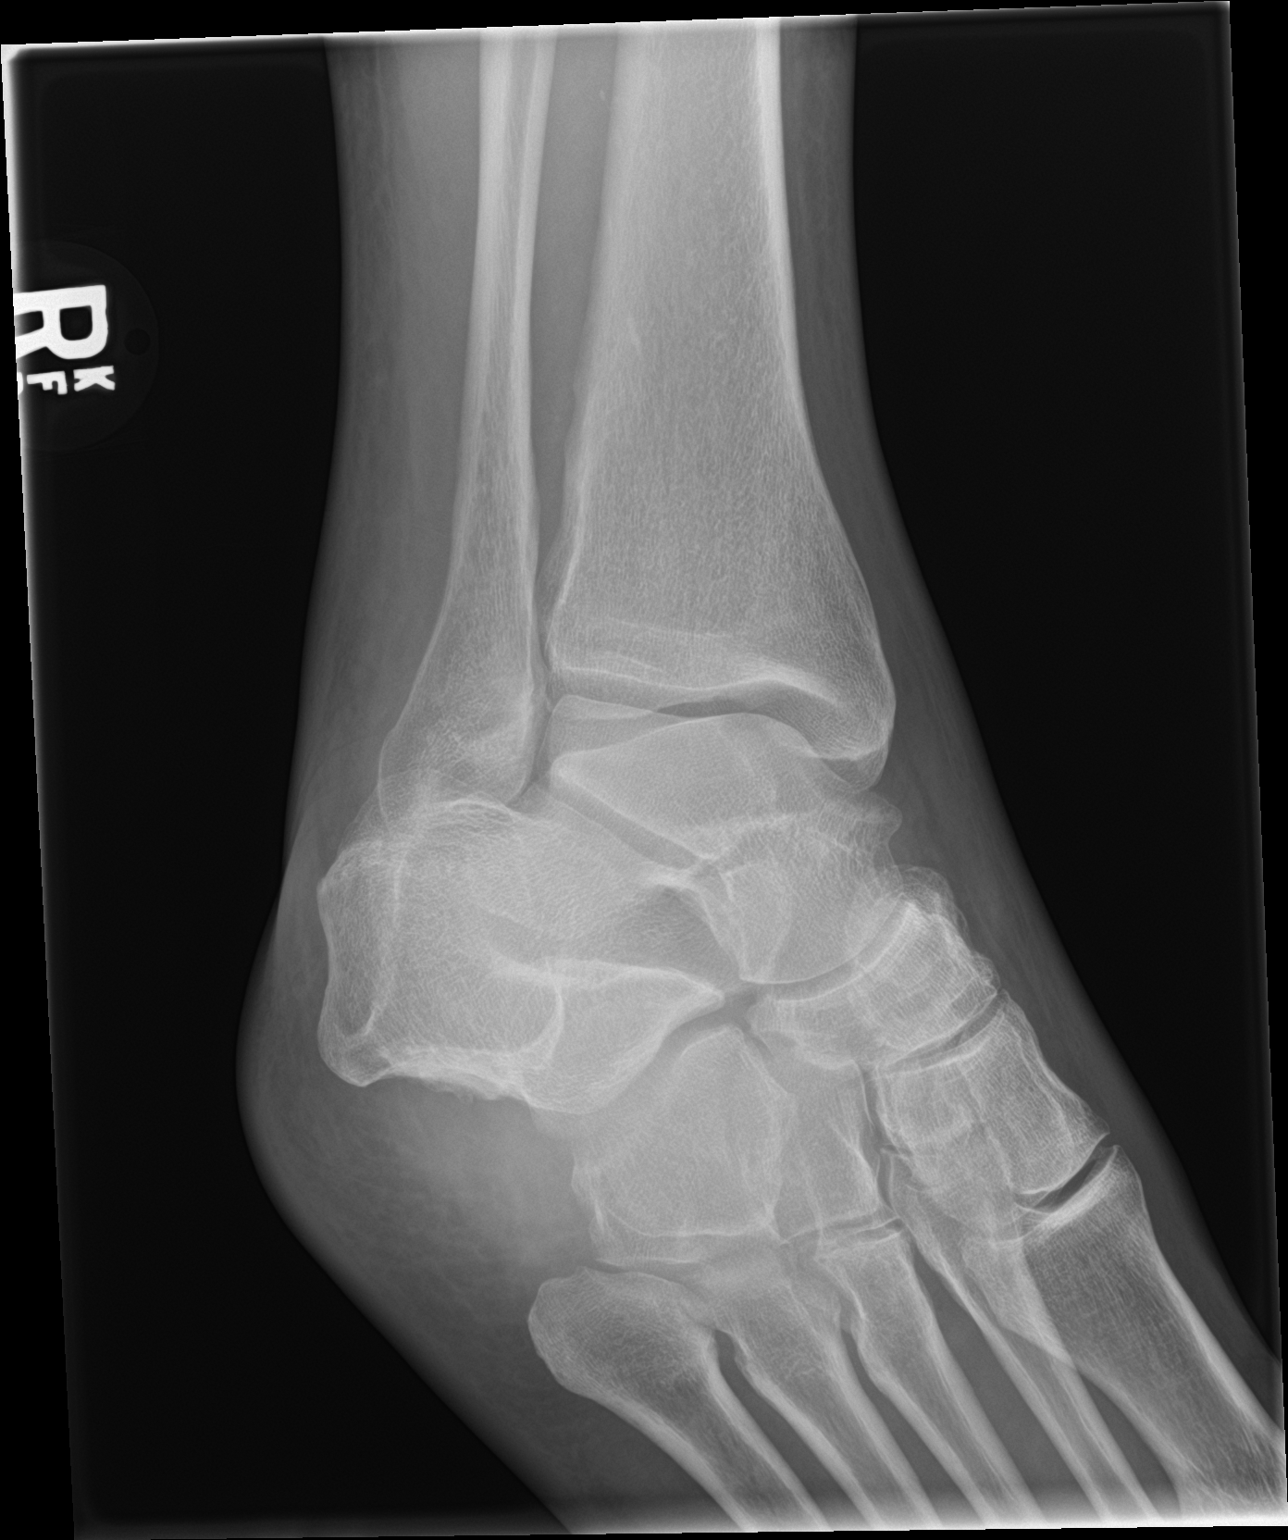

[ankle lat]
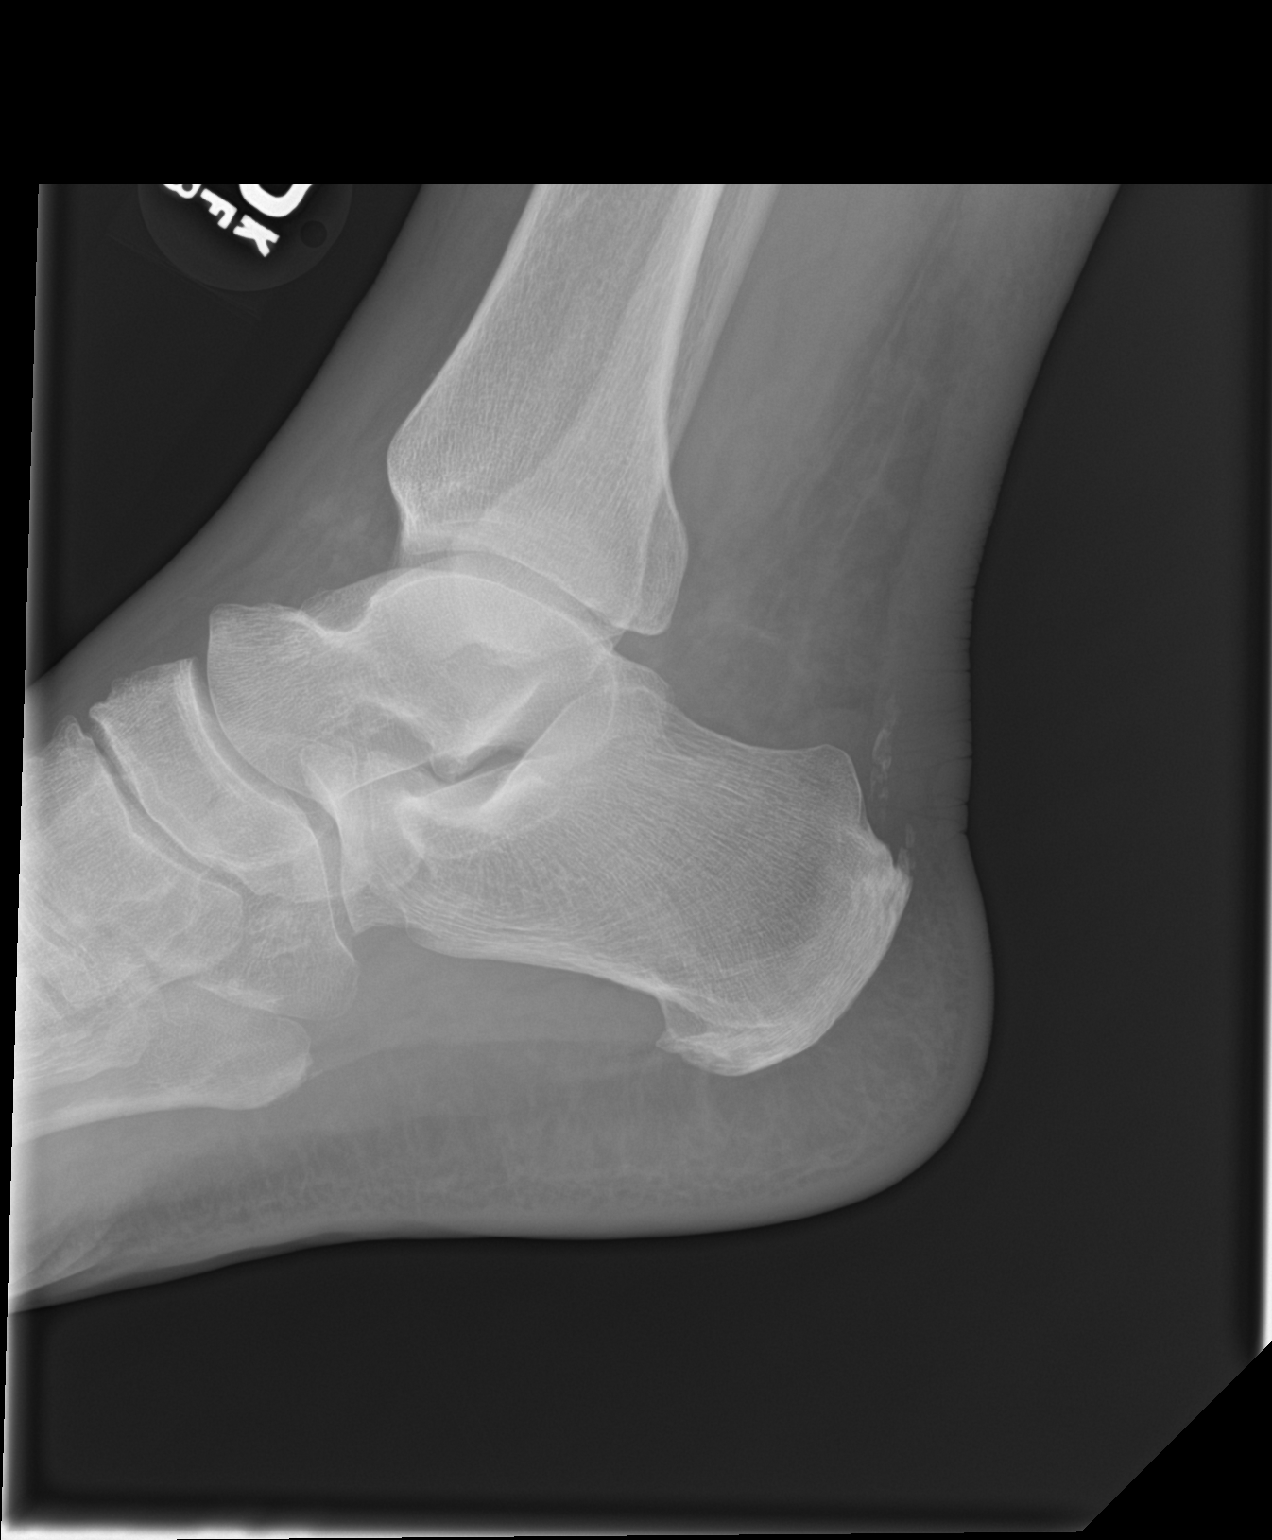

[3 of 3 positions shown; findings below may reference images not displayed]

FINDINGS: No acute fracture or dislocation. Ankle mortise approximated. Talar
dome intact. Degenerative spurring noted at the dorsal midfoot.
Posterior plantar calcaneal enthesophytes noted. Mild diffuse soft
tissue swelling about the ankle.
IMPRESSION: 1. No acute osseous abnormality.
2. Mild diffuse soft tissue swelling about the ankle.

## 2020-01-14 NOTE — Telephone Encounter (Signed)
Spoke to patient he stated he started cardiac rehab back yesterday.Stated he has had pain in right rib area since.Stated he took 2 Tylenol yesterday with relief.Stated right rib area sore again this morning.Stated he did a lot of stretching, lifted a few weights,walked on treadmill..He has not been use to exercising.Advised sounds like pain related to muscles.Advised he can take 2 more Tylenol.Advised make cardiac rehab RN's aware tomorrow.Advised if pain not relieved by Tylenol to call back.I will send message to [place] for review.

## 2020-01-14 NOTE — Telephone Encounter (Signed)
Patient called and said that he is experiencing tightness in the middle of his back near the right side. Michela Pitcher he is not sure if its related to him exercising yesterday or not.

## 2020-01-15 ENCOUNTER — Encounter (HOSPITAL_COMMUNITY)
Admission: RE | Admit: 2020-01-15 | Discharge: 2020-01-15 | Disposition: A | Discharge status: Home / Self Care | Payer: Medicare HMO | Source: Ambulatory Visit | Attending: Interventional Cardiology | Admitting: Interventional Cardiology

## 2020-01-15 ENCOUNTER — Encounter (HOSPITAL_COMMUNITY): Payer: Medicare HMO

## 2020-01-15 ENCOUNTER — Other Ambulatory Visit: Payer: Self-pay

## 2020-01-15 VITALS — Wt 189.0 lb

## 2020-01-15 DIAGNOSIS — Z955 Presence of coronary angioplasty implant and graft: Secondary | ICD-10-CM | POA: Diagnosis not present

## 2020-01-15 DIAGNOSIS — I214 Non-ST elevation (NSTEMI) myocardial infarction: Secondary | ICD-10-CM | POA: Diagnosis not present

## 2020-01-15 NOTE — Progress Notes (Addendum)
Dylan Lane 70 y.o. male Nutrition Note Visit Diagnosis: 10/21/19 DES distal Cfx  Past Medical History:  Diagnosis Date  . CAD (coronary artery disease), native coronary artery    a. cath 10/21/19 s/p DES to Lcx, medical therapy for 50% LAD stenosis.  . Colon polyps    3/06 and 2009:needs repeat in 3 yrs(3/12)  . GERD (gastroesophageal reflux disease) 2017  . Hyperlipidemia   . Hypertension   . Pre-diabetes    with fasting glucose of 110(2/09)  . Rectal bleeding    History of     Medications reviewed.   Current Outpatient Medications:  .  acetaminophen (TYLENOL) 325 MG tablet, Take 650 mg by mouth every 6 (six) hours as needed for mild pain or headache., Disp: , Rfl:  .  amLODipine (NORVASC) 10 MG tablet, Take 1 tablet by mouth once daily, Disp: 90 tablet, Rfl: 0 .  aspirin 81 MG EC tablet, Take 1 tablet (81 mg total) by mouth daily., Disp: 30 tablet, Rfl: 11 .  atorvastatin (LIPITOR) 80 MG tablet, Take 1 tablet (80 mg total) by mouth daily., Disp: 90 tablet, Rfl: 3 .  clopidogrel (PLAVIX) 75 MG tablet, Take 1 tablet (75 mg total) by mouth daily., Disp: 94 tablet, Rfl: 4 .  metoprolol tartrate (LOPRESSOR) 25 MG tablet, Take 25 mg by mouth daily., Disp: , Rfl:  .  nitroGLYCERIN (NITROSTAT) 0.4 MG SL tablet, Place 1 tablet (0.4 mg total) under the tongue every 5 (five) minutes x 3 doses as needed for chest pain., Disp: 25 tablet, Rfl: 0 .  pantoprazole (PROTONIX) 40 MG tablet, Take 1 tablet (40 mg total) by mouth daily., Disp: 30 tablet, Rfl: 0   Ht Readings from Last 1 Encounters:  12/24/19 5\' 7"  (1.702 m)     Wt Readings from Last 3 Encounters:  12/24/19 188 lb 12.8 oz (85.6 kg)  12/11/19 189 lb (85.7 kg)  12/10/19 188 lb 11.4 oz (85.6 kg)     There is no height or weight on file to calculate BMI.   Social History   Tobacco Use  Smoking Status Former Smoker  . Types: Cigarettes  . Quit date: 03/14/2000  . Years since quitting: 19.8  Smokeless Tobacco Never  Used     Lab Results  Component Value Date   CHOL 168 07/16/2019   Lab Results  Component Value Date   HDL 44 07/16/2019   Lab Results  Component Value Date   LDLCALC 108 (H) 07/16/2019   Lab Results  Component Value Date   TRIG 69 07/16/2019     Lab Results  Component Value Date   HGBA1C 5.8 (H) 07/16/2019     CBG (last 3)  No results for input(s): GLUCAP in the last 72 hours.   Nutrition Note  Spoke with pt. Nutrition Plan and Nutrition Survey goals reviewed with pt.  Pt has already started making heart healthy changes to his diet.  Pt has Pre-diabetes. Last A1c indicates blood glucose well-controlled.  He wants to lose weight and reduce A1C and LDL.   Normal appetite.Eats 2 meals per day. Avoids sodas.  Diet recall: Breakfast: frosted flakes or something light with medicines, coffee with splash french vanilla creamer or hot tea  Dinner: steak, potato salad, and green beans (normally he tries to eat a balanced meal) Snacks: occasionally cookie or candy  Cooking fat: olive oil Spread: butter with olive oil Red meat <2/week Limited fried foods Eats out <twice per week Enjoys vegetables.  Eats bananas  and peaches in heavy syrup. Often chooses white bread. He really enjoys "dinner foods" because they hold him over/he does not need snacks.   Per discussion, pt does use canned/convenience foods often. Pt does not add salt to food. Pt does not eat out frequently.  Plans to keep 1 week food log.  Pt expressed understanding of the information reviewed.    Nutrition Diagnosis ? Overweight  related to excessive energy intake and decreased physical activity/exercise since NSTEMI as evidenced by a BMI 29.6 kg/m2 ? Inadequate fiber intake related to lack of previous education about dietary fiber as evidenced by estimated daily fiber intake of 11 g.  Nutrition Intervention ? Pt's individual nutrition plan reviewed with pt. ? Benefits of adopting Heart Healthy diet  discussed when Picture My Plate reviewed. ? Continue client-centered nutrition education by RD, as part of interdisciplinary care.  Goal(s) ? Pt to identify food quantities necessary to achieve weight loss of 6-24 lb at graduation from cardiac rehab.  ? Pt to build a healthy plate including vegetables, fruits, whole grains, and low-fat dairy products in a heart healthy meal plan. ? Replace refined carbohydrates with whole grains for 3 servings per day  Plan:   Will provide client-centered nutrition education as part of interdisciplinary care  Monitor and evaluate progress toward nutrition goal with team.   Michaele Offer, MS, RDN, LDN

## 2020-01-16 NOTE — Telephone Encounter (Signed)
Agree with recommendation

## 2020-01-20 ENCOUNTER — Ambulatory Visit (HOSPITAL_COMMUNITY): Payer: Medicare HMO

## 2020-01-20 ENCOUNTER — Encounter (HOSPITAL_COMMUNITY): Payer: Medicare HMO

## 2020-01-21 ENCOUNTER — Ambulatory Visit (HOSPITAL_COMMUNITY): Payer: Medicare HMO

## 2020-01-22 ENCOUNTER — Other Ambulatory Visit: Payer: Self-pay

## 2020-01-22 ENCOUNTER — Encounter (HOSPITAL_COMMUNITY)
Admission: RE | Admit: 2020-01-22 | Discharge: 2020-01-22 | Disposition: A | Discharge status: Home / Self Care | Payer: Medicare HMO | Source: Ambulatory Visit | Attending: Interventional Cardiology | Admitting: Interventional Cardiology

## 2020-01-22 ENCOUNTER — Encounter (HOSPITAL_COMMUNITY): Payer: Medicare HMO

## 2020-01-22 DIAGNOSIS — I214 Non-ST elevation (NSTEMI) myocardial infarction: Secondary | ICD-10-CM | POA: Diagnosis not present

## 2020-01-22 DIAGNOSIS — Z955 Presence of coronary angioplasty implant and graft: Secondary | ICD-10-CM

## 2020-01-23 ENCOUNTER — Other Ambulatory Visit: Payer: Self-pay

## 2020-01-23 ENCOUNTER — Ambulatory Visit (AMBULATORY_SURGERY_CENTER): Payer: Self-pay

## 2020-01-23 VITALS — Ht 67.0 in | Wt 190.0 lb

## 2020-01-23 DIAGNOSIS — K219 Gastro-esophageal reflux disease without esophagitis: Secondary | ICD-10-CM

## 2020-01-23 NOTE — Progress Notes (Signed)
No allergies to soy or egg Pt is not on diet pills Denies issues with sedation/intubation Denies atrial flutter/fib Denies constipation   Emmi instructions given to pt  Pt is aware of Covid safety and care partner requirements.   Pt is on blood thinner but per TE of 12/31/19 is to continue without interruption for EGD.

## 2020-01-27 ENCOUNTER — Telehealth (HOSPITAL_COMMUNITY): Payer: Self-pay | Admitting: *Deleted

## 2020-01-27 ENCOUNTER — Encounter (HOSPITAL_COMMUNITY): Payer: Medicare HMO

## 2020-01-27 ENCOUNTER — Ambulatory Visit (HOSPITAL_COMMUNITY): Payer: Medicare HMO

## 2020-01-28 ENCOUNTER — Ambulatory Visit (HOSPITAL_COMMUNITY): Payer: Medicare HMO

## 2020-01-29 ENCOUNTER — Encounter (HOSPITAL_COMMUNITY): Payer: Medicare HMO

## 2020-01-29 ENCOUNTER — Other Ambulatory Visit: Payer: Self-pay

## 2020-01-29 ENCOUNTER — Encounter: Payer: Self-pay | Admitting: Internal Medicine

## 2020-01-29 ENCOUNTER — Ambulatory Visit (AMBULATORY_SURGERY_CENTER): Payer: Medicare HMO | Admitting: Internal Medicine

## 2020-01-29 VITALS — BP 105/70 | HR 67 | Temp 97.5°F | Resp 14 | Ht 67.0 in | Wt 190.0 lb

## 2020-01-29 DIAGNOSIS — K319 Disease of stomach and duodenum, unspecified: Secondary | ICD-10-CM

## 2020-01-29 DIAGNOSIS — E785 Hyperlipidemia, unspecified: Secondary | ICD-10-CM | POA: Diagnosis not present

## 2020-01-29 DIAGNOSIS — I2510 Atherosclerotic heart disease of native coronary artery without angina pectoris: Secondary | ICD-10-CM | POA: Diagnosis not present

## 2020-01-29 DIAGNOSIS — K2970 Gastritis, unspecified, without bleeding: Secondary | ICD-10-CM

## 2020-01-29 DIAGNOSIS — K219 Gastro-esophageal reflux disease without esophagitis: Secondary | ICD-10-CM

## 2020-01-29 DIAGNOSIS — R1310 Dysphagia, unspecified: Secondary | ICD-10-CM

## 2020-01-29 DIAGNOSIS — K2100 Gastro-esophageal reflux disease with esophagitis, without bleeding: Secondary | ICD-10-CM

## 2020-01-29 DIAGNOSIS — K3189 Other diseases of stomach and duodenum: Secondary | ICD-10-CM | POA: Diagnosis not present

## 2020-01-29 DIAGNOSIS — R933 Abnormal findings on diagnostic imaging of other parts of digestive tract: Secondary | ICD-10-CM

## 2020-01-29 DIAGNOSIS — I10 Essential (primary) hypertension: Secondary | ICD-10-CM | POA: Diagnosis not present

## 2020-01-29 DIAGNOSIS — E669 Obesity, unspecified: Secondary | ICD-10-CM | POA: Diagnosis not present

## 2020-01-29 MED ORDER — SODIUM CHLORIDE 0.9 % IV SOLN: 500.0000 mL | INTRAVENOUS | Status: DC

## 2020-01-29 MED ORDER — PANTOPRAZOLE SODIUM 40 MG PO TBEC: 40.0000 mg | DELAYED_RELEASE_TABLET | Freq: Every day | ORAL | 3 refills | Status: DC

## 2020-01-29 NOTE — Op Note (Signed)
[facility] Patient Name: Dylan Lane Procedure Date: 01/29/2020 10:10 AM MRN: [id number] Endoscopist: Jerene Bears , MD Age: 70 Referring MD:  Date of Birth: 17-Oct-1950 Gender: Male Account #: [id number] Procedure:                Upper GI endoscopy Indications:              Dysphagia, Abnormal cine-esophagram, pantoprazole                            prescribed but patient not currently using daily Medicines:                Monitored Anesthesia Care Procedure:                Pre-Anesthesia Assessment:                           - Prior to the procedure, a History and Physical                            was performed, and patient medications and                            allergies were reviewed. The patient's tolerance of                            previous anesthesia was also reviewed. The risks                            and benefits of the procedure and the sedation                            options and risks were discussed with the patient.                            All questions were answered, and informed consent                            was obtained. Prior Anticoagulants: The patient has                            taken Plavix (clopidogrel), last dose was day of                            procedure. ASA Grade Assessment: III - A patient                            with severe systemic disease. After reviewing the                            risks and benefits, the patient was deemed in                            satisfactory condition to undergo the procedure.  After obtaining informed consent, the endoscope was                            passed under direct vision. Throughout the                            procedure, the patient's blood pressure, pulse, and                            oxygen saturations were monitored continuously. The                            Endoscope was introduced through the mouth, and                             advanced to the second part of duodenum. The upper                            GI endoscopy was accomplished without difficulty.                            The patient tolerated the procedure well. Scope In: Scope Out: Findings:                 LA Grade A (one or more mucosal breaks less than 5                            mm, not extending between tops of 2 mucosal folds)                            esophagitis with no bleeding was found at the                            gastroesophageal junction. No stricture or ring was                            seen.                           The exam of the esophagus was otherwise normal.                           The gastroesophageal flap valve was visualized                            endoscopically and classified as Hill Grade II                            (fold present, opens with respiration).                           Mild inflammation characterized by erythema and  granularity was found in the gastric antrum and in                            the prepyloric region of the stomach. Biopsies were                            taken with a cold forceps for histology and                            Helicobacter pylori testing.                           A single umbilicated lesion measuring small in                            diameter was found in the gastric antrum. This is                            most consistent with a pancreatic rest.                           The examined duodenum was normal. Complications:            No immediate complications. Estimated Blood Loss:     Estimated blood loss was minimal. Impression:               - LA Grade A reflux esophagitis with no bleeding.                           - Gastritis. Biopsied.                           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                           - Normal examined  duodenum. Recommendation:           - Patient has a contact number available for                            emergencies. The signs and symptoms of potential                            delayed complications were discussed with the                            patient. Return to normal activities tomorrow.                            Written discharge instructions were provided to the                            patient.                           -  Resume previous diet.                           - Continue present medications. Please begin using                            the pantoprazole 40 mg every day. This medication                            is best taken 30 min before your 1st meal of the                            day.                           - Await pathology results.                           - If persistent swallowing difficulty or acid                            reflux symptoms (heartburn, globus sensation)                            please call my office for follow-up. Jerene Bears, MD 01/29/2020 10:34:29 AM This report has been signed electronically.

## 2020-01-29 NOTE — Progress Notes (Signed)
Patient developed abd post procedure.  His belly was tight. Patient passed gas and states that he "feels much better.'  Taken to car by Clement Husbands, RN

## 2020-01-29 NOTE — Progress Notes (Signed)
Called to room to assist during endoscopic procedure.  Patient ID and intended procedure confirmed with present staff. Received instructions for my participation in the procedure from the performing physician.  

## 2020-01-29 NOTE — Patient Instructions (Signed)
Please take your medicine 1/2 hour before breakfast every day.  It w ill help the redness in your stomach.  Read all of the handouts given oyou by your recovery room nurse.  YOU HAD AN ENDOSCOPIC PROCEDURE TODAY AT [facility]:   Refer to the procedure report that was given to you for any specific questions about what was found during the examination.  If the procedure report does not answer your questions, please call your gastroenterologist to clarify.  If you requested that your care partner not be given the details of your procedure findings, then the procedure report has been included in a sealed envelope for you to review at your convenience later.  YOU SHOULD EXPECT: Some feelings of bloating in the abdomen. Passage of more gas than usual.  Walking can help get rid of the air that was put into your GI tract during the procedure and reduce the bloating.   Please Note:  You might notice some irritation and congestion in your nose or some drainage.  This is from the oxygen used during your procedure.  There is no need for concern and it should clear up in a day or so.  SYMPTOMS TO REPORT IMMEDIATELY:    Following upper endoscopy (EGD)  Vomiting of blood or coffee ground material  New chest pain or pain under the shoulder blades  Painful or persistently difficult swallowing  New shortness of breath  Fever of 100F or higher  Black, tarry-looking stools  For urgent or emergent issues, a gastroenterologist can be reached at any hour by calling [phone]. Do not use MyChart messaging for urgent concerns.    DIET:  We do recommend a small meal at first, but then you may proceed to your regular diet.  Drink plenty of fluids but you should avoid alcoholic beverages for 24 hours.  ACTIVITY:  You should plan to take it easy for the rest of today and you should NOT DRIVE or use heavy machinery until tomorrow (because of the sedation medicines used during the test).     FOLLOW UP: Our staff will call the number listed on your records 48-72 hours following your procedure to check on you and address any questions or concerns that you may have regarding the information given to you following your procedure. If we do not reach you, we will leave a message.  We will attempt to reach you two times.  During this call, we will ask if you have developed any symptoms of COVID 19. If you develop any symptoms (ie: fever, flu-like symptoms, shortness of breath, cough etc.) before then, please call [phone].  If you test positive for Covid 19 in the 2 weeks post procedure, please call and report this information to [place].    If any biopsies were taken you will be contacted by phone or by letter within the next 1-3 weeks.  Please call us at [phone] if you have not heard about the biopsies in 3 weeks.    SIGNATURES/CONFIDENTIALITY: You and/or your care partner have signed paperwork which will be entered into your electronic medical record.  These signatures attest to the fact that that the information above on your After Visit Summary has been reviewed and is understood.  Full responsibility of the confidentiality of this discharge information lies with you and/or your care-partner.

## 2020-01-29 NOTE — Progress Notes (Signed)
Report to PACU, RN, vss, BBS= Clear.  

## 2020-01-31 ENCOUNTER — Telehealth: Payer: Self-pay

## 2020-01-31 NOTE — Telephone Encounter (Signed)
Covid-19 screening questions   Do you now or have you had a fever in the last 14 days? No.  Do you have any respiratory symptoms of shortness of breath or cough now or in the last 14 days? No.  Do you have any family members or close contacts with diagnosed or suspected Covid-19 in the past 14 days? No.  Have you been tested for Covid-19 and found to be positive? No.       Follow up Call-  Call back number 01/29/2020  Post procedure Call Back phone  # [phone]  Permission to leave phone message Yes  Some recent data might be hidden     Patient questions:  Do you have a fever, pain , or abdominal swelling? No. Pain Score  0 *  Have you tolerated food without any problems? Yes.    Have you been able to return to your normal activities? Yes.    Do you have any questions about your discharge instructions: Diet   No. Medications  No. Follow up visit  No.  Do you have questions or concerns about your Care? No.  Actions: * If pain score is 4 or above: No action needed, pain <4.

## 2020-02-03 ENCOUNTER — Encounter (HOSPITAL_COMMUNITY): Payer: Medicare HMO

## 2020-02-03 ENCOUNTER — Ambulatory Visit (HOSPITAL_COMMUNITY): Payer: Medicare HMO

## 2020-02-03 ENCOUNTER — Telehealth: Payer: Self-pay | Admitting: Internal Medicine

## 2020-02-03 ENCOUNTER — Telehealth (HOSPITAL_COMMUNITY): Payer: Self-pay | Admitting: *Deleted

## 2020-02-03 NOTE — Telephone Encounter (Signed)
Patient called out this morning due to other medical appointment and work. Patient request to extend graduation date from today 02/03/20 to next Wednesday 02/12/20. Will extend patient's cardiac rehab program until Wednesday, 02/12/20.  Sol Passer, [place], ACSM Juanito Doom 02/03/2020 [phone]

## 2020-02-03 NOTE — Telephone Encounter (Signed)
Spoke with pt and let him know the results are not back yet. He will be notified once they are back and have been reviewed by the doctor.

## 2020-02-03 NOTE — Telephone Encounter (Signed)
Patient called requesting to speak with a nurse and go over the procedure that he had on 01/29/20 and results

## 2020-02-04 ENCOUNTER — Ambulatory Visit (HOSPITAL_COMMUNITY): Payer: Medicare HMO

## 2020-02-05 ENCOUNTER — Encounter (HOSPITAL_COMMUNITY): Payer: Medicare HMO

## 2020-02-06 ENCOUNTER — Encounter: Payer: Self-pay | Admitting: Internal Medicine

## 2020-02-10 ENCOUNTER — Telehealth: Payer: Self-pay | Admitting: Adult Health

## 2020-02-10 ENCOUNTER — Encounter (HOSPITAL_COMMUNITY)
Admission: RE | Admit: 2020-02-10 | Discharge: 2020-02-10 | Disposition: A | Discharge status: Home / Self Care | Payer: Medicare HMO | Source: Ambulatory Visit | Attending: Interventional Cardiology | Admitting: Interventional Cardiology

## 2020-02-10 ENCOUNTER — Other Ambulatory Visit: Payer: Self-pay

## 2020-02-10 VITALS — BP 124/74 | HR 81 | Wt 187.8 lb

## 2020-02-10 DIAGNOSIS — Z955 Presence of coronary angioplasty implant and graft: Secondary | ICD-10-CM | POA: Diagnosis not present

## 2020-02-10 DIAGNOSIS — I214 Non-ST elevation (NSTEMI) myocardial infarction: Secondary | ICD-10-CM | POA: Insufficient documentation

## 2020-02-10 NOTE — Telephone Encounter (Signed)
The patient wants to go to a [employer]/gallery [employer]. He was referred to [facility] from his [employer].  He would like a referral to  [facility] [phone]  He went to Cardiac rehab today for his prostate.  I kept trying to get clarification from the patient but he stated that they gave him the address and number to give [name] for his referral.  Please advise

## 2020-02-10 NOTE — Progress Notes (Signed)
Cardiac Individual Treatment Plan  Patient Details  Name: Dylan Lane MRN: [id number] Date of Birth: December 16, 1950 Referring Provider:   Flowsheet Row CARDIAC REHAB PHASE II ORIENTATION from 12/10/2019 in [place]  Referring Provider Belva Crome, MD      Initial Encounter Date:  Dylan Lane PHASE II ORIENTATION from 12/10/2019 in [place]  Date 12/10/19      Visit Diagnosis: 10/21/19 NSTEMI  10/21/19 DES distal Cfx  Patient's Home Medications on Admission:  Current Outpatient Medications:  .  acetaminophen (TYLENOL) 325 MG tablet, Take 650 mg by mouth every 6 (six) hours as needed for mild pain or headache., Disp: , Rfl:  .  amLODipine (NORVASC) 10 MG tablet, Take 1 tablet by mouth once daily, Disp: 90 tablet, Rfl: 0 .  aspirin 81 MG EC tablet, Take 1 tablet (81 mg total) by mouth daily., Disp: 30 tablet, Rfl: 11 .  atorvastatin (LIPITOR) 80 MG tablet, Take 1 tablet (80 mg total) by mouth daily., Disp: 90 tablet, Rfl: 3 .  clopidogrel (PLAVIX) 75 MG tablet, Take 1 tablet (75 mg total) by mouth daily., Disp: 94 tablet, Rfl: 4 .  metoprolol tartrate (LOPRESSOR) 25 MG tablet, Take 25 mg by mouth daily., Disp: , Rfl:  .  nitroGLYCERIN (NITROSTAT) 0.4 MG SL tablet, Place 1 tablet (0.4 mg total) under the tongue every 5 (five) minutes x 3 doses as needed for chest pain. (Patient not taking: Reported on 01/29/2020), Disp: 25 tablet, Rfl: 0 .  pantoprazole (PROTONIX) 40 MG tablet, Take 1 tablet (40 mg total) by mouth daily., Disp: 90 tablet, Rfl: 3  Past Medical History: Past Medical History:  Diagnosis Date  . Arthritis   . CAD (coronary artery disease), native coronary artery    a. cath 10/21/19 s/p DES to Lcx, medical therapy for 50% LAD stenosis.  . Colon polyps    3/06 and 2009:needs repeat in 3 yrs(3/12)  . GERD (gastroesophageal reflux disease) 2017  . Hyperlipidemia   . Hypertension    . Myocardial infarction ([place])   . Pre-diabetes    with fasting glucose of 110(2/09)  . Rectal bleeding    History of  . Scoliosis     Tobacco Use: Social History   Tobacco Use  Smoking Status Former Smoker  . Types: Cigarettes  . Quit date: 03/14/2000  . Years since quitting: 19.9  Smokeless Tobacco Never Used    Labs: Recent Chemical engineer    Labs for ITP Cardiac and Pulmonary Rehab Latest Ref Rng & Units 06/05/2013 01/22/2015 07/16/2018 03/19/2019 07/16/2019   Cholestrol <200 mg/dL - 174 - - 168   LDLCALC mg/dL (calc) - 114(H) - - 108(H)   HDL > OR = 40 mg/dL - 42 - - 44   Trlycerides <150 mg/dL - 92 - - 69   Hemoglobin A1c <5.7 % of total Hgb 5.8(H) 6.1(H) 6.4(A) 5.6 5.8(H)      Capillary Blood Glucose: Lab Results  Component Value Date   GLUCAP 103 (H) 07/16/2018   GLUCAP 95 12/22/2014   GLUCAP 87 02/05/2014   GLUCAP 101 (H) 06/06/2008     Exercise Target Goals: Exercise Program Goal: Individual exercise prescription set using results from initial 6 min walk test and THRR while considering  patient's activity barriers and safety.   Exercise Prescription Goal: Initial exercise prescription builds to 30-45 minutes a day of aerobic activity, 2-3 days per week.  Home exercise guidelines will  be given to patient during program as part of exercise prescription that the participant will acknowledge.  Activity Barriers & Risk Stratification:  Activity Barriers & Cardiac Risk Stratification - 12/10/19 0803      Activity Barriers & Cardiac Risk Stratification   Activity Barriers None    Cardiac Risk Stratification High           6 Minute Walk:  6 Minute Walk    Row Name 12/10/19 0814         6 Minute Walk   Phase Initial     Distance 1641 feet     Walk Time 6 minutes     # of Rest Breaks 0     MPH 3.11     METS 3.29     RPE 13     Perceived Dyspnea  0     VO2 Peak 11.51     Symptoms No     Resting HR 76 bpm     Resting BP 108/70     Resting  Oxygen Saturation  98 %     Exercise Oxygen Saturation  during 6 min walk 97 %     Max Ex. HR 85 bpm     Max Ex. BP 112/78     2 Minute Post BP 98/66            Oxygen Initial Assessment:   Oxygen Re-Evaluation:   Oxygen Discharge (Final Oxygen Re-Evaluation):   Initial Exercise Prescription:  Initial Exercise Prescription - 12/10/19 0900      Date of Initial Exercise RX and Referring Provider   Date 12/10/19    Referring Provider Belva Crome, MD    Expected Discharge Date 01/01/20      Treadmill   MPH 3    Grade 0    Minutes 15    METs 3.3      NuStep   Level 3    SPM 85    Minutes 15    METs 2.5      Prescription Details   Frequency (times per week) 2    Duration Progress to 30 minutes of continuous aerobic without signs/symptoms of physical distress      Intensity   THRR 40-80% of Max Heartrate 62-123    Ratings of Perceived Exertion 11-13    Perceived Dyspnea 0-4      Progression   Progression Continue to progress workloads to maintain intensity without signs/symptoms of physical distress.      Resistance Training   Training Prescription Yes    Weight 4 lbs    Reps 10-15           Perform Capillary Blood Glucose checks as needed.  Exercise Prescription Changes:   Exercise Prescription Changes    Row Name 12/16/19 0724 01/13/20 0704 02/10/20 0702         Response to Exercise   Blood Pressure (Admit) 98/64 98/70 124/74     Blood Pressure (Exercise) 130/80 126/84 144/80     Blood Pressure (Exit) 110/74 112/82 100/72     Heart Rate (Admit) 71 bpm 61 bpm 81 bpm     Heart Rate (Exercise) 109 bpm 94 bpm 104 bpm     Heart Rate (Exit) 71 bpm 69 bpm 76 bpm     Rating of Perceived Exertion (Exercise) 9 13 13      Symptoms none none none     Comments Off to a good start with exercise. -- --     Duration Progress  to 30 minutes of  aerobic without signs/symptoms of physical distress Progress to 30 minutes of  aerobic without signs/symptoms of  physical distress Progress to 30 minutes of  aerobic without signs/symptoms of physical distress     Intensity THRR unchanged THRR unchanged THRR unchanged           Progression   Progression Continue to progress workloads to maintain intensity without signs/symptoms of physical distress. Continue to progress workloads to maintain intensity without signs/symptoms of physical distress. Continue to progress workloads to maintain intensity without signs/symptoms of physical distress.     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 Add 4 additional days to program exercise sessions.     Initial Home Exercises Provided -- 01/13/20 01/13/20            Exercise Comments:   Exercise Comments    Row Name 12/16/19 0806 01/13/20 0734 02/10/20 0731       Exercise Comments Patient tolerated 1st exercise session well without symptoms. Reviewed home exercise guidelines and goals with patient. Reviewed home exercise prescription, METs, and goals with patient.            Exercise Goals and Review:   Exercise Goals    Row Name 12/10/19 0804             Exercise Goals   Increase Physical Activity Yes       Intervention Provide advice, education, support and counseling about physical activity/exercise needs.;Develop an individualized exercise prescription for aerobic and resistive training based on initial  evaluation findings, risk stratification, comorbidities and participant's personal goals.       Expected Outcomes Short Term: Attend rehab on a regular basis to increase amount of physical activity.;Long Term: Add in home exercise to make exercise part of routine and to increase amount of physical activity.;Long Term: Exercising regularly at least 3-5 days a week.       Increase Strength and Stamina Yes       Intervention Provide advice, education, support and counseling about physical activity/exercise needs.;Develop an individualized exercise prescription for aerobic and resistive training based on initial evaluation findings, risk stratification, comorbidities and participant's personal goals.       Expected Outcomes Short Term: Increase workloads from initial exercise prescription for resistance, speed, and METs.;Short Term: Perform resistance training exercises routinely during rehab and add in resistance training at home;Long Term: Improve cardiorespiratory fitness,  muscular endurance and strength as measured by increased METs and functional capacity (6MWT)       Able to understand and use rate of perceived exertion (RPE) scale Yes       Intervention Provide education and explanation on how to use RPE scale       Expected Outcomes Short Term: Able to use RPE daily in rehab to express subjective intensity level;Long Term:  Able to use RPE to guide intensity level when exercising independently       Knowledge and understanding of Target Heart Rate Range (THRR) Yes       Intervention Provide education and explanation of THRR including how the numbers were predicted and where they are located for reference       Expected Outcomes Short Term: Able to state/look up THRR;Long Term: Able to use THRR to govern intensity when exercising independently;Short Term: Able to use daily as guideline for intensity in rehab       Able to check pulse independently Yes       Intervention Provide education and  demonstration on how to check pulse in carotid and radial arteries.;Review the importance of being able to check your own pulse for safety during independent exercise       Expected Outcomes Short Term: Able to explain why pulse checking is important during independent exercise;Long Term: Able to check pulse independently and accurately       Understanding of Exercise Prescription Yes       Intervention Provide education, explanation, and written materials on patient's individual exercise prescription       Expected Outcomes Short Term: Able to explain program exercise prescription;Long Term: Able to explain home exercise prescription to exercise independently              Exercise Goals Re-Evaluation :  Exercise Goals Re-Evaluation    Row Name 12/16/19 0806 01/13/20 0734 02/10/20 0731         Exercise Goal Re-Evaluation   Exercise Goals Review Able to understand and use rate of perceived exertion (RPE) scale;Increase Physical Activity Able to understand and use rate of perceived exertion (RPE) scale;Increase Physical Activity;Increase Strength and Stamina;Knowledge and understanding of Target Heart Rate Range (THRR);Understanding of Exercise Prescription Able to understand and use rate of perceived exertion (RPE) scale;Increase Physical Activity;Increase Strength and Stamina;Knowledge and understanding of Target Heart Rate Range (THRR);Understanding of Exercise Prescription     Comments Patient able to understand and use RPE scale appropriately. Patient's goals are to lose weight, get back in shape, and eat healthier. Patient was previously walking 5-6 laps at local gym daily but has not been back because he wanted to know his parameters. Reviewed home exericse guidelines with patient, and patient plans to resume walking at the gym 30 minutes on the days he doesn't attend cardiac rehab. Patient returned to cardiac rehab for the first time since 01/22/20. Patient states that he's not returned to  walking at the [facility], but that is his plan upon completion of the cardiac rehab program on Wednesday 02/12/20.Reviewed exercise prescription with patient including endpoints and temperature precautions. Encouraged consistency with exercise routine especially to help with weight loss goals     Expected Outcomes Progress workloads as tolerated to help achieve personal health and fitness goals. Patient will walk 30 minutes 2-4 days/week in addition to exercise at cardiac rehab to help achieve personal health and fitness goals. Patient will walk at least 30 minutes 4-7 days/week to help achieve personal health and fitness  goals.            Discharge Exercise Prescription (Final Exercise Prescription Changes):  Exercise Prescription Changes - 02/10/20 0702      Response to Exercise   Blood Pressure (Admit) 124/74    Blood Pressure (Exercise) 144/80    Blood Pressure (Exit) 100/72    Heart Rate (Admit) 81 bpm    Heart Rate (Exercise) 104 bpm    Heart Rate (Exit) 76 bpm    Rating of Perceived Exertion (Exercise) 13    Symptoms none    Duration Progress to 30 minutes of  aerobic without signs/symptoms of physical distress    Intensity THRR unchanged      Progression   Progression Continue to progress workloads to maintain intensity without signs/symptoms of physical distress.    Average METs 3      Resistance Training   Training Prescription Yes    Weight 4 lbs    Reps 10-15    Time 10 Minutes      Interval Training   Interval Training No      Treadmill   MPH 3    Grade 0    Minutes 15    METs 3.3      NuStep   Level 3    SPM 85    Minutes 15    METs 2.7      Home Exercise Plan   Plans to continue exercise at [employer] (comment)    Frequency Add 4 additional days to program exercise sessions.    Initial Home Exercises Provided 01/13/20           Nutrition:  Target Goals: Understanding of nutrition guidelines, daily intake of sodium <1570m, cholesterol  <2072m calories 30% from fat and 7% or less from saturated fats, daily to have 5 or more servings of fruits and vegetables.  Biometrics:  Pre Biometrics - 12/10/19 0726      Pre Biometrics   Waist Circumference 39.5 inches    Hip Circumference 40 inches    Waist to Hip Ratio 0.99 %    Triceps Skinfold 13 mm    % Body Fat 27.4 %    Grip Strength 40.5 kg    Flexibility 0 in    Single Leg Stand 24.81 seconds            Nutrition Therapy Plan and Nutrition Goals:  Nutrition Therapy & Goals - 01/15/20 1206      Nutrition Therapy   Diet TLC    Drug/Food Interactions Statins/Certain Fruits      Personal Nutrition Goals   Nutrition Goal Pt to identify food quantities necessary to achieve weight loss of 6-24 lb at graduation from cardiac rehab.    Personal Goal #2 Pt to build a healthy plate including vegetables, fruits, whole grains, and low-fat dairy products in a heart healthy meal plan.    Personal Goal #3 Replace refined carbohydrates with whole grains for 3 servings per day      Intervention Plan   Intervention Prescribe, educate and counsel regarding individualized specific dietary modifications aiming towards targeted core components such as weight, hypertension, lipid management, diabetes, heart failure and other comorbidities.;Nutrition handout(s) given to patient.    Expected Outcomes Short Term Goal: Understand basic principles of dietary content, such as calories, fat, sodium, cholesterol and nutrients.;Long Term Goal: Adherence to prescribed nutrition plan.           Nutrition Assessments:  MEDIFICTS Score Key:  ?70 Need to make dietary changes  40-70 Heart Healthy Diet  ? 40 Therapeutic Level Cholesterol Diet   Flowsheet Row CARDIAC REHAB PHASE II EXERCISE from 12/16/2019 in [facility]  Picture Your Plate Total Score on Admission 46     Picture Your Plate Scores:  <01 Unhealthy dietary pattern with much room for  improvement.  41-50 Dietary pattern unlikely to meet recommendations for good health and room for improvement.  51-60 More healthful dietary pattern, with some room for improvement.   >60 Healthy dietary pattern, although there may be some specific behaviors that could be improved.    Nutrition Goals Re-Evaluation:  Nutrition Goals Re-Evaluation    [place] Name 01/15/20 1207 02/10/20 1018           Goals   Current Weight 189 lb (85.7 kg) 186 lb 11.7 oz (84.7 kg)      Nutrition Goal Pt to identify food quantities necessary to achieve weight loss of 6-24 lb at graduation from cardiac rehab. Pt to identify food quantities necessary to achieve weight loss of 6-24 lb at graduation from cardiac rehab.             Personal Goal #2 Re-Evaluation   Personal Goal #2 Pt to build a healthy plate including vegetables, fruits, whole grains, and low-fat dairy products in a heart healthy meal plan. Pt to build a healthy plate including vegetables, fruits, whole grains, and low-fat dairy products in a heart healthy meal plan.             Personal Goal #3 Re-Evaluation   Personal Goal #3 Replace refined carbohydrates with whole grains for 3 servings per day Replace refined carbohydrates with whole grains for 3 servings per day             Nutrition Goals Re-Evaluation:  Nutrition Goals Re-Evaluation    [place] Name 01/15/20 1207 02/10/20 1018           Goals   Current Weight 189 lb (85.7 kg) 186 lb 11.7 oz (84.7 kg)      Nutrition Goal Pt to identify food quantities necessary to achieve weight loss of 6-24 lb at graduation from cardiac rehab. Pt to identify food quantities necessary to achieve weight loss of 6-24 lb at graduation from cardiac rehab.             Personal Goal #2 Re-Evaluation   Personal Goal #2 Pt to build a healthy plate including vegetables, fruits, whole grains, and low-fat dairy products in a heart healthy meal plan. Pt to build a healthy plate including vegetables, fruits,  whole grains, and low-fat dairy products in a heart healthy meal plan.             Personal Goal #3 Re-Evaluation   Personal Goal #3 Replace refined carbohydrates with whole grains for 3 servings per day Replace refined carbohydrates with whole grains for 3 servings per day             Nutrition Goals Discharge (Final Nutrition Goals Re-Evaluation):  Nutrition Goals Re-Evaluation - 02/10/20 1018      Goals   Current Weight 186 lb 11.7 oz (84.7 kg)    Nutrition Goal Pt to identify food quantities necessary to achieve weight loss of 6-24 lb at graduation from cardiac rehab.      Personal Goal #2 Re-Evaluation   Personal Goal #2 Pt to build a healthy plate including vegetables, fruits, whole grains, and low-fat dairy products in a heart healthy meal plan.  Personal Goal #3 Re-Evaluation   Personal Goal #3 Replace refined carbohydrates with whole grains for 3 servings per day           Psychosocial: Target Goals: Acknowledge presence or absence of significant depression and/or stress, maximize coping skills, provide positive support system. Participant is able to verbalize types and ability to use techniques and skills needed for reducing stress and depression.  Initial Review & Psychosocial Screening:  Initial Psych Review & Screening - 12/10/19 1610      Initial Review   Current issues with History of Depression;Current Anxiety/Panic;Current Stress Concerns    Source of Stress Concerns Unable to perform yard/household activities;Chronic Illness      Family Dynamics   Good Support System? Yes   Dylan Lane has his brother for support     Barriers   Psychosocial barriers to participate in program The patient should benefit from training in stress management and relaxation.      Screening Interventions   Interventions Encouraged to exercise    Expected Outcomes Long Term Goal: Stressors or current issues are controlled or eliminated.;Short Term goal: Identification and review  with participant of any Quality of Life or Depression concerns found by scoring the questionnaire.;Long Term goal: The participant improves quality of Life and PHQ9 Scores as seen by post scores and/or verbalization of changes           Quality of Life Scores:  Quality of Life - 12/10/19 0855      Quality of Life   Select Quality of Life      Quality of Life Scores   Health/Function Pre 25.04 %    Socioeconomic Pre 30 %    Psych/Spiritual Pre 30 %    Family Pre 30 %    GLOBAL Pre 27.92 %          Scores of 19 and below usually indicate a poorer quality of life in these areas.  A difference of  2-3 points is a clinically meaningful difference.  A difference of 2-3 points in the total score of the Quality of Life Index has been associated with significant improvement in overall quality of life, self-image, physical symptoms, and general health in studies assessing change in quality of life.  PHQ-9: Recent Review Flowsheet Data    Depression screen [facility] 2/9 12/10/2019 12/06/2018 07/16/2018 11/03/2017 09/07/2017   Decreased Interest 0 0 0 0 1   Down, Depressed, Hopeless 1  0 0 0 0   PHQ - 2 Score 1 0 0 0 1   Altered sleeping - 1  - - 0   Tired, decreased energy - 0 - - 0   Change in appetite - 2 - - 0   Feeling bad or failure about yourself  - 0 - - 0   Trouble concentrating - 0 - - 0   Moving slowly or fidgety/restless - 0 - - 0   Suicidal thoughts - 0 - - 0   PHQ-9 Score - 3 - - 1   Difficult doing work/chores - Not difficult at all - - Not difficult at all     Interpretation of Total Score  Total Score Depression Severity:  1-4 = Minimal depression, 5-9 = Mild depression, 10-14 = Moderate depression, 15-19 = Moderately severe depression, 20-27 = Severe depression   Psychosocial Evaluation and Intervention:  Psychosocial Evaluation - 12/16/19 1158      Psychosocial Evaluation & Interventions   Interventions Stress management education;Encouraged to exercise with the program  and  follow exercise prescription    Comments Dylan Lane admits to a history of depression and anxiety. His symptoms are currently managed without medications. He uses exercise (walking) as a stress reducer. He admits to having a strong support system of family and friends. He has a positive outlook and attitude. Denies needs for psychosocial interventions related to stress and anxiety at this time. His greatest source of stress is his inablilty to do physical chores and his current health status.    Expected Outcomes Dylan Lane will verbalize stress and anxiey symptom reduction as he grows more confident in his ability to manage his CAD. He will continue to have a positive attitude and outlook.    Continue Psychosocial Services  Follow up required by staff           Psychosocial Re-Evaluation:  Psychosocial Re-Evaluation    [place] Name 12/16/19 1213 01/10/20 1345 02/10/20 1213         Psychosocial Re-Evaluation   Current issues with -- History of Depression;Current Anxiety/Panic;Current Stress Concerns History of Depression;Current Anxiety/Panic;Current Stress Concerns     Comments Dylan Lane admits to a history of depression and anxiety. His symptoms are currently managed without medications. He uses exercise (walking) as a stress reducer. He admits to having a strong support system of family and friends. He has a positive outlook and attitude. Denies needs for psychosocial interventions related to stress and anxiety at this time. His greatest source of stress is his inablilty to do physical chores and his current health status. Unable to reassess as the patient's last day of attendance was on 12/16/19 Dylan Lane has not voiced any increased stessors or concerns since participating in phase 2 cardiac rehab     Expected Outcomes Mr. Cush will verbalize stress and anxiey symptom reduction as he grows more confident in his ability to manage his CAD. He will continue to have a positive attitude and  outlook. -- --     Interventions Encouraged to attend Cardiac Rehabilitation for the exercise;Stress management education Encouraged to attend Cardiac Rehabilitation for the exercise;Stress management education Encouraged to attend Cardiac Rehabilitation for the exercise;Stress management education     Continue Psychosocial Services  Follow up required by staff -- --           Initial Review   Source of Stress Concerns -- Unable to perform yard/household activities;Chronic Illness Unable to perform yard/household activities;Chronic Illness            Psychosocial Discharge (Final Psychosocial Re-Evaluation):  Psychosocial Re-Evaluation - 02/10/20 1213      Psychosocial Re-Evaluation   Current issues with History of Depression;Current Anxiety/Panic;Current Stress Concerns    Comments Dylan Lane has not voiced any increased stessors or concerns since participating in phase 2 cardiac rehab    Interventions Encouraged to attend Cardiac Rehabilitation for the exercise;Stress management education      Initial Review   Source of Stress Concerns Unable to perform yard/household activities;Chronic Illness           Vocational Rehabilitation: Provide vocational rehab assistance to qualifying candidates.   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 Participant will  state understanding/return demonstration of topics presented.  Learning Barriers/Preferences:  Learning Barriers/Preferences - 12/10/19 0935      Learning Barriers/Preferences   Learning Barriers Sight   wears glasses   Learning Preferences Video;Pictoral           Education  Topics: Count Your Pulse:  -Group instruction provided by verbal instruction, demonstration, patient participation and written materials to support subject.  Instructors address importance of being able to find your pulse and how to count your pulse when at home without a heart monitor.  Patients get hands on experience counting their pulse with staff help and individually.   Heart Attack, Angina, and Risk Factor Modification:  -Group instruction provided by verbal instruction, video, and written materials to support subject.  Instructors address signs and symptoms of angina and heart attacks.    Also discuss risk factors for heart disease and how to make changes to improve heart health risk factors.   Functional Fitness:  -Group instruction provided by verbal instruction, demonstration, patient participation, and written materials to support subject.  Instructors address safety measures for doing things around the house.  Discuss how to get up and down off the floor, how to pick things up properly, how to safely get out of a chair without assistance, and balance training.   Meditation and Mindfulness:  -Group instruction provided by verbal instruction, patient participation, and written materials to support subject.  Instructor addresses importance of mindfulness and meditation practice to help reduce stress and improve awareness.  Instructor also leads participants through a meditation exercise.    Stretching for Flexibility and Mobility:  -Group instruction provided by verbal instruction, patient participation, and written materials to support subject.  Instructors lead participants through series of stretches that are designed to increase flexibility thus improving mobility.  These stretches are additional exercise for major muscle groups that are typically performed during regular warm up and cool down.   Hands Only CPR:  -Group verbal, video, and participation provides a basic overview of  AHA guidelines for community CPR. Role-play of emergencies allow participants the opportunity to practice calling for help and chest compression technique with discussion of AED use.   Hypertension: -Group verbal and written instruction that provides a basic overview of hypertension including the most recent diagnostic guidelines, risk factor reduction with self-care instructions and medication management.    Nutrition I class: Heart Healthy Eating:  -Group instruction provided by PowerPoint slides, verbal discussion, and written materials to support subject matter. The instructor gives an explanation and review of the Therapeutic Lifestyle Changes diet recommendations, which includes a discussion on lipid goals, dietary fat, sodium, fiber, plant stanol/sterol esters, sugar, and the components of a well-balanced, healthy diet.   Nutrition II class: Lifestyle Skills:  -Group instruction provided by PowerPoint slides, verbal discussion, and written materials to support subject matter. The instructor gives an explanation and review of label reading, grocery shopping for heart health, heart healthy recipe modifications, and ways to make healthier choices when eating out.   Diabetes Question & Answer:  -Group instruction provided by PowerPoint slides, verbal discussion, and written materials to support subject matter. The instructor gives an explanation and review of diabetes co-morbidities, pre- and post-prandial blood glucose goals, pre-exercise blood glucose goals, signs, symptoms, and treatment of hypoglycemia and hyperglycemia, and foot care basics.   Diabetes Blitz:  -Group instruction provided by PowerPoint slides, verbal discussion, and written materials to support subject matter. The instructor gives an explanation and review of the physiology behind type  1 and type 2 diabetes, diabetes medications and rational behind using different medications, pre- and post-prandial blood glucose  recommendations and Hemoglobin A1c goals, diabetes diet, and exercise including blood glucose guidelines for exercising safely.    Portion Distortion:  -Group instruction provided by PowerPoint slides, verbal discussion, written materials, and food models to support subject matter. The instructor gives an explanation of serving size versus portion size, changes in portions sizes over the last 20 years, and what consists of a serving from each food group.   Stress Management:  -Group instruction provided by verbal instruction, video, and written materials to support subject matter.  Instructors review role of stress in heart disease and how to cope with stress positively.     Exercising on Your Own:  -Group instruction provided by verbal instruction, power point, and written materials to support subject.  Instructors discuss benefits of exercise, components of exercise, frequency and intensity of exercise, and end points for exercise.  Also discuss use of nitroglycerin and activating EMS.  Review options of places to exercise outside of rehab.  Review guidelines for sex with heart disease.   Cardiac Drugs I:  -Group instruction provided by verbal instruction and written materials to support subject.  Instructor reviews cardiac drug classes: antiplatelets, anticoagulants, beta blockers, and statins.  Instructor discusses reasons, side effects, and lifestyle considerations for each drug class.   Cardiac Drugs II:  -Group instruction provided by verbal instruction and written materials to support subject.  Instructor reviews cardiac drug classes: angiotensin converting enzyme inhibitors (ACE-I), angiotensin II receptor blockers (ARBs), nitrates, and calcium channel blockers.  Instructor discusses reasons, side effects, and lifestyle considerations for each drug class.   Anatomy and Physiology of the Circulatory System:  Group verbal and written instruction and models provide basic cardiac anatomy  and physiology, with the coronary electrical and arterial systems. Review of: AMI, Angina, Valve disease, Heart Failure, Peripheral Artery Disease, Cardiac Arrhythmia, Pacemakers, and the ICD.   Other Education:  -Group or individual verbal, written, or video instructions that support the educational goals of the cardiac rehab program.   Holiday Eating Survival Tips:  -Group instruction provided by PowerPoint slides, verbal discussion, and written materials to support subject matter. The instructor gives patients tips, tricks, and techniques to help them not only survive but enjoy the holidays despite the onslaught of food that accompanies the holidays.   Knowledge Questionnaire Score:  Knowledge Questionnaire Score - 12/10/19 0853      Knowledge Questionnaire Score   Pre Score 4/24           Core Components/Risk Factors/Patient Goals at Admission:  Personal Goals and Risk Factors at Admission - 12/10/19 0939      Core Components/Risk Factors/Patient Goals on Admission   Expected Outcomes Short Term: Continued assessment and intervention until BP is < 140/41m HG in hypertensive participants. < 130/856mHG in hypertensive participants with diabetes, heart failure or chronic kidney disease.;Long Term: Maintenance of blood pressure at goal levels.    Lipids Yes    Intervention Provide education and support for participant on nutrition & aerobic/resistive exercise along with prescribed medications to achieve LDL <7079mHDL >69m81m  Expected Outcomes Short Term: Participant states understanding of desired cholesterol values and is compliant with medications prescribed. Participant is following exercise prescription and nutrition guidelines.;Long Term: Cholesterol controlled with medications as prescribed, with individualized exercise RX and with personalized nutrition plan. Value goals: LDL < 70mg64mL > 40 mg.    Stress Yes  Intervention Offer individual and/or small group education and  counseling on adjustment to heart disease, stress management and health-related lifestyle change. Teach and support self-help strategies.    Expected Outcomes Short Term: Participant demonstrates changes in health-related behavior, relaxation and other stress management skills, ability to obtain effective social support, and compliance with psychotropic medications if prescribed.;Long Term: Emotional wellbeing is indicated by absence of clinically significant psychosocial distress or social isolation.           Core Components/Risk Factors/Patient Goals Review:   Goals and Risk Factor Review    Row Name 12/16/19 1214 01/10/20 1347 01/14/20 1400 02/10/20 1214       Core Components/Risk Factors/Patient Goals Review   Personal Goals Review Weight Management/Obesity;Lipids;Stress;Hypertension Weight Management/Obesity;Lipids;Stress;Hypertension Weight Management/Obesity;Lipids;Stress;Hypertension Weight Management/Obesity;Lipids;Stress;Hypertension    Review Dylan Lane has multiple CAD risk factors. He is eager to a participate in cardiac rehab for modification and reduction of RF. He acknolodges taking medications as prescribed, walking daily, and adhering to medical provider appointments. He is eager to meet with RD for diet modifications to reduce his weight and lipids. Todays weight 188.5, patients goal weight 150-185. Todays pre-exercise resting BP 98/64 with exertional BP 130/70 and 130/80. Admits to stress however feels as his health improves so will his stress level. Anticipte to see risk factor reductions and modifications over the next 30 days as well as goal progression. Dylan Lane has only attended one exercise session on 12/16/19. Dylan Lane returned to exercise on 01/13/20 and did well with exercise. Dylan Lane has done well with exercise at cardiac rehab when in attendance. Dylan Lane will complete cardiac rehab on 02/12/20    Expected Outcomes Patient will continue to participate in CR for risk factor  reduction and exericse. Patient will continue to participate in CR for risk factor reduction and exericse. Patient will continue to participate in CR for risk factor reduction and exericse. Patient will continue to participate exercise and follow life style modifications upon completion of phase 2 cardiac rehab           Core Components/Risk Factors/Patient Goals at Discharge (Final Review):   Goals and Risk Factor Review - 02/10/20 1214      Core Components/Risk Factors/Patient Goals Review   Personal Goals Review Weight Management/Obesity;Lipids;Stress;Hypertension    Review Dylan Lane has done well with exercise at cardiac rehab when in attendance. Dylan Lane will complete cardiac rehab on 02/12/20    Expected Outcomes Patient will continue to participate exercise and follow life style modifications upon completion of phase 2 cardiac rehab           ITP Comments:  ITP Comments    Row Name 12/10/19 0727 12/16/19 1154 01/10/20 1342 01/14/20 1402 02/10/20 1117   ITP Comments Medical Director- Dr. Fransico Him, MD Mr. Sciascia completed his first cardiac rehab exercise session today and tolerated well. Denied complaints. Worked to an RPE of 9. Will re-evaluate workload on Wednesday as patient should be working to an RPE of 11-13. VSS. Mets 3.0 to 3.3. Mr Fitz is eager to participate in cardiac rehab. His personal goals are weight loss with his goal weight between 150-185. He also would like to improve his nutrition and eat a more heart healthy diet. Expect to see progression towards goals over the next 30 days. Maricela has only attended one exercise session since starting cardiac rehab on 12/16/19 30 Day ITP Review Dylan Lane returned to exercise on 01/13/20 after being absent since 12/16/19. Patient has been inconsistent with his attendance at cardiac rehab, attending 5 sessions  in 8 weeks. Patient is scheduled to graduate on Wednesday 02/10/20 and will continue exercise at fitness center. Patient has not  achieved weight loss goal thus far but has met with dietitan to discuss heart healthy diet and is still working on making consistent dietary changes. Patient states that he doesn't have any emotional stress at this time and is able to do his household chores, ADLs, etc, which was on of his goals.   Dylan Lane Name 02/10/20 1211           ITP Comments 30 Day ITP Review. Dylan Lane has good participation and fair attendance as Dylan Lane has only partcipated in 5 sessions since starting the program              Comments: See ITP comments

## 2020-02-11 ENCOUNTER — Telehealth: Payer: Self-pay | Admitting: Adult Health

## 2020-02-11 NOTE — Telephone Encounter (Signed)
Spoke to the pt.  He stated he was taken off tamsulosin when he had the heart attack.  He would like to go back on it.  Is that possible?    Pt would like to know if there is someone in the [address] office that can see him for his prostate?  Prostate doctor currently is at [facility].   - LA Grade A (one or more mucosal breaks less than 5 mm, not extending between tops of 2 mucosal folds) esophagitis with no bleeding was found at the gastroesophageal junction. No stricture or ring was seen. Findings: - The exam of the esophagus was otherwise normal. - The gastroesophageal flap valve was visualized endoscopically and classified as Hill Grade II (fold present, opens with respiration). - Mild inflammation characterized by erythema and granularity was found in the gastric antrum and in the prepyloric region of the stomach. Biopsies were taken with a cold forceps for histology and Helicobacter pylori testing. - A single umbilicated lesion measuring small in diameter was found in the gastric antrum. This is most consistent with a pancreatic rest. - The examined duodenum was normal.   Pt would like Cory to call him and explain this to him.  Pt also needs lab work ordered by Dr. Gwenlyn Found.  Can you order for him?  He mentioned cholesterol and blood sugar.

## 2020-02-11 NOTE — Telephone Encounter (Signed)
See other encounter from 02/11/20.  Nothing further needed.

## 2020-02-11 NOTE — Telephone Encounter (Signed)
I have scheduled the pt for a MyChart visit.  Nothing further needed.

## 2020-02-11 NOTE — Telephone Encounter (Signed)
Patient is requesting that a nurse give him a call regarding a medication that he is taking.  Please advise.

## 2020-02-11 NOTE — Telephone Encounter (Signed)
Please have him follow up in the office or virtual visit.

## 2020-02-12 ENCOUNTER — Encounter (HOSPITAL_COMMUNITY): Payer: Medicare HMO

## 2020-02-12 ENCOUNTER — Encounter: Payer: Self-pay | Admitting: Adult Health

## 2020-02-12 ENCOUNTER — Other Ambulatory Visit: Payer: Self-pay

## 2020-02-12 ENCOUNTER — Telehealth (INDEPENDENT_AMBULATORY_CARE_PROVIDER_SITE_OTHER): Payer: Medicare HMO | Admitting: Adult Health

## 2020-02-12 VITALS — Wt 190.0 lb

## 2020-02-12 DIAGNOSIS — R198 Other specified symptoms and signs involving the digestive system and abdomen: Secondary | ICD-10-CM | POA: Diagnosis not present

## 2020-02-12 DIAGNOSIS — R0989 Other specified symptoms and signs involving the circulatory and respiratory systems: Secondary | ICD-10-CM
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
## 2020-02-14 NOTE — Progress Notes (Signed)
Discharge Progress Report  Patient Details  Name: Dylan Lane MRN: [id number] Date of Birth: 04-29-1950 Referring Provider:   Flowsheet Row CARDIAC REHAB PHASE II ORIENTATION from 12/10/2019 in [place]  Referring Provider Belva Crome, MD       Number of Visits: 5 of 24  Reason for Discharge:  Patient reached a stable level of exercise. Patient independent in their exercise. Patient has met program and personal goals.  Smoking History:  Social History   Tobacco Use  Smoking Status Former Smoker  . Types: Cigarettes  . Quit date: 03/14/2000  . Years since quitting: 19.9  Smokeless Tobacco Never Used    Diagnosis:  10/21/19 NSTEMI  10/21/19 DES distal Cfx  ADL UCSD:   Initial Exercise Prescription:  Initial Exercise Prescription - 12/10/19 0900      Date of Initial Exercise RX and Referring Provider   Date 12/10/19    Referring Provider Belva Crome, MD    Expected Discharge Date 01/01/20      Treadmill   MPH 3    Grade 0    Minutes 15    METs 3.3      NuStep   Level 3    SPM 85    Minutes 15    METs 2.5      Prescription Details   Frequency (times per week) 2    Duration Progress to 30 minutes of continuous aerobic without signs/symptoms of physical distress      Intensity   THRR 40-80% of Max Heartrate 62-123    Ratings of Perceived Exertion 11-13    Perceived Dyspnea 0-4      Progression   Progression Continue to progress workloads to maintain intensity without signs/symptoms of physical distress.      Resistance Training   Training Prescription Yes    Weight 4 lbs    Reps 10-15           Discharge Exercise Prescription (Final Exercise Prescription Changes):  Exercise Prescription Changes - 02/10/20 0702      Response to Exercise   Blood Pressure (Admit) 124/74    Blood Pressure (Exercise) 144/80    Blood Pressure (Exit) 100/72    Heart Rate (Admit) 81 bpm    Heart Rate (Exercise) 104  bpm    Heart Rate (Exit) 76 bpm    Rating of Perceived Exertion (Exercise) 13    Symptoms none    Duration Progress to 30 minutes of  aerobic without signs/symptoms of physical distress    Intensity THRR unchanged      Progression   Progression Continue to progress workloads to maintain intensity without signs/symptoms of physical distress.    Average METs 3      Resistance Training   Training Prescription Yes    Weight 4 lbs    Reps 10-15    Time 10 Minutes      Interval Training   Interval Training No      Treadmill   MPH 3    Grade 0    Minutes 15    METs 3.3      NuStep   Level 3    SPM 85    Minutes 15    METs 2.7      Home Exercise Plan   Plans to continue exercise at [employer] (comment)    Frequency Add 4 additional days to program exercise sessions.    Initial Home Exercises Provided 01/13/20  Functional Capacity:  6 Minute Walk    Row Name 12/10/19 0814         6 Minute Walk   Phase Initial     Distance 1641 feet     Walk Time 6 minutes     # of Rest Breaks 0     MPH 3.11     METS 3.29     RPE 13     Perceived Dyspnea  0     VO2 Peak 11.51     Symptoms No     Resting HR 76 bpm     Resting BP 108/70     Resting Oxygen Saturation  98 %     Exercise Oxygen Saturation  during 6 min walk 97 %     Max Ex. HR 85 bpm     Max Ex. BP 112/78     2 Minute Post BP 98/66            Psychological, QOL, Others - Outcomes: PHQ 2/9: Depression screen [facility] 2/9 12/10/2019 12/06/2018 07/16/2018 11/03/2017 09/07/2017  Decreased Interest 0 0 0 0 1  Down, Depressed, Hopeless 1 0 0 0 0  PHQ - 2 Score 1 0 0 0 1  Altered sleeping - 1 - - 0  Tired, decreased energy - 0 - - 0  Change in appetite - 2 - - 0  Feeling bad or failure about yourself  - 0 - - 0  Trouble concentrating - 0 - - 0  Moving slowly or fidgety/restless - 0 - - 0  Suicidal thoughts - 0 - - 0  PHQ-9 Score - 3 - - 1  Difficult doing work/chores - Not difficult at all - - Not  difficult at all  Some recent data might be hidden    Quality of Life:  Quality of Life - 12/10/19 0855      Quality of Life   Select Quality of Life      Quality of Life Scores   Health/Function Pre 25.04 %    Socioeconomic Pre 30 %    Psych/Spiritual Pre 30 %    Family Pre 30 %    GLOBAL Pre 27.92 %           Personal Goals: Goals established at orientation with interventions provided to work toward goal.  Personal Goals and Risk Factors at Admission - 12/10/19 0939      Core Components/Risk Factors/Patient Goals on Admission   Expected Outcomes Short Term: Continued assessment and intervention until BP is < 140/46m HG in hypertensive participants. < 130/868mHG in hypertensive participants with diabetes, heart failure or chronic kidney disease.;Long Term: Maintenance of blood pressure at goal levels.    Lipids Yes    Intervention Provide education and support for participant on nutrition & aerobic/resistive exercise along with prescribed medications to achieve LDL <7069mHDL >55m28m  Expected Outcomes Short Term: Participant states understanding of desired cholesterol values and is compliant with medications prescribed. Participant is following exercise prescription and nutrition guidelines.;Long Term: Cholesterol controlled with medications as prescribed, with individualized exercise RX and with personalized nutrition plan. Value goals: LDL < 70mg48mL > 40 mg.    Stress Yes    Intervention Offer individual and/or small group education and counseling on adjustment to heart disease, stress management and health-related lifestyle change. Teach and support self-help strategies.    Expected Outcomes Short Term: Participant demonstrates changes in health-related behavior, relaxation and other stress management skills, ability to obtain  effective social support, and compliance with psychotropic medications if prescribed.;Long Term: Emotional wellbeing is indicated by absence of  clinically significant psychosocial distress or social isolation.            Personal Goals Discharge:  Goals and Risk Factor Review    Row Name 12/16/19 1214 01/10/20 1347 01/14/20 1400 02/10/20 1214       Core Components/Risk Factors/Patient Goals Review   Personal Goals Review Weight Management/Obesity;Lipids;Stress;Hypertension Weight Management/Obesity;Lipids;Stress;Hypertension Weight Management/Obesity;Lipids;Stress;Hypertension Weight Management/Obesity;Lipids;Stress;Hypertension    Review Mr. Montfort has multiple CAD risk factors. He is eager to a participate in cardiac rehab for modification and reduction of RF. He acknolodges taking medications as prescribed, walking daily, and adhering to medical provider appointments. He is eager to meet with RD for diet modifications to reduce his weight and lipids. Todays weight 188.5, patients goal weight 150-185. Todays pre-exercise resting BP 98/64 with exertional BP 130/70 and 130/80. Admits to stress however feels as his health improves so will his stress level. Anticipte to see risk factor reductions and modifications over the next 30 days as well as goal progression. Nitish has only attended one exercise session on 12/16/19. Goerge returned to exercise on 01/13/20 and did well with exercise. Shelva Majestic has done well with exercise at cardiac rehab when in attendance. Kishon will complete cardiac rehab on 02/12/20    Expected Outcomes Patient will continue to participate in CR for risk factor reduction and exericse. Patient will continue to participate in CR for risk factor reduction and exericse. Patient will continue to participate in CR for risk factor reduction and exericse. Patient will continue to participate exercise and follow life style modifications upon completion of phase 2 cardiac rehab           Exercise Goals and Review:  Exercise Goals    Row Name 12/10/19 0804             Exercise Goals   Increase Physical Activity Yes        Intervention Provide advice, education, support and counseling about physical activity/exercise needs.;Develop an individualized exercise prescription for aerobic and resistive training based on initial evaluation findings, risk stratification, comorbidities and participant's personal goals.       Expected Outcomes Short Term: Attend rehab on a regular basis to increase amount of physical activity.;Long Term: Add in home exercise to make exercise part of routine and to increase amount of physical activity.;Long Term: Exercising regularly at least 3-5 days a week.       Increase Strength and Stamina Yes       Intervention Provide advice, education, support and counseling about physical activity/exercise needs.;Develop an individualized exercise prescription for aerobic and resistive training based on initial evaluation findings, risk stratification, comorbidities and participant's personal goals.       Expected Outcomes Short Term: Increase workloads from initial exercise prescription for resistance, speed, and METs.;Short Term: Perform resistance training exercises routinely during rehab and add in resistance training at home;Long Term: Improve cardiorespiratory fitness, muscular endurance and strength as measured by increased METs and functional capacity (6MWT)       Able to understand and use rate of perceived exertion (RPE) scale Yes       Intervention Provide education and explanation on how to use RPE scale       Expected Outcomes Short Term: Able to use RPE daily in rehab to express subjective intensity level;Long Term:  Able to use RPE to guide intensity level when exercising independently  Knowledge and understanding of Target Heart Rate Range (THRR) Yes       Intervention Provide education and explanation of THRR including how the numbers were predicted and where they are located for reference       Expected Outcomes Short Term: Able to state/look up THRR;Long Term: Able to use THRR to govern  intensity when exercising independently;Short Term: Able to use daily as guideline for intensity in rehab       Able to check pulse independently Yes       Intervention Provide education and demonstration on how to check pulse in carotid and radial arteries.;Review the importance of being able to check your own pulse for safety during independent exercise       Expected Outcomes Short Term: Able to explain why pulse checking is important during independent exercise;Long Term: Able to check pulse independently and accurately       Understanding of Exercise Prescription Yes       Intervention Provide education, explanation, and written materials on patient's individual exercise prescription       Expected Outcomes Short Term: Able to explain program exercise prescription;Long Term: Able to explain home exercise prescription to exercise independently              Exercise Goals Re-Evaluation:  Exercise Goals Re-Evaluation    Row Name 12/16/19 0806 01/13/20 0734 02/10/20 0731         Exercise Goal Re-Evaluation   Exercise Goals Review Able to understand and use rate of perceived exertion (RPE) scale;Increase Physical Activity Able to understand and use rate of perceived exertion (RPE) scale;Increase Physical Activity;Increase Strength and Stamina;Knowledge and understanding of Target Heart Rate Range (THRR);Understanding of Exercise Prescription Able to understand and use rate of perceived exertion (RPE) scale;Increase Physical Activity;Increase Strength and Stamina;Knowledge and understanding of Target Heart Rate Range (THRR);Understanding of Exercise Prescription     Comments Patient able to understand and use RPE scale appropriately. Patient's goals are to lose weight, get back in shape, and eat healthier. Patient was previously walking 5-6 laps at local gym daily but has not been back because he wanted to know his parameters. Reviewed home exericse guidelines with patient, and patient plans to  resume walking at the gym 30 minutes on the days he doesn't attend cardiac rehab. Patient returned to cardiac rehab for the first time since 01/22/20. Patient states that he's not returned to walking at the [facility], but that is his plan upon completion of the cardiac rehab program on Wednesday 02/12/20.Reviewed exercise prescription with patient including endpoints and temperature precautions. Encouraged consistency with exercise routine especially to help with weight loss goals     Expected Outcomes Progress workloads as tolerated to help achieve personal health and fitness goals. Patient will walk 30 minutes 2-4 days/week in addition to exercise at cardiac rehab to help achieve personal health and fitness goals. Patient will walk at least 30 minutes 4-7 days/week to help achieve personal health and fitness goals.            Nutrition & Weight - Outcomes:  Pre Biometrics - 12/10/19 0726      Pre Biometrics   Waist Circumference 39.5 inches    Hip Circumference 40 inches    Waist to Hip Ratio 0.99 %    Triceps Skinfold 13 mm    % Body Fat 27.4 %    Grip Strength 40.5 kg    Flexibility 0 in    Single Leg Stand 24.81 seconds  Nutrition:  Nutrition Therapy & Goals - 01/15/20 1206      Nutrition Therapy   Diet TLC    Drug/Food Interactions Statins/Certain Fruits      Personal Nutrition Goals   Nutrition Goal Pt to identify food quantities necessary to achieve weight loss of 6-24 lb at graduation from cardiac rehab.    Personal Goal #2 Pt to build a healthy plate including vegetables, fruits, whole grains, and low-fat dairy products in a heart healthy meal plan.    Personal Goal #3 Replace refined carbohydrates with whole grains for 3 servings per day      Intervention Plan   Intervention Prescribe, educate and counsel regarding individualized specific dietary modifications aiming towards targeted core components such as weight, hypertension, lipid management, diabetes,  heart failure and other comorbidities.;Nutrition handout(s) given to patient.    Expected Outcomes Short Term Goal: Understand basic principles of dietary content, such as calories, fat, sodium, cholesterol and nutrients.;Long Term Goal: Adherence to prescribed nutrition plan.           Nutrition Discharge:   Education Questionnaire Score:  Knowledge Questionnaire Score - 12/10/19 0853      Knowledge Questionnaire Score   Pre Score 4/24           Patient completed the cardiac rehab program on 02/10/2020 after completing 5 of 24 sessions. Patient missed his final scheduled visit. Patient's attendance was sporadic throughout his time in the program. Patient has not been consistent with his home exercise but plans to resume daily walking at the [facility]. Goals reviewed with patient; copy given to patient.

## 2020-02-17 DIAGNOSIS — N401 Enlarged prostate with lower urinary tract symptoms: Secondary | ICD-10-CM | POA: Diagnosis not present

## 2020-02-17 DIAGNOSIS — R350 Frequency of micturition: Secondary | ICD-10-CM | POA: Diagnosis not present

## 2020-03-02 ENCOUNTER — Other Ambulatory Visit: Payer: Self-pay | Admitting: Adult Health

## 2020-03-02 DIAGNOSIS — R0789 Other chest pain: Secondary | ICD-10-CM

## 2020-03-02 NOTE — Telephone Encounter (Signed)
  amLODipine (NORVASC) 10 MG tablet  [facility] [place], [place] - [address] Phone:  [phone]  Fax:  [phone]

## 2020-04-08 ENCOUNTER — Other Ambulatory Visit: Payer: Self-pay

## 2020-04-08 ENCOUNTER — Encounter: Payer: Self-pay | Admitting: Adult Health

## 2020-04-08 DIAGNOSIS — L608 Other nail disorders: Secondary | ICD-10-CM

## 2020-04-14 ENCOUNTER — Telehealth: Payer: Self-pay | Admitting: Adult Health

## 2020-04-14 ENCOUNTER — Other Ambulatory Visit: Payer: Self-pay

## 2020-04-14 ENCOUNTER — Telehealth (INDEPENDENT_AMBULATORY_CARE_PROVIDER_SITE_OTHER): Payer: Medicare HMO | Admitting: Adult Health

## 2020-04-14 DIAGNOSIS — R369 Urethral discharge, unspecified: Secondary | ICD-10-CM

## 2020-04-14 NOTE — Telephone Encounter (Signed)
Pt call and stated he want a referral to go to a podiatrist .

## 2020-04-14 NOTE — Telephone Encounter (Signed)
Spoke with the pt and informed him the referral was placed on 4/6 to [name].  Patient was advised to call their office at [phone] as a note in the referral stated they have tried reaching the pt.

## 2020-04-14 NOTE — Progress Notes (Signed)
Virtual Visit via Telephone Note  I connected with [name] on 04/14/20 at 11:00 AM EDT by telephone and verified that I am speaking with the correct person using two identifiers.   I discussed the limitations, risks, security and privacy concerns of performing an evaluation and management service by telephone and the availability of in person appointments. I also discussed with the patient that there may be a patient responsible charge related to this service. The patient expressed understanding and agreed to proceed.  Location patient: home Location provider: work or home office Participants present for the call: patient, provider Patient did not have a visit in the prior 7 days to address this/these issue(s).   History of Present Illness: 70 year old male who  has a past medical history of Arthritis, CAD (coronary artery disease), native coronary artery, Colon polyps, GERD (gastroesophageal reflux disease) (2017), Hyperlipidemia, Hypertension, Myocardial infarction ([place]), Pre-diabetes, Rectal bleeding, and Scoliosis.  He is being evaluated today for an acute concern.  He reports that over the last week he has noticed that when he wakes up in the middle of the night to urinate that he may have "a white stain in my underwear".  He denies TIA symptoms and is not concerned for an STD.  He does have BPH and will often "leak".  At no other time through the day has he noticed this white stain.   Observations/Objective: Patient sounds cheerful and well on the phone. I do not appreciate any SOB. Speech and thought processing are grossly intact. Patient reported vitals:  Assessment and Plan: 1. Penile discharge -Advised that it was likely normal seminal fluid.  Since he has no UTI-like symptoms and no concern for an STD no need to check urinalysis or STD panel today.  Advise follow-up as needed  Follow Up Instructions:  I did not refer this patient for an OV in the next 24 hours for  this/these issue(s).  I discussed the assessment and treatment plan with the patient. The patient was provided an opportunity to ask questions and all were answered. The patient agreed with the plan and demonstrated an understanding of the instructions.   The patient was advised to call back or seek an in-person evaluation if the symptoms worsen or if the condition fails to improve as anticipated.  I provided 11 minutes of non-face-to-face time during this encounter.   [name], NP
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
## 2020-04-20 ENCOUNTER — Ambulatory Visit (INDEPENDENT_AMBULATORY_CARE_PROVIDER_SITE_OTHER): Payer: Medicare HMO

## 2020-04-20 ENCOUNTER — Other Ambulatory Visit: Payer: Self-pay

## 2020-04-20 VITALS — BP 110/64 | HR 57 | Temp 97.9°F | Ht 67.0 in | Wt 193.0 lb

## 2020-04-20 DIAGNOSIS — Z0000 Encounter for general adult medical examination without abnormal findings: Secondary | ICD-10-CM

## 2020-04-20 DIAGNOSIS — Z789 Other specified health status: Secondary | ICD-10-CM | POA: Diagnosis not present

## 2020-04-20 DIAGNOSIS — E785 Hyperlipidemia, unspecified: Secondary | ICD-10-CM | POA: Diagnosis not present

## 2020-04-20 DIAGNOSIS — I10 Essential (primary) hypertension: Secondary | ICD-10-CM | POA: Diagnosis not present

## 2020-04-20 DIAGNOSIS — Z0100 Encounter for examination of eyes and vision without abnormal findings: Secondary | ICD-10-CM

## 2020-04-20 LAB — LIPID PANEL
Chol/HDL Ratio: 2.9 ratio (ref 0.0–5.0)
Cholesterol, Total: 119 mg/dL (ref 100–199)
HDL: 41 mg/dL (ref >39)
LDL Chol Calc (NIH): 65 mg/dL (ref 0–99)
Triglycerides: 57 mg/dL (ref 0–149)
VLDL Cholesterol Cal: 13 mg/dL (ref 5–40)

## 2020-04-20 LAB — BASIC METABOLIC PANEL
BUN/Creatinine Ratio: 11 (ref 10–24)
BUN: 12 mg/dL (ref 8–27)
CO2: 26 mmol/L (ref 20–29)
Calcium: 10.1 mg/dL (ref 8.6–10.2)
Chloride: 104 mmol/L (ref 96–106)
Creatinine, Ser: 1.1 mg/dL (ref 0.76–1.27)
Glucose: 101 mg/dL — ABNORMAL HIGH (ref 65–99)
Potassium: 4 mmol/L (ref 3.5–5.2)
Sodium: 142 mmol/L (ref 134–144)
eGFR: 74 mL/min/{1.73_m2} (ref >59)

## 2020-04-20 NOTE — Patient Instructions (Addendum)
Dylan Lane , Thank you for taking time to come for your Medicare Wellness Visit. I appreciate your ongoing commitment to your health goals. Please review the following plan we discussed and let me know if I can assist you in the future.   Screening recommendations/referrals: Colonoscopy: current due 03/21/2021 Recommended yearly ophthalmology/optometry visit for glaucoma screening and checkup Recommended yearly dental visit for hygiene and checkup  Vaccinations: Influenza vaccine: current due fall 2022 Pneumococcal vaccine: completed series Tdap vaccine: due upon injury  Shingles vaccine: will obtain local pharmacy   Advanced directives: will provide copies   Conditions/risks identified: pharmacy consult medication cost   Next appointment: 07/16/2020  @ 1000am  [name]  Preventive Care 68 Years and Older, Male Preventive care refers to lifestyle choices and visits with your health care provider that can promote health and wellness. What does preventive care include?  A yearly physical exam. This is also called an annual well check.  Dental exams once or twice a year.  Routine eye exams. Ask your health care provider how often you should have your eyes checked.  Personal lifestyle choices, including:  Daily care of your teeth and gums.  Regular physical activity.  Eating a healthy diet.  Avoiding tobacco and drug use.  Limiting alcohol use.  Practicing safe sex.  Taking low doses of aspirin every day.  Taking vitamin and mineral supplements as recommended by your health care provider. What happens during an annual well check? The services and screenings done by your health care provider during your annual well check will depend on your age, overall health, lifestyle risk factors, and family history of disease. Counseling  Your health care provider may ask you questions about your:  Alcohol use.  Tobacco use.  Drug use.  Emotional well-being.  Home and  relationship well-being.  Sexual activity.  Eating habits.  History of falls.  Memory and ability to understand (cognition).  Work and work Statistician. Screening  You may have the following tests or measurements:  Height, weight, and BMI.  Blood pressure.  Lipid and cholesterol levels. These may be checked every 5 years, or more frequently if you are over 29 years old.  Skin check.  Lung cancer screening. You may have this screening every year starting at age 68 if you have a 30-pack-year history of smoking and currently smoke or have quit within the past 15 years.  Fecal occult blood test (FOBT) of the stool. You may have this test every year starting at age 68.  Flexible sigmoidoscopy or colonoscopy. You may have a sigmoidoscopy every 5 years or a colonoscopy every 10 years starting at age 68.  Prostate cancer screening. Recommendations will vary depending on your family history and other risks.  Hepatitis C blood test.  Hepatitis B blood test.  Sexually transmitted disease (STD) testing.  Diabetes screening. This is done by checking your blood sugar (glucose) after you have not eaten for a while (fasting). You may have this done every 1-3 years.  Abdominal aortic aneurysm (AAA) screening. You may need this if you are a current or former smoker.  Osteoporosis. You may be screened starting at age 68 if you are at high risk. Talk with your health care provider about your test results, treatment options, and if necessary, the need for more tests. Vaccines  Your health care provider may recommend certain vaccines, such as:  Influenza vaccine. This is recommended every year.  Tetanus, diphtheria, and acellular pertussis (Tdap, Td) vaccine. You may need  a Td booster every 10 years.  Zoster vaccine. You may need this after age 68.  Pneumococcal 13-valent conjugate (PCV13) vaccine. One dose is recommended after age 5.  Pneumococcal polysaccharide (PPSV23) vaccine. One  dose is recommended after age 80. Talk to your health care provider about which screenings and vaccines you need and how often you need them. This information is not intended to replace advice given to you by your health care provider. Make sure you discuss any questions you have with your health care provider. Document Released: 01/16/2015 Document Revised: 09/09/2015 Document Reviewed: 10/21/2014 Elsevier Interactive Patient Education  2017 [place] Prevention in the Home Falls can cause injuries. They can happen to people of all ages. There are many things you can do to make your home safe and to help prevent falls. What can I do on the outside of my home?  Regularly fix the edges of walkways and driveways and fix any cracks.  Remove anything that might make you trip as you walk through a door, such as a raised step or threshold.  Trim any bushes or trees on the path to your home.  Use bright outdoor lighting.  Clear any walking paths of anything that might make someone trip, such as rocks or tools.  Regularly check to see if handrails are loose or broken. Make sure that both sides of any steps have handrails.  Any raised decks and porches should have guardrails on the edges.  Have any leaves, snow, or ice cleared regularly.  Use sand or salt on walking paths during winter.  Clean up any spills in your garage right away. This includes oil or grease spills. What can I do in the bathroom?  Use night lights.  Install grab bars by the toilet and in the tub and shower. Do not use towel bars as grab bars.  Use non-skid mats or decals in the tub or shower.  If you need to sit down in the shower, use a plastic, non-slip stool.  Keep the floor dry. Clean up any water that spills on the floor as soon as it happens.  Remove soap buildup in the tub or shower regularly.  Attach bath mats securely with double-sided non-slip rug tape.  Do not have throw rugs and other  things on the floor that can make you trip. What can I do in the bedroom?  Use night lights.  Make sure that you have a light by your bed that is easy to reach.  Do not use any sheets or blankets that are too big for your bed. They should not hang down onto the floor.  Have a firm chair that has side arms. You can use this for support while you get dressed.  Do not have throw rugs and other things on the floor that can make you trip. What can I do in the kitchen?  Clean up any spills right away.  Avoid walking on wet floors.  Keep items that you use a lot in easy-to-reach places.  If you need to reach something above you, use a strong step stool that has a grab bar.  Keep electrical cords out of the way.  Do not use floor polish or wax that makes floors slippery. If you must use wax, use non-skid floor wax.  Do not have throw rugs and other things on the floor that can make you trip. What can I do with my stairs?  Do not leave any items on the  stairs.  Make sure that there are handrails on both sides of the stairs and use them. Fix handrails that are broken or loose. Make sure that handrails are as long as the stairways.  Check any carpeting to make sure that it is firmly attached to the stairs. Fix any carpet that is loose or worn.  Avoid having throw rugs at the top or bottom of the stairs. If you do have throw rugs, attach them to the floor with carpet tape.  Make sure that you have a light switch at the top of the stairs and the bottom of the stairs. If you do not have them, ask someone to add them for you. What else can I do to help prevent falls?  Wear shoes that:  Do not have high heels.  Have rubber bottoms.  Are comfortable and fit you well.  Are closed at the toe. Do not wear sandals.  If you use a stepladder:  Make sure that it is fully opened. Do not climb a closed stepladder.  Make sure that both sides of the stepladder are locked into place.  Ask  someone to hold it for you, if possible.  Clearly mark and make sure that you can see:  Any grab bars or handrails.  First and last steps.  Where the edge of each step is.  Use tools that help you move around (mobility aids) if they are needed. These include:  Canes.  Walkers.  Scooters.  Crutches.  Turn on the lights when you go into a dark area. Replace any light bulbs as soon as they burn out.  Set up your furniture so you have a clear path. Avoid moving your furniture around.  If any of your floors are uneven, fix them.  If there are any pets around you, be aware of where they are.  Review your medicines with your doctor. Some medicines can make you feel dizzy. This can increase your chance of falling. Ask your doctor what other things that you can do to help prevent falls. This information is not intended to replace advice given to you by your health care provider. Make sure you discuss any questions you have with your health care provider. Document Released: 10/16/2008 Document Revised: 05/28/2015 Document Reviewed: 01/24/2014 Elsevier Interactive Patient Education  2017 [employer].

## 2020-04-21 ENCOUNTER — Ambulatory Visit: Payer: Medicare HMO | Admitting: Podiatry

## 2020-04-27 ENCOUNTER — Telehealth: Payer: Self-pay | Admitting: Adult Health

## 2020-04-27 NOTE — Telephone Encounter (Signed)
Patient would like for someone to call him and go over his results for his cholesterol.  Please advise

## 2020-04-28 ENCOUNTER — Encounter: Payer: Self-pay | Admitting: Podiatry

## 2020-04-28 ENCOUNTER — Other Ambulatory Visit: Payer: Self-pay

## 2020-04-28 ENCOUNTER — Ambulatory Visit (INDEPENDENT_AMBULATORY_CARE_PROVIDER_SITE_OTHER): Payer: Medicare HMO | Admitting: Podiatry

## 2020-04-28 DIAGNOSIS — L601 Onycholysis: Secondary | ICD-10-CM | POA: Diagnosis not present

## 2020-04-28 DIAGNOSIS — L814 Other melanin hyperpigmentation: Secondary | ICD-10-CM | POA: Diagnosis not present

## 2020-04-28 DIAGNOSIS — L608 Other nail disorders: Secondary | ICD-10-CM | POA: Diagnosis not present

## 2020-04-28 DIAGNOSIS — L603 Nail dystrophy: Secondary | ICD-10-CM | POA: Diagnosis not present
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
## 2020-04-28 NOTE — Telephone Encounter (Signed)
Patient is aware of lab resutls.

## 2020-04-29 ENCOUNTER — Telehealth: Payer: Self-pay | Admitting: Adult Health

## 2020-04-29 NOTE — Progress Notes (Signed)
  Chronic Care Management   Note  04/29/2020 Name: Dylan Lane MRN: [id number] DOB: [birth year]  Dylan Lane is a 70 y.o. year old male who is a primary care patient of [name], NP. I reached out to [employer] by phone today in response to a referral sent by Dylan Lane's PCP, [name], NP.   Dylan Lane was given information about Chronic Care Management services today including:  1. CCM service includes personalized support from designated clinical staff supervised by his physician, including individualized plan of care and coordination with other care providers 2. 24/7 contact phone numbers for assistance for urgent and routine care needs. 3. Service will only be billed when office clinical staff spend 20 minutes or more in a month to coordinate care. 4. Only one practitioner may furnish and bill the service in a calendar month. 5. The patient may stop CCM services at any time (effective at the end of the month) by phone call to the office staff.   Patient agreed to services and verbal consent obtained.   Follow up plan:   Carley Perdue [facility] Scheduler

## 2020-05-13 ENCOUNTER — Telehealth: Payer: Self-pay | Admitting: Podiatry

## 2020-05-13 ENCOUNTER — Telehealth: Payer: Self-pay | Admitting: Pharmacist

## 2020-05-13 NOTE — Telephone Encounter (Signed)
-----   Message from Garrel Ridgel, [place] sent at 05/07/2020  4:48 PM EDT ----- Please let him know that he is negative for fungal toenails.  May cancel next appointment if nothing else to discuss.

## 2020-05-13 NOTE — Chronic Care Management (AMB) (Signed)
Chronic Care Management Pharmacy Assistant   Name: Dylan Lane  MRN: [id number] DOB: 29-May-1950  Reason for Encounter: Chart Prep For Cpp visit on  05/19/2020   Conditions to be addressed/monitored: HTN, HLD and GERD  Primary concerns for visit include: Hypertension and affordable medicaiton  Recent office visits:  . 04.18.2022 Randel Pigg, LPN Medicare Wellness Exam o Referral placed to [facility] coordination and Ophthalmology  . 04.12.2022 [name], NP Penile discharge . 04.06.2022- Referral placed for podiatry  . 02.09.2022 [name], NP (PCP)- Video Visit Globus sensation  Recent consult visits:  . 04.26.2022 Garrel Ridgel, [place] Podiatry Nail dystrophy . 02.14.2022 Daine Gravel L-Urology-Benign prostatic hyperplasia with lower urinary tract symptoms . 01.26.2022 [name], [name], MD - Upper Endoscopy  . 12.21.2021 Erlene Quan, PA-C Cardiology o Medication changed o Metoprolol Tartrate :25 mg Oral 2 times daily (Change in therapy) to 25 mg Oral Daily . 12.08.2021 Noralyn Pick, NP Gastroenterology Xerostomia  Hospital visits:  Medication Reconciliation was completed by comparing discharge summary, patient's EMR and Pharmacy list, and upon discussion with patient. Admitted to the hospital on 11/20/2019 due to Dry Mouth. Discharge date was 11/20/2019. Discharged from [facility]?Medications Started at [facility] Discharge:?? -started None  Medication Changes at Hospital Discharge: -Changed None  Medications Discontinued at Hospital Discharge: -Stopped None  Medications that remain the same after Hospital Discharge:??  -All other medications will remain the same.    Patient Questions:   1. Have you seen any other providers since your last visit?  a. Podiatry 2. Any changes in your medications or health? No 3. Any side effects from any medications? No 4. Any concerns about your health right now?  No 5. Has your provider asked that you check blood pressure, blood sugar, or follow special diet at home?  a. He checks his blood pressure and frequently. 6. Do you get any type of exercise on a regular basis?  a. Walks occasionally 7. Can you think of a goal you would like to reach for your health?  a. He would like to lose 5 to 10 pounds 8. Do you have any problems getting your medications?  a. He is having some difficulties affording some of his medications 9. Is there anything that you would like to discuss during the appointment? No  The patient was asked to please bring medications, blood pressure/ blood sugar log, and supplements to his appointment.  Medication Dispensed  Quantity  Alfuzosin (UROXATRAL) 10 mg 24 hr: one daily 04.17.2022 15  Amlodipine (NORVASC) 10 mg: one daily 03.01.2022 90  Pantoprazole (PROTONIX) 40 mg: one daily 03.24.2022 90  Clopidogrel (PLAVIX) 75 mg: one daily 04.27.2022 90  Atorvastatin (LIPITOR) 80 mg: one daily [id number] 90  Nitroglycerin (NITROSTAT) 0.4 MG SL: Place 1 tablet (0.4 mg total) under the tongue every 5 (five) minutes x 3 doses as needed for chest pain 10.19.2021 25  aspirin 81 MG EC OTC   acetaminophen (TYLENOL) 325 MG OTC   metoprolol tartrate (LOPRESSOR) 25 MG 03.14.2022 60   Medications: Outpatient Encounter Medications as of 05/13/2020  Medication Sig Note  . acetaminophen (TYLENOL) 325 MG tablet Take 650 mg by mouth every 6 (six) hours as needed for mild pain or headache.   . alfuzosin (UROXATRAL) 10 MG 24 hr tablet Take 10 mg by mouth daily.   Marland Kitchen amLODipine (NORVASC) 10 MG tablet Take 1 tablet by mouth once daily   . aspirin 81 MG  EC tablet Take 1 tablet (81 mg total) by mouth daily.   Marland Kitchen atorvastatin (LIPITOR) 80 MG tablet Take 1 tablet (80 mg total) by mouth daily.   . clopidogrel (PLAVIX) 75 MG tablet Take 1 tablet (75 mg total) by mouth daily.   . metoprolol tartrate (LOPRESSOR) 25 MG tablet Take 25 mg by mouth daily.   .  nitroGLYCERIN (NITROSTAT) 0.4 MG SL tablet Place 1 tablet (0.4 mg total) under the tongue every 5 (five) minutes x 3 doses as needed for chest pain. 12/06/2019: Has not taken any reccently  . pantoprazole (PROTONIX) 40 MG tablet Take 1 tablet (40 mg total) by mouth daily.    No facility-administered encounter medications on file as of 05/13/2020.    Star Rating Drugs:  Dispensed Quantity Pharmacy  Atorvastatin 80 mg 04.10.2022 [place] ([place]) [place], [place] [phone]

## 2020-05-13 NOTE — Telephone Encounter (Signed)
lvm for patient to call and get results and possibly to cx appointment

## 2020-05-19 ENCOUNTER — Ambulatory Visit (INDEPENDENT_AMBULATORY_CARE_PROVIDER_SITE_OTHER): Payer: Medicare HMO | Admitting: Pharmacist

## 2020-05-19 DIAGNOSIS — I10 Essential (primary) hypertension: Secondary | ICD-10-CM

## 2020-05-19 DIAGNOSIS — K219 Gastro-esophageal reflux disease without esophagitis: Secondary | ICD-10-CM

## 2020-05-19 DIAGNOSIS — E785 Hyperlipidemia, unspecified: Secondary | ICD-10-CM | POA: Diagnosis not present

## 2020-05-19 NOTE — Progress Notes (Signed)
Chronic Care Management Pharmacy Note  06/02/2020 Name:  Dylan Lane MRN:  [id number] DOB:  1950-06-16  Subjective: Dylan Lane is an 70 y.o. year old male who is a primary patient of [name], NP.  The CCM team was consulted for assistance with disease management and care coordination needs.    Engaged with patient face to face for initial visit in response to provider referral for pharmacy case management and/or care coordination services.   Consent to Services:  The patient was given the following information about Chronic Care Management services today, agreed to services, and gave verbal consent: 1. CCM service includes personalized support from designated clinical staff supervised by the primary care provider, including individualized plan of care and coordination with other care providers 2. 24/7 contact phone numbers for assistance for urgent and routine care needs. 3. Service will only be billed when office clinical staff spend 20 minutes or more in a month to coordinate care. 4. Only one practitioner may furnish and bill the service in a calendar month. 5.The patient may stop CCM services at any time (effective at the end of the month) by phone call to the office staff. 6. The patient will be responsible for cost sharing (co-pay) of up to 20% of the service fee (after annual deductible is met). Patient agreed to services and consent obtained.  Patient Care Team: [name], NP as PCP - General (Family Medicine) Belva Crome, MD as PCP - Cardiology (Cardiology) Ortho, [facility] (Specialist) [name], [name], MD as Consulting Physician (Gastroenterology) Viona Gilmore, [facility] as Pharmacist (Pharmacist)  Recent office visits:  04.18.2022 Dylan Pigg, LPN: Patient presented for Medicare Wellness Exam. Placed referral to [facility] coordination and Ophthalmology.   04.12.2022 [name], NP: Patient presented for Penile discharge.   04.06.2022-  Referral placed for podiatry    02.09.2022 [name], NP (PCP): Patient presented for video visit for Globus sensation.   Recent consult visits:  04.26.2022 Garrel Ridgel, [place] Podiatry: Presented for nail dystrophy initial visit.   02.14.2022 Daine Gravel L-Urology: Presented for benign prostatic hyperplasia with lower urinary tract symptoms.   01.26.2022 Jerene Bears, MD: Presented for Upper Endoscopy    12.21.2021 Erlene Quan, [place] Cardiology: Presented for CAD and NSTEMI follow up. Patient is unable to split metoprolol. Switched to 25 mg daily.   12.08.2021 Noralyn Pick, NP Gastroenterology: Presented for Xerostomia.  Hospital visits: Admitted to the hospital on 11/20/2019 due to Dry Mouth. Discharge date was 11/20/2019. Discharged from [facility].  Objective:  Lab Results  Component Value Date   CREATININE 1.10 04/20/2020   BUN 12 04/20/2020   GFR 70.51 03/19/2019   GFRNONAA >60 11/20/2019   GFRAA 70 10/08/2019   NA 142 04/20/2020   K 4.0 04/20/2020   CALCIUM 10.1 04/20/2020   CO2 26 04/20/2020   GLUCOSE 101 (H) 04/20/2020    Lab Results  Component Value Date/Time   HGBA1C 5.8 (H) 07/16/2019 08:18 AM   HGBA1C 5.6 03/19/2019 11:04 AM   GFR 70.51 03/19/2019 11:04 AM    Last diabetic Eye exam: No results found for: HMDIABEYEEXA  Last diabetic Foot exam: No results found for: HMDIABFOOTEX   Lab Results  Component Value Date   CHOL 119 04/20/2020   HDL 41 04/20/2020   LDLCALC 65 04/20/2020   TRIG 57 04/20/2020   CHOLHDL 2.9 04/20/2020    Hepatic Function Latest Ref Rng & Units 11/04/2019 10/08/2019 07/16/2019  Total Protein  6.5 - 8.1 g/dL 6.9 7.2 7.2  Albumin 3.5 - 5.0 g/dL 4.1 - -  AST 15 - 41 U/L _0 ALT 0 - 44 U/L _1 Alk Phosphatase 38 - 126 U/L 91 - -  Total Bilirubin 0.3 - 1.2 mg/dL 0.7 0.5 0.6    Lab Results  Component Value Date/Time   TSH 1.85 07/16/2019 08:18 AM   TSH 2.320 06/28/2016  09:48 AM    CBC Latest Ref Rng & Units 11/20/2019 11/04/2019 10/22/2019  WBC 4.0 - 10.5 K/uL 5.3 9.1 7.2  Hemoglobin 13.0 - 17.0 g/dL 14.0 14.6 15.2  Hematocrit 39.0 - 52.0 % 41.0 42.4 43.4  Platelets 150 - 400 K/uL 214 235 243    Lab Results  Component Value Date/Time   VD25OH 15 (L) 06/05/2013 11:58 AM    Clinical ASCVD: Yes  The ASCVD Risk score Mikey Bussing DC Jr., et al., 2013) failed to calculate for the following reasons:   The patient has a prior MI or stroke diagnosis    Depression screen [facility] 2/9 04/20/2020 04/20/2020 12/10/2019  Decreased Interest 0 0 0  Down, Depressed, Hopeless 0 0 1  PHQ - 2 Score 0 0 1  Altered sleeping - - -  Tired, decreased energy - - -  Change in appetite - - -  Feeling bad or failure about yourself  - - -  Trouble concentrating - - -  Moving slowly or fidgety/restless - - -  Suicidal thoughts - - -  PHQ-9 Score - - -  Difficult doing work/chores - - -  Some recent data might be hidden      Social History   Tobacco Use  Smoking Status Former Smoker  . Types: Cigarettes  . Quit date: 03/14/2000  . Years since quitting: 20.2  Smokeless Tobacco Never Used   BP Readings from Last 3 Encounters:  04/20/20 110/64  02/10/20 124/74  01/29/20 105/70   Pulse Readings from Last 3 Encounters:  04/20/20 (!) 57  02/10/20 81  01/29/20 67   Wt Readings from Last 3 Encounters:  04/20/20 193 lb (87.5 kg)  02/12/20 190 lb (86.2 kg)  02/10/20 187 lb 13.3 oz (85.2 kg)   BMI Readings from Last 3 Encounters:  04/20/20 30.23 kg/m  02/12/20 29.76 kg/m  02/10/20 29.42 kg/m    Assessment/Interventions: Review of patient past medical history, allergies, medications, health status, including review of consultants reports, laboratory and other test data, was performed as part of comprehensive evaluation and provision of chronic care management services.   SDOH:  (Social Determinants of Health) assessments and interventions performed: Yes SDOH  Interventions   Flowsheet Row Most Recent Value  SDOH Interventions   Financial Strain Interventions Other (Comment)  [working on switching to lower cost effective alternatives]  Transportation Interventions Intervention Not Indicated     SDOH Screenings   Alcohol Screen: Low Risk   . Last Alcohol Screening Score (AUDIT): 0  Depression (PHQ2-9): Low Risk   . PHQ-2 Score: 0  Financial Resource Strain: Medium Risk  . Difficulty of Paying Living Expenses: Somewhat hard  Food Insecurity: No Food Insecurity  . Worried About Charity fundraiser in the Last Year: Never true  . Ran Out of Food in the Last Year: Never true  Housing: Low Risk   . Last Housing Risk Score: 0  Physical Activity: Insufficiently Active  . Days of Exercise per Week: 3 days  . Minutes of Exercise per Session: 30 min  Social Connections: Moderately Isolated  . Frequency of Communication with Friends and Family: More than three times a week  . Frequency of Social Gatherings with Friends and Family: More than three times a week  . Attends Religious Services: 1 to 4 times per year  . Active Member of Clubs or Organizations: No  . Attends Archivist Meetings: Never  . Marital Status: Never married  Stress: No Stress Concern Present  . Feeling of Stress : Not at all  Tobacco Use: Medium Risk  . Smoking Tobacco Use: Former Smoker  . Smokeless Tobacco Use: Never Used  Transportation Needs: No Transportation Needs  . Lack of Transportation (Medical): No  . Lack of Transportation (Non-Medical): No    Patient was having issues with medications in the beginning and now alfluzosin is the only one that costs money. Patient also inquired about getting a refill for nitroglycerin and recommended for him to reach out to cardiologist for refill.  Patient reports he is trying to keep up with his health. He was going to the gym and walking at 5am. His brother is a Biomedical scientist and was making some food for him. He typically  eats macaroni salad, fried chicken every once in a while, not much red meat,  loves asparagus, spinach, collard greens,  lima beans and corn, and loves stir fry. He eats watermelon during the summer and tries to get whole wheat bread. He does admit to drinking regular sodas such as orange crush or pepsi.   CCM Care Plan  Allergies  Allergen Reactions  . Amoxicillin Hives         Medications Reviewed Today    Reviewed by Rip Harbour, [facility] (Certified Podiatric Assistant) on 04/28/20 at [phone]  Med List Status: <None>  Medication Order Taking? Sig Documenting Provider Last Dose Status Informant  acetaminophen (TYLENOL) 325 MG tablet [id number]  Take 650 mg by mouth every 6 (six) hours as needed for mild pain or headache. [provider]  Active Self  alfuzosin (UROXATRAL) 10 MG 24 hr tablet [id number]  Take 10 mg by mouth daily. [provider]  Active   amLODipine (NORVASC) 10 MG tablet [id number]  Take 1 tablet by mouth once daily Nafziger, Tommi Rumps, NP  Active   aspirin 81 MG EC tablet [id number]  Take 1 tablet (81 mg total) by mouth daily. Jessee Avers, MD  Active Self  atorvastatin (LIPITOR) 80 MG tablet [id number]  Take 1 tablet (80 mg total) by mouth daily. Lorretta Harp, MD  Active Self  clopidogrel (PLAVIX) 75 MG tablet [id number]  Take 1 tablet (75 mg total) by mouth daily. Barrett, Evelene Croon, PA-C  Active   metoprolol tartrate (LOPRESSOR) 25 MG tablet [id number]  Take 25 mg by mouth daily. [provider]  Active   nitroGLYCERIN (NITROSTAT) 0.4 MG SL tablet [id number]  Place 1 tablet (0.4 mg total) under the tongue every 5 (five) minutes x 3 doses as needed for chest pain. Lorretta Harp, MD  Active            Med Note Sherryle Lis, [facility] K   Fri Dec 06, 2019  3:10 PM) Has not taken any reccently  pantoprazole (PROTONIX) 40 MG tablet [id number]  Take 1 tablet (40 mg total) by mouth daily. [name], [name], MD  Active           Patient Active Problem List    Diagnosis Date Noted  . CAD S/P percutaneous coronary angioplasty 11/06/2019  . Dyspnea 11/06/2019  .  NSTEMI (non-ST elevated myocardial infarction) ([place]) 10/21/2019  . Acquired trigger finger of left middle finger 08/13/2019  . Pain in finger of left hand 08/13/2019  . Erectile dysfunction 08/09/2016  . Low testosterone in male 08/09/2016  . BPH (benign prostatic hyperplasia) 01/23/2015  . History of glaucoma 10/10/2014  . Vitamin D deficiency 04/18/2014  . GERD (gastroesophageal reflux disease) 06/05/2013  . Prediabetes 06/05/2013  . High risk homosexual behavior 06/05/2013  . Essential hypertension 12/07/2009  . COLONIC POLYPS, ADENOMATOUS 01/05/2006  . Hyperlipidemia 01/05/2006    Immunization History  Administered Date(s) Administered  . Influenza Split 11/21/2011  . Influenza Whole 11/23/2009  . Influenza,inj,Quad PF,6+ Mos 12/06/2018  . Influenza,inj,Quad PF,6-35 Mos 01/29/2016  . Moderna SARS-COV2 Booster Vaccination 12/20/2019  . PFIZER(Purple Top)SARS-COV-2 Vaccination 02/07/2019, 02/28/2019  . Pneumococcal Conjugate-13 07/16/2019  . Zoster Recombinat (Shingrix) 07/16/2019, 10/08/2019    Conditions to be addressed/monitored:  Hypertension, Hyperlipidemia, Coronary Artery Disease, GERD, BPH and Prediabetes  Care Plan : CCM Pharmacy Care Plan  Updates made by Viona Gilmore, [place] since 06/02/2020 12:00 AM    Problem: Problem: Hypertension, Hyperlipidemia, Coronary Artery Disease, GERD, BPH and Prediabetes     Long-Range Goal: Patient-Specific Goal   Start Date: 05/19/2020  Expected End Date: 05/19/2021  This Visit's Progress: On track  Priority: High  Note:   Current Barriers:  . Unable to independently afford treatment regimen . Unable to independently monitor therapeutic efficacy  Pharmacist Clinical Goal(s):  Marland Kitchen Patient will verbalize ability to afford treatment regimen . achieve adherence to monitoring guidelines and medication adherence to achieve  therapeutic efficacy through collaboration with PharmD and provider.   Interventions: . 1:1 collaboration with [name], NP regarding development and update of comprehensive plan of care as evidenced by provider attestation and co-signature . Inter-disciplinary care team collaboration (see longitudinal plan of care) . Comprehensive medication review performed; medication list updated in electronic medical record  Hypertension (BP goal <130/80) -Controlled -Current treatment: . Amlodipine 10 mg 1 tablet daily . Metoprolol tartrate 25 mg 1 tablet daily -Medications previously tried: none  -Current home readings: does not check at home -Current dietary habits: trying to limit salt intake -Current exercise habits: was going to the gym regularly -Reports hypotensive/hypertensive symptoms -Educated on Exercise goal of 150 minutes per week; Importance of home blood pressure monitoring; Proper BP monitoring technique; Symptoms of hypotension and importance of maintaining adequate hydration; -Counseled to monitor BP at home weekly, document, and provide log at future appointments -Counseled on diet and exercise extensively Recommended to continue current medication  Hyperlipidemia: (LDL goal < 70) -Controlled -Current treatment: . Atorvastatin 80 mg 1 tablet daily -Medications previously tried: none  -Current dietary patterns: fried foods every once in a while -Current exercise habits: was going to the gym regularly -Educated on Cholesterol goals;  Benefits of statin for ASCVD risk reduction; Importance of limiting foods high in cholesterol; Exercise goal of 150 minutes per week; -Counseled on diet and exercise extensively Recommended to continue current medication  CAD/NSTEMI (Goal: prevent heart attacks and strokes) -Controlled -Current treatment   Atorvastatin 80 mg 1 tablet daily  Aspirin 81 mg 1 tablet daily  Clopidogrel 75 mg 1 tablet daily  Nitroglycerin 0.4 mg SL  1 tablet as needed -Medications previously tried: none  -Counseled on monitoring for signs of bleeding such as unexplained and excessive bleeding from a cut or injury, easy or excessive bruising, blood in urine or stools, and nosebleeds without a known cause Recommended checking expiration date on  nitroglycerin and refilling   Pre-diabetes (A1c goal <6.5%) -Controlled -Current medications: . No medications -Medications previously tried: none  -Current home glucose readings . fasting glucose: does not need to check . post prandial glucose: does not need to check -Denies hypoglycemic/hyperglycemic symptoms -Current meal patterns:  . breakfast: did not discuss  . lunch: did not discuss   . dinner: did not discuss  . snacks: did not discuss  . drinks: did not discuss  -Current exercise: was going to the gym regularly -Educated on A1c and blood sugar goals; Carbohydrate counting and/or plate method -Counseled to check feet daily and get yearly eye exams -Counseled on diet and exercise extensively  GERD (Goal: minimize symptoms of acid reflux/GERD) -Controlled -Current treatment  . Pantoprazole 40 mg 1 tablet daily -Medications previously tried: none  -Counseled on non-pharmacologic management of symptoms such as elevating the head of your bed, avoiding eating 2-3 hours before bed, avoiding triggering foods such as acidic, spicy, or fatty foods, eating smaller meals, and wearing clothes that are loose around the waist  BPH (Goal: minimize symptoms) -Uncontrolled -Current treatment  . Alfuzosin 10 mg 1 tablet daily -Medications previously tried: unknown  -Counseled on taking consistently to see full benefit. Patient reports medication is expensive and will explore lower cost alternatives.   Health Maintenance -Vaccine gaps: none -Current therapy:  . Acetaminophen 325 mg 1 tablet as needed -Educated on Cost vs benefit of each product must be carefully weighed by individual  consumer -Patient is satisfied with current therapy and denies issues -Recommended to continue current medication  Patient Goals/Self-Care Activities . Patient will:  - focus on medication adherence by switching to adherence packaging check blood pressure weekly, document, and provide at future appointments target a minimum of 150 minutes of moderate intensity exercise weekly  Follow Up Plan: The care management team will reach out to the patient again over the next 30 days.        Medication Assistance: None required.  Patient affirms current coverage meets needs.  Patient's preferred pharmacy is:  [facility] [place], [place] [address] Phone: [phone] Fax: [phone]  [facility] [address] [place] [address] Phone: [phone] Fax: [phone]  [facility] - [place], [place] - [address] [place] [address] Phone: [phone] Fax: [phone]  Uses pill box? Yes - not using consistently (was lazy) Pt endorses 99% compliance  We discussed: Benefits of medication synchronization, packaging and delivery as well as enhanced pharmacist oversight with [facility]. Patient decided to: Utilize [facility] pharmacy for medication synchronization, packaging and delivery  Care Plan and Follow Up Patient Decision:  Patient agrees to Care Plan and Follow-up.  Plan: The care management team will reach out to the patient again over the next 30 days.  Jeni Salles, PharmD [facility] Clinical Pharmacist [place] [facility] [place]

## 2020-05-25 ENCOUNTER — Telehealth: Payer: Self-pay | Admitting: Cardiovascular Disease

## 2020-05-25 ENCOUNTER — Other Ambulatory Visit: Payer: Self-pay | Admitting: Cardiovascular Disease

## 2020-05-25 NOTE — Telephone Encounter (Signed)
 *  STAT* If patient is at the pharmacy, call can be transferred to refill team.   1. Which medications need to be refilled? (please list name of each medication and dose if known)   nitroGLYCERIN (NITROSTAT) 0.4 MG SL tablet  2. Which pharmacy/location (including street and city if local pharmacy) is medication to be sent to?  [place], [address]  3. Do they need a 30 day or 90 day supply? 30 day

## 2020-05-26 ENCOUNTER — Ambulatory Visit: Payer: Medicare HMO | Admitting: Podiatry

## 2020-05-27 ENCOUNTER — Other Ambulatory Visit: Payer: Self-pay

## 2020-05-27 ENCOUNTER — Other Ambulatory Visit: Payer: Self-pay | Admitting: Adult Health

## 2020-05-27 DIAGNOSIS — R0789 Other chest pain: Secondary | ICD-10-CM

## 2020-05-27 MED ORDER — AMLODIPINE BESYLATE 10 MG PO TABS: 1.0000 | ORAL_TABLET | Freq: Every day | ORAL | 0 refills | Status: DC

## 2020-05-27 NOTE — Telephone Encounter (Signed)
Noted [facility]. Previous Rx canceled from [facility]

## 2020-05-27 NOTE — Telephone Encounter (Signed)
-----   Message from Viona Gilmore, [facility] sent at 05/27/2020  6:56 AM EDT ----- Regarding: Amlodipine refill Hi,  Mr. Gear is switching to [facility] pharmacy. Can you please send a refill of amlodipine there? He is almost out of the medication.  Thank you, Maddie

## 2020-05-28 ENCOUNTER — Telehealth: Payer: Self-pay | Admitting: *Deleted

## 2020-05-28 DIAGNOSIS — R933 Abnormal findings on diagnostic imaging of other parts of digestive tract: Secondary | ICD-10-CM

## 2020-05-28 DIAGNOSIS — K219 Gastro-esophageal reflux disease without esophagitis: Secondary | ICD-10-CM

## 2020-05-28 MED ORDER — PANTOPRAZOLE SODIUM 40 MG PO TBEC: 40.0000 mg | DELAYED_RELEASE_TABLET | Freq: Every day | ORAL | 3 refills | Status: DC

## 2020-05-28 NOTE — Telephone Encounter (Signed)
-----   Message from Jerene Bears, MD sent at 05/28/2020  2:57 PM EDT ----- Regarding: FW: New Pharmacy Can you help here Thanks JMP  ----- Message ----- From: Dondra Prader Sent: 05/28/2020   2:14 PM EDT To: Jerene Bears, MD Subject: [place]  Afternoon  This patient is switching his services to [facility]. Can you please send the following prescriptions?   Pantoprazole 40 mg tablet   If the provider does not write this medication, please let me know.   Have a wonderful day.  Maia Breslow, [place] Pharmacist Assistant [phone]   [facility] - [place], [place] - [address], [place] [address] Phone:  [phone] Fax:  [phone]

## 2020-05-28 NOTE — Telephone Encounter (Signed)
Rx sent to [facility]. 

## 2020-06-02 NOTE — Patient Instructions (Addendum)
Hi Limuel,  It was great to get to meet you in person! Below is a summary of some of the topics we discussed.   Please reach out to me if you have any questions or need anything before our follow up!  Best, Maddie  Jeni Salles, PharmD, [facility] [place] [phone]  Visit Information  Goals Addressed            This Visit's Progress   . Manage My Medicine       Timeframe:  Short-Term Goal Priority:  High Start Date:                             Expected End Date:                       Follow Up Date 8//30/2022    - call for medicine refill 2 or 3 days before it runs out - use a pillbox to sort medicine    Why is this important?   . These steps will help you keep on track with your medicines.   Notes:       Patient Care Plan: CCM Pharmacy Care Plan    Problem Identified: Problem: Hypertension, Hyperlipidemia, Coronary Artery Disease, GERD, BPH and Prediabetes     Long-Range Goal: Patient-Specific Goal   Start Date: 05/19/2020  Expected End Date: 05/19/2021  This Visit's Progress: On track  Priority: High  Note:   Current Barriers:  . Unable to independently afford treatment regimen . Unable to independently monitor therapeutic efficacy  Pharmacist Clinical Goal(s):  Marland Kitchen Patient will verbalize ability to afford treatment regimen . achieve adherence to monitoring guidelines and medication adherence to achieve therapeutic efficacy through collaboration with PharmD and provider.   Interventions: . 1:1 collaboration with [name], NP regarding development and update of comprehensive plan of care as evidenced by provider attestation and co-signature . Inter-disciplinary care team collaboration (see longitudinal plan of care) . Comprehensive medication review performed; medication list updated in electronic medical record  Hypertension (BP goal <130/80) -Controlled -Current treatment: . Amlodipine 10 mg 1 tablet  daily . Metoprolol tartrate 25 mg 1 tablet daily -Medications previously tried: none  -Current home readings: does not check at home -Current dietary habits: trying to limit salt intake -Current exercise habits: was going to the gym regularly -Reports hypotensive/hypertensive symptoms -Educated on Exercise goal of 150 minutes per week; Importance of home blood pressure monitoring; Proper BP monitoring technique; Symptoms of hypotension and importance of maintaining adequate hydration; -Counseled to monitor BP at home weekly, document, and provide log at future appointments -Counseled on diet and exercise extensively Recommended to continue current medication  Hyperlipidemia: (LDL goal < 70) -Controlled -Current treatment: . Atorvastatin 80 mg 1 tablet daily -Medications previously tried: none  -Current dietary patterns: fried foods every once in a while -Current exercise habits: was going to the gym regularly -Educated on Cholesterol goals;  Benefits of statin for ASCVD risk reduction; Importance of limiting foods high in cholesterol; Exercise goal of 150 minutes per week; -Counseled on diet and exercise extensively Recommended to continue current medication  CAD/NSTEMI (Goal: prevent heart attacks and strokes) -Controlled -Current treatment   Atorvastatin 80 mg 1 tablet daily  Aspirin 81 mg 1 tablet daily  Clopidogrel 75 mg 1 tablet daily  Nitroglycerin 0.4 mg SL 1 tablet as needed -Medications previously tried: none  -Counseled on monitoring for signs of  bleeding such as unexplained and excessive bleeding from a cut or injury, easy or excessive bruising, blood in urine or stools, and nosebleeds without a known cause Recommended checking expiration date on nitroglycerin and refilling   Pre-diabetes (A1c goal <6.5%) -Controlled -Current medications: . No medications -Medications previously tried: none  -Current home glucose readings . fasting glucose: does not need  to check . post prandial glucose: does not need to check -Denies hypoglycemic/hyperglycemic symptoms -Current meal patterns:  . breakfast: did not discuss  . lunch: did not discuss   . dinner: did not discuss  . snacks: did not discuss  . drinks: did not discuss  -Current exercise: was going to the gym regularly -Educated on A1c and blood sugar goals; Carbohydrate counting and/or plate method -Counseled to check feet daily and get yearly eye exams -Counseled on diet and exercise extensively  GERD (Goal: minimize symptoms of acid reflux/GERD) -Controlled -Current treatment  . Pantoprazole 40 mg 1 tablet daily -Medications previously tried: none  -Counseled on non-pharmacologic management of symptoms such as elevating the head of your bed, avoiding eating 2-3 hours before bed, avoiding triggering foods such as acidic, spicy, or fatty foods, eating smaller meals, and wearing clothes that are loose around the waist  BPH (Goal: minimize symptoms) -Uncontrolled -Current treatment  . Alfuzosin 10 mg 1 tablet daily -Medications previously tried: unknown  -Counseled on taking consistently to see full benefit. Patient reports medication is expensive and will explore lower cost alternatives.   Health Maintenance -Vaccine gaps: none -Current therapy:  . Acetaminophen 325 mg 1 tablet as needed -Educated on Cost vs benefit of each product must be carefully weighed by individual consumer -Patient is satisfied with current therapy and denies issues -Recommended to continue current medication  Patient Goals/Self-Care Activities . Patient will:  - focus on medication adherence by switching to adherence packaging check blood pressure weekly, document, and provide at future appointments target a minimum of 150 minutes of moderate intensity exercise weekly  Follow Up Plan: The care management team will reach out to the patient again over the next 30 days.       Mr. Ballow was given  information about Chronic Care Management services today including:  1. CCM service includes personalized support from designated clinical staff supervised by his physician, including individualized plan of care and coordination with other care providers 2. 24/7 contact phone numbers for assistance for urgent and routine care needs. 3. Standard insurance, coinsurance, copays and deductibles apply for chronic care management only during months in which we provide at least 20 minutes of these services. Most insurances cover these services at 100%, however patients may be responsible for any copay, coinsurance and/or deductible if applicable. This service may help you avoid the need for more expensive face-to-face services. 4. Only one practitioner may furnish and bill the service in a calendar month. 5. The patient may stop CCM services at any time (effective at the end of the month) by phone call to the office staff.  Patient agreed to services and verbal consent obtained.   The patient verbalized understanding of instructions, educational materials, and care plan provided today and agreed to receive a mailed copy of patient instructions, educational materials, and care plan.  The pharmacy team will reach out to the patient again over the next 30 days.   Viona Gilmore, [facility]

## 2020-06-04 ENCOUNTER — Other Ambulatory Visit: Payer: Self-pay | Admitting: *Deleted

## 2020-06-04 MED ORDER — METOPROLOL TARTRATE 25 MG PO TABS: 25.0000 mg | ORAL_TABLET | Freq: Every day | ORAL | 1 refills | Status: DC

## 2020-06-04 MED ORDER — CLOPIDOGREL BISULFATE 75 MG PO TABS: 75.0000 mg | ORAL_TABLET | Freq: Every day | ORAL | 1 refills | Status: DC

## 2020-06-04 MED ORDER — ATORVASTATIN CALCIUM 80 MG PO TABS
80.0000 mg | ORAL_TABLET | Freq: Every day | ORAL | 1 refills | Status: DC
Start: 2020-06-04 — End: 2020-12-10
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
## 2020-06-08 ENCOUNTER — Other Ambulatory Visit: Payer: Self-pay

## 2020-06-08 ENCOUNTER — Ambulatory Visit (INDEPENDENT_AMBULATORY_CARE_PROVIDER_SITE_OTHER): Payer: Medicare HMO | Admitting: Interventional Cardiology

## 2020-06-08 ENCOUNTER — Encounter: Payer: Self-pay | Admitting: Interventional Cardiology

## 2020-06-08 VITALS — BP 120/70 | HR 72 | Ht 67.0 in | Wt 193.0 lb

## 2020-06-08 DIAGNOSIS — R0600 Dyspnea, unspecified: Secondary | ICD-10-CM

## 2020-06-08 DIAGNOSIS — G4719 Other hypersomnia: Secondary | ICD-10-CM

## 2020-06-08 DIAGNOSIS — I10 Essential (primary) hypertension: Secondary | ICD-10-CM

## 2020-06-08 DIAGNOSIS — R7303 Prediabetes: Secondary | ICD-10-CM | POA: Diagnosis not present

## 2020-06-08 DIAGNOSIS — E785 Hyperlipidemia, unspecified: Secondary | ICD-10-CM

## 2020-06-08 DIAGNOSIS — I2510 Atherosclerotic heart disease of native coronary artery without angina pectoris: Secondary | ICD-10-CM

## 2020-06-08 DIAGNOSIS — R0683 Snoring: Secondary | ICD-10-CM | POA: Diagnosis not present

## 2020-06-08 DIAGNOSIS — I214 Non-ST elevation (NSTEMI) myocardial infarction: Secondary | ICD-10-CM

## 2020-06-08 MED ORDER — METOPROLOL SUCCINATE ER 25 MG PO TB24: 25.0000 mg | ORAL_TABLET | Freq: Every day | ORAL | 3 refills | Status: DC

## 2020-06-08 NOTE — Patient Instructions (Signed)
Medication Instructions:  1) DISCONTINUE Metoprolol Tartrate 2) START Metoprolol Succinate 25mg  once daily  *If you need a refill on your cardiac medications before your next appointment, please call your pharmacy*   Lab Work: None If you have labs (blood work) drawn today and your tests are completely normal, you will receive your results only by: Marland Kitchen MyChart Message (if you have MyChart) OR . A paper copy in the mail If you have any lab test that is abnormal or we need to change your treatment, we will call you to review the results.   Testing/Procedures: Your physician has recommended that you have a sleep study. This test records several body functions during sleep, including: brain activity, eye movement, oxygen and carbon dioxide blood levels, heart rate and rhythm, breathing rate and rhythm, the flow of air through your mouth and nose, snoring, body muscle movements, and chest and belly movement.    Follow-Up: At [facility], you and your health needs are our priority.  As part of our continuing mission to provide you with exceptional heart care, we have created designated Provider Care Teams.  These Care Teams include your primary Cardiologist (physician) and Advanced Practice Providers (APPs -  Physician Assistants and Nurse Practitioners) who all work together to provide you with the care you need, when you need it.  We recommend signing up for the patient portal called "MyChart".  Sign up information is provided on this After Visit Summary.  MyChart is used to connect with patients for Virtual Visits (Telemedicine).  Patients are able to view lab/test results, encounter notes, upcoming appointments, etc.  Non-urgent messages can be sent to your provider as well.   To learn more about what you can do with MyChart, go to [email or web address].    Your next appointment:   6 month(s)  The format for your next appointment:   In Person  Provider:   You may see Sinclair Grooms, MD or one of the following Advanced Practice Providers on your designated Care Team:    Kathyrn Drown, NP    Other Instructions

## 2020-06-09 ENCOUNTER — Telehealth: Payer: Self-pay | Admitting: *Deleted

## 2020-06-09 NOTE — Telephone Encounter (Signed)
-----   Message from Loren Racer, RN sent at 06/08/2020  1:48 PM EDT ----- Sleep study ordered

## 2020-06-11 ENCOUNTER — Other Ambulatory Visit: Payer: Self-pay | Admitting: *Deleted

## 2020-06-11 NOTE — Progress Notes (Signed)
This encounter was created in error - please disregard.

## 2020-06-17 ENCOUNTER — Telehealth: Payer: Self-pay | Admitting: *Deleted

## 2020-06-17 ENCOUNTER — Other Ambulatory Visit: Payer: Self-pay | Admitting: Interventional Cardiology

## 2020-06-17 DIAGNOSIS — R400 Somnolence: Secondary | ICD-10-CM

## 2020-06-17 DIAGNOSIS — R0683 Snoring: Secondary | ICD-10-CM

## 2020-06-17 NOTE — Telephone Encounter (Signed)
Per [employer] web portal no PA is required for HST. Denied In lab study. No co morbidities. [facility] discount does not require a PA.

## 2020-06-17 NOTE — Telephone Encounter (Signed)
-----   Message from Loren Racer, RN sent at 06/08/2020  1:48 PM EDT ----- Sleep study ordered

## 2020-06-19 NOTE — Telephone Encounter (Signed)
Informed patient of upcoming home sleep study and patient understanding was verbalized.  Patient understands his HST is scheduled for 07/31/20 at 12. Pt is aware of testing date.

## 2020-06-19 NOTE — Telephone Encounter (Signed)
Informed patient of upcoming home sleep study and patient understanding was verbalized.  Patient understands her/his HST is scheduled for 07/31/20 at 12. Pt is aware of testing date.

## 2020-07-15 ENCOUNTER — Other Ambulatory Visit: Payer: Self-pay

## 2020-07-16 ENCOUNTER — Ambulatory Visit (INDEPENDENT_AMBULATORY_CARE_PROVIDER_SITE_OTHER): Payer: Medicare HMO | Admitting: Adult Health

## 2020-07-16 ENCOUNTER — Encounter: Payer: Self-pay | Admitting: Adult Health

## 2020-07-16 VITALS — BP 130/82 | HR 67 | Temp 97.9°F | Ht 67.75 in | Wt 196.0 lb

## 2020-07-16 DIAGNOSIS — Z0000 Encounter for general adult medical examination without abnormal findings: Secondary | ICD-10-CM | POA: Diagnosis not present

## 2020-07-16 DIAGNOSIS — E785 Hyperlipidemia, unspecified: Secondary | ICD-10-CM | POA: Diagnosis not present

## 2020-07-16 DIAGNOSIS — I10 Essential (primary) hypertension: Secondary | ICD-10-CM

## 2020-07-16 DIAGNOSIS — R7303 Prediabetes: Secondary | ICD-10-CM | POA: Diagnosis not present

## 2020-07-16 DIAGNOSIS — K219 Gastro-esophageal reflux disease without esophagitis: Secondary | ICD-10-CM

## 2020-07-16 DIAGNOSIS — I2510 Atherosclerotic heart disease of native coronary artery without angina pectoris: Secondary | ICD-10-CM | POA: Diagnosis not present

## 2020-07-16 DIAGNOSIS — Z23 Encounter for immunization: Secondary | ICD-10-CM

## 2020-07-16 DIAGNOSIS — Z9861 Coronary angioplasty status: Secondary | ICD-10-CM

## 2020-07-16 DIAGNOSIS — N400 Enlarged prostate without lower urinary tract symptoms: Secondary | ICD-10-CM | POA: Diagnosis not present

## 2020-07-16 LAB — CBC WITH DIFFERENTIAL/PLATELET
Basophils Absolute: 0 10*3/uL (ref 0.0–0.1)
Basophils Relative: 0.6 % (ref 0.0–3.0)
Eosinophils Absolute: 0.1 10*3/uL (ref 0.0–0.7)
Eosinophils Relative: 2.3 % (ref 0.0–5.0)
HCT: 43.7 % (ref 39.0–52.0)
Hemoglobin: 15 g/dL (ref 13.0–17.0)
Lymphocytes Relative: 39.9 % (ref 12.0–46.0)
Lymphs Abs: 2.2 10*3/uL (ref 0.7–4.0)
MCHC: 34.2 g/dL (ref 30.0–36.0)
MCV: 84.9 fl (ref 78.0–100.0)
Monocytes Absolute: 0.4 10*3/uL (ref 0.1–1.0)
Monocytes Relative: 6.3 % (ref 3.0–12.0)
Neutro Abs: 2.8 10*3/uL (ref 1.4–7.7)
Neutrophils Relative %: 50.9 % (ref 43.0–77.0)
Platelets: 174 10*3/uL (ref 150.0–400.0)
RBC: 5.15 Mil/uL (ref 4.22–5.81)
RDW: 14.4 % (ref 11.5–15.5)
WBC: 5.6 10*3/uL (ref 4.0–10.5)

## 2020-07-16 LAB — LIPID PANEL
Cholesterol: 119 mg/dL (ref 0–200)
HDL: 38.9 mg/dL — ABNORMAL LOW (ref >39.00)
LDL Cholesterol: 69 mg/dL (ref 0–99)
NonHDL: 79.73
Total CHOL/HDL Ratio: 3
Triglycerides: 56 mg/dL (ref 0.0–149.0)
VLDL: 11.2 mg/dL (ref 0.0–40.0)

## 2020-07-16 LAB — COMPREHENSIVE METABOLIC PANEL
ALT: 26 U/L (ref 0–53)
AST: 19 U/L (ref 0–37)
Albumin: 4.6 g/dL (ref 3.5–5.2)
Alkaline Phosphatase: 120 U/L — ABNORMAL HIGH (ref 39–117)
BUN: 12 mg/dL (ref 6–23)
CO2: 30 mEq/L (ref 19–32)
Calcium: 10.1 mg/dL (ref 8.4–10.5)
Chloride: 105 mEq/L (ref 96–112)
Creatinine, Ser: 1.19 mg/dL (ref 0.40–1.50)
GFR: 63.15 mL/min (ref >60.00)
Glucose, Bld: 103 mg/dL — ABNORMAL HIGH (ref 70–99)
Potassium: 4.3 mEq/L (ref 3.5–5.1)
Sodium: 142 mEq/L (ref 135–145)
Total Bilirubin: 0.9 mg/dL (ref 0.2–1.2)
Total Protein: 6.9 g/dL (ref 6.0–8.3)

## 2020-07-16 LAB — PSA: PSA: 0.83 ng/mL (ref 0.10–4.00)

## 2020-07-16 LAB — TSH: TSH: 1.55 u[IU]/mL (ref 0.35–5.50)

## 2020-07-16 LAB — HEMOGLOBIN A1C: Hgb A1c MFr Bld: 6.5 % (ref 4.6–6.5)
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
## 2020-07-16 NOTE — Patient Instructions (Addendum)
It was great seeing you today   We will follow up with you regarding your blood work   Please get back into the gym and start exercising.

## 2020-07-17 ENCOUNTER — Telehealth: Payer: Self-pay | Admitting: Adult Health

## 2020-07-17 MED ORDER — METFORMIN HCL ER 500 MG PO TB24: 500.0000 mg | ORAL_TABLET | Freq: Every day | ORAL | 0 refills | Status: DC

## 2020-07-17 NOTE — Telephone Encounter (Signed)
Updated patient on his labs. A1c 6.5 - will start him on metformin 500 mg daily. Needs extensive lifestyle modification. Follow up in 3 months

## 2020-07-21 ENCOUNTER — Telehealth: Payer: Self-pay | Admitting: Pharmacist

## 2020-07-21 NOTE — Chronic Care Management (AMB) (Signed)
Chronic Care Management Pharmacy Assistant   Name: Dylan Lane  MRN: [id number] DOB: [birth year]   Reason for Encounter: Medication Review/ Medication Coordination Call.    Recent office visits:  07/16/20 [name] (PCP) - seen for routine general medical exam and other chronic conditions. Discontinued acetaminophen. Will follow up regarding bloodwork.  Recent consult visits:  06/08/20 Belva Crome MD (Cardiology) - seen for CAD and other chronic conditions. Patient switched to metoprolol succinate from metoprolol tartrate. Order to split sleep night study placed. Follow up in 6 months.   Hospital visits:  None in previous 6 months  Medications: Outpatient Encounter Medications as of 07/21/2020  Medication Sig   alfuzosin (UROXATRAL) 10 MG 24 hr tablet Take 10 mg by mouth daily.   amLODipine (NORVASC) 10 MG tablet Take 1 tablet (10 mg total) by mouth daily.   aspirin 81 MG EC tablet Take 1 tablet (81 mg total) by mouth daily.   atorvastatin (LIPITOR) 80 MG tablet Take 1 tablet (80 mg total) by mouth daily.   clopidogrel (PLAVIX) 75 MG tablet Take 1 tablet (75 mg total) by mouth daily.   metFORMIN (GLUCOPHAGE-XR) 500 MG 24 hr tablet Take 1 tablet (500 mg total) by mouth daily with breakfast.   metoprolol succinate (TOPROL XL) 25 MG 24 hr tablet Take 1 tablet (25 mg total) by mouth daily.   nitroGLYCERIN (NITROSTAT) 0.4 MG SL tablet DISSOLVE ONE TABLET UNDER THE TONGUE EVERY 5 MINUTES AS NEEDED FOR CHEST PAIN.  DO NOT EXCEED A TOTAL OF 3 DOSES IN 15 MINUTES (Patient not taking: Reported on 07/16/2020)   pantoprazole (PROTONIX) 40 MG tablet Take 1 tablet (40 mg total) by mouth daily.   No facility-administered encounter medications on file as of 07/21/2020.   Fill history: alfuzosin ER 10 mg tablet,extended release 24 hr 07/06/2020 30   atorvastatin 80 mg tablet 07/06/2020 20   CLOPIDOGREL 75MG     TAB 04/29/2020 90   METOPROLOL ER 25MG   TAB 06/08/2020 90    NITROGLYCER 0.4MG    SUB 06/09/2020 5   pantoprazole 40 mg tablet,delayed release 06/23/2020 34   amlodipine 10 mg tablet 06/01/2020 60   METFORMIN ER 500MG   TAB 07/17/2020 90    Reviewed chart for medication changes ahead of medication coordination call.  No OVs, Consults, or hospital visits since last care coordination call/Pharmacist visit. (If appropriate, list visit date, provider name)  No medication changes indicated OR if recent visit, treatment plan here.  BP Readings from Last 3 Encounters:  07/16/20 130/82  06/08/20 120/70  04/20/20 110/64    Lab Results  Component Value Date   HGBA1C 6.5 07/16/2020     Patient obtains medications through [facility]  30 Days   Last adherence delivery included: patient newly onboarded to [facility].  Patient is due for next adherence delivery on: 07/29/20. Called patient and reviewed medications and coordinated delivery.  This delivery to include: Amlodipine 10mg  - one tablet with breakfast Pantoprazole 40mg  - one tablet with breakfast Atorvastatin 80mg  - one tablet with breakfast Clopidogrel 75mg  - one tablet daily with breakfast   Patient declined the following medication due to Metoprolol tartrate 25mg  - daily with breakfast  Confirmed delivery date of 07/29/20, advised patient that pharmacy will contact them the morning of delivery.   Care Gaps:  AWV -  completed on 04/20/20. COVID-19 - booster 4 overdue since 04/19/20  Star Rating Drugs:  Atorvastatin 80mg  - last filled on 07/06/20 20DS at [facility] Metformin 500mg   -  last filled on 07/17/20 90DS at [facility] Pharmacist Assistant [phone]

## 2020-07-23 DIAGNOSIS — Z6830 Body mass index (BMI) 30.0-30.9, adult: Secondary | ICD-10-CM | POA: Diagnosis not present

## 2020-07-23 DIAGNOSIS — I10 Essential (primary) hypertension: Secondary | ICD-10-CM | POA: Diagnosis not present

## 2020-07-23 DIAGNOSIS — N400 Enlarged prostate without lower urinary tract symptoms: Secondary | ICD-10-CM | POA: Diagnosis not present

## 2020-07-23 DIAGNOSIS — E669 Obesity, unspecified: Secondary | ICD-10-CM | POA: Diagnosis not present

## 2020-07-23 DIAGNOSIS — I25119 Atherosclerotic heart disease of native coronary artery with unspecified angina pectoris: Secondary | ICD-10-CM | POA: Diagnosis not present

## 2020-07-23 DIAGNOSIS — E785 Hyperlipidemia, unspecified: Secondary | ICD-10-CM | POA: Diagnosis not present

## 2020-07-23 DIAGNOSIS — Z7722 Contact with and (suspected) exposure to environmental tobacco smoke (acute) (chronic): Secondary | ICD-10-CM | POA: Diagnosis not present

## 2020-07-23 DIAGNOSIS — E119 Type 2 diabetes mellitus without complications: Secondary | ICD-10-CM | POA: Diagnosis not present

## 2020-07-23 DIAGNOSIS — I252 Old myocardial infarction: Secondary | ICD-10-CM | POA: Diagnosis not present

## 2020-07-23 DIAGNOSIS — K219 Gastro-esophageal reflux disease without esophagitis: Secondary | ICD-10-CM | POA: Diagnosis not present

## 2020-07-28 ENCOUNTER — Telehealth (INDEPENDENT_AMBULATORY_CARE_PROVIDER_SITE_OTHER): Payer: Medicare HMO | Admitting: Family Medicine

## 2020-07-28 ENCOUNTER — Encounter: Payer: Self-pay | Admitting: Family Medicine

## 2020-07-28 VITALS — Ht 67.75 in

## 2020-07-28 DIAGNOSIS — U071 COVID-19: Secondary | ICD-10-CM | POA: Diagnosis not present

## 2020-07-28 DIAGNOSIS — R112 Nausea with vomiting, unspecified: Secondary | ICD-10-CM | POA: Diagnosis not present

## 2020-07-28 MED ORDER — ONDANSETRON HCL 4 MG PO TABS: 4.0000 mg | ORAL_TABLET | Freq: Three times a day (TID) | ORAL | 0 refills | Status: AC | PRN

## 2020-07-28 MED ORDER — MOLNUPIRAVIR EUA 200MG CAPSULE: 4.0000 | ORAL_CAPSULE | Freq: Two times a day (BID) | ORAL | 0 refills | Status: AC

## 2020-07-28 NOTE — Progress Notes (Signed)
Virtual Visit via Telephone Note I connected with [name] on 07/28/20 at  3:30 PM EDT by telephone and verified that I am speaking with the correct person using two identifiers. We used caregility but could not get the video piece to work.  I discussed the limitations, risks, security and privacy concerns of performing an evaluation and management service by telephone and the availability of in person appointments. I also discussed with the patient that there may be a patient responsible charge related to this service. The patient expressed understanding and agreed to proceed.  Location patient: home Location provider: work office Participants present for the call: patient, provider Patient did not have a visit in the prior 7 days to address this/these issue(s).  Chief Complaint  Patient presents with   Covid Positive  History of Present Illness: Mr. Dasinger is a 70 yo male with hx of HTN,CAD,HLD,and prediabetes c/o 2 days of respiratory symptoms. Subjective fever,chills,nasal congestion, sore throat,rhinorrhea,productive cough, intermittent abdominal cramps,diarrhea,nausea,vomiting,body aches, and fatigue. Diarrhea x 2 today. Vomiting x 2 yesterday. Decreased appetite. Cough is mild and not interfering with sleep.  Negative for anosmia,ageusia,wheezing,SOB,blood in stool, urinary symptoms,or skin rash. He is taking Tylenol.  Home COVID 19 test today. No known sick contacts or recent travel. COVID-19 vaccination completed + booster x2.   Observations/Objective: Patient sounds cheerful and well on the phone. I do not appreciate any SOB,cough,or wheezing. Speech and thought processing are grossly intact. Patient reported vitals:Ht 5' 7.75" (1.721 m)   BMI 30.02 kg/m   Assessment and Plan: 1. COVID-19 virus infection We discussed Dx, possible complications,and treatment options. Mild to moderate disease with some risk factors for complications. Symptomatic treatment:  Increase fluid intake, continue Tylenol 500 mg 3-4 times per day prn,rest,and throat lozenges for sore throat. Instructed to monitor temperature. 7 days of quarantine starting from symptoms onset. Molnupiravir recommended. Side effects reviewed. Instructed about warning signs.  - molnupiravir EUA 200 mg CAPS; Take 4 capsules (800 mg total) by mouth 2 (two) times daily for 5 days.  Dispense: 40 capsule; Refill: 0  2. Nausea and vomiting in adult Adequate hydration, small and frequent sips of clear fluids. Zofran 4 mg tid prn.  - ondansetron (ZOFRAN) 4 MG tablet; Take 1 tablet (4 mg total) by mouth every 8 (eight) hours as needed for up to 3 days for nausea or vomiting.  Dispense: 9 tablet; Refill: 0  Follow Up Instructions:  Return if symptoms worsen or fail to improve.  I did not refer this patient for an OV in the next 24 hours for this/these issue(s).  I discussed the assessment and treatment plan with the patient. Mr. Fagot was provided an opportunity to ask questions and all were answered. He agreed with the plan and demonstrated an understanding of the instructions.   The patient was advised to call back or seek an in-person evaluation if the symptoms worsen or if the condition fails to improve as anticipated.  I provided 12 minutes of non-face-to-face time during this encounter.  Betty Martinique, MD

## 2020-07-31 ENCOUNTER — Encounter (HOSPITAL_BASED_OUTPATIENT_CLINIC_OR_DEPARTMENT_OTHER): Payer: Medicare HMO | Admitting: Cardiovascular Disease

## 2020-07-31 ENCOUNTER — Telehealth: Payer: Self-pay | Admitting: Adult Health

## 2020-07-31 NOTE — Telephone Encounter (Signed)
Patient called asking if he could speak to [facility].  Patient's contact number is [phone].  Please advise.

## 2020-07-31 NOTE — Telephone Encounter (Signed)
Patient would not disclose what he wanted to speak to [facility] about.

## 2020-08-04 ENCOUNTER — Telehealth: Payer: Self-pay | Admitting: Interventional Cardiology

## 2020-08-04 NOTE — Telephone Encounter (Signed)
Reached out to the patient and explained he will go to the sleep disorders center at [facility] to pick up his home sleep test and meet with the technician at [place]. Pt is agreeable to treatment.

## 2020-08-04 NOTE — Telephone Encounter (Signed)
Patient states yesterday, 08/03/20, he started having spasms underneath his left arm pit. He states today he started having the spasms again. Denies pain under arm.  Denies CP/tightness. He would like like know what this means and whether or not he should be concerned. Please advise.

## 2020-08-04 NOTE — Telephone Encounter (Signed)
Reached out to the patient and explained he will go to the sleep disorders center at [facility] to pick up his home sleep test and meet with the technician at [place]. Pt is agreeable to treatment.

## 2020-08-04 NOTE — Telephone Encounter (Signed)
Yesterday and today pt has noticed some spasms/twitching in the left axillary area.  Only lasts a few seconds.  Feeling improves when he applies pressure to the area with his right hand.  Denies CP, SOB, lightheadedness, dizziness or other issues when this occurs.  Just suddenly comes on.  Not related to any particular kind of movement.  Denies any recent pushing, pulling, lifting or straining.  Advised pt this does not sound cardiac related but if it continues to contact PCP.  Pt agreeable.  While on the phone, pt mentioned that he is scheduled for an in home sleep study on 8/8.  He asked about where he is suppose to go to pick up the sleep study device.  Advised I am unsure so I will send a message to the sleep CMA, Gae Bon and have her follow up with pt.  Pt appreciative for assistance.

## 2020-08-04 NOTE — Telephone Encounter (Signed)
Pt stated that he had some spasms under his armpit. Pt stated that he spoke with the Cardiologist regarding the matter but was told to call his PCP. Pt advised to call back to make an appt. Due to computer issues. Pt verbalized understanding.

## 2020-08-10 ENCOUNTER — Ambulatory Visit (HOSPITAL_BASED_OUTPATIENT_CLINIC_OR_DEPARTMENT_OTHER): Payer: Medicare HMO | Attending: Interventional Cardiology | Admitting: Cardiovascular Disease

## 2020-08-10 ENCOUNTER — Other Ambulatory Visit: Payer: Self-pay

## 2020-08-12 ENCOUNTER — Telehealth: Payer: Self-pay | Admitting: Adult Health

## 2020-08-12 NOTE — Telephone Encounter (Signed)
Pt call and stated he want a call back and he didn't want to tell me what is going on with him.

## 2020-08-12 NOTE — Telephone Encounter (Signed)
Spoke to pt and he stated no one has called him about his results and he is concerned about cholesterol. I advised pt that I will route the message to [facility]. Pt verbalized understanding.

## 2020-08-13 NOTE — Telephone Encounter (Signed)
Noted  

## 2020-08-13 NOTE — Telephone Encounter (Signed)
Pt stated that the reason why he was concerned was because he has some pressure in his abdomen as well as his back. Pt articulated that he has also developed a cough which makes the pressure sensation worse. Pt denies pain and fever.  We will try to find an appt. For pt. Pt notified of update and verbalized understanding.

## 2020-08-13 NOTE — Telephone Encounter (Signed)
Spoke to pt and notified of update.

## 2020-08-18 ENCOUNTER — Other Ambulatory Visit: Payer: Self-pay | Admitting: Adult Health

## 2020-08-18 ENCOUNTER — Telehealth: Payer: Self-pay | Admitting: Pharmacist

## 2020-08-18 DIAGNOSIS — R0789 Other chest pain: Secondary | ICD-10-CM

## 2020-08-18 NOTE — Chronic Care Management (AMB) (Signed)
Chronic Care Management Pharmacy Assistant   Name: Dylan Lane  MRN: [id number] DOB: Sep 30, 1950  Reason for Encounter: Medication Review/ Medication Coordination Call.   Recent office visits:  07/28/20 Betty Martinique MD (PCP) - video visit for Covid-19 virus infection. Patient started on Molnupiravir '800mg'$  2 times daily and Ondansetron '4mg'$  every 8 hours as needed. Follow up as needed.   Recent consult visits:  None.  Hospital visits:  None in previous 6 months  Medications: Outpatient Encounter Medications as of 08/18/2020  Medication Sig   alfuzosin (UROXATRAL) 10 MG 24 hr tablet Take 10 mg by mouth daily.   amLODipine (NORVASC) 10 MG tablet Take 1 tablet (10 mg total) by mouth daily.   aspirin 81 MG EC tablet Take 1 tablet (81 mg total) by mouth daily.   atorvastatin (LIPITOR) 80 MG tablet Take 1 tablet (80 mg total) by mouth daily.   clopidogrel (PLAVIX) 75 MG tablet Take 1 tablet (75 mg total) by mouth daily.   metFORMIN (GLUCOPHAGE-XR) 500 MG 24 hr tablet Take 1 tablet (500 mg total) by mouth daily with breakfast.   metoprolol succinate (TOPROL XL) 25 MG 24 hr tablet Take 1 tablet (25 mg total) by mouth daily.   nitroGLYCERIN (NITROSTAT) 0.4 MG SL tablet DISSOLVE ONE TABLET UNDER THE TONGUE EVERY 5 MINUTES AS NEEDED FOR CHEST PAIN.  DO NOT EXCEED A TOTAL OF 3 DOSES IN 15 MINUTES (Patient not taking: No sig reported)   pantoprazole (PROTONIX) 40 MG tablet Take 1 tablet (40 mg total) by mouth daily.   No facility-administered encounter medications on file as of 08/18/2020.   Fill History: alfuzosin ER 10 mg tablet,extended release 24 hr 08/14/2020 30   atorvastatin 80 mg tablet 07/24/2020 30   CLOPIDOGREL '75MG'$     TAB 08/16/2020 90   METOPROLOL ER '25MG'$   TAB 06/08/2020 90   metoprolol tartrate 25 mg tablet 07/06/2020 14   NITROGLYCER 0.'4MG'$    SUB 06/09/2020 5   pantoprazole 40 mg tablet,delayed release 07/24/2020 30   amlodipine 10 mg tablet 07/24/2020 30    METFORMIN ER '500MG'$   TAB 07/17/2020 90    Reviewed chart for medication changes ahead of medication coordination call.  No OVs, Consults, or hospital visits since last care coordination call/Pharmacist visit. (If appropriate, list visit date, provider name)  No medication changes indicated OR if recent visit, treatment plan here.  BP Readings from Last 3 Encounters:  07/16/20 130/82  06/08/20 120/70  04/20/20 110/64    Lab Results  Component Value Date   HGBA1C 6.5 07/16/2020     Patient obtains medications through Vials  30 Days   Last adherence delivery included: Amlodipine '10mg'$  - one tablet with breakfast Pantoprazole '40mg'$  - one tablet with breakfast Atorvastatin '80mg'$  - one tablet with breakfast Clopidogrel '75mg'$  - one tablet daily with breakfast  Patient declined medication last month: Metoprolol tartrate '25mg'$  - daily with breakfast  Patient is due for next adherence delivery on: 08/26/20. Called patient and reviewed medications and coordinated delivery.  This delivery to include: Amlodipine '10mg'$  - one tablet with breakfast Pantoprazole '40mg'$  - one tablet with breakfast Atorvastatin '80mg'$  - one tablet with breakfast Clopidogrel '75mg'$  - one tablet daily with breakfast Metoprolol tartrate '25mg'$  - daily with breakfast  Confirmed delivery date of 08/26/20, advised patient that pharmacy will contact them the morning of delivery.   Care Gaps:  AWV -  completed on 04/20/20. COVID-19 - booster 4 overdue since 04/19/20  Star Rating Drugs:  Atorvastatin '80mg'$  -  last filled on 07/24/20 20DS at [facility] Metformin '500mg'$   - last filled on 07/17/20 90DS at [place] [facility] [phone]

## 2020-08-20 ENCOUNTER — Telehealth: Payer: Self-pay

## 2020-08-24 ENCOUNTER — Other Ambulatory Visit: Payer: Self-pay

## 2020-08-25 ENCOUNTER — Telehealth: Payer: Self-pay | Admitting: Cardiovascular Disease

## 2020-08-25 ENCOUNTER — Ambulatory Visit (INDEPENDENT_AMBULATORY_CARE_PROVIDER_SITE_OTHER): Payer: Medicare HMO | Admitting: Adult Health

## 2020-08-25 ENCOUNTER — Encounter: Payer: Self-pay | Admitting: Adult Health

## 2020-08-25 VITALS — BP 140/80 | HR 79 | Temp 98.5°F | Ht 67.75 in | Wt 189.0 lb

## 2020-08-25 DIAGNOSIS — M546 Pain in thoracic spine: Secondary | ICD-10-CM

## 2020-08-25 DIAGNOSIS — N644 Mastodynia: Secondary | ICD-10-CM | POA: Diagnosis not present

## 2020-08-25 MED ORDER — CYCLOBENZAPRINE HCL 10 MG PO TABS: 10.0000 mg | ORAL_TABLET | Freq: Every day | ORAL | 0 refills | Status: DC
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
## 2020-08-25 NOTE — Telephone Encounter (Signed)
Spoke to patient.  Informed patient to keep appointment with primary for this afternoon for evaluation. If cardiology is need may call back.  Patient voiced understanding.

## 2020-08-31 ENCOUNTER — Telehealth: Payer: Self-pay | Admitting: Adult Health

## 2020-08-31 NOTE — Telephone Encounter (Signed)
Patient called after hours number requesting a call back.  States that caller is out of Amlodipine, is unsure of what the medication is for and whether he can go without it.

## 2020-09-01 ENCOUNTER — Telehealth: Payer: Self-pay

## 2020-09-01 NOTE — Telephone Encounter (Signed)
Pt informed what the amlodipine is for. Pt also stated that Rx came in already this was not needed anymore.

## 2020-09-01 NOTE — Telephone Encounter (Signed)
[facility] [employer] - [place] Case ID: [id number] - Rx #[id number] Need help? Call us at [phone] Outcome Approvedtoday Your request has been approved Drug Cyclobenzaprine HCl '10MG'$  tablets Form [employer] Electronic [place] Form [phone] NCPDP) Original Claim Info [phone]

## 2020-09-01 NOTE — Telephone Encounter (Signed)
[facility] [employer] - PA Case ID: [id number] - Rx #[id number] Need help? Call us at [phone] Status Sent to [employer] Drug Cyclobenzaprine HCl '10MG'$  tablets Form [employer] Electronic [place] Form [phone] NCPDP) Original Claim Info [phone]

## 2020-09-16 ENCOUNTER — Telehealth: Payer: Self-pay | Admitting: Pharmacist

## 2020-09-16 NOTE — Chronic Care Management (AMB) (Signed)
Chronic Care Management Pharmacy Assistant   Name: Taurence Goos  MRN: [id number] DOB: [birth year]  Reason for Encounter: Disease State and Medication Review   Conditions to be addressed/monitored: HTN and HLD  Recent office visits:  08/25/20 [name] (PCP) - seen for acute bilateral thoracic back pain. Patient started on cyclobenzaprine '10mg'$  - oral daily at bedtime. Follow up as needed.  Recent consult visits:  None.  Hospital visits:  None in previous 6 months  Medications: Outpatient Encounter Medications as of 09/16/2020  Medication Sig   alfuzosin (UROXATRAL) 10 MG 24 hr tablet Take 10 mg by mouth daily.   amLODipine (NORVASC) 10 MG tablet TAKE ONE TABLET BY MOUTH ONCE DAILY   aspirin 81 MG EC tablet Take 1 tablet (81 mg total) by mouth daily.   atorvastatin (LIPITOR) 80 MG tablet Take 1 tablet (80 mg total) by mouth daily.   clopidogrel (PLAVIX) 75 MG tablet Take 1 tablet (75 mg total) by mouth daily.   cyclobenzaprine (FLEXERIL) 10 MG tablet Take 1 tablet (10 mg total) by mouth at bedtime.   metFORMIN (GLUCOPHAGE-XR) 500 MG 24 hr tablet Take 1 tablet (500 mg total) by mouth daily with breakfast.   metoprolol succinate (TOPROL XL) 25 MG 24 hr tablet Take 1 tablet (25 mg total) by mouth daily.   nitroGLYCERIN (NITROSTAT) 0.4 MG SL tablet DISSOLVE ONE TABLET UNDER THE TONGUE EVERY 5 MINUTES AS NEEDED FOR CHEST PAIN.  DO NOT EXCEED A TOTAL OF 3 DOSES IN 15 MINUTES   pantoprazole (PROTONIX) 40 MG tablet Take 1 tablet (40 mg total) by mouth daily.   No facility-administered encounter medications on file as of 09/16/2020.   Fill History: ALFUZOSIN HCL ER '10MG'$  ER TAB 09/16/2020 12   atorvastatin 80 mg tablet 08/24/2020 30   CLOPIDOGREL '75MG'$     TAB 08/16/2020 90   METOPROLOL ER '25MG'$   TAB 06/08/2020 90   metoprolol tartrate 25 mg tablet 08/24/2020 30   MOLNUPIRAVIR '200MG'$  CAP 07/28/2020 5   NITROGLYCER 0.'4MG'$    SUB 06/09/2020 5   ONDANSETRON '4MG'$      TAB  07/29/2020 3   pantoprazole 40 mg tablet,delayed release 08/24/2020 30   amlodipine 10 mg tablet 08/24/2020 30   METFORMIN ER '500MG'$   TAB 07/17/2020 90   Reviewed chart prior to disease state call. Spoke with patient regarding BP  Recent Office Vitals: BP Readings from Last 3 Encounters:  08/25/20 140/80  07/16/20 130/82  06/08/20 120/70   Pulse Readings from Last 3 Encounters:  08/25/20 79  07/16/20 67  06/08/20 72    Wt Readings from Last 3 Encounters:  08/25/20 189 lb (85.7 kg)  07/16/20 196 lb (88.9 kg)  06/08/20 193 lb (87.5 kg)     Kidney Function Lab Results  Component Value Date/Time   CREATININE 1.19 07/16/2020 09:51 AM   CREATININE 1.10 04/20/2020 09:34 AM   CREATININE 1.23 10/08/2019 10:34 AM   CREATININE 1.26 (H) 07/16/2019 08:18 AM   GFR 63.15 07/16/2020 09:51 AM   GFRNONAA >60 11/20/2019 07:50 AM   GFRNONAA 61 10/08/2019 10:34 AM   GFRAA 70 10/08/2019 10:34 AM    BMP Latest Ref Rng & Units 07/16/2020 04/20/2020 11/20/2019  Glucose 70 - 99 mg/dL 103(H) 101(H) 140(H)  BUN 6 - 23 mg/dL 12 12 7(L)  Creatinine 0.40 - 1.50 mg/dL 1.19 1.10 1.20  BUN/Creat Ratio 10 - 24 - 11 -  Sodium 135 - 145 mEq/L 142 142 141  Potassium 3.5 - 5.1 mEq/L  4.3 4.0 3.6  Chloride 96 - 112 mEq/L 105 104 109  CO2 19 - 32 mEq/L '30 26 23  '$ Calcium 8.4 - 10.5 mg/dL 10.1 10.1 9.5    Current antihypertensive regimen:  Metoprolol succinate '25mg'$  - take 1 tablet by mouth daily. Amlodipine '10mg'$  - take 1 tablet by mouth daily. How often are you checking your Blood Pressure? infrequently Current home BP readings: None to report. What recent interventions/DTPs have been made by any provider to improve Blood Pressure control since last CPP Visit: None. Any recent hospitalizations or ED visits since last visit with CPP? No  Adherence Review: Is the patient currently on ACE/ARB medication? No Does the patient have >5 day gap between last estimated fill dates? No   Reviewed chart for  medication changes ahead of medication coordination call.  No OVs, Consults, or hospital visits since last care coordination call/Pharmacist visit. (If appropriate, list visit date, provider name)  No medication changes indicated OR if recent visit, treatment plan here.  BP Readings from Last 3 Encounters:  08/25/20 140/80  07/16/20 130/82  06/08/20 120/70    Lab Results  Component Value Date   HGBA1C 6.5 07/16/2020     Patient obtains medications through Vials  30 Days   Last adherence delivery included: Amlodipine '10mg'$  - one tablet with breakfast Pantoprazole '40mg'$  - one tablet with breakfast Atorvastatin '80mg'$  - one tablet with breakfast Clopidogrel '75mg'$  - one tablet daily with breakfast Metoprolol tartrate '25mg'$  - daily with breakfast   Patient is due for next adherence delivery on: 09/25/20. Called patient and reviewed medications and coordinated delivery.  This delivery to include: Amlodipine '10mg'$  - one tablet with breakfast Pantoprazole '40mg'$  - one tablet with breakfast Atorvastatin '80mg'$  - one tablet with breakfast Clopidogrel '75mg'$  - one tablet daily with breakfast Metoprolol succinate '25mg'$  - daily with breakfast   Confirmed delivery date of 09/25/20, advised patient that pharmacy will contact them the morning of delivery.   Notes: Spoke with patient and reviewed monthly call list. Patient has not been checking his blood pressure or keeping a log. Patient agreed to start checking and keeping a log. Patient does not add salt to food or eat a lot of red meat. Patient walks for activity.  Care Gaps:  AWV -  completed on 04/20/20. COVID-19 - booster 4 overdue since 04/19/20  Star Rating Drugs:  Atorvastatin '80mg'$  - last filled on 07/24/20 20DS at [facility] Metformin '500mg'$   - last filled on 07/17/20 90DS at [place] [phone]

## 2020-09-17 ENCOUNTER — Telehealth: Payer: Self-pay | Admitting: Interventional Cardiology

## 2020-09-17 NOTE — Telephone Encounter (Signed)
Patient states he has been having muscle spasms in his chest on the breastbone area. He says he had it yesterday evening while at work and wants to know if this is normal.

## 2020-09-17 NOTE — Telephone Encounter (Signed)
Pt states he was at work at the [employer] yesterday and noticed a couple of muscle twitches while working.  They were in his chest and went into his axillary area.  Denies pain and they only lasted a couple of seconds.  No SOB or other symptoms associated with them.  Has not occurred anymore since yesterday.  Goes walking at the gym with no issues.  Advised pt to continue to monitor and if the spasms continue, let PCP know.  Pt agreeable to plan.

## 2020-10-12 ENCOUNTER — Other Ambulatory Visit: Payer: Self-pay

## 2020-10-13 ENCOUNTER — Other Ambulatory Visit: Payer: Self-pay | Admitting: Adult Health

## 2020-10-13 ENCOUNTER — Encounter: Payer: Self-pay | Admitting: Adult Health

## 2020-10-13 ENCOUNTER — Ambulatory Visit (INDEPENDENT_AMBULATORY_CARE_PROVIDER_SITE_OTHER): Payer: Medicare HMO | Admitting: Adult Health

## 2020-10-13 VITALS — BP 110/80 | HR 66 | Temp 97.5°F | Ht 67.75 in | Wt 192.0 lb

## 2020-10-13 DIAGNOSIS — M546 Pain in thoracic spine: Secondary | ICD-10-CM

## 2020-10-13 DIAGNOSIS — I10 Essential (primary) hypertension: Secondary | ICD-10-CM | POA: Diagnosis not present

## 2020-10-13 MED ORDER — METHYLPREDNISOLONE 4 MG PO TBPK: ORAL_TABLET | ORAL | 0 refills | Status: DC

## 2020-10-13 MED ORDER — AMLODIPINE BESYLATE 10 MG PO TABS: 10.0000 mg | ORAL_TABLET | Freq: Every day | ORAL | 1 refills | Status: DC

## 2020-10-13 MED ORDER — CYCLOBENZAPRINE HCL 10 MG PO TABS: 10.0000 mg | ORAL_TABLET | Freq: Three times a day (TID) | ORAL | 0 refills | Status: DC | PRN

## 2020-10-13 NOTE — Patient Instructions (Signed)
It was great seeing you today   I have sent in the medrol dose pack.   I have also sent in a muscle relaxer called Flexeril, take this at night
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
## 2020-10-14 ENCOUNTER — Telehealth: Payer: Self-pay | Admitting: Pharmacist

## 2020-10-14 NOTE — Chronic Care Management (AMB) (Signed)
Chronic Care Management Pharmacy Assistant   Name: Dylan Lane  MRN: [id number] DOB: 1950/07/03  Reason for Encounter: Medication Review/ Medication Coordination Call.    Recent office visits:  10/13/20 [name] (PCP) - seen for acute bilateral thoracic back pain. Patient started on methylprednisolone 4mg  as directed and increased cyclobenzaprine 10mg  to 3 times daily prn from daily at bedtime. Follow up as needed.   Recent consult visits:  None.  Hospital visits:  None in previous 6 months  Medications: Outpatient Encounter Medications as of 10/14/2020  Medication Sig   alfuzosin (UROXATRAL) 10 MG 24 hr tablet Take 10 mg by mouth daily.   amLODipine (NORVASC) 10 MG tablet Take 1 tablet (10 mg total) by mouth daily.   aspirin 81 MG EC tablet Take 1 tablet (81 mg total) by mouth daily.   atorvastatin (LIPITOR) 80 MG tablet Take 1 tablet (80 mg total) by mouth daily.   clopidogrel (PLAVIX) 75 MG tablet Take 1 tablet (75 mg total) by mouth daily.   cyclobenzaprine (FLEXERIL) 10 MG tablet Take 1 tablet (10 mg total) by mouth 3 (three) times daily as needed for muscle spasms.   metFORMIN (GLUCOPHAGE-XR) 500 MG 24 hr tablet Take 1 tablet by mouth once daily with breakfast   methylPREDNISolone (MEDROL DOSEPAK) 4 MG TBPK tablet Take as directed   metoprolol succinate (TOPROL XL) 25 MG 24 hr tablet Take 1 tablet (25 mg total) by mouth daily.   nitroGLYCERIN (NITROSTAT) 0.4 MG SL tablet DISSOLVE ONE TABLET UNDER THE TONGUE EVERY 5 MINUTES AS NEEDED FOR CHEST PAIN.  DO NOT EXCEED A TOTAL OF 3 DOSES IN 15 MINUTES   pantoprazole (PROTONIX) 40 MG tablet Take 1 tablet (40 mg total) by mouth daily.   No facility-administered encounter medications on file as of 10/14/2020.   Fill History: alfuzosin ER 10 mg tablet,extended release 24 hr 09/25/2020 30   atorvastatin 80 mg tablet 09/23/2020 30   CLOPIDOGREL 75MG     TAB 08/16/2020 90   cyclobenzaprine 10 mg tablet  10/13/2020 4   metoprolol succinate ER 25 mg tablet,extended release 24 hr 09/23/2020 30   metoprolol tartrate 25 mg tablet 08/24/2020 30   MOLNUPIRAVIR 200MG  CAP 07/28/2020 5   NITROGLYCER 0.4MG    SUB 06/09/2020 5   ONDANSETRON 4MG      TAB 07/29/2020 3   pantoprazole 40 mg tablet,delayed release 09/23/2020 30   amlodipine 10 mg tablet 09/23/2020 30   METFORMIN ER 500MG  GP TAB 10/14/2020 90   methylprednisolone 4 mg tablet 10/13/2020 6   Reviewed chart for medication changes ahead of medication coordination call.  No OVs, Consults, or hospital visits since last care coordination call/Pharmacist visit. (If appropriate, list visit date, provider name)  No medication changes indicated OR if recent visit, treatment plan here.  BP Readings from Last 3 Encounters:  10/13/20 110/80  08/25/20 140/80  07/16/20 130/82    Lab Results  Component Value Date   HGBA1C 6.5 07/16/2020     Patient obtains medications through Vials  30 Days   Last adherence delivery included:  Amlodipine 10mg  - one tablet with breakfast Pantoprazole 40mg  - one tablet with breakfast Atorvastatin 80mg  - one tablet with breakfast Clopidogrel 75mg  - one tablet daily with breakfast Metoprolol succinate 25mg  - daily with breakfast  Patient declined (meds) last month due to PRN use/additional supply on hand. None  Patient is due for next adherence delivery on: 10/26/20. Called patient and reviewed medications and coordinated delivery.  This delivery  to include: Amlodipine 10mg  - one tablet with breakfast Pantoprazole 40mg  - one tablet with breakfast Atorvastatin 80mg  - one tablet with breakfast Metoprolol succinate 25mg  - daily with breakfast Alfuzosin ER 10mg  - take daily at breakfast   Patient declined the following medications due to having some left. Clopidogrel 75mg  - one tablet daily at breakfast.  Confirmed delivery date of 10/26/20, advised patient that pharmacy will contact them the  morning of delivery.   Care Gaps:  AWV -  completed on 04/20/20. COVID-19 - booster 4 overdue since 04/19/20  Star Rating Drugs:  Atorvastatin 80mg  - last filled on 09/23/20 20DS at [facility] Metformin 500mg   - last filled on 10/14/20 90DS at [facility] [phone]

## 2020-10-19 ENCOUNTER — Telehealth: Payer: Self-pay | Admitting: Interventional Cardiology

## 2020-10-19 NOTE — Telephone Encounter (Signed)
pressure at the tip of his shoulder blade, pt would like to get an x-ray to make sure nothing is wrong... please advise.

## 2020-10-19 NOTE — Telephone Encounter (Signed)
Spoke with pt and he states for about a month now he has noticed a slight pressure at the tip of his left scapula and breast area.  Denies pain.  Comes and goes.  Feels like a pulled muscle.  Doesn't change with movement.  Took Tylenol and it went away.  Pt mentioned to PCP and they gave a Prednisone dose pack and Flexeril.  Pt has not taken either one.  Advised pt to try those and see if any improvement.  Advised if no improvement, contact PCP for further instructions as this does not sound cardiac related.  Pt was worried that something was wrong with stent.  Advised stent will not cause these problems.  Pt appreciative for call.

## 2020-10-27 ENCOUNTER — Telehealth: Payer: Self-pay
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
## 2020-10-27 NOTE — Telephone Encounter (Signed)
Patient called requesting a call back no information given.

## 2020-10-28 NOTE — Telephone Encounter (Signed)
Patient is requesting a call back from [place].

## 2020-10-29 NOTE — Telephone Encounter (Signed)
Called pt and pt stated he did not need anything.

## 2020-11-03 ENCOUNTER — Encounter: Payer: Self-pay | Admitting: Adult Health

## 2020-11-03 ENCOUNTER — Other Ambulatory Visit: Payer: Self-pay

## 2020-11-03 ENCOUNTER — Ambulatory Visit (INDEPENDENT_AMBULATORY_CARE_PROVIDER_SITE_OTHER): Payer: Medicare HMO | Admitting: Adult Health

## 2020-11-03 VITALS — BP 120/70 | HR 82 | Temp 98.5°F | Ht 67.75 in | Wt 192.0 lb

## 2020-11-03 DIAGNOSIS — Z23 Encounter for immunization: Secondary | ICD-10-CM

## 2020-11-03 DIAGNOSIS — M549 Dorsalgia, unspecified: Secondary | ICD-10-CM | POA: Diagnosis not present
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
## 2020-11-13 ENCOUNTER — Telehealth: Payer: Self-pay | Admitting: Pharmacist

## 2020-11-13 NOTE — Chronic Care Management (AMB) (Signed)
Chronic Care Management Pharmacy Assistant   Name: Reyansh Kushnir  MRN: [id number] DOB: [birth year]  Reason for Encounter: Medication Review/ Medication Coordination Call   Conditions to be addressed/monitored: HTN   Recent office visits:  11/03/2020 [name] (PCP) - Patient was seen for other acute back pain and an additional issue. No Medication changes. No follow up noted.  Recent consult visits:  None  Hospital visits:  None  Medications: Outpatient Encounter Medications as of 11/13/2020  Medication Sig   alfuzosin (UROXATRAL) 10 MG 24 hr tablet Take 10 mg by mouth daily.   amLODipine (NORVASC) 10 MG tablet Take 1 tablet (10 mg total) by mouth daily.   aspirin 81 MG EC tablet Take 1 tablet (81 mg total) by mouth daily.   atorvastatin (LIPITOR) 80 MG tablet Take 1 tablet (80 mg total) by mouth daily.   clopidogrel (PLAVIX) 75 MG tablet Take 1 tablet (75 mg total) by mouth daily.   cyclobenzaprine (FLEXERIL) 10 MG tablet Take 1 tablet (10 mg total) by mouth 3 (three) times daily as needed for muscle spasms.   metFORMIN (GLUCOPHAGE-XR) 500 MG 24 hr tablet Take 1 tablet by mouth once daily with breakfast   methylPREDNISolone (MEDROL DOSEPAK) 4 MG TBPK tablet Take as directed   metoprolol succinate (TOPROL XL) 25 MG 24 hr tablet Take 1 tablet (25 mg total) by mouth daily.   nitroGLYCERIN (NITROSTAT) 0.4 MG SL tablet DISSOLVE ONE TABLET UNDER THE TONGUE EVERY 5 MINUTES AS NEEDED FOR CHEST PAIN.  DO NOT EXCEED A TOTAL OF 3 DOSES IN 15 MINUTES   pantoprazole (PROTONIX) 40 MG tablet Take 1 tablet (40 mg total) by mouth daily.   No facility-administered encounter medications on file as of 11/13/2020.   Fill History: alfuzosin ER 10 mg tablet,extended release 24 hr 09/25/2020 30    atorvastatin 80 mg tablet 10/19/2020 30    CLOPIDOGREL 75MG     TAB 08/16/2020 90    metoprolol succinate ER 25 mg tablet,extended release 24 hr 09/23/2020 30    metoprolol tartrate  25 mg tablet 08/24/2020 30     pantoprazole 40 mg tablet,delayed release 09/23/2020 30    amlodipine 10 mg tablet 09/23/2020 30    METFORMIN ER 500MG  GP TAB 10/14/2020 90    methylprednisolone 4 mg tablet 10/13/2020 6   Reviewed chart for medication changes ahead of medication coordination call.  No OVs, Consults, or hospital visits since last care coordination call/Pharmacist visit. (If appropriate, list visit date, provider name)  No medication changes indicated OR if recent visit, treatment plan here.  BP Readings from Last 3 Encounters:  11/03/20 120/70  10/13/20 110/80  08/25/20 140/80    Lab Results  Component Value Date   HGBA1C 6.5 07/16/2020     Patient obtains medications through Vials  30 Days   Last adherence delivery included:  Amlodipine 10mg  - one tablet with breakfast Pantoprazole 40mg  - one tablet with breakfast Atorvastatin 80mg  - one tablet with breakfast Metoprolol succinate 25mg  - daily with breakfast Alfuzosin ER 10mg  - take daily at breakfast  Patient declined Clopidogrel 75mg  last month due to additional supply on hand.  Patient is due for next adherence delivery on: 11/24/2020.  Called patient and reviewed medications and coordinated delivery.  This delivery to include: Amlodipine 10mg  - one tablet with breakfast Pantoprazole 40mg  - one tablet with breakfast Atorvastatin 80mg  - one tablet with breakfast Metoprolol succinate 25mg  - daily with breakfast Alfuzosin ER 10mg  - take daily at  breakfast Plavix 75 mg 1 tablet daily  Patient will need a short fill: None  Coordinated acute fill: None  Confirmed delivery date of 11/24/2020, advised patient that pharmacy will contact them the morning of delivery.  Care Gaps: AWV -  completed on 04/20/20. COVID-19 - overdue Last BP - 120/70 on 11/03/2020  Star Rating Drugs: Atorvastatin 80mg  - last filled on 09/23/20 20DS at [facility] Metformin 500mg   - last filled on 10/14/20 90DS at  [facility] [phone]

## 2020-11-13 NOTE — Chronic Care Management (AMB) (Signed)
    Chronic Care Management Pharmacy Assistant   Name: Dylan Lane  MRN: [id number] DOB: 04/15/1950  11/16/2020 APPOINTMENT REMINDER   Iona Beard Junior Quebedeaux was reminded to have all medications, supplements and any blood glucose and blood pressure readings available for review with Jeni Salles, Pharm. D, at his office visit on 11/16/2020 at 10:00.   Questions: Have you had any recent office visit or specialist visit outside of [place]? No  Are there any concerns you would like to discuss during your office visit? No  Are you having any problems obtaining your medications? (Whether it pharmacy issues or cost) Since leaving [facility] and changing to [facility] the cost of his medications has increased.   If patient has any PAP medications ask if they are having any problems getting their PAP medication or refill? No  Care Gaps: AWV -  completed on 04/20/20. COVID-19 - overdue Last BP - 120/70 on 11/03/2020 Last A1C - 6.5 on 07/16/2020  Star Rating Drug: Atorvastatin 80mg  - last filled on 10/19/2020 30DS at [facility] Metformin 500mg   - last filled on 10/14/20 90DS at [facility]  Any gaps in medications fill history?   [place] Pharmacist Assistant [phone]

## 2020-11-16 ENCOUNTER — Ambulatory Visit: Payer: Medicare HMO

## 2020-11-16 NOTE — Progress Notes (Deleted)
Chronic Care Management Pharmacy Note  11/16/2020 Name:  Dylan Lane MRN:  [id number] DOB:  09-19-1950  Summary: ***  Recommendations/Changes made from today's visit: ***  Plan: ***  Subjective: Dylan Lane is an 70 y.o. year old male who is a primary patient of Dylan Peng, NP.  The CCM team was consulted for assistance with disease management and care coordination needs.    Engaged with patient face to face for follow up visit in response to provider referral for pharmacy case management and/or care coordination services.   Consent to Services:  The patient was given information about Chronic Care Management services, agreed to services, and gave verbal consent prior to initiation of services.  Please see initial visit note for detailed documentation.   Patient Care Team: Dylan Peng, NP as PCP - General (Family Medicine) Belva Crome, MD as PCP - Cardiology (Cardiology) Ortho, [facility] (Specialist) [name], [name], MD as Consulting Physician (Gastroenterology) Viona Gilmore, [facility] as Pharmacist (Pharmacist)  Recent office visits: 11/03/20 Dylan Peng, NP: Patient presented for back pain follow up. Influenza vaccine administered.  10/13/20 Dylan Peng NP - seen for acute bilateral thoracic back pain. Patient started on methylprednisolone 35m as directed and increased cyclobenzaprine 154mto 3 times daily prn from daily at bedtime. Follow up as needed.   08/25/20 CoDorothyann PengP - seen for acute bilateral thoracic back pain. Patient started on cyclobenzaprine 105m oral daily at bedtime. Follow up as needed.  07/28/20 Dylan Lane - video visit for Covid-19 virus infection. Patient started on Molnupiravir 800m15mtimes daily and Ondansetron 4mg 69mry 8 hours as needed. Follow up as needed.   07/16/20 Dylan Lane seen for routine general medical exam and other chronic conditions. Discontinued acetaminophen. Prescribed metformin 500 mg  daily.   Recent consult visits: 06/08/20 HenryBelva CromeCardiology) - seen for CAD and other chronic conditions. Patient switched to metoprolol succinate from metoprolol tartrate. Order placed for sleep study. Follow up in 6 months.   Hospital visits: None in previous 6 months  Objective:  Lab Results  Component Value Date   CREATININE 1.19 07/16/2020   BUN 12 07/16/2020   GFR 63.15 07/16/2020   GFRNONAA >60 11/20/2019   GFRAA 70 10/08/2019   NA 142 07/16/2020   K 4.3 07/16/2020   CALCIUM 10.1 07/16/2020   CO2 30 07/16/2020   GLUCOSE 103 (H) 07/16/2020    Lab Results  Component Value Date/Time   HGBA1C 6.5 07/16/2020 09:51 AM   HGBA1C 5.8 (H) 07/16/2019 08:18 AM   GFR 63.15 07/16/2020 09:51 AM   GFR 70.51 03/19/2019 11:04 AM    Last diabetic Eye exam: No results found for: HMDIABEYEEXA  Last diabetic Foot exam: No results found for: HMDIABFOOTEX   Lab Results  Component Value Date   CHOL 119 07/16/2020   HDL 38.90 (L) 07/16/2020   LDLCALC 69 07/16/2020   TRIG 56.0 07/16/2020   CHOLHDL 3 07/16/2020    Hepatic Function Latest Ref Rng & Units 07/16/2020 11/04/2019 10/08/2019  Total Protein 6.0 - 8.3 g/dL 6.9 6.9 7.2  Albumin 3.5 - 5.2 g/dL 4.6 4.1 -  AST 0 - 37 U/L 19 16 12   ALT 0 - 53 U/L 26 22 14   Alk Phosphatase 39 - 117 U/L 120(H) 91 -  Total Bilirubin 0.2 - 1.2 mg/dL 0.9 0.7 0.5    Lab Results  Component Value Date/Time   TSH 1.55 07/16/2020 09:51 AM  TSH 1.85 07/16/2019 08:18 AM    CBC Latest Ref Rng & Units 07/16/2020 11/20/2019 11/04/2019  WBC 4.0 - 10.5 K/uL 5.6 5.3 9.1  Hemoglobin 13.0 - 17.0 g/dL 15.0 14.0 14.6  Hematocrit 39.0 - 52.0 % 43.7 41.0 42.4  Platelets 150.0 - 400.0 K/uL 174.0 214 235    Lab Results  Component Value Date/Time   VD25OH 15 (L) 06/05/2013 11:58 AM    Clinical ASCVD: Yes  The ASCVD Risk score (Arnett DK, et al., 2019) failed to calculate for the following reasons:   The patient has a prior MI or stroke diagnosis     Depression screen [facility] 2/9 04/20/2020 04/20/2020 12/10/2019  Decreased Interest 0 0 0  Down, Depressed, Hopeless 0 0 1  PHQ - 2 Score 0 0 1  Altered sleeping - - -  Tired, decreased energy - - -  Change in appetite - - -  Feeling bad or failure about yourself  - - -  Trouble concentrating - - -  Moving slowly or fidgety/restless - - -  Suicidal thoughts - - -  PHQ-9 Score - - -  Difficult doing work/chores - - -  Some recent data might be hidden      Social History   Tobacco Use  Smoking Status Former   Types: Cigarettes   Quit date: 03/14/2000   Years since quitting: 20.6  Smokeless Tobacco Never   BP Readings from Last 3 Encounters:  11/03/20 120/70  10/13/20 110/80  08/25/20 140/80   Pulse Readings from Last 3 Encounters:  11/03/20 82  10/13/20 66  08/25/20 79   Wt Readings from Last 3 Encounters:  11/03/20 192 lb (87.1 kg)  10/13/20 192 lb (87.1 kg)  08/25/20 189 lb (85.7 kg)   BMI Readings from Last 3 Encounters:  11/03/20 29.41 kg/m  10/13/20 29.41 kg/m  08/25/20 28.95 kg/m    Assessment/Interventions: Review of patient past medical history, allergies, medications, health status, including review of consultants reports, laboratory and other test data, was performed as part of comprehensive evaluation and provision of chronic care management services.   SDOH:  (Social Determinants of Health) assessments and interventions performed: Yes   SDOH Screenings   Alcohol Screen: Low Risk    Last Alcohol Screening Score (AUDIT): 0  Depression (PHQ2-9): Low Risk    PHQ-2 Score: 0  Financial Resource Strain: Medium Risk   Difficulty of Paying Living Expenses: Somewhat hard  Food Insecurity: No Food Insecurity   Worried About Charity fundraiser in the Last Year: Never true   Ran Out of Food in the Last Year: Never true  Housing: Low Risk    Last Housing Risk Score: 0  Physical Activity: Insufficiently Active   Days of Exercise per Week: 3 days   Minutes  of Exercise per Session: 30 min  Social Connections: Moderately Isolated   Frequency of Communication with Friends and Family: More than three times a week   Frequency of Social Gatherings with Friends and Family: More than three times a week   Attends Religious Services: 1 to 4 times per year   Active Member of [employer] or Organizations: No   Attends Music therapist: Never   Marital Status: Never married  Stress: No Stress Concern Present   Feeling of Stress : Not at all  Tobacco Use: Medium Risk   Smoking Tobacco Use: Former   Smokeless Tobacco Use: Never   Passive Exposure: Not on file  Transportation Needs: No Transportation Needs  Lack of Transportation (Medical): No   Lack of Transportation (Non-Medical): No    Patient was having issues with medications in the beginning and now alfluzosin is the only one that costs money. Patient also inquired about getting a refill for nitroglycerin and recommended for him to reach out to cardiologist for refill.  Patient reports he is trying to keep up with his health. He was going to the gym and walking at 5am. His brother is a Biomedical scientist and was making some food for him. He typically eats macaroni salad, fried chicken every once in a while, not much red meat,  loves asparagus, spinach, collard greens,  lima beans and corn, and loves stir fry. He eats watermelon during the summer and tries to get whole wheat bread. He does admit to drinking regular sodas such as orange crush or pepsi.   CCM Care Plan  Allergies  Allergen Reactions   Amoxicillin Hives         Medications Reviewed Today     Reviewed by Franco Collet, CMA (Certified Medical Assistant) on 11/03/20 at 1454  Med List Status: <None>   Medication Order Taking? Sig Documenting Provider Last Dose Status Informant  alfuzosin (UROXATRAL) 10 MG 24 hr tablet [id number] No Take 10 mg by mouth daily. [provider] Taking Active   amLODipine (NORVASC) 10 MG tablet  [id number]  Take 1 tablet (10 mg total) by mouth daily. Nafziger, Tommi Rumps, NP  Active   aspirin 81 MG EC tablet [id number] No Take 1 tablet (81 mg total) by mouth daily. Jessee Avers, MD Taking Active Self  atorvastatin (LIPITOR) 80 MG tablet [id number] No Take 1 tablet (80 mg total) by mouth daily. Belva Crome, MD Taking Active   clopidogrel (PLAVIX) 75 MG tablet [id number] No Take 1 tablet (75 mg total) by mouth daily. Belva Crome, MD Taking Active   cyclobenzaprine (FLEXERIL) 10 MG tablet [id number]  Take 1 tablet (10 mg total) by mouth 3 (three) times daily as needed for muscle spasms. Nafziger, Tommi Rumps, NP  Active   metFORMIN (GLUCOPHAGE-XR) 500 MG 24 hr tablet [id number]  Take 1 tablet by mouth once daily with breakfast Nafziger, Tommi Rumps, NP  Active   methylPREDNISolone (MEDROL DOSEPAK) 4 MG TBPK tablet [id number]  Take as directed Nafziger, Tommi Rumps, NP  Active   metoprolol succinate (TOPROL XL) 25 MG 24 hr tablet [id number] No Take 1 tablet (25 mg total) by mouth daily. Belva Crome, MD Taking Active   nitroGLYCERIN (NITROSTAT) 0.4 MG SL tablet [id number] No DISSOLVE ONE TABLET UNDER THE TONGUE EVERY 5 MINUTES AS NEEDED FOR CHEST PAIN.  DO NOT EXCEED A TOTAL OF 3 DOSES IN 15 MINUTES Lorretta Harp, MD Taking Active   pantoprazole (PROTONIX) 40 MG tablet [id number] No Take 1 tablet (40 mg total) by mouth daily. Jerene Bears, MD Taking Active             Patient Active Problem List   Diagnosis Date Noted   CAD S/P percutaneous coronary angioplasty 11/06/2019   Dyspnea 11/06/2019   NSTEMI (non-ST elevated myocardial infarction) ([place]) 10/21/2019   Acquired trigger finger of left middle finger 08/13/2019   Pain in finger of left hand 08/13/2019   Erectile dysfunction 08/09/2016   Low testosterone in male 08/09/2016   BPH (benign prostatic hyperplasia) 01/23/2015   History of glaucoma 10/10/2014   Vitamin D deficiency 04/18/2014   GERD (gastroesophageal reflux disease) 06/05/2013    Prediabetes 06/05/2013   High risk homosexual behavior 06/05/2013  Essential hypertension 12/07/2009   COLONIC POLYPS, ADENOMATOUS 01/05/2006   Hyperlipidemia 01/05/2006    Immunization History  Administered Date(s) Administered   Influenza Split 11/21/2011   Influenza Whole 11/23/2009   Influenza,inj,Quad PF,6+ Mos 12/06/2018, 11/03/2020   Influenza,inj,Quad PF,6-35 Mos 01/29/2016   Moderna SARS-COV2 Booster Vaccination 12/20/2019   PFIZER(Purple Top)SARS-COV-2 Vaccination 02/07/2019, 02/28/2019   Pneumococcal Conjugate-13 07/16/2019   Pneumococcal Polysaccharide-23 07/16/2020   Zoster Recombinat (Shingrix) 07/16/2019, 10/08/2019    Conditions to be addressed/monitored:  Hypertension, Hyperlipidemia, Coronary Artery Disease, GERD, BPH and Prediabetes  Conditions addressed this visit: ***  There are no care plans that you recently modified to display for this patient.    Medication Assistance: None required.  Patient affirms current coverage meets needs.  Compliance/Adherence/Medication fill history: Care Gaps: COVID booster Last BP - 120/70 on 11/03/2020 Last A1C - 6.5 on 07/16/2020  Star-Rating Drugs: Atorvastatin 62m - last filled on 10/19/2020 30DS at [facility] Metformin 5034m - [facility] [address]: [phone]: [phone]one: [phone]: [phone] [place] [address] [place]: [phone]: [phone] pill box? Yes - not using consistently (was lazy) Pt endorses 99% compliance  We discussed: Benefits of medication synchronization, packaging and delivery as well  as enhanced pharmacist oversight with [facility]. Patient decided to: Utilize [facility] pharmacy for medication synchronization, packaging and delivery  Care Plan and Follow Up Patient Decision:  Patient agrees to Care Plan and Follow-up.  Plan: The care management team will reach out to the patient again over the next 30 days.  MaJeni SallesPharmD [facility] Pharmacist [place] [facility] [place]

## 2020-12-07 ENCOUNTER — Telehealth: Payer: Self-pay | Admitting: Interventional Cardiology

## 2020-12-07 NOTE — Telephone Encounter (Signed)
Spoke to the patient who states that he has had on and off chest pressure that started on Saturday. He states that is does not feel the same as when he had a heart attack. He denies any radiation down his arm, no SOB, nausea, dizziness. He states that he does have some back pain. He reports that pressure comes on with movement and goes away with rest. He did have an episode this morning when he was walking up the stairs. Patient has not taken any nitroglycerin for his pain. Patient reports that pressure is similar to what he had a couple of months ago when he thought he may have pulled a muscle. He was given PRN flexeril at that time. He has not taken any for this new onset of pressure. He states that he is nervous to take it because it makes his heart race. He has also not taken any tylenol. Advised patient to at least try some tylenol to see it improves. He denies any current chest pain/pressure. Advised that I would make Dr. Tamala Julian and his nurse aware and to let us know if he develops any additional symptoms.

## 2020-12-07 NOTE — Telephone Encounter (Signed)
Pt c/o of Chest Pain: STAT if CP now or developed within 24 hours  1. Are you having CP right now? CHEST PRESSURE   2. Are you experiencing any other symptoms (ex. SOB, nausea, vomiting, sweating)? NO TO ALL  3. How long have you been experiencing CP? CHEST PRESSURE SINCE YESTERDAY  4. Is your CP continuous or coming and going? COMING AND GOING  5. Have you taken Nitroglycerin? NO PT HAVE NOT TAKEN ANY NITRO ?
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
## 2020-12-07 NOTE — Telephone Encounter (Signed)
Spoke with the patient and gave him recommendations from Dr. Tamala Julian. Patient has been scheduled to see DOD tomorrow 12/6. Patient verbalized understanding.
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
## 2020-12-08 ENCOUNTER — Other Ambulatory Visit: Payer: Self-pay | Admitting: Cardiovascular Disease

## 2020-12-08 ENCOUNTER — Ambulatory Visit (INDEPENDENT_AMBULATORY_CARE_PROVIDER_SITE_OTHER): Payer: Medicare HMO | Admitting: Cardiovascular Disease

## 2020-12-08 ENCOUNTER — Encounter: Payer: Self-pay | Admitting: Cardiovascular Disease

## 2020-12-08 ENCOUNTER — Other Ambulatory Visit: Payer: Self-pay

## 2020-12-08 VITALS — BP 104/72 | HR 64 | Ht 67.0 in | Wt 203.0 lb

## 2020-12-08 DIAGNOSIS — E782 Mixed hyperlipidemia: Secondary | ICD-10-CM

## 2020-12-08 DIAGNOSIS — I209 Angina pectoris, unspecified: Secondary | ICD-10-CM

## 2020-12-08 DIAGNOSIS — I10 Essential (primary) hypertension: Secondary | ICD-10-CM | POA: Diagnosis not present

## 2020-12-08 MED ORDER — SODIUM CHLORIDE 0.9% FLUSH: 3.0000 mL | Freq: Two times a day (BID) | INTRAVENOUS | Status: AC

## 2020-12-08 NOTE — Patient Instructions (Signed)
Medication Instructions:  Your physician recommends that you continue on your current medications as directed. Please refer to the Current Medication list given to you today.  *If you need a refill on your cardiac medications before your next appointment, please call your pharmacy*   Lab Work: TODAY: BMP, CBC If you have labs (blood work) drawn today and your tests are completely normal, you will receive your results only by: [place] (if you have MyChart) OR A paper copy in the mail If you have any lab test that is abnormal or we need to change your treatment, we will call you to review the results.   Testing/Procedures: Your physician has requested that you have a cardiac catheterization. Cardiac catheterization is used to diagnose and/or treat various heart conditions. Doctors may recommend this procedure for a number of different reasons. The most common reason is to evaluate chest pain. Chest pain can be a symptom of coronary artery disease (CAD), and cardiac catheterization can show whether plaque is narrowing or blocking your heart's arteries. This procedure is also used to evaluate the valves, as well as measure the blood flow and oxygen levels in different parts of your heart. For further information please visit [email or web address]. Please follow instruction sheet, as given.    Follow-Up: As scheduled At [facility], you and your health needs are our priority.  As part of our continuing mission to provide you with exceptional heart care, we have created designated Provider Care Teams.  These Care Teams include your primary Cardiologist (physician) and Advanced Practice Providers (APPs -  Physician Assistants and Nurse Practitioners) who all work together to provide you with the care you need, when you need it.  We recommend signing up for the patient portal called "MyChart".  Sign up information is provided on this After Visit Summary.  MyChart is used to connect with  patients for Virtual Visits (Telemedicine).  Patients are able to view lab/test results, encounter notes, upcoming appointments, etc.  Non-urgent messages can be sent to your provider as well.   To learn more about what you can do with MyChart, go to [email or web address].     Provider:   Sinclair Grooms, MD {   Other Instructions  [facility]ICE [place], [place] [address] Dept: [phone] Loc: [phone]  Emmet Junior Knierim  12/08/2020  You are scheduled for a Cardiac Catheterization on Thursday, December 8 with Dr. Daneen Schick.  1. Please arrive at the [facility] (Main Entrance A) at [facility]: [address] [place], [place] [address] at 10:00 AM (This time is two hours before your procedure to ensure your preparation). Free valet parking service is available.   Special note: Every effort is made to have your procedure done on time. Please understand that emergencies sometimes delay scheduled procedures.  2. Diet: Do not eat solid foods after midnight.  The patient may have clear liquids until 5am upon the day of the procedure.  3. Labs: Drawn today  4. Medication instructions in preparation for your procedure:   Contrast Allergy: No    Do not take Diabetes Med Glucophage (Metformin) on the day of the procedure and HOLD 48 HOURS AFTER THE PROCEDURE.  On the morning of your procedure, take your Plavix/Clopidogrel and any morning medicines NOT listed above.  You may use sips of water.  5. Plan for one night stay--bring personal belongings. 6. Bring a current list  of your medications and current insurance cards. 7. You MUST have a responsible person to drive you home. 8. Someone MUST be with you the first 24 hours after you arrive home or your discharge will be delayed. 9. Please wear clothes that are easy to get on and off and wear slip-on  shoes.  Thank you for allowing [place] to care for you!   -- [facility]

## 2020-12-09 ENCOUNTER — Telehealth: Payer: Self-pay | Admitting: Interventional Cardiology

## 2020-12-09 ENCOUNTER — Telehealth: Payer: Self-pay | Admitting: *Deleted

## 2020-12-09 LAB — CBC
Hematocrit: 41.1 % (ref 37.5–51.0)
Hemoglobin: 14.6 g/dL (ref 13.0–17.7)
MCH: 30 pg (ref 26.6–33.0)
MCHC: 35.5 g/dL (ref 31.5–35.7)
MCV: 85 fL (ref 79–97)
Platelets: 178 10*3/uL (ref 150–450)
RBC: 4.86 x10E6/uL (ref 4.14–5.80)
RDW: 13.6 % (ref 11.6–15.4)
WBC: 6 10*3/uL (ref 3.4–10.8)

## 2020-12-09 LAB — BASIC METABOLIC PANEL
BUN/Creatinine Ratio: 11 (ref 10–24)
BUN: 12 mg/dL (ref 8–27)
CO2: 25 mmol/L (ref 20–29)
Calcium: 10.2 mg/dL (ref 8.6–10.2)
Chloride: 105 mmol/L (ref 96–106)
Creatinine, Ser: 1.07 mg/dL (ref 0.76–1.27)
Glucose: 92 mg/dL (ref 70–99)
Potassium: 3.8 mmol/L (ref 3.5–5.2)
Sodium: 143 mmol/L (ref 134–144)
eGFR: 76 mL/min/{1.73_m2} (ref >59)

## 2020-12-09 NOTE — Telephone Encounter (Signed)
Spoke with pt and went over labs.  Pt appreciative for call.

## 2020-12-09 NOTE — Telephone Encounter (Signed)
Pt reaching out for lab results... please advise

## 2020-12-09 NOTE — H&P (Signed)
H/o distal circumflex stent 11/2019 with moderate residual Prox LAD/diag [place] [address] Recurrent angina. Suspect progression of Lad

## 2020-12-09 NOTE — Telephone Encounter (Signed)
Cardiac catheterization scheduled at [facility] for: Thursday December 10, 2020 [facility] Main Entrance A [facility]) at: 10 AM    Diet-no solid food after midnight prior to cath, clear liquids until 5 AM day of procedure.  Medication instructions for procedure: -Hold:   Metformin-day of procedure and 48 hours post procedure -Except hold medications usual morning medications can be taken pre-cath with sips of water including aspirin 81 mg and Plavix 75 mg    Confirmed patient has responsible adult to drive home post procedure and be with patient first 24 hours after arriving home.  [facility] does allow one visitor to accompany you and wait in the hospital waiting room while you are there for your procedure. You and your visitor will be asked to wear a mask once you enter the hospital.   Patient reports does not currently have any new symptoms concerning for COVID-19 and no household members with COVID-19 like illness.        Reviewed procedure/mask/visitor instructions with patient.

## 2020-12-10 ENCOUNTER — Ambulatory Visit (HOSPITAL_COMMUNITY)
Admission: RE | Admit: 2020-12-10 | Discharge: 2020-12-10 | Disposition: A | Discharge status: Home / Self Care | Payer: Medicare HMO | Attending: Interventional Cardiology | Admitting: Interventional Cardiology

## 2020-12-10 ENCOUNTER — Other Ambulatory Visit: Payer: Self-pay

## 2020-12-10 ENCOUNTER — Encounter (HOSPITAL_COMMUNITY)
Admission: RE | Disposition: A | Discharge status: Home / Self Care | Payer: Self-pay | Source: Home / Self Care | Attending: Interventional Cardiology

## 2020-12-10 ENCOUNTER — Other Ambulatory Visit: Payer: Self-pay | Admitting: Interventional Cardiology

## 2020-12-10 DIAGNOSIS — Z9861 Coronary angioplasty status: Secondary | ICD-10-CM

## 2020-12-10 DIAGNOSIS — E119 Type 2 diabetes mellitus without complications: Secondary | ICD-10-CM | POA: Insufficient documentation

## 2020-12-10 DIAGNOSIS — I25119 Atherosclerotic heart disease of native coronary artery with unspecified angina pectoris: Secondary | ICD-10-CM | POA: Diagnosis not present

## 2020-12-10 DIAGNOSIS — I252 Old myocardial infarction: Secondary | ICD-10-CM | POA: Insufficient documentation

## 2020-12-10 DIAGNOSIS — I2510 Atherosclerotic heart disease of native coronary artery without angina pectoris: Secondary | ICD-10-CM | POA: Diagnosis not present

## 2020-12-10 DIAGNOSIS — E785 Hyperlipidemia, unspecified: Secondary | ICD-10-CM | POA: Diagnosis not present

## 2020-12-10 DIAGNOSIS — I209 Angina pectoris, unspecified: Secondary | ICD-10-CM

## 2020-12-10 DIAGNOSIS — I214 Non-ST elevation (NSTEMI) myocardial infarction: Secondary | ICD-10-CM | POA: Diagnosis present

## 2020-12-10 DIAGNOSIS — Z955 Presence of coronary angioplasty implant and graft: Secondary | ICD-10-CM | POA: Insufficient documentation

## 2020-12-10 DIAGNOSIS — I10 Essential (primary) hypertension: Secondary | ICD-10-CM | POA: Insufficient documentation

## 2020-12-10 DIAGNOSIS — I25118 Atherosclerotic heart disease of native coronary artery with other forms of angina pectoris: Secondary | ICD-10-CM

## 2020-12-10 HISTORY — PX: LEFT HEART CATH AND CORONARY ANGIOGRAPHY: CATH118249

## 2020-12-10 HISTORY — PX: CORONARY PRESSURE/FFR STUDY: CATH118243

## 2020-12-10 LAB — POCT ACTIVATED CLOTTING TIME: Activated Clotting Time: 293 seconds

## 2020-12-10 LAB — GLUCOSE, CAPILLARY: Glucose-Capillary: 86 mg/dL (ref 70–99)

## 2020-12-10 SURGERY — LEFT HEART CATH AND CORONARY ANGIOGRAPHY
Anesthesia: LOCAL

## 2020-12-10 MED ORDER — OXYCODONE HCL 5 MG PO TABS: 5.0000 mg | ORAL_TABLET | ORAL | Status: DC | PRN

## 2020-12-10 MED ORDER — SODIUM CHLORIDE 0.9 % IV SOLN: INTRAVENOUS | Status: DC

## 2020-12-10 MED ORDER — SODIUM CHLORIDE 0.9% FLUSH: 3.0000 mL | Freq: Two times a day (BID) | INTRAVENOUS | Status: DC

## 2020-12-10 MED ORDER — HEPARIN (PORCINE) IN NACL 1000-0.9 UT/500ML-% IV SOLN
INTRAVENOUS | Status: AC
  Filled 2020-12-10: qty 1000

## 2020-12-10 MED ORDER — HYDRALAZINE HCL 20 MG/ML IJ SOLN: 10.0000 mg | INTRAMUSCULAR | Status: DC | PRN

## 2020-12-10 MED ORDER — ADENOSINE 12 MG/4ML IV SOLN
INTRAVENOUS | Status: AC
  Filled 2020-12-10: qty 16

## 2020-12-10 MED ORDER — FENTANYL CITRATE (PF) 100 MCG/2ML IJ SOLN
INTRAMUSCULAR | Status: DC | PRN
  Administered 2020-12-10: 25 ug via INTRAVENOUS
  Administered 2020-12-10: 50 ug via INTRAVENOUS
  Administered 2020-12-10: 25 ug via INTRAVENOUS

## 2020-12-10 MED ORDER — HEPARIN (PORCINE) IN NACL 1000-0.9 UT/500ML-% IV SOLN
INTRAVENOUS | Status: DC | PRN
  Administered 2020-12-10 (×2): 500 mL

## 2020-12-10 MED ORDER — VERAPAMIL HCL 2.5 MG/ML IV SOLN
INTRAVENOUS | Status: DC | PRN
  Administered 2020-12-10: 10 mL via INTRA_ARTERIAL

## 2020-12-10 MED ORDER — LIDOCAINE HCL (PF) 1 % IJ SOLN
INTRAMUSCULAR | Status: DC | PRN
  Administered 2020-12-10: 2 mL

## 2020-12-10 MED ORDER — ASPIRIN 81 MG PO CHEW: 81.0000 mg | CHEWABLE_TABLET | ORAL | Status: DC

## 2020-12-10 MED ORDER — MIDAZOLAM HCL 2 MG/2ML IJ SOLN
INTRAMUSCULAR | Status: AC
  Filled 2020-12-10: qty 2

## 2020-12-10 MED ORDER — SODIUM CHLORIDE 0.9% FLUSH: 3.0000 mL | INTRAVENOUS | Status: DC | PRN

## 2020-12-10 MED ORDER — SODIUM CHLORIDE 0.9 % IV SOLN: 250.0000 mL | INTRAVENOUS | Status: DC | PRN

## 2020-12-10 MED ORDER — MIDAZOLAM HCL 2 MG/2ML IJ SOLN
INTRAMUSCULAR | Status: DC | PRN
  Administered 2020-12-10 (×2): .5 mg via INTRAVENOUS
  Administered 2020-12-10: 1 mg via INTRAVENOUS

## 2020-12-10 MED ORDER — ASPIRIN 81 MG PO CHEW: 81.0000 mg | CHEWABLE_TABLET | Freq: Every day | ORAL | Status: DC

## 2020-12-10 MED ORDER — VERAPAMIL HCL 2.5 MG/ML IV SOLN
INTRAVENOUS | Status: AC
  Filled 2020-12-10: qty 2

## 2020-12-10 MED ORDER — SODIUM CHLORIDE 0.9 % WEIGHT BASED INFUSION: 1.0000 mL/kg/h | INTRAVENOUS | Status: DC

## 2020-12-10 MED ORDER — LABETALOL HCL 5 MG/ML IV SOLN: 10.0000 mg | INTRAVENOUS | Status: DC | PRN

## 2020-12-10 MED ORDER — ONDANSETRON HCL 4 MG/2ML IJ SOLN: 4.0000 mg | Freq: Four times a day (QID) | INTRAMUSCULAR | Status: DC | PRN

## 2020-12-10 MED ORDER — SODIUM CHLORIDE 0.9 % WEIGHT BASED INFUSION
3.0000 mL/kg/h | INTRAVENOUS | Status: AC
  Administered 2020-12-10: 3 mL/kg/h via INTRAVENOUS

## 2020-12-10 MED ORDER — ACETAMINOPHEN 325 MG PO TABS: 650.0000 mg | ORAL_TABLET | ORAL | Status: DC | PRN

## 2020-12-10 MED ORDER — FENTANYL CITRATE (PF) 100 MCG/2ML IJ SOLN
INTRAMUSCULAR | Status: AC
  Filled 2020-12-10: qty 2

## 2020-12-10 MED ORDER — HEPARIN SODIUM (PORCINE) 1000 UNIT/ML IJ SOLN
INTRAMUSCULAR | Status: AC
  Filled 2020-12-10: qty 10

## 2020-12-10 MED ORDER — IOHEXOL 350 MG/ML SOLN
INTRAVENOUS | Status: DC | PRN
  Administered 2020-12-10: 120 mL

## 2020-12-10 MED ORDER — LIDOCAINE HCL (PF) 1 % IJ SOLN
INTRAMUSCULAR | Status: AC
  Filled 2020-12-10: qty 30

## 2020-12-10 MED ORDER — HEPARIN SODIUM (PORCINE) 1000 UNIT/ML IJ SOLN
INTRAMUSCULAR | Status: DC | PRN
  Administered 2020-12-10: 4500 [IU] via INTRAVENOUS
  Administered 2020-12-10: 4000 [IU] via INTRAVENOUS

## 2020-12-10 MED ORDER — ADENOSINE (DIAGNOSTIC) 140MCG/KG/MIN
INTRAVENOUS | Status: DC | PRN
  Administered 2020-12-10: 140 ug/kg/min via INTRAVENOUS

## 2020-12-10 SURGICAL SUPPLY — 13 items
CATH 5FR JL3.5 JR4 ANG PIG MP (CATHETERS) ×1 IMPLANT
CATH LAUNCHER 5F EBU3.5 (CATHETERS) ×1 IMPLANT
DEVICE RAD COMP TR BAND LRG (VASCULAR PRODUCTS) ×1 IMPLANT
GLIDESHEATH SLEND A-KIT 6F 22G (SHEATH) ×1 IMPLANT
GUIDEWIRE INQWIRE 1.5J.035X260 (WIRE) IMPLANT
GUIDEWIRE PRESSURE X 175 (WIRE) ×1 IMPLANT
INQWIRE 1.5J .035X260CM (WIRE) ×2
KIT HEART LEFT (KITS) ×2 IMPLANT
KIT HEMO VALVE WATCHDOG (MISCELLANEOUS) ×1 IMPLANT
PACK CARDIAC CATHETERIZATION (CUSTOM PROCEDURE TRAY) ×2 IMPLANT
SHEATH PROBE COVER 6X72 (BAG) ×1 IMPLANT
TRANSDUCER W/STOPCOCK (MISCELLANEOUS) ×2 IMPLANT
TUBING CIL FLEX 10 FLL-RA (TUBING) ×2 IMPLANT

## 2020-12-10 NOTE — Interval H&P Note (Signed)
Cath Lab Visit (complete for each Cath Lab visit)  Clinical Evaluation Leading to the Procedure:   ACS: No.  Non-ACS:    Anginal Classification: CCS III  Anti-ischemic medical therapy: Maximal Therapy (2 or more classes of medications)  Non-Invasive Test Results: No non-invasive testing performed  Prior CABG: No previous CABG      History and Physical Interval Note:  12/10/2020 1:17 PM  Dylan Lane  has presented today for surgery, with the diagnosis of angina.  The various methods of treatment have been discussed with the patient and family. After consideration of risks, benefits and other options for treatment, the patient has consented to  Procedure(s): LEFT HEART CATH AND CORONARY ANGIOGRAPHY (N/A) as a surgical intervention.  The patient's history has been reviewed, patient examined, no change in status, stable for surgery.  I have reviewed the patient's chart and labs.  Questions were answered to the patient's satisfaction.     Belva Crome III

## 2020-12-11 ENCOUNTER — Encounter (HOSPITAL_COMMUNITY): Payer: Self-pay | Admitting: Interventional Cardiology

## 2020-12-11 ENCOUNTER — Telehealth: Payer: Self-pay | Admitting: Interventional Cardiology

## 2020-12-11 ENCOUNTER — Telehealth: Payer: Self-pay | Admitting: Pharmacist

## 2020-12-11 MED ORDER — NITROGLYCERIN 0.4 MG SL SUBL: SUBLINGUAL_TABLET | SUBLINGUAL | 1 refills | Status: DC

## 2020-12-11 MED ORDER — ATORVASTATIN CALCIUM 80 MG PO TABS: 80.0000 mg | ORAL_TABLET | Freq: Every day | ORAL | 3 refills | Status: DC

## 2020-12-11 NOTE — Chronic Care Management (AMB) (Addendum)
    Chronic Care Management Pharmacy Assistant   Name: Dylan Lane  MRN: [id number] DOB: [birth year]  Reason for Encounter: Medication Review / Medication Coordination Call   Conditions to be addressed/monitored: HTN  Recent office visits:  None  Recent consult visits:  12/18/2020 Jenkins Rouge (cardiology) - Patient was seen for Angina pectoris and additional issues. No medication changes. Follow up as scheduled  Hospital visits:  None  Medications: Outpatient Encounter Medications as of 12/11/2020  Medication Sig   alfuzosin (UROXATRAL) 10 MG 24 hr tablet Take 10 mg by mouth daily.   amLODipine (NORVASC) 10 MG tablet Take 1 tablet (10 mg total) by mouth daily.   aspirin 81 MG EC tablet Take 1 tablet (81 mg total) by mouth daily.   atorvastatin (LIPITOR) 80 MG tablet Take 1 tablet (80 mg total) by mouth daily.   clopidogrel (PLAVIX) 75 MG tablet Take 1 tablet (75 mg total) by mouth daily.   cyclobenzaprine (FLEXERIL) 10 MG tablet Take 1 tablet (10 mg total) by mouth 3 (three) times daily as needed for muscle spasms.   metFORMIN (GLUCOPHAGE-XR) 500 MG 24 hr tablet Take 1 tablet by mouth once daily with breakfast   methylPREDNISolone (MEDROL DOSEPAK) 4 MG TBPK tablet Take as directed (Patient not taking: Reported on 12/08/2020)   metoprolol succinate (TOPROL XL) 25 MG 24 hr tablet Take 1 tablet (25 mg total) by mouth daily.   nitroGLYCERIN (NITROSTAT) 0.4 MG SL tablet DISSOLVE ONE TABLET UNDER THE TONGUE EVERY 5 MINUTES AS NEEDED FOR CHEST PAIN.  DO NOT EXCEED A TOTAL OF 3 DOSES IN 15 MINUTES   pantoprazole (PROTONIX) 40 MG tablet Take 1 tablet (40 mg total) by mouth daily.   Facility-Administered Encounter Medications as of 12/11/2020  Medication   sodium chloride flush (NS) 0.9 % injection 3 mL   Patient obtains medications through Vials  30 Days   Last adherence delivery included:  Amlodipine 10mg  - one tablet with breakfast Pantoprazole 40mg  - one tablet with  breakfast Atorvastatin 80mg  - one tablet with breakfast Metoprolol succinate 25mg  - daily with breakfast Alfuzosin ER 10mg  - take daily at breakfast Plavix 75 mg 1 tablet daily  Patient declined (meds) last month:None  Patient is due for next adherence delivery on: 12/24/2020.  Called patient and reviewed medications and coordinated delivery.  This delivery to include: Amlodipine 10mg  - one tablet with breakfast Pantoprazole 40mg  - one tablet with breakfast Atorvastatin 80mg  - one tablet with breakfast Metoprolol succinate 25mg  - daily with breakfast Alfuzosin ER 10mg  - take daily at breakfast   Patient will need a short fill:None  Coordinated acute fill: Nitroglycerin, Dr. Kennon Holter office to be sending in to [facility] today, patient spoke with Dr. Kennon Holter office to request refill.  Patient declined the following medications: Plavix, he has plenty on hand  Confirmed delivery date of 12/24/2020, advised patient that pharmacy will contact them the morning of delivery.  Care Gaps: AWV -  completed on 04/20/20. COVID-19 - overdue Last BP - 120/70 on 11/03/2020  Star Rating Drugs: Atorvastatin 80mg  - last filled on 11/17/2020 20DS at [facility] Metformin 500mg   - last filled on 10/14/20 90DS at [facility] [phone]  Requested refill of metformin to be sent to [facility] to line up with other medications.  Jeni Salles, PharmD, [place] Pharmacist [place] [facility] [place] [place]

## 2020-12-11 NOTE — Telephone Encounter (Signed)
Returned call to Pt.  Discussed Pt's heart cath results in great detail.  Pt's many questions answered.  Sent refill for nitro and statin to [facility] pharmacy as requested.  Pt asking if there are any dietary programs available?  Will check with primary cards nurse.

## 2020-12-11 NOTE — Progress Notes (Signed)
Pt called asking about his results from his cath. Stated he was "woozy" while the MD spoke with him yesterday. Informed him they were looking at his LAD with the extra wire (FFR) but no flow obstruction was found. Instructed him any medication changes were done yesterday or will be done in the office. He asked about diet. Instructed him on heart healthy diet. Pt appreciative.

## 2020-12-11 NOTE — Telephone Encounter (Signed)
Called pt and advised we didn't have a strictly cardiac specific dietary program but [facility] does have a Nutritionist.  Offered to send him heart healthy diet information and then if he wants to see Nutritionist, we can talk about a referral.  Pt agreeable.  Pt asked about when he can go back to working?  He's a Art gallery manager.  Advised I will send to Dr. Tamala Julian to see when he can return to work.

## 2020-12-11 NOTE — Telephone Encounter (Signed)
  Per answering service message:  Patient would like a call from nurse to explain what was found on heart cath he had done on Thursday. He was a little loopy after the procedure. Are there any diet restrictions?

## 2020-12-15 NOTE — Telephone Encounter (Signed)
Okay to return to work 48 hours after cath.  ----- Message -----  From: Loren Racer, RN  Sent: 12/11/2020   1:02 PM EST  To: Belva Crome, MD, Loren Racer, RN   Called pt and left detailed message with information from Dr. Tamala Julian.  Advised to call back if any questions.

## 2020-12-17 ENCOUNTER — Other Ambulatory Visit: Payer: Self-pay

## 2020-12-17 MED ORDER — METFORMIN HCL ER 500 MG PO TB24: 500.0000 mg | ORAL_TABLET | Freq: Every day | ORAL | 1 refills | Status: DC

## 2021-01-04 DIAGNOSIS — H524 Presbyopia: Secondary | ICD-10-CM | POA: Diagnosis not present

## 2021-01-09 ENCOUNTER — Encounter (HOSPITAL_COMMUNITY): Payer: Self-pay

## 2021-01-09 ENCOUNTER — Emergency Department (HOSPITAL_COMMUNITY)
Admission: EM | Admit: 2021-01-09 | Discharge: 2021-01-09 | Disposition: A | Discharge status: Home / Self Care | Payer: Medicare HMO | Attending: Emergency Medicine | Admitting: Emergency Medicine

## 2021-01-09 ENCOUNTER — Other Ambulatory Visit: Payer: Self-pay

## 2021-01-09 DIAGNOSIS — Z7982 Long term (current) use of aspirin: Secondary | ICD-10-CM | POA: Insufficient documentation

## 2021-01-09 DIAGNOSIS — U071 COVID-19: Secondary | ICD-10-CM | POA: Diagnosis not present

## 2021-01-09 DIAGNOSIS — Z7902 Long term (current) use of antithrombotics/antiplatelets: Secondary | ICD-10-CM | POA: Diagnosis not present

## 2021-01-09 DIAGNOSIS — I10 Essential (primary) hypertension: Secondary | ICD-10-CM | POA: Diagnosis not present

## 2021-01-09 DIAGNOSIS — Z79899 Other long term (current) drug therapy: Secondary | ICD-10-CM | POA: Insufficient documentation

## 2021-01-09 DIAGNOSIS — R059 Cough, unspecified: Secondary | ICD-10-CM | POA: Diagnosis not present

## 2021-01-09 MED ORDER — NIRMATRELVIR/RITONAVIR (PAXLOVID)TABLET
3.0000 | ORAL_TABLET | Freq: Two times a day (BID) | ORAL | 0 refills | Status: AC
Start: 2021-01-09 — End: 2021-01-14

## 2021-01-09 NOTE — ED Provider Notes (Signed)
[facility] EMERGENCY DEPARTMENT Provider Note   CSN: [id number] Arrival date & time: 01/09/21  0263     History  No chief complaint on file.   Dylan Lane is a 71 y.o. male.  Patient with history of hypertension and NSTEMI presents today with cough, congestion, and rhinorrhea.  He states that same began 2 days ago.  He took a home COVID test which was positive.  He denies fevers, chills, chest pain, shortness of breath.    The history is provided by the patient. No language interpreter was used.      Home Medications Prior to Admission medications   Medication Sig Start Date End Date Taking? Authorizing Provider  alfuzosin (UROXATRAL) 10 MG 24 hr tablet Take 10 mg by mouth daily. 04/19/20   [provider]  amLODipine (NORVASC) 10 MG tablet Take 1 tablet (10 mg total) by mouth daily. 10/13/20   Nafziger, Tommi Rumps, NP  aspirin 81 MG EC tablet Take 1 tablet (81 mg total) by mouth daily. 12/20/13   Jessee Avers, MD  atorvastatin (LIPITOR) 80 MG tablet Take 1 tablet (80 mg total) by mouth daily. 12/11/20   Belva Crome, MD  clopidogrel (PLAVIX) 75 MG tablet Take 1 tablet (75 mg total) by mouth daily. 06/04/20   Belva Crome, MD  cyclobenzaprine (FLEXERIL) 10 MG tablet Take 1 tablet (10 mg total) by mouth 3 (three) times daily as needed for muscle spasms. 10/13/20   Nafziger, Tommi Rumps, NP  metFORMIN (GLUCOPHAGE-XR) 500 MG 24 hr tablet Take 1 tablet (500 mg total) by mouth daily with breakfast. 12/17/20   Nafziger, Tommi Rumps, NP  methylPREDNISolone (MEDROL DOSEPAK) 4 MG TBPK tablet Take as directed Patient not taking: Reported on 12/08/2020 10/13/20   [name], NP  metoprolol succinate (TOPROL XL) 25 MG 24 hr tablet Take 1 tablet (25 mg total) by mouth daily. 06/08/20   Belva Crome, MD  nitroGLYCERIN (NITROSTAT) 0.4 MG SL tablet DISSOLVE ONE TABLET UNDER THE TONGUE EVERY 5 MINUTES AS NEEDED FOR CHEST PAIN.  DO NOT EXCEED A TOTAL OF 3 DOSES IN 15 MINUTES  12/11/20   Belva Crome, MD  pantoprazole (PROTONIX) 40 MG tablet Take 1 tablet (40 mg total) by mouth daily. 05/28/20   [name], [name], MD      Allergies    Amoxicillin    Review of Systems   Review of Systems  Constitutional:  Negative for chills and fever.  HENT:  Positive for congestion and rhinorrhea.   Respiratory:  Negative for cough, shortness of breath, wheezing and stridor.   Cardiovascular:  Negative for chest pain.  Gastrointestinal:  Negative for diarrhea, nausea and vomiting.  All other systems reviewed and are negative.  Physical Exam Updated Vital Signs BP 119/84    Pulse 83    Temp 98.6 F (37 C) (Oral)    Resp 18    SpO2 97%  Physical Exam Vitals and nursing note reviewed.  Constitutional:      General: He is not in acute distress.    Appearance: Normal appearance. He is normal weight. He is not ill-appearing, toxic-appearing or diaphoretic.     Comments: Resting comfortably in chair in no acute distress  HENT:     Nose: Nose normal.     Mouth/Throat:     Mouth: Mucous membranes are moist.  Cardiovascular:     Rate and Rhythm: Normal rate and regular rhythm.     Heart sounds: Normal heart sounds.  Pulmonary:  Effort: Pulmonary effort is normal. No respiratory distress.     Breath sounds: Normal breath sounds. No stridor. No wheezing, rhonchi or rales.  Abdominal:     General: Abdomen is flat. There is no distension.     Palpations: Abdomen is soft.     Tenderness: There is no abdominal tenderness. There is no guarding.  Skin:    General: Skin is warm and dry.  Neurological:     Mental Status: He is alert.    ED Results / Procedures / Treatments   Labs (all labs ordered are listed, but only abnormal results are displayed) Labs Reviewed - No data to display  EKG None  Radiology No results found.  Procedures Procedures    Medications Ordered in ED Medications - No data to display  ED Course/ Medical Decision Making/ A&P                            Medical Decision Making  Patient presents today with 2 days of cough, congestion, and rhinorrhea.  Positive home COVID test.  States that he came in today with general concern given COVID diagnosis.  States "I just did not know what to do".  His lungs are clear to auscultation in all fields.  He is afebrile, nontoxic-appearing, and in no acute distress.  No imaging or labs indicated at this time.  Pt will be discharged with symptomatic treatment.  Verbalizes understanding and is agreeable with plan. Pt is hemodynamically stable & in NAD prior to dc.  Educated on red flag symptoms of prompt immediate return.  Discharged in stable condition.   Final Clinical Impression(s) / ED Diagnoses Final diagnoses:  COVID    Rx / DC Orders ED Discharge Orders          Ordered    nirmatrelvir/ritonavir EUA (PAXLOVID) 20 x 150 MG & 10 x 100MG  TABS  2 times daily        01/09/21 1028          An After Visit Summary was printed and given to the patient.     Nestor Lewandowsky 01/09/21 1030    Varney Biles, MD 01/10/21 1122

## 2021-01-09 NOTE — ED Triage Notes (Signed)
Patient complains of runny nose x 2 days and positive home covid , NAD

## 2021-01-09 NOTE — Discharge Instructions (Addendum)
You are exam in the ER today was reassuring.  Given your positive at-home COVID test, I have prescribed you Paxlovid located which is the antiviral for COVID.  Please take this as prescribed.   The CDC recommends that you quarantine for 5 days following positive COVID COVID test with 5 days of mask wearing after that.  I recommend that he follow these guidelines.  Return if development of any new or worsening symptoms.

## 2021-01-11 ENCOUNTER — Telehealth: Payer: Self-pay | Admitting: Interventional Cardiology

## 2021-01-11 NOTE — Telephone Encounter (Signed)
New Message:     Patient says he have Covid.He wants to know if it is alright that he take Paxlovid along with other medicine and with his condition?

## 2021-01-11 NOTE — Telephone Encounter (Signed)
Will forward this message to our PharmD team, to further review and advise if taking Paxlovid is safe to take with his other cardiac meds/history.  Nursing to follow-up with the pt accordingly thereafter.

## 2021-01-11 NOTE — Telephone Encounter (Signed)
Paxlovid interacts with many of his medications.  Alfuzosin is contraindicated with Paxlovid and would need to be discontinued while he is on Paxlovid.   Paxlovid also decreases concentrations of his Plavix and makes it less effective so the two are not ideal to use together.   Paxlovid can also increase concentrations of his atorvastatin and he should hold atorvastatin while on Paxlovid.   Paxlovid increases concentrations of his amlodipine which could lower his blood pressure. Would recommend cutting amlodipine tablets in half and taking 1/2 tab daily while on Paxlovid.  Molnupiravir would be preferred for COVID treatment to avoid all of the above drug interactions.

## 2021-01-11 NOTE — Telephone Encounter (Signed)
Spoke with patient and gave him recommendations from PharmD. Patient states that he did already start Paxlovid on Saturday. Advised him that if he is going to continue taking he needs to hold atorvastatin and alfuzosin and cut amlodipine in half. Patient verbalized understanding.

## 2021-01-13 ENCOUNTER — Encounter: Payer: Self-pay | Admitting: Adult Health

## 2021-01-13 ENCOUNTER — Telehealth: Payer: Self-pay | Admitting: Pharmacist

## 2021-01-13 ENCOUNTER — Other Ambulatory Visit: Payer: Self-pay

## 2021-01-13 ENCOUNTER — Telehealth (INDEPENDENT_AMBULATORY_CARE_PROVIDER_SITE_OTHER): Payer: Medicare HMO | Admitting: Adult Health

## 2021-01-13 VITALS — Ht 67.0 in | Wt 205.0 lb

## 2021-01-13 DIAGNOSIS — U071 COVID-19: Secondary | ICD-10-CM

## 2021-01-13 NOTE — Chronic Care Management (AMB) (Addendum)
Chronic Care Management Pharmacy Assistant   Name: Dylan Lane  MRN: [id number] DOB: 10-15-1950.  Reason for Encounter: Medication Review  Medication Coordination Call   Conditions to be addressed/monitored: HTN  Recent office visits:  None  Recent consult visits:  None  Hospital visits:  Patient was seen at [facility] ED on 01/09/2021 due to Covid. Discharged after 35 minutes.  New?Medications Started at [facility] Discharge:?? -started on Paxlovid 20 x 150 MG & 10 x 100MG  TABS Medication Changes at Hospital Discharge: -No medication changes Medications Discontinued at Hospital Discharge: -No medications stopped Medications that remain the same after Hospital Discharge:??  -All other medications will remain the same.    Medications: Outpatient Encounter Medications as of 01/13/2021  Medication Sig   alfuzosin (UROXATRAL) 10 MG 24 hr tablet Take 10 mg by mouth daily.   amLODipine (NORVASC) 10 MG tablet Take 1 tablet (10 mg total) by mouth daily.   aspirin 81 MG EC tablet Take 1 tablet (81 mg total) by mouth daily.   atorvastatin (LIPITOR) 80 MG tablet Take 1 tablet (80 mg total) by mouth daily.   clopidogrel (PLAVIX) 75 MG tablet Take 1 tablet (75 mg total) by mouth daily.   cyclobenzaprine (FLEXERIL) 10 MG tablet Take 1 tablet (10 mg total) by mouth 3 (three) times daily as needed for muscle spasms.   metFORMIN (GLUCOPHAGE-XR) 500 MG 24 hr tablet Take 1 tablet (500 mg total) by mouth daily with breakfast.   methylPREDNISolone (MEDROL DOSEPAK) 4 MG TBPK tablet Take as directed   metoprolol succinate (TOPROL XL) 25 MG 24 hr tablet Take 1 tablet (25 mg total) by mouth daily.   nirmatrelvir/ritonavir EUA (PAXLOVID) 20 x 150 MG & 10 x 100MG  TABS Take 3 tablets by mouth 2 (two) times daily for 5 days. Patient GFR is 76. Take nirmatrelvir (150 mg) two tablets twice daily for 5 days and ritonavir (100 mg) one tablet twice daily for 5 days.   nitroGLYCERIN  (NITROSTAT) 0.4 MG SL tablet DISSOLVE ONE TABLET UNDER THE TONGUE EVERY 5 MINUTES AS NEEDED FOR CHEST PAIN.  DO NOT EXCEED A TOTAL OF 3 DOSES IN 15 MINUTES   pantoprazole (PROTONIX) 40 MG tablet Take 1 tablet (40 mg total) by mouth daily.   Facility-Administered Encounter Medications as of 01/13/2021  Medication   sodium chloride flush (NS) 0.9 % injection 3 mL  Reviewed chart for medication changes ahead of medication coordination call.  No OVs, Consults, or hospital visits since last care coordination call/Pharmacist visit. (If appropriate, list visit date, provider name)  No medication changes indicated OR if recent visit, treatment plan here.  BP Readings from Last 3 Encounters:  01/09/21 119/84  12/10/20 105/70  12/08/20 104/72    Lab Results  Component Value Date   HGBA1C 6.5 07/16/2020     Patient obtains medications through Vials  30 Days    Last adherence delivery included:  Amlodipine 10mg  - one tablet with breakfast Pantoprazole 40mg  - one tablet with breakfast Atorvastatin 80mg  - one tablet with breakfast Metoprolol succinate 25mg  - daily with breakfast Alfuzosin ER 10mg  - take daily at breakfast   Patient declined (meds) last month:Plavix, has plenty on hand   Patient is due for next adherence delivery on: 01/25/2021.   Called patient and reviewed medications and coordinated delivery.   This delivery to include: Amlodipine 10mg  - one tablet with breakfast Pantoprazole 40mg  - one tablet with breakfast Atorvastatin 80mg  - one tablet with breakfast Metoprolol succinate  25mg  - daily with breakfast Alfuzosin ER 10mg  - take daily at breakfast Metformin ER 500mg  - take 1 tablet daily Clopidogrel 75 mg - take 1 tablet daily   Patient will need a short fill:None   Coordinated acute fill: None   Patient declined the following medications: Nitroglycerin, he has plenty on hand   Confirmed delivery date of 01/25/2021, advised patient that pharmacy will contact them  the morning of delivery.   Care Gaps: AWV -  completed on 04/20/20. COVID-19 - overdue Last BP - 119/84 on 01/09/2021 Last A1C - 6.5 on 07/16/2020  Star Rating Drugs: Atorvastatin 80mg  - last filled on 12/24/2020 20DS at [facility] Metformin 500mg   - last filled on 10/14/20 90DS at [place] [phone]

## 2021-01-13 NOTE — Progress Notes (Signed)
Virtual Visit via Telephone Note  I connected with [name] on 01/13/21 at  9:30 AM EST by telephone and verified that I am speaking with the correct person using two identifiers.   I discussed the limitations, risks, security and privacy concerns of performing an evaluation and management service by telephone and the availability of in person appointments. I also discussed with the patient that there may be a patient responsible charge related to this service. The patient expressed understanding and agreed to proceed.  Location patient: home Location provider: work or home office Participants present for the call: patient, provider Patient did not have a visit in the prior 7 days to address this/these issue(s).   History of Present Illness: 71 year old male who  has a past medical history of Arthritis, CAD (coronary artery disease), native coronary artery, Colon polyps, GERD (gastroesophageal reflux disease) (2017), Hyperlipidemia, Hypertension, Myocardial infarction ([place]), Pre-diabetes, Rectal bleeding, and Scoliosis.  He was seen in the ER 4 days ago after he tested positive for COVID 19. His symptoms were congestion, fatigue, and rhinorrhea. He was started on Paxlovid. He continues to take Paxlovid and feels much improved but continues to feel slightly fatigued.   He is wondering if there is anything he needs to do further.    Observations/Objective: Patient sounds cheerful and well on the phone. I do not appreciate any SOB. Speech and thought processing are grossly intact. Patient reported vitals:  Assessment and Plan: 1. COVID-19 virus infection - Finish Paxlovid  - Stay hydrated and rest - Reassurance given. His symptoms should resolve completely.    Follow Up Instructions:  I did not refer this patient for an OV in the next 24 hours for this/these issue(s).  I discussed the assessment and treatment plan with the patient. The patient was provided an opportunity  to ask questions and all were answered. The patient agreed with the plan and demonstrated an understanding of the instructions.   The patient was advised to call back or seek an in-person evaluation if the symptoms worsen or if the condition fails to improve as anticipated.  I provided 22 minutes of non-face-to-face time during this encounter.   [name], NP

## 2021-02-11 ENCOUNTER — Telehealth: Payer: Self-pay | Admitting: Pharmacist

## 2021-02-11 NOTE — Chronic Care Management (AMB) (Signed)
Chronic Care Management Pharmacy Assistant   Name: Dylan Lane  MRN: [id number] DOB: Jul 21, 1950  Reason for Encounter: Medication Review / Medication Coordination Call   Conditions to be addressed/monitored: HTN   Recent office visits:  None  Recent consult visits:  None  Hospital visits:  None  Medications: Outpatient Encounter Medications as of 02/11/2021  Medication Sig   alfuzosin (UROXATRAL) 10 MG 24 hr tablet Take 10 mg by mouth daily.   amLODipine (NORVASC) 10 MG tablet Take 1 tablet (10 mg total) by mouth daily.   aspirin 81 MG EC tablet Take 1 tablet (81 mg total) by mouth daily.   atorvastatin (LIPITOR) 80 MG tablet Take 1 tablet (80 mg total) by mouth daily.   clopidogrel (PLAVIX) 75 MG tablet Take 1 tablet (75 mg total) by mouth daily.   cyclobenzaprine (FLEXERIL) 10 MG tablet Take 1 tablet (10 mg total) by mouth 3 (three) times daily as needed for muscle spasms.   metFORMIN (GLUCOPHAGE-XR) 500 MG 24 hr tablet Take 1 tablet (500 mg total) by mouth daily with breakfast.   methylPREDNISolone (MEDROL DOSEPAK) 4 MG TBPK tablet Take as directed   metoprolol succinate (TOPROL XL) 25 MG 24 hr tablet Take 1 tablet (25 mg total) by mouth daily.   nitroGLYCERIN (NITROSTAT) 0.4 MG SL tablet DISSOLVE ONE TABLET UNDER THE TONGUE EVERY 5 MINUTES AS NEEDED FOR CHEST PAIN.  DO NOT EXCEED A TOTAL OF 3 DOSES IN 15 MINUTES   pantoprazole (PROTONIX) 40 MG tablet Take 1 tablet (40 mg total) by mouth daily.   Facility-Administered Encounter Medications as of 02/11/2021  Medication   sodium chloride flush (NS) 0.9 % injection 3 mL  Reviewed chart for medication changes ahead of medication coordination call.  No OVs, Consults, or hospital visits since last care coordination call/Pharmacist visit. (If appropriate, list visit date, provider name)  No medication changes indicated OR if recent visit, treatment plan here.  BP Readings from Last 3 Encounters:  01/09/21 119/84   12/10/20 105/70  12/08/20 104/72    Lab Results  Component Value Date   HGBA1C 6.5 07/16/2020     Patient obtains medications through Vials  30 Days    Last adherence delivery included:  Amlodipine 10mg  - one tablet with breakfast Pantoprazole 40mg  - one tablet with breakfast Atorvastatin 80mg  - one tablet with breakfast Metoprolol succinate 25mg  - daily with breakfast Alfuzosin ER 10mg  - take daily at breakfast Metformin ER 500mg  - take 1 tablet daily Clopidogrel 75 mg - take 1 tablet daily   Patient declined (meds) last month:Nitroglycerin, he has plenty on hand   Patient is due for next adherence delivery on: 02/23/2021    Called patient and reviewed medications and coordinated delivery.   This delivery to include: Amlodipine 10mg  - 1 tablet with breakfast Pantoprazole 40mg  - 1 tablet with breakfast Atorvastatin 80mg  - 1 tablet with breakfast Metoprolol succinate 25mg  - 1 tablet with breakfast Alfuzosin ER 10mg  - 1 tablet with breakfast Metformin ER 500mg  - 1 tablet with breakfast Clopidogrel 75 mg - 1 tablet daily Nitroglycerin 0.4 mg - take 1 tablet under the tongue every 5 minutes as needed for chest pain   Patient will need a short fill: No short fills needed   Coordinated acute fill: No acute fills needed   Patient declined the following medications: No medications declined   Confirmed delivery date of 02/23/2021, advised patient that pharmacy will contact them the morning of delivery.   Care Gaps: AWV -  completed on 04/20/20. Last BP - 119/84 on 01/09/2021 Last A1C - 6.5 on 07/16/2020 [facility] - never done Covid booster - overdue  Star Rating Drugs: Atorvastatin 80mg  - last filled on 01/18/2021 30DS at [facility] Metformin 500mg   - last filled on 01/18/2021 30DS at [place] [phone]

## 2021-02-19 ENCOUNTER — Telehealth: Payer: Self-pay | Admitting: Pharmacist

## 2021-02-19 NOTE — Chronic Care Management (AMB) (Signed)
° ° °  Chronic Care Management Pharmacy Assistant   Name: Dylan Lane  MRN: [id number] DOB: 1950-05-26  02/23/2021 APPOINTMENT REMINDER  Dylan Lane was reminded to have all medications, supplements and any blood glucose and blood pressure readings available for review with [name]. D, at his office visit on 02/23/2021 at 1:30.   Questions: Have you had any recent office visit or specialist visit outside of [facility]? Patient denies any visits outside of [facility]  Are there any concerns you would like to discuss during your office visit? Patient denies any concerns at this time.   Are you having any problems obtaining your medications? (Whether it pharmacy issues or cost) Patient would like to discuss the cost of his medications. [facility] cost is higher than it was at [employer]. He likes [facility] services however, he needs to address the cost.   If patient has any PAP medications ask if they are having any problems getting their PAP medication or refill? Patient denies any medications coming from PAP  Care Gaps: AWV -  completed on 04/20/20. Last BP - 119/84 on 01/09/2021 Last A1C - 6.5 on 07/16/2020 [facility] - never done Covid booster - overdue  Star Rating Drug: Atorvastatin 80mg  - last filled on 01/18/2021 30DS at [facility] Metformin 500mg   - last filled on 01/18/2021 30DS at [facility]  Any gaps in medications fill history?  [place] Pharmacist Assistant [phone]
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
## 2021-02-22 ENCOUNTER — Other Ambulatory Visit: Payer: Self-pay

## 2021-02-22 ENCOUNTER — Encounter: Payer: Self-pay | Admitting: Interventional Cardiology

## 2021-02-22 ENCOUNTER — Ambulatory Visit: Payer: Medicare HMO | Admitting: Interventional Cardiology

## 2021-02-22 VITALS — BP 112/68 | HR 79 | Ht 67.0 in | Wt 192.6 lb

## 2021-02-22 DIAGNOSIS — I10 Essential (primary) hypertension: Secondary | ICD-10-CM | POA: Diagnosis not present

## 2021-02-22 DIAGNOSIS — I209 Angina pectoris, unspecified: Secondary | ICD-10-CM

## 2021-02-22 DIAGNOSIS — R7303 Prediabetes: Secondary | ICD-10-CM | POA: Diagnosis not present

## 2021-02-22 DIAGNOSIS — E782 Mixed hyperlipidemia: Secondary | ICD-10-CM | POA: Diagnosis not present

## 2021-02-22 NOTE — Patient Instructions (Signed)
Medication Instructions:  Your physician recommends that you continue on your current medications as directed. Please refer to the Current Medication list given to you today. *If you need a refill on your cardiac medications before your next appointment, please call your pharmacy*   Lab Work: NONE If you have labs (blood work) drawn today and your tests are completely normal, you will receive your results only by: [place] (if you have MyChart) OR A paper copy in the mail If you have any lab test that is abnormal or we need to change your treatment, we will call you to review the results.   Testing/Procedures: NONE   Follow-Up: At [facility], you and your health needs are our priority.  As part of our continuing mission to provide you with exceptional heart care, we have created designated Provider Care Teams.  These Care Teams include your primary Cardiologist (physician) and Advanced Practice Providers (APPs -  Physician Assistants and Nurse Practitioners) who all work together to provide you with the care you need, when you need it.  We recommend signing up for the patient portal called "MyChart".  Sign up information is provided on this After Visit Summary.  MyChart is used to connect with patients for Virtual Visits (Telemedicine).  Patients are able to view lab/test results, encounter notes, upcoming appointments, etc.  Non-urgent messages can be sent to your provider as well.   To learn more about what you can do with MyChart, go to [email or web address].    Your next appointment:   9 month(s)  The format for your next appointment:   In Person  Provider:   Sinclair Grooms, MD

## 2021-02-23 ENCOUNTER — Ambulatory Visit: Payer: Medicare HMO

## 2021-02-23 ENCOUNTER — Telehealth: Payer: Self-pay | Admitting: Pharmacist

## 2021-02-23 NOTE — Chronic Care Management (AMB) (Signed)
° ° °  Chronic Care Management Pharmacy Assistant   Name: Odel Schmid  MRN: [id number] DOB: 03/13/1950  Reason for Encounter: Reschedule today's appointment.  Rescheduled with patient to 04/12/2021. [place] Pharmacist Assistant [phone]

## 2021-02-27 ENCOUNTER — Other Ambulatory Visit: Payer: Self-pay | Admitting: Interventional Cardiology

## 2021-03-09 ENCOUNTER — Other Ambulatory Visit: Payer: Self-pay | Admitting: Interventional Cardiology

## 2021-03-15 ENCOUNTER — Telehealth: Payer: Self-pay | Admitting: Pharmacist

## 2021-03-15 NOTE — Chronic Care Management (AMB) (Signed)
? ? ?Chronic Care Management ?Pharmacy Assistant  ? ?Name: Dylan Lane  MRN: [id number] DOB: May 23, 1950 ? ?Reason for Encounter: Medication Review / Medication Coordination Call  ?  ?Conditions to be addressed/monitored: ?HTN ? ?Recent office visits:  ?None ? ?Recent consult visits:  ?None ? ?Hospital visits:  ?None ? ?Medications: ?Outpatient Encounter Medications as of 03/15/2021  ?Medication Sig  ? alfuzosin (UROXATRAL) 10 MG 24 hr tablet Take 10 mg by mouth daily.  ? amLODipine (NORVASC) 10 MG tablet Take 1 tablet (10 mg total) by mouth daily.  ? aspirin 81 MG EC tablet Take 1 tablet (81 mg total) by mouth daily.  ? atorvastatin (LIPITOR) 80 MG tablet Take 1 tablet (80 mg total) by mouth daily.  ? clopidogrel (PLAVIX) 75 MG tablet Take 1 tablet (75 mg total) by mouth daily.  ? cyclobenzaprine (FLEXERIL) 10 MG tablet Take 1 tablet (10 mg total) by mouth 3 (three) times daily as needed for muscle spasms.  ? metFORMIN (GLUCOPHAGE-XR) 500 MG 24 hr tablet Take 1 tablet (500 mg total) by mouth daily with breakfast.  ? methylPREDNISolone (MEDROL DOSEPAK) 4 MG TBPK tablet Take as directed  ? metoprolol succinate (TOPROL-XL) 25 MG 24 hr tablet TAKE ONE TABLET BY MOUTH ONCE DAILY  ? nitroGLYCERIN (NITROSTAT) 0.4 MG SL tablet DISSOLVE 1 TABLET UNDER THE TONGUE EVERY 5 MINUTES AS NEEDED FOR CHEST PAIN. DO NOT EXCEED A TOTAL OF 3 DOSES IN 15 MINUTES.  ? pantoprazole (PROTONIX) 40 MG tablet Take 1 tablet (40 mg total) by mouth daily.  ? ?Facility-Administered Encounter Medications as of 03/15/2021  ?Medication  ? sodium chloride flush (NS) 0.9 % injection 3 mL  ?Reviewed chart for medication changes ahead of medication coordination call. ? ?No OVs, Consults, or hospital visits since last care coordination call/Pharmacist visit. (If appropriate, list visit date, provider name) ? ?No medication changes indicated OR if recent visit, treatment plan here. ? ?BP Readings from Last 3 Encounters:  ?02/22/21 112/68  ?01/09/21  119/84  ?12/10/20 105/70  ?  ?Lab Results  ?Component Value Date  ? HGBA1C 6.5 07/16/2020  ?  ? ?Patient obtains medications through Vials  30 Days  ?  ?Last adherence delivery included:  ?Amlodipine '10mg'$  - 1 tablet with breakfast ?Pantoprazole '40mg'$  - 1 tablet with breakfast ?Atorvastatin '80mg'$  - 1 tablet with breakfast ?Metoprolol succinate '25mg'$  - 1 tablet with breakfast ?Alfuzosin ER '10mg'$  - 1 tablet with breakfast ?Metformin ER '500mg'$  - 1 tablet with breakfast ?Clopidogrel 75 mg - 1 tablet daily ?Nitroglycerin 0.4 mg - take 1 tablet under the tongue every 5 minutes as needed for chest pain ?  ?Patient declined (meds) last month: No medications declined ?  ?Patient is due for next adherence delivery on: 03/25/2021 ?  ?  ?Called patient and reviewed medications and coordinated delivery. ?  ?This delivery to include: ?Amlodipine '10mg'$  - 1 tablet with breakfast ?Pantoprazole '40mg'$  - 1 tablet with breakfast ?Atorvastatin '80mg'$  - 1 tablet with breakfast ?Metoprolol succinate '25mg'$  - 1 tablet with breakfast ?Alfuzosin ER '10mg'$  - 1 tablet with breakfast ?Metformin ER 500 mg - 1 tablet with breakfast ?Clopidogrel 75 mg - 1 tablet daily ? ?  ?Patient will need a short fill: No short fills needed ?  ?Coordinated acute fill: No acute fills needed ?  ?Patient declined the following medications: He has plenty on hand ? Nitroglycerin 0.4 mg - take 1 tablet under the tongue every 5 minutes as needed for chest pain ? ?Confirmed delivery date of  03/25/2021, advised patient that pharmacy will contact them the morning of delivery. ?  ?  ?Care Gaps: ?AWV -  completed on 04/20/20. ?Last BP - 119/84 on 01/09/2021 ?Last A1C - 6.5 on 07/16/2020 ?Malb - never done ?Covid booster - overdue ?  ?Star Rating Drugs: ?Atorvastatin '80mg'$  - last filled on 02/17/2021 30DS at [facility] ?Metformin '500mg'$   - last filled on 02/17/2021 30DS at [facility] ? ?[name] CMA  ?Clinical Pharmacist Assistant ?[phone] ? ?

## 2021-03-23 ENCOUNTER — Ambulatory Visit (INDEPENDENT_AMBULATORY_CARE_PROVIDER_SITE_OTHER): Payer: Medicare HMO | Admitting: Adult Health

## 2021-03-23 ENCOUNTER — Encounter: Payer: Self-pay | Admitting: Adult Health

## 2021-03-23 VITALS — BP 120/80 | HR 77 | Temp 98.9°F | Wt 191.0 lb

## 2021-03-23 DIAGNOSIS — Z1211 Encounter for screening for malignant neoplasm of colon: Secondary | ICD-10-CM

## 2021-03-23 DIAGNOSIS — H8113 Benign paroxysmal vertigo, bilateral: Secondary | ICD-10-CM

## 2021-03-23 MED ORDER — MECLIZINE HCL 25 MG PO TABS: 25.0000 mg | ORAL_TABLET | Freq: Three times a day (TID) | ORAL | 0 refills | Status: DC | PRN

## 2021-03-23 MED ORDER — FLUTICASONE PROPIONATE 50 MCG/ACT NA SUSP: 2.0000 | Freq: Every day | NASAL | 6 refills | Status: DC

## 2021-03-23 NOTE — Patient Instructions (Addendum)
I have sent in a medication called Meclizine to help with the dizziness ( this may make you feel sleepy) as well as flonase nasal spray  ? ?You also need to increase your water intake  ? ?Someone will call you to schedule your colonoscopy  ?
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
## 2021-04-06 ENCOUNTER — Telehealth: Payer: Self-pay | Admitting: Pharmacist

## 2021-04-06 NOTE — Chronic Care Management (AMB) (Signed)
Error

## 2021-04-09 NOTE — Progress Notes (Deleted)
? ?Chronic Care Management ?Pharmacy Note ? ?04/09/2021 ?Name:  Dylan Lane MRN:  [id number] DOB:  [birth year] ? ?Summary: ?*** ?  ?Recommendations/Changes made from today's visit: ?*** ?  ?Plan: ?*** ? ?Subjective: ?Dylan Lane is an 71 y.o. year old male who is a primary patient of Nafziger, Tommi Rumps, NP.  The CCM team was consulted for assistance with disease management and care coordination needs.   ? ?Engaged with patient face to face for follow up visit in response to provider referral for pharmacy case management and/or care coordination services.  ? ?Consent to Services:  ?The patient was given information about Chronic Care Management services, agreed to services, and gave verbal consent prior to initiation of services.  Please see initial visit note for detailed documentation.  ? ?Patient Care Team: ?[name], NP as PCP - General (Family Medicine) ?Belva Crome, MD as PCP - Cardiology (Cardiology) ?[facility], [facility] (Specialist) ?[name], [name], MD as Consulting Physician (Gastroenterology) ?Viona Gilmore, [facility] as Pharmacist (Pharmacist) ? ?Recent office visits: ?03/23/21 [name], NP: Patient presented for dizziness follow up. Prescribed meclizine PRN and fluticasone nasal spray. Referred to gastroenterology for colonoscopy. ? ?01/13/21 [name], NP: Patient presented for video visit for COVID infection. Continue Paxlovid. ? ?11/03/2020 [name] (PCP) - Patient was seen for other acute back pain and an additional issue. No Medication changes. No follow up noted. ? ?10/13/20 [name] (PCP) - seen for acute bilateral thoracic back pain. Patient started on methylprednisolone 53m as directed and increased cyclobenzaprine 182mto 3 times daily prn from daily at bedtime. Follow up as needed.  ? ?Recent consult visits: ?02/22/21 HeDaneen SchickMD (cardiology): Patient presented for angina follow up. No medication changes. ? ?01/04/21 [facility]): Patient  presented for eye exam. Unable to access notes. ? ?12/18/2020 [facility]) - Patient was seen for Angina pectoris and additional issues. No medication changes. Follow up as scheduled. ? ? ?Hospital visits: ?[facility]. Discharged after 35 minutes.  ?[facility] ?-started on Paxlovid 20 x 150 MG & 10 x 100MG TABS ?Medication Changes at Hospital Discharge: ?-No medication changes ?Medications Discontinued at Hospital Discharge: ?-No medications stopped ?Medications that remain the same after Hospital Discharge:??  ?-All other medications will remain the same.   ? ?[facility] for left heart cath and coronary angiography. ? ?Objective: ? ?Lab Results  ?Component Value Date  ? CREATININE 1.07 12/08/2020  ? BUN 12 12/08/2020  ? GFR 63.15 07/16/2020  ? GFRNONAA >60 11/20/2019  ? GFRAA 70 10/08/2019  ? NA 143 12/08/2020  ? K 3.8 12/08/2020  ? CALCIUM 10.2 12/08/2020  ? CO2 25 12/08/2020  ? GLUCOSE 92 12/08/2020  ? ? ?Lab Results  ?Component Value Date/Time  ? HGBA1C 6.5 07/16/2020 09:51 AM  ? HGBA1C 5.8 (H) 07/16/2019 08:18 AM  ? GFR 63.15 07/16/2020 09:51 AM  ? GFR 70.51 03/19/2019 11:04 AM  ?  ?Last diabetic Eye exam: No results found for: HMDIABEYEEXA  ?Last diabetic Foot exam: No results found for: HMDIABFOOTEX  ? ?Lab Results  ?Component Value Date  ? CHOL 119 07/16/2020  ? HDL 38.90 (L) 07/16/2020  ? LDMonaca9 07/16/2020  ? TRIG 56.0 07/16/2020  ? CHOLHDL 3 07/16/2020  ? ? ? ?  Latest Ref Rng & Units 07/16/2020  ?  9:51 AM 11/04/2019  ?  1:45 AM 10/08/2019  ? 10:34 AM  ?Hepatic Function  ?Total Protein 6.0 - 8.3 g/dL 6.9   6.9   7.2    ?Albumin 3.5 - 5.2 g/dL 4.6   4.1     ?AST 0 - 37 U/L _0 ?ALT 0 - 53 U/L _1 ?Alk Phosphatase 39 - 117 U/L 120   91     ?Total Bilirubin 0.2 - 1.2 mg/dL 0.9   0.7   0.5    ? ? ?Lab Results   ?Component Value Date/Time  ? TSH 1.55 07/16/2020 09:51 AM  ? TSH 1.85 07/16/2019 08:18 AM  ? ? ? ?  Latest Ref Rng & Units 12/08/2020  ? 11:50 AM 07/16/2020  ?  9:51 AM 11/20/2019  ?  7:50 AM  ?CBC  ?WBC 3.4 - 10.8 x10E3/uL 6.0   5.6   5.3    ?Hemoglobin 13.0 - 17.7 g/dL 14.6   15.0   14.0    ?Hematocrit 37.5 - 51.0 % 41.1   43.7   41.0    ?Platelets 150 - 450 x10E3/uL 178   174.0   214    ? ? ?Lab Results  ?Component Value Date/Time  ? VD25OH 15 (L) 06/05/2013 11:58 AM  ? ? ?Clinical ASCVD: Yes  ?The ASCVD Risk score (Arnett DK, et al., 2019) failed to calculate for the following reasons: ?  The patient has a prior MI or stroke diagnosis   ? ? ?  04/20/2020  ?  8:31 AM 04/20/2020  ?  8:30 AM 12/10/2019  ?  8:31 AM  ?Depression screen PHQ 2/9  ?Decreased Interest 0 0 0  ?Down, Depressed, Hopeless 0 0 1  ?PHQ - 2 Score 0 0 1  ?  ? ? ?Social History  ? ?Tobacco Use  ?Smoking Status Former  ? Types: Cigarettes  ? Quit date: 03/14/2000  ? Years since quitting: 21.0  ?Smokeless Tobacco Never  ? ?BP Readings from Last 3 Encounters:  ?03/23/21 120/80  ?02/22/21 112/68  ?01/09/21 119/84  ? ?Pulse Readings from Last 3 Encounters:  ?03/23/21 77  ?02/22/21 79  ?01/09/21 83  ? ?Wt Readings from Last 3 Encounters:  ?03/23/21 191 lb (86.6 kg)  ?02/22/21 192 lb 9.6 oz (87.4 kg)  ?01/13/21 205 lb (93 kg)  ? ?BMI Readings from Last 3 Encounters:  ?03/23/21 29.91 kg/m?  ?02/22/21 30.17 kg/m?  ?01/13/21 32.11 kg/m?  ? ? ?Assessment/Interventions: Review of patient past medical history, allergies, medications, health status, including review of consultants reports, laboratory and other test data, was performed as part of comprehensive evaluation and provision of chronic care management services.  ? ?SDOH:  (Social Determinants of Health) assessments and interventions performed: Yes ? ? ?SDOH Screenings  ? ?Alcohol Screen: Low Risk   ? Last Alcohol Screening Score (AUDIT): 0  ?Depression (PHQ2-9): Low Risk   ? PHQ-2 Score: 0  ?Financial  Resource Strain: Medium Risk  ? Difficulty of Paying Living Expenses: Somewhat hard  ?Food Insecurity: No Food Insecurity  ? Worried About Charity fundraiser in the Last Year: Never true  ? Ran Out of Food in the Last Year: Never true  ?Housing: Low Risk   ? Last Housing Risk Score: 0  ?Physical Activity: Insufficiently Active  ? Days of Exercise per Week: 3 days  ? Minutes of Exercise per Session: 30 min  ?Social Connections: Moderately Isolated  ?  Frequency of Communication with Friends and Family: More than three times a week  ? Frequency of Social Gatherings with Friends and Family: More than three times a week  ? Attends Religious Services: 1 to 4 times per year  ? Active Member of Clubs or Organizations: No  ? Attends Archivist Meetings: Never  ? Marital Status: Never married  ?Stress: No Stress Concern Present  ? Feeling of Stress : Not at all  ?Tobacco Use: Medium Risk  ? Smoking Tobacco Use: Former  ? Smokeless Tobacco Use: Never  ? Passive Exposure: Not on file  ?Transportation Needs: No Transportation Needs  ? Lack of Transportation (Medical): No  ? Lack of Transportation (Non-Medical): No  ? ? ?Patient was having issues with medications in the beginning and now alfluzosin is the only one that costs money. Patient also inquired about getting a refill for nitroglycerin and recommended for him to reach out to cardiologist for refill. ? ?Patient reports he is trying to keep up with his health. He was going to the gym and walking at 5am. His brother is a Biomedical scientist and was making some food for him. He typically eats macaroni salad, fried chicken every once in a while, not much red meat,  loves asparagus, spinach, collard greens,  lima beans and corn, and loves stir fry. He eats watermelon during the summer and tries to get whole wheat bread. He does admit to drinking regular sodas such as orange crush or pepsi. ? ? ?CCM Care Plan ? ?Allergies  ?Allergen Reactions  ? Amoxicillin Hives  ?   ?   ? ? ?Medications Reviewed Today   ? ? Reviewed by [name], NP (Nurse Practitioner) on 03/23/21 at 1526  Med List Status: <None>  ? ?Medication Order Taking? Sig Documenting Provider Last Dose Status Informant  ?alfuzosi

## 2021-04-12 ENCOUNTER — Ambulatory Visit: Payer: Medicare HMO

## 2021-04-13 ENCOUNTER — Telehealth: Payer: Self-pay | Admitting: Pharmacist

## 2021-04-13 NOTE — Chronic Care Management (AMB) (Signed)
? ? ?Chronic Care Management ?Pharmacy Assistant  ? ?Name: Dylan Lane  MRN: [id number] DOB: September 21, 1950 ? ? ?Reason for Encounter: Medication Review / Medication Coordination Call ?  ?Conditions to be addressed/monitored: ?HTN ? ? ?Recent office visits:  ?03/23/2021 Dylan Peng NP - Patient was seen for Benign paroxysmal positional vertigo due to bilateral vestibular disorder and an additional issue. Started Flonase 2 sprays daily and Meclizine 25 mg 3 times daily as needed. Discontinued Medrol dose pack. No follow up noted.  ? ?Recent consult visits:  ?None ? ?Hospital visits:  ?None ? ?Medications: ?Outpatient Encounter Medications as of 04/13/2021  ?Medication Sig  ? alfuzosin (UROXATRAL) 10 MG 24 hr tablet Take 10 mg by mouth daily.  ? amLODipine (NORVASC) 10 MG tablet Take 1 tablet (10 mg total) by mouth daily.  ? aspirin 81 MG EC tablet Take 1 tablet (81 mg total) by mouth daily.  ? atorvastatin (LIPITOR) 80 MG tablet Take 1 tablet (80 mg total) by mouth daily.  ? clopidogrel (PLAVIX) 75 MG tablet Take 1 tablet (75 mg total) by mouth daily.  ? cyclobenzaprine (FLEXERIL) 10 MG tablet Take 1 tablet (10 mg total) by mouth 3 (three) times daily as needed for muscle spasms.  ? fluticasone (FLONASE) 50 MCG/ACT nasal spray Place 2 sprays into both nostrils daily.  ? meclizine (ANTIVERT) 25 MG tablet Take 1 tablet (25 mg total) by mouth 3 (three) times daily as needed for dizziness.  ? metFORMIN (GLUCOPHAGE-XR) 500 MG 24 hr tablet Take 1 tablet (500 mg total) by mouth daily with breakfast.  ? metoprolol succinate (TOPROL-XL) 25 MG 24 hr tablet TAKE ONE TABLET BY MOUTH ONCE DAILY (Patient not taking: Reported on 03/23/2021)  ? nitroGLYCERIN (NITROSTAT) 0.4 MG SL tablet DISSOLVE 1 TABLET UNDER THE TONGUE EVERY 5 MINUTES AS NEEDED FOR CHEST PAIN. DO NOT EXCEED A TOTAL OF 3 DOSES IN 15 MINUTES.  ? pantoprazole (PROTONIX) 40 MG tablet Take 1 tablet (40 mg total) by mouth daily.  ? ?Facility-Administered Encounter  Medications as of 04/13/2021  ?Medication  ? sodium chloride flush (NS) 0.9 % injection 3 mL  ? ?Reviewed chart for medication changes ahead of medication coordination call. ? ?No OVs, Consults, or hospital visits since last care coordination call/Pharmacist visit. (If appropriate, list visit date, provider name) ? ?No medication changes indicated OR if recent visit, treatment plan here. ? ?BP Readings from Last 3 Encounters:  ?03/23/21 120/80  ?02/22/21 112/68  ?01/09/21 119/84  ?  ?Lab Results  ?Component Value Date  ? HGBA1C 6.5 07/16/2020  ?  ? ?Patient obtains medications through Vials  30 Days  ?  ?Last adherence delivery included:  ?Amlodipine '10mg'$  - 1 tablet with breakfast ?Pantoprazole '40mg'$  - 1 tablet with breakfast ?Atorvastatin '80mg'$  - 1 tablet with breakfast ?Metoprolol succinate '25mg'$  - 1 tablet with breakfast ?Alfuzosin ER '10mg'$  - 1 tablet with breakfast ?Metformin ER 500 mg - 1 tablet with breakfast ?Clopidogrel 75 mg - 1 tablet daily ?  ?Patient declined (meds) last month: Nitroglycerin 0.4 mg - take 1 tablet under the tongue every 5 minutes as needed for chest pain ?  ?Patient is due for next adherence delivery on: 04/23/2021 ?  ?  ?Called patient and reviewed medications and coordinated delivery. ?  ?This delivery to include: ?Amlodipine '10mg'$  - 1 tablet with breakfast ?Pantoprazole '40mg'$  - 1 tablet with breakfast ?Atorvastatin '80mg'$  - 1 tablet with breakfast ?Metoprolol succinate '25mg'$  - 1 tablet with breakfast ?Metformin ER 500 mg - 1  tablet with breakfast ?Clopidogrel 75 mg - 1 tablet daily ? ?Patient will need a short fill: No short fills needed ?  ?Coordinated acute fill: No acute fills needed ?  ?Patient declined the following medications: He has plenty on hand ? Nitroglycerin 0.4 mg - take 1 tablet under the tongue every 5 minutes as needed for chest pain ?Flonase NS 50 mcg/act - 2 sprays in each nostril daily ?Alfuzosin ER '10mg'$  - 1 tablet with breakfast ?  ?Confirmed delivery date of 04/23/2021,  advised patient that pharmacy will contact them the morning of delivery. ?  ?  ?Care Gaps: ?AWV -  message sent to Dylan Lane ?Last BP - 120/80 on 03/23/2021 ?Last A1C - 6.5 on 07/16/2020 ?Malb - never done ?Covid booster - overdue ?Colonoscopy - overdue ?  ?Star Rating Drugs: ?Atorvastatin '80mg'$  - last filled on 003/16/2023 30DS at [facility] ?Metformin '500mg'$   - last filled on 03/18/2021 30DS at [facility] ? ?[name] CMA  ?Clinical Pharmacist Assistant ?[phone] ? ?

## 2021-04-14 ENCOUNTER — Telehealth: Payer: Self-pay | Admitting: Adult Health

## 2021-04-14 NOTE — Telephone Encounter (Signed)
Left message for patient to call back and schedule Medicare Annual Wellness Visit (AWV) either virtually or in office. Left  my Herbie Drape number [phone] ? ? ?Last AWV 04/20/20 ? please schedule at anytime with [facility] Nurse Health Advisor 1 or 2 ? ? ? ?

## 2021-04-19 ENCOUNTER — Ambulatory Visit (INDEPENDENT_AMBULATORY_CARE_PROVIDER_SITE_OTHER): Payer: Medicare HMO | Admitting: Family Medicine

## 2021-04-19 ENCOUNTER — Encounter: Payer: Self-pay | Admitting: Family Medicine

## 2021-04-19 VITALS — BP 110/74 | HR 78 | Temp 99.0°F | Wt 190.0 lb

## 2021-04-19 DIAGNOSIS — M940 Chondrocostal junction syndrome [Tietze]: Secondary | ICD-10-CM | POA: Diagnosis not present

## 2021-04-19 NOTE — Progress Notes (Signed)
? ?  Subjective:  ? ? Patient ID: Dylan Lane, male    DOB: 31-Mar-1950, 71 y.o.   MRN: [id number] ? ?HPI ?Here for 3 weeks of intermittent sharp pains in the right side of his chest when he sleeps on his left side. During the day he has no pain at all. No recent trauma. No coughing or SOB. Tylenol helps a little.  ? ? ?Review of Systems  ?Constitutional: Negative.   ?Respiratory: Negative.    ?Cardiovascular:  Positive for chest pain. Negative for palpitations and leg swelling.  ?Gastrointestinal: Negative.   ? ?   ?Objective:  ? Physical Exam ?Constitutional:   ?   General: He is not in acute distress. ?   Appearance: Normal appearance.  ?Cardiovascular:  ?   Rate and Rhythm: Regular rhythm. Tachycardia present.  ?   Pulses: Normal pulses.  ?   Heart sounds: Normal heart sounds.  ?Pulmonary:  ?   Effort: Pulmonary effort is normal.  ?   Breath sounds: Normal breath sounds.  ?   Comments: He is mildly tender along the right sternal margin  ?Neurological:  ?   Mental Status: He is alert.  ? ? ? ? ? ?   ?Assessment & Plan:  ?Costochondritis, treat with Tylenol and Voltaren gel as needed.  ?Alysia Penna, MD ? ? ?

## 2021-04-27 ENCOUNTER — Ambulatory Visit: Payer: Medicare HMO | Admitting: Adult Health

## 2021-05-10 ENCOUNTER — Ambulatory Visit (INDEPENDENT_AMBULATORY_CARE_PROVIDER_SITE_OTHER): Payer: Medicare HMO

## 2021-05-10 VITALS — Ht 67.0 in | Wt 190.0 lb

## 2021-05-10 DIAGNOSIS — Z0000 Encounter for general adult medical examination without abnormal findings: Secondary | ICD-10-CM

## 2021-05-10 NOTE — Patient Instructions (Addendum)
?Mr. Lane , ?Thank you for taking time to come for your Medicare Wellness Visit. I appreciate your ongoing commitment to your health goals. Please review the following plan we discussed and let me know if I can assist you in the future.  ? ?These are the goals we discussed: ? Goals   ? ?   Blood Pressure < 140/90   ?   DIET - INCREASE LEAN PROTEINS   ?   Low fat heart health diet  ?  ?   Increase physical activity (pt-stated)   ?   LDL CALC < 130   ?   Manage My Medicine   ?   Timeframe:  Short-Term Goal ?Priority:  High ?Start Date:                             ?Expected End Date:                      ? ?Follow Up Date 8//30/2022  ?  ?- call for medicine refill 2 or 3 days before it runs out ?- use a pillbox to sort medicine  ?  ?Why is this important?   ?These steps will help you keep on track with your medicines. ?  ?Notes:  ?  ? ?  ?  ?This is a list of the screening recommended for you and due dates:  ?Health Maintenance  ?Topic Date Due  ? COVID-19 Vaccine (3 - Booster for Pfizer series) 05/26/2021*  ? Urine Protein Check  06/10/2021*  ? Colon Cancer Screening  05/11/2022*  ? Flu Shot  08/03/2021  ? Pneumonia Vaccine  Completed  ? Hepatitis C Screening: USPSTF Recommendation to screen - Ages 40-79 yo.  Completed  ? Zoster (Shingles) Vaccine  Completed  ? HPV Vaccine  Aged Out  ?*Topic was postponed. The date shown is not the original due date.  ?  ?Advanced directives: No Patient deferred ? ?Conditions/risks identified: None ? ?Next appointment: Follow up in one year for your annual wellness visit.  ? ? ?Preventive Care 69 Years and Older, Male ?Preventive care refers to lifestyle choices and visits with your health care provider that can promote health and wellness. ?What does preventive care include? ?A yearly physical exam. This is also called an annual well check. ?Dental exams once or twice a year. ?Routine eye exams. Ask your health care provider how often you should have your eyes checked. ?Personal  lifestyle choices, including: ?Daily care of your teeth and gums. ?Regular physical activity. ?Eating a healthy diet. ?Avoiding tobacco and drug use. ?Limiting alcohol use. ?Practicing safe sex. ?Taking low doses of aspirin every day. ?Taking vitamin and mineral supplements as recommended by your health care provider. ?What happens during an annual well check? ?The services and screenings done by your health care provider during your annual well check will depend on your age, overall health, lifestyle risk factors, and family history of disease. ?Counseling  ?Your health care provider may ask you questions about your: ?Alcohol use. ?Tobacco use. ?Drug use. ?Emotional well-being. ?Home and relationship well-being. ?Sexual activity. ?Eating habits. ?History of falls. ?Memory and ability to understand (cognition). ?Work and work Statistician. ?Screening  ?You may have the following tests or measurements: ?Height, weight, and BMI. ?Blood pressure. ?Lipid and cholesterol levels. These may be checked every 5 years, or more frequently if you are over 89 years old. ?Skin check. ?Lung cancer screening. You may have  this screening every year starting at age 69 if you have a 30-pack-year history of smoking and currently smoke or have quit within the past 15 years. ?Fecal occult blood test (FOBT) of the stool. You may have this test every year starting at age 30. ?Flexible sigmoidoscopy or colonoscopy. You may have a sigmoidoscopy every 5 years or a colonoscopy every 10 years starting at age 63. ?Prostate cancer screening. Recommendations will vary depending on your family history and other risks. ?Hepatitis C blood test. ?Hepatitis B blood test. ?Sexually transmitted disease (STD) testing. ?Diabetes screening. This is done by checking your blood sugar (glucose) after you have not eaten for a while (fasting). You may have this done every 1-3 years. ?Abdominal aortic aneurysm (AAA) screening. You may need this if you are a  current or former smoker. ?Osteoporosis. You may be screened starting at age 43 if you are at high risk. ?Talk with your health care provider about your test results, treatment options, and if necessary, the need for more tests. ?Vaccines  ?Your health care provider may recommend certain vaccines, such as: ?Influenza vaccine. This is recommended every year. ?Tetanus, diphtheria, and acellular pertussis (Tdap, Td) vaccine. You may need a Td booster every 10 years. ?Zoster vaccine. You may need this after age 64. ?Pneumococcal 13-valent conjugate (PCV13) vaccine. One dose is recommended after age 42. ?Pneumococcal polysaccharide (PPSV23) vaccine. One dose is recommended after age 34. ?Talk to your health care provider about which screenings and vaccines you need and how often you need them. ?This information is not intended to replace advice given to you by your health care provider. Make sure you discuss any questions you have with your health care provider. ?Document Released: 01/16/2015 Document Revised: 09/09/2015 Document Reviewed: 10/21/2014 ?Elsevier Interactive Patient Education ? 2017 [place]. ? ?Fall Prevention in the Home ?Falls can cause injuries. They can happen to people of all ages. There are many things you can do to make your home safe and to help prevent falls. ?What can I do on the outside of my home? ?Regularly fix the edges of walkways and driveways and fix any cracks. ?Remove anything that might make you trip as you walk through a door, such as a raised step or threshold. ?Trim any bushes or trees on the path to your home. ?Use bright outdoor lighting. ?Clear any walking paths of anything that might make someone trip, such as rocks or tools. ?Regularly check to see if handrails are loose or broken. Make sure that both sides of any steps have handrails. ?Any raised decks and porches should have guardrails on the edges. ?Have any leaves, snow, or ice cleared regularly. ?Use sand or salt on  walking paths during winter. ?Clean up any spills in your garage right away. This includes oil or grease spills. ?What can I do in the bathroom? ?Use night lights. ?Install grab bars by the toilet and in the tub and shower. Do not use towel bars as grab bars. ?Use non-skid mats or decals in the tub or shower. ?If you need to sit down in the shower, use a plastic, non-slip stool. ?Keep the floor dry. Clean up any water that spills on the floor as soon as it happens. ?Remove soap buildup in the tub or shower regularly. ?Attach bath mats securely with double-sided non-slip rug tape. ?Do not have throw rugs and other things on the floor that can make you trip. ?What can I do in the bedroom? ?Use night lights. ?Make sure that  you have a light by your bed that is easy to reach. ?Do not use any sheets or blankets that are too big for your bed. They should not hang down onto the floor. ?Have a firm chair that has side arms. You can use this for support while you get dressed. ?Do not have throw rugs and other things on the floor that can make you trip. ?What can I do in the kitchen? ?Clean up any spills right away. ?Avoid walking on wet floors. ?Keep items that you use a lot in easy-to-reach places. ?If you need to reach something above you, use a strong step stool that has a grab bar. ?Keep electrical cords out of the way. ?Do not use floor polish or wax that makes floors slippery. If you must use wax, use non-skid floor wax. ?Do not have throw rugs and other things on the floor that can make you trip. ?What can I do with my stairs? ?Do not leave any items on the stairs. ?Make sure that there are handrails on both sides of the stairs and use them. Fix handrails that are broken or loose. Make sure that handrails are as long as the stairways. ?Check any carpeting to make sure that it is firmly attached to the stairs. Fix any carpet that is loose or worn. ?Avoid having throw rugs at the top or bottom of the stairs. If you do  have throw rugs, attach them to the floor with carpet tape. ?Make sure that you have a light switch at the top of the stairs and the bottom of the stairs. If you do not have them, ask someone to add them for you
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
## 2021-05-11 ENCOUNTER — Encounter: Payer: Self-pay | Admitting: Adult Health

## 2021-05-11 ENCOUNTER — Ambulatory Visit (INDEPENDENT_AMBULATORY_CARE_PROVIDER_SITE_OTHER): Payer: Medicare HMO | Admitting: Adult Health

## 2021-05-11 DIAGNOSIS — B349 Viral infection, unspecified: Secondary | ICD-10-CM | POA: Diagnosis not present
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
## 2021-05-12 ENCOUNTER — Other Ambulatory Visit: Payer: Self-pay | Admitting: Internal Medicine

## 2021-05-12 ENCOUNTER — Other Ambulatory Visit: Payer: Self-pay | Admitting: Adult Health

## 2021-05-12 DIAGNOSIS — I10 Essential (primary) hypertension: Secondary | ICD-10-CM

## 2021-05-12 DIAGNOSIS — K219 Gastro-esophageal reflux disease without esophagitis: Secondary | ICD-10-CM

## 2021-05-12 DIAGNOSIS — R933 Abnormal findings on diagnostic imaging of other parts of digestive tract: Secondary | ICD-10-CM

## 2021-05-13 NOTE — Patient Instructions (Signed)
There are no preventive care reminders to display for this patient. ? ? ? Row Labels 05/10/2021  ?  8:24 AM 04/20/2020  ?  8:31 AM 04/20/2020  ?  8:30 AM  ?Depression screen PHQ 2/9   Section Header. No data exists in this row.     ?Decreased Interest   0 0 0  ?Down, Depressed, Hopeless   0 0 0  ?PHQ - 2 Score   0 0 0  ? ? ?

## 2021-05-14 ENCOUNTER — Telehealth: Payer: Self-pay | Admitting: Pharmacist

## 2021-05-14 NOTE — Chronic Care Management (AMB) (Signed)
? ? ?  Chronic Care Management ?Pharmacy Assistant  ? ?Name: Dylan Lane  MRN: [id number] DOB: 1950-08-25 ? ?05/17/2021 APPOINTMENT REMINDER ? ?Dylan Lane was reminded to have all medications, supplements and any blood glucose and blood pressure readings available for review with Jeni Salles, Pharm. D, at his office visit on 05/17/2021 at 4:00. ? ? ?Care Gaps: ?AWV -  scheduled 05/12/2022 ?Last BP - 110/74 on 04/19/2021 ?Last A1C - 6.5 on 07/16/2020 ?  ?Star Rating Drugs: ?Atorvastatin '80mg'$  - last filled on 04/16/2021 30DS at [facility] ?Metformin '500mg'$   - last filled on 04/16/2021 30DS at [facility] ? ? ?Any gaps in medications fill history? No ? ?[name] CMA  ?Clinical Pharmacist Assistant ?[phone] ? ?

## 2021-05-17 ENCOUNTER — Ambulatory Visit: Payer: Medicare HMO

## 2021-05-17 NOTE — Progress Notes (Deleted)
Chronic Care Management Pharmacy Note  05/17/2021 Name:  Volney Reierson MRN:  [id number] DOB:  30-Dec-1950  Summary: ***   Recommendations/Changes made from today's visit: ***   Plan: ***  Subjective: Dylan Lane is an 71 y.o. year old male who is a primary patient of [name], NP.  The CCM team was consulted for assistance with disease management and care coordination needs.    Engaged with patient face to face for follow up visit in response to provider referral for pharmacy case management and/or care coordination services.   Consent to Services:  The patient was given information about Chronic Care Management services, agreed to services, and gave verbal consent prior to initiation of services.  Please see initial visit note for detailed documentation.   Patient Care Team: [name], NP as PCP - General (Family Medicine) Belva Crome, MD as PCP - Cardiology (Cardiology) Ortho, [facility] (Specialist) [name], [name], MD as Consulting Physician (Gastroenterology) Viona Gilmore, [facility] as Pharmacist (Pharmacist)  Recent office visits: 05/11/21 [name], NP: Patient presented for video visit for viral illness.  05/10/21 Rolene Arbour, LPN: Patient presented for AWV.  04/19/21 Alysia Penna, MD: Patient presented for costochondritis. Recommended Tylenol and Voltaren gel PRN.  03/23/2021 [name] - Patient was seen for Benign paroxysmal positional vertigo due to bilateral vestibular disorder and an additional issue. Started Flonase 2 sprays daily and Meclizine 25 mg 3 times daily as needed. Discontinued Medrol dose pack. No follow up noted.    Recent consult visits: 02/22/21 Daneen Schick, MD (cardiology): Patient presented for angina follow up. Follow up in 9 months.  Hospital visits: None in previous 6 monthsPatient was seen at [facility] ED on 01/09/2021 due to Covid. Discharged after 35 minutes.  New?Medications Started at [facility]  Discharge:?? -started on Paxlovid 20 x 150 MG & 10 x 100MG TABS Medication Changes at Hospital Discharge: -No medication changes Medications Discontinued at Hospital Discharge: -No medications stopped Medications that remain the same after Hospital Discharge:??  -All other medications will remain the same.    Objective:  Lab Results  Component Value Date   CREATININE 1.07 12/08/2020   BUN 12 12/08/2020   GFR 63.15 07/16/2020   GFRNONAA >60 11/20/2019   GFRAA 70 10/08/2019   NA 143 12/08/2020   K 3.8 12/08/2020   CALCIUM 10.2 12/08/2020   CO2 25 12/08/2020   GLUCOSE 92 12/08/2020    Lab Results  Component Value Date/Time   HGBA1C 6.5 07/16/2020 09:51 AM   HGBA1C 5.8 (H) 07/16/2019 08:18 AM   GFR 63.15 07/16/2020 09:51 AM   GFR 70.51 03/19/2019 11:04 AM    Last diabetic Eye exam: No results found for: HMDIABEYEEXA  Last diabetic Foot exam: No results found for: HMDIABFOOTEX   Lab Results  Component Value Date   CHOL 119 07/16/2020   HDL 38.90 (L) 07/16/2020   LDLCALC 69 07/16/2020   TRIG 56.0 07/16/2020   CHOLHDL 3 07/16/2020       Latest Ref Rng & Units 07/16/2020    9:51 AM 11/04/2019    1:45 AM 10/08/2019   10:34 AM  Hepatic Function  Total Protein 6.0 - 8.3 g/dL 6.9   6.9   7.2    Albumin 3.5 - 5.2 g/dL 4.6   4.1     AST 0 - 37 U/L _0 ALT 0 - 53 U/L 26   22   14  Alk Phosphatase 39 - 117 U/L 120   91     Total Bilirubin 0.2 - 1.2 mg/dL 0.9   0.7   0.5      Lab Results  Component Value Date/Time   TSH 1.55 07/16/2020 09:51 AM   TSH 1.85 07/16/2019 08:18 AM       Latest Ref Rng & Units 12/08/2020   11:50 AM 07/16/2020    9:51 AM 11/20/2019    7:50 AM  CBC  WBC 3.4 - 10.8 x10E3/uL 6.0   5.6   5.3    Hemoglobin 13.0 - 17.7 g/dL 14.6   15.0   14.0    Hematocrit 37.5 - 51.0 % 41.1   43.7   41.0    Platelets 150 - 450 x10E3/uL 178   174.0   214      Lab Results  Component Value Date/Time   VD25OH 15 (L) 06/05/2013 11:58 AM     Clinical ASCVD: Yes  The ASCVD Risk score (Arnett DK, et al., 2019) failed to calculate for the following reasons:   The patient has a prior MI or stroke diagnosis       05/10/2021    8:24 AM 04/20/2020    8:31 AM 04/20/2020    8:30 AM  Depression screen PHQ 2/9  Decreased Interest 0 0 0  Down, Depressed, Hopeless 0 0 0  PHQ - 2 Score 0 0 0      Social History   Tobacco Use  Smoking Status Former   Types: Cigarettes   Quit date: 03/14/2000   Years since quitting: 21.1  Smokeless Tobacco Never   BP Readings from Last 3 Encounters:  04/19/21 110/74  03/23/21 120/80  02/22/21 112/68   Pulse Readings from Last 3 Encounters:  04/19/21 78  03/23/21 77  02/22/21 79   Wt Readings from Last 3 Encounters:  05/10/21 190 lb (86.2 kg)  04/19/21 190 lb (86.2 kg)  03/23/21 191 lb (86.6 kg)   BMI Readings from Last 3 Encounters:  05/10/21 29.76 kg/m  04/19/21 29.76 kg/m  03/23/21 29.91 kg/m    Assessment/Interventions: Review of patient past medical history, allergies, medications, health status, including review of consultants reports, laboratory and other test data, was performed as part of comprehensive evaluation and provision of chronic care management services.   SDOH:  (Social Determinants of Health) assessments and interventions performed: Yes   SDOH Screenings   Alcohol Screen: Low Risk    Last Alcohol Screening Score (AUDIT): 0  Depression (PHQ2-9): Low Risk    PHQ-2 Score: 0  Financial Resource Strain: Low Risk    Difficulty of Paying Living Expenses: Not hard at all  Food Insecurity: No Food Insecurity   Worried About Charity fundraiser in the Last Year: Never true   Ran Out of Food in the Last Year: Never true  Housing: Low Risk    Last Housing Risk Score: 0  Physical Activity: Insufficiently Active   Days of Exercise per Week: 1 day   Minutes of Exercise per Session: 30 min  Social Connections: Moderately Integrated   Frequency of Communication  with Friends and Family: More than three times a week   Frequency of Social Gatherings with Friends and Family: More than three times a week   Attends Religious Services: More than 4 times per year   Active Member of [employer] or Organizations: Yes   Attends Archivist Meetings: More than 4 times per year   Marital Status: Never married  Stress: No Stress Concern Present   Feeling of Stress : Not at all  Tobacco Use: Medium Risk   Smoking Tobacco Use: Former   Smokeless Tobacco Use: Never   Passive Exposure: Not on file  Transportation Needs: No Transportation Needs   Lack of Transportation (Medical): No   Lack of Transportation (Non-Medical): No    Patient was having issues with medications in the beginning and now alfluzosin is the only one that costs money. Patient also inquired about getting a refill for nitroglycerin and recommended for him to reach out to cardiologist for refill.  Patient reports he is trying to keep up with his health. He was going to the gym and walking at 5am. His brother is a Biomedical scientist and was making some food for him. He typically eats macaroni salad, fried chicken every once in a while, not much red meat,  loves asparagus, spinach, collard greens,  lima beans and corn, and loves stir fry. He eats watermelon during the summer and tries to get whole wheat bread. He does admit to drinking regular sodas such as orange crush or pepsi.   CCM Care Plan  Allergies  Allergen Reactions   Amoxicillin Hives         Medications Reviewed Today     Reviewed by [name], NP (Nurse Practitioner) on 05/11/21 at 1100  Med List Status: <None>   Medication Order Taking? Sig Documenting Provider Last Dose Status Informant  alfuzosin (UROXATRAL) 10 MG 24 hr tablet [id number] No Take 10 mg by mouth daily. [provider] Taking Active   amLODipine (NORVASC) 10 MG tablet [id number] No Take 1 tablet (10 mg total) by mouth daily. Nafziger, Tommi Rumps, NP Taking Active    aspirin 81 MG EC tablet [id number] No Take 1 tablet (81 mg total) by mouth daily. Jessee Avers, MD Taking Active Self  atorvastatin (LIPITOR) 80 MG tablet [id number] No Take 1 tablet (80 mg total) by mouth daily. Belva Crome, MD Taking Active   clopidogrel (PLAVIX) 75 MG tablet [id number] No Take 1 tablet (75 mg total) by mouth daily. Belva Crome, MD Taking Active   cyclobenzaprine (FLEXERIL) 10 MG tablet [id number] No Take 1 tablet (10 mg total) by mouth 3 (three) times daily as needed for muscle spasms. Nafziger, Tommi Rumps, NP Taking Active   fluticasone (FLONASE) 50 MCG/ACT nasal spray [id number] No Place 2 sprays into both nostrils daily. Nafziger, Tommi Rumps, NP Taking Active   meclizine (ANTIVERT) 25 MG tablet [id number] No Take 1 tablet (25 mg total) by mouth 3 (three) times daily as needed for dizziness. Nafziger, Tommi Rumps, NP Taking Active   metFORMIN (GLUCOPHAGE-XR) 500 MG 24 hr tablet [id number] No Take 1 tablet (500 mg total) by mouth daily with breakfast. [name], NP Taking Active   metoprolol succinate (TOPROL-XL) 25 MG 24 hr tablet [id number] No TAKE ONE TABLET BY MOUTH ONCE DAILY Belva Crome, MD Taking Active   nitroGLYCERIN (NITROSTAT) 0.4 MG SL tablet [id number] No DISSOLVE 1 TABLET UNDER THE TONGUE EVERY 5 MINUTES AS NEEDED FOR CHEST PAIN. DO NOT EXCEED A TOTAL OF 3 DOSES IN 15 MINUTES. Belva Crome, MD Taking Active   pantoprazole (PROTONIX) 40 MG tablet [id number] No Take 1 tablet (40 mg total) by mouth daily. Jerene Bears, MD Taking Active   sodium chloride flush (NS) 0.9 % injection 3 mL [id number]   Josue Hector, MD  Active             Patient Active Problem List  Diagnosis Date Noted   Angina pectoris ([place])    CAD S/P percutaneous coronary angioplasty 11/06/2019   Dyspnea 11/06/2019   NSTEMI (non-ST elevated myocardial infarction) ([place]) 10/21/2019   Acquired trigger finger of left middle finger 08/13/2019   Pain in finger of left hand 08/13/2019   Erectile  dysfunction 08/09/2016   Low testosterone in male 08/09/2016   BPH (benign prostatic hyperplasia) 01/23/2015   History of glaucoma 10/10/2014   Vitamin D deficiency 04/18/2014   GERD (gastroesophageal reflux disease) 06/05/2013   Prediabetes 06/05/2013   High risk homosexual behavior 06/05/2013   Essential hypertension 12/07/2009   COLONIC POLYPS, ADENOMATOUS 01/05/2006   Hyperlipidemia 01/05/2006    Immunization History  Administered Date(s) Administered   Influenza Split 11/21/2011   Influenza Whole 11/23/2009   Influenza,inj,Quad PF,6+ Mos 12/06/2018, 11/03/2020   Influenza,inj,Quad PF,6-35 Mos 01/29/2016   Moderna SARS-COV2 Booster Vaccination 12/20/2019   PFIZER(Purple Top)SARS-COV-2 Vaccination 02/07/2019, 02/28/2019   Pneumococcal Conjugate-13 07/16/2019   Pneumococcal Polysaccharide-23 07/16/2020   Zoster Recombinat (Shingrix) 07/16/2019, 10/08/2019    Conditions to be addressed/monitored:  Hypertension, Hyperlipidemia, Coronary Artery Disease, GERD, BPH and Prediabetes  Conditions addressed this visit: ***  There are no care plans that you recently modified to display for this patient.    Medication Assistance: None required.  Patient affirms current coverage meets needs.  Compliance/Adherence/Medication fill history: Care Gaps: Last BP - 110/74 on 04/19/2021 Last A1C - 6.5 on 07/16/2020   Star-Rating Drugs: Atorvastatin 37m - last filled on 04/16/2021 30DS at [facility] Metformin 5054m - last filled on 04/16/2021 30DS at [facility]  Patient's preferred [place]  [facility] [place] [address] [id number]: [phone]: [phone] pill box? Yes - not using consistently (was lazy) Pt endorses 99% compliance  We discussed: Benefits of medication synchronization, packaging and delivery as well as enhanced pharmacist oversight with [facility]. Patient  decided to: Utilize [facility] pharmacy for medication synchronization, packaging and delivery  Care Plan and Follow Up Patient Decision:  Patient agrees to Care Plan and Follow-up.  Plan: The care management team will reach out to the patient again over the next 30 days.  MaJeni SallesPharmD [facility] Center[facility]ical Pharmacist [place]

## 2021-05-18 ENCOUNTER — Telehealth: Payer: Self-pay | Admitting: Pharmacist

## 2021-05-18 NOTE — Chronic Care Management (AMB) (Signed)
? ? ?Chronic Care Management ?Pharmacy Assistant  ? ?Name: Dylan Lane  MRN: [id number] DOB: [birth year] ? ?Reason for Encounter: Medication Review / Medication Coordination Call ?  ?Conditions to be addressed/monitored: ?HTN ? ?Recent office visits:  ?05/11/2021 [name] - Patient was seen for viral illness. No medication changes. Advised he can take Coricidin HBP. Follow up if symptoms fail to resolve or get worse over the next 2-3 days. ? ?05/10/2021 Rolene Arbour LPN - Medicare annual wellness exam. ? ?04/19/2021 Alysia Penna MD - Patient was seen for Costochondritis, acute. No medication changes. No follow up noted. ? ?Recent consult visits:  ?None ? ?Hospital visits:  ?None ? ?Medications: ?Outpatient Encounter Medications as of 05/18/2021  ?Medication Sig  ? alfuzosin (UROXATRAL) 10 MG 24 hr tablet Take 10 mg by mouth daily.  ? amLODipine (NORVASC) 10 MG tablet TAKE ONE TABLET BY MOUTH ONCE DAILY  ? aspirin 81 MG EC tablet Take 1 tablet (81 mg total) by mouth daily.  ? atorvastatin (LIPITOR) 80 MG tablet Take 1 tablet (80 mg total) by mouth daily.  ? clopidogrel (PLAVIX) 75 MG tablet Take 1 tablet (75 mg total) by mouth daily.  ? cyclobenzaprine (FLEXERIL) 10 MG tablet Take 1 tablet (10 mg total) by mouth 3 (three) times daily as needed for muscle spasms.  ? fluticasone (FLONASE) 50 MCG/ACT nasal spray Place 2 sprays into both nostrils daily.  ? meclizine (ANTIVERT) 25 MG tablet Take 1 tablet (25 mg total) by mouth 3 (three) times daily as needed for dizziness.  ? metFORMIN (GLUCOPHAGE-XR) 500 MG 24 hr tablet Take 1 tablet (500 mg total) by mouth daily with breakfast.  ? metoprolol succinate (TOPROL-XL) 25 MG 24 hr tablet TAKE ONE TABLET BY MOUTH ONCE DAILY  ? nitroGLYCERIN (NITROSTAT) 0.4 MG SL tablet DISSOLVE 1 TABLET UNDER THE TONGUE EVERY 5 MINUTES AS NEEDED FOR CHEST PAIN. DO NOT EXCEED A TOTAL OF 3 DOSES IN 15 MINUTES.  ? pantoprazole (PROTONIX) 40 MG tablet Take 1 tablet (40 mg total) by  mouth every morning. NEEDS OFFICE VISIT FOR FURTHER REFILLS  ? ?Facility-Administered Encounter Medications as of 05/18/2021  ?Medication  ? sodium chloride flush (NS) 0.9 % injection 3 mL  ?Reviewed chart for medication changes ahead of medication coordination call. ? ?No OVs, Consults, or hospital visits since last care coordination call/Pharmacist visit. (If appropriate, list visit date, provider name) ? ?No medication changes indicated OR if recent visit, treatment plan here. ? ?BP Readings from Last 3 Encounters:  ?04/19/21 110/74  ?03/23/21 120/80  ?02/22/21 112/68  ?  ?Lab Results  ?Component Value Date  ? HGBA1C 6.5 07/16/2020  ?  ? ?Patient obtains medications through Vials  30 Days  ?  ?Last adherence delivery included:  ?Amlodipine '10mg'$  - 1 tablet with breakfast ?Pantoprazole '40mg'$  - 1 tablet with breakfast ?Atorvastatin '80mg'$  - 1 tablet with breakfast ?Metoprolol succinate '25mg'$  - 1 tablet with breakfast ?Metformin ER 500 mg - 1 tablet with breakfast ?Clopidogrel 75 mg - 1 tablet daily ?  ?Patient declined (meds) last month:  ?Nitroglycerin 0.4 mg - take 1 tablet under the tongue every 5 minutes as needed for chest pain ?Flonase NS 50 mcg/act - 2 sprays in each nostril daily ?Alfuzosin ER '10mg'$  - 1 tablet with breakfast ?  ?Patient is due for next adherence delivery on: 05/25/2021 ?  ?  ?Called patient and reviewed medications and coordinated delivery. ?  ?This delivery to include: ?Amlodipine '10mg'$  - 1 tablet with breakfast ?Pantoprazole  $'40mg'K$  - 1 tablet with breakfast ?Atorvastatin '80mg'$  - 1 tablet with breakfast ?Metoprolol succinate '25mg'$  - 1 tablet with breakfast ?Metformin ER 500 mg - 1 tablet with breakfast ?Clopidogrel 75 mg - 1 tablet daily ?Alfuzosin ER '10mg'$  - 1 tablet with breakfast ? ?Patient will need a short fill: No short fills needed ?  ?Coordinated acute fill: No acute fills needed ?  ?Patient declined the following medications: He has plenty on hand ?Nitroglycerin 0.4 mg - take 1 tablet under  the tongue every 5 minutes as needed for chest pain ?Flonase NS 50 mcg/act - 2 sprays in each nostril daily ? ?Confirmed delivery date of 05/25/2021 advised patient that pharmacy will contact them the morning of delivery. ?  ? ?Care Gaps: ?AWV - competed 05/10/2021 ?Last BP - 110/74 on 04/19/2021 ?Last A1C - 6.5 on 07/16/2020 ?No others listed ? ?Star Rating Drugs: ?Atorvastatin '80mg'$  - last filled on 04/16/2021 30DS at [facility] ?Metformin '500mg'$   - last filled on 04/16/2021 30DS at [facility] ? ?[name] CMA  ?Clinical Pharmacist Assistant ?[phone] ? ?

## 2021-06-10 ENCOUNTER — Other Ambulatory Visit: Payer: Self-pay | Admitting: Interventional Cardiology

## 2021-06-10 ENCOUNTER — Other Ambulatory Visit: Payer: Self-pay | Admitting: Adult Health

## 2021-06-11 ENCOUNTER — Telehealth: Payer: Self-pay | Admitting: Pharmacist

## 2021-06-11 NOTE — Chronic Care Management (AMB) (Unsigned)
Chronic Care Management Pharmacy Assistant   Name: Dylan Lane  MRN: [id number] DOB: 03-Apr-1950  Reason for Encounter: Medication Review / Medication Coordination Call   Conditions to be addressed/monitored: HTN  Recent office visits:  None  Recent consult visits:  None  Hospital visits:  None  Medications: Outpatient Encounter Medications as of 06/11/2021  Medication Sig   alfuzosin (UROXATRAL) 10 MG 24 hr tablet Take 10 mg by mouth daily.   amLODipine (NORVASC) 10 MG tablet TAKE ONE TABLET BY MOUTH ONCE DAILY   aspirin 81 MG EC tablet Take 1 tablet (81 mg total) by mouth daily.   atorvastatin (LIPITOR) 80 MG tablet Take 1 tablet (80 mg total) by mouth daily.   clopidogrel (PLAVIX) 75 MG tablet TAKE ONE TABLET BY MOUTH ONCE DAILY   cyclobenzaprine (FLEXERIL) 10 MG tablet Take 1 tablet (10 mg total) by mouth 3 (three) times daily as needed for muscle spasms.   fluticasone (FLONASE) 50 MCG/ACT nasal spray Place 2 sprays into both nostrils daily.   meclizine (ANTIVERT) 25 MG tablet Take 1 tablet (25 mg total) by mouth 3 (three) times daily as needed for dizziness.   metFORMIN (GLUCOPHAGE-XR) 500 MG 24 hr tablet TAKE ONE TABLET BY MOUTH EVERY MORNING with breakfast   metoprolol succinate (TOPROL-XL) 25 MG 24 hr tablet TAKE ONE TABLET BY MOUTH ONCE DAILY   nitroGLYCERIN (NITROSTAT) 0.4 MG SL tablet DISSOLVE 1 TABLET UNDER THE TONGUE EVERY 5 MINUTES AS NEEDED FOR CHEST PAIN. DO NOT EXCEED A TOTAL OF 3 DOSES IN 15 MINUTES.   pantoprazole (PROTONIX) 40 MG tablet Take 1 tablet (40 mg total) by mouth every morning. NEEDS OFFICE VISIT FOR FURTHER REFILLS   Facility-Administered Encounter Medications as of 06/11/2021  Medication   sodium chloride flush (NS) 0.9 % injection 3 mL   Reviewed chart for medication changes ahead of medication coordination call.  No OVs, Consults, or hospital visits since last care coordination call/Pharmacist visit. (If appropriate, list visit date,  provider name)  No medication changes indicated OR if recent visit, treatment plan here.  BP Readings from Last 3 Encounters:  04/19/21 110/74  03/23/21 120/80  02/22/21 112/68    Lab Results  Component Value Date   HGBA1C 6.5 07/16/2020     Patient obtains medications through Vials  30 Days    Last adherence delivery included:  Amlodipine '10mg'$  - 1 tablet with breakfast Pantoprazole '40mg'$  - 1 tablet with breakfast Atorvastatin '80mg'$  - 1 tablet with breakfast Metoprolol succinate '25mg'$  - 1 tablet with breakfast Metformin ER 500 mg - 1 tablet with breakfast Clopidogrel 75 mg - 1 tablet daily Alfuzosin ER '10mg'$  - 1 tablet with breakfast   Patient declined (meds) last month:  Nitroglycerin 0.4 mg - take 1 tablet under the tongue every 5 minutes as needed for chest pain Flonase NS 50 mcg/act - 2 sprays in each nostril daily   Patient is due for next adherence delivery on: 06/23/2021     Called patient and reviewed medications and coordinated delivery.   This delivery to include: Amlodipine '10mg'$  - 1 tablet with breakfast Pantoprazole '40mg'$  - 1 tablet with breakfast Atorvastatin '80mg'$  - 1 tablet with breakfast Metoprolol succinate '25mg'$  - 1 tablet with breakfast Metformin ER 500 mg - 1 tablet with breakfast Clopidogrel 75 mg - 1 tablet daily Alfuzosin ER '10mg'$  - 1 tablet with breakfast Nitroglycerin 0.4 mg - take 1 tablet under the tongue every 5 minutes as needed for chest pain   Patient  will need a short fill: No short fills needed   Coordinated acute fill: No acute fills needed   Patient declined the following medications: He has plenty on hand Flonase NS 50 mcg/act - 2 sprays in each nostril daily    Confirmed delivery date of 06/23/2021 advised patient that pharmacy will contact them the morning of delivery.   Care Gaps: AWV - scheduled 05/12/2022 Last BP - 110/74 on 04/19/2021 Last A1C - 6.5 on 07/16/2020 [place] - never done Covid booster - overdue Colonoscopy -  postponed  Star Rating Drugs: Atorvastatin '80mg'$  - 05/19/2021 30 DS at [facility] Metformin '500mg'$  -  05/19/2021 30 DS at [place] Pharmacist Assistant [phone]

## 2021-06-18 NOTE — Telephone Encounter (Signed)
error 

## 2021-07-09 ENCOUNTER — Telehealth: Payer: Self-pay

## 2021-07-09 NOTE — Telephone Encounter (Signed)
Caller requests to speak with Provider Tommi Rumps. Pt c/ o male problem and declined to disclose further detail. ---Caller states he would like to speak with Tommi Rumps his provider. has swelling around the base of his penis. has itch. Has used cortisone cream. no rash. no pain. no fever.  07/09/2021 7:17:50 AM SEE PCP WITHIN 3 DAYS Raphael Gibney, RN, Vanita Ingles  Comments User: Dannielle Burn, RN Date/Time Eilene Ghazi Time): 07/09/2021 7:17:32 AM please call pt back regarding appt. pt would like to speak with his provider Tommi Rumps    ---Caller states he has pubic itching and swelling next to his penis. no rash or sores. no fever.   07/09/2021 10:51:12 AM See HCP within 4 Hours (or PCP triage) Raphael Gibney, RN, [place]  Comments User: Dannielle Burn, RN Date/Time Eilene Ghazi Time): 07/09/2021 10:50:47 AM Called office and nothing is available within 4 hrs. Advised pt to go to urgent care. Verbalized understanding Referrals GO TO FACILITY UNDECIDED

## 2021-07-12 ENCOUNTER — Telehealth: Payer: Self-pay | Admitting: Pharmacist

## 2021-07-12 NOTE — Telephone Encounter (Signed)
Error

## 2021-07-12 NOTE — Chronic Care Management (AMB) (Signed)
Chronic Care Management Pharmacy Assistant   Name: Dylan Lane  MRN: [id number] DOB: 17-May-1950  Reason for Encounter: Medication Review / Medication Coordination Call  Recent office visits:  None  Recent consult visits:  None  Hospital visits:  None  Medications: Outpatient Encounter Medications as of 07/12/2021  Medication Sig   alfuzosin (UROXATRAL) 10 MG 24 hr tablet Take 10 mg by mouth daily.   amLODipine (NORVASC) 10 MG tablet TAKE ONE TABLET BY MOUTH ONCE DAILY   aspirin 81 MG EC tablet Take 1 tablet (81 mg total) by mouth daily.   atorvastatin (LIPITOR) 80 MG tablet Take 1 tablet (80 mg total) by mouth daily.   clopidogrel (PLAVIX) 75 MG tablet TAKE ONE TABLET BY MOUTH ONCE DAILY   cyclobenzaprine (FLEXERIL) 10 MG tablet Take 1 tablet (10 mg total) by mouth 3 (three) times daily as needed for muscle spasms.   fluticasone (FLONASE) 50 MCG/ACT nasal spray Place 2 sprays into both nostrils daily.   meclizine (ANTIVERT) 25 MG tablet Take 1 tablet (25 mg total) by mouth 3 (three) times daily as needed for dizziness.   metFORMIN (GLUCOPHAGE-XR) 500 MG 24 hr tablet TAKE ONE TABLET BY MOUTH EVERY MORNING with breakfast   metoprolol succinate (TOPROL-XL) 25 MG 24 hr tablet TAKE ONE TABLET BY MOUTH ONCE DAILY   nitroGLYCERIN (NITROSTAT) 0.4 MG SL tablet DISSOLVE 1 TABLET UNDER THE TONGUE EVERY 5 MINUTES AS NEEDED FOR CHEST PAIN. DO NOT EXCEED A TOTAL OF 3 DOSES IN 15 MINUTES.   pantoprazole (PROTONIX) 40 MG tablet Take 1 tablet (40 mg total) by mouth every morning. NEEDS OFFICE VISIT FOR FURTHER REFILLS   Facility-Administered Encounter Medications as of 07/12/2021  Medication   sodium chloride flush (NS) 0.9 % injection 3 mL  Reviewed chart for medication changes ahead of medication coordination call.  No OVs, Consults, or hospital visits since last care coordination call/Pharmacist visit. (If appropriate, list visit date, provider name)  No medication changes  indicated OR if recent visit, treatment plan here.  BP Readings from Last 3 Encounters:  04/19/21 110/74  03/23/21 120/80  02/22/21 112/68    Lab Results  Component Value Date   HGBA1C 6.5 07/16/2020   Patient obtains medications through V[facility]  30 Days    Last adherence delivery included:  Amlodipine '10mg'$  - 1 tablet with breakfast Pantoprazole '40mg'$  - 1 tablet with breakfast Atorvastatin '80mg'$  - 1 tablet with breakfast Metoprolol succinate '25mg'$  - 1 tablet with breakfast Metformin ER 500 mg - 1 tablet with breakfast Clopidogrel 75 mg - 1 tablet daily Alfuzosin ER '10mg'$  - 1 tablet with breakfast Nitroglycerin 0.4 mg - take 1 tablet under the tongue every 5 minutes as needed for chest pain  Patient declined (meds) last month:  Flonase NS 50 mcg/act - 2 sprays in each nostril daily   Patient is due for next adherence delivery on: 07/22/2021     Called patient and reviewed medications and coordinated delivery.   This delivery to include: Alfuzosin ER '10mg'$  - 1 tablet once daily Metoprolol succinate '25mg'$  - 1 tablet once daily Pantoprazole '40mg'$  - 1 tablet with breakfast Clopidogrel 75 mg - 1 tablet once daily  Amlodipine '10mg'$  - 1 tablet once daily Atorvastatin '80mg'$  - 1 tablet once daily Metformin ER 500 mg - 1 tablet with breakfast   Patient will need a short fill: No short fills needed   Coordinated acute fill: No acute fills needed   Patient declined the following medications: He has  plenty on hand Flonase NS 50 mcg/act - 2 sprays in each nostril daily Nitroglycerin 0.4 mg - take 1 tablet under the tongue every 5 minutes as needed for chest pain  Confirmed delivery date of 07/22/2021 advised patient that pharmacy will contact them the morning of delivery.   Care Gaps: AWV - scheduled 05/12/2022 Last BP - 110/74 on 04/19/2021 Last A1C - 6.5 on 07/16/2020 [place] - never done Covid booster - overdue Colonoscopy - postponed  Star Rating Drugs: Atorvastatin '80mg'$  - last  filled 06/17/2021 30 DS at [facility] Metformin '500mg'$  - last filled 06/17/2021 30 DS at [place] [phone]

## 2021-07-12 NOTE — Telephone Encounter (Signed)
Pt states he took allergy pills from his brother & it's resolved. Denies fever. Urinating normal. No discharge noted. Pt states this has never happened before.  Since resolved, pt advised to contact [facility] for an appt if reoccurs. Pt verb understanding.

## 2021-07-15 ENCOUNTER — Telehealth: Payer: Self-pay | Admitting: Interventional Cardiology

## 2021-07-15 NOTE — Telephone Encounter (Signed)
Left message for patient to call back  

## 2021-07-15 NOTE — Telephone Encounter (Signed)
Patient states he got a job offer at [employer] and would like to know if he would be able to do it.

## 2021-07-15 NOTE — Telephone Encounter (Signed)
Spoke with the patient who was offered a job at [employer]. He will be doing lifting anywhere from 20-70lbs. He would like to know if Dr. Tamala Julian thinks that this would be okay for him to do. He states that he has been feeling good. Denies any recent angina or shortness of breath. Will route to Dr. Tamala Julian for advisement.

## 2021-07-19 NOTE — Telephone Encounter (Signed)
Spoke with patient and shared Dr. Thompson Caul recommendation: Dylan Lane to do job. Report any chest pain associated with heavy lifting. If occurs, stop and take NTG to relieve if lasts longer than 5 minutes without resolving.   Patient verbalized understanding and expressed appreciation for call.

## 2021-07-22 ENCOUNTER — Telehealth: Payer: Self-pay | Admitting: Pharmacist

## 2021-07-22 ENCOUNTER — Ambulatory Visit (INDEPENDENT_AMBULATORY_CARE_PROVIDER_SITE_OTHER): Payer: Medicare HMO | Admitting: Adult Health

## 2021-07-22 ENCOUNTER — Encounter: Payer: Self-pay | Admitting: Adult Health

## 2021-07-22 VITALS — BP 110/82 | HR 60 | Temp 98.5°F | Ht 67.0 in | Wt 186.8 lb

## 2021-07-22 DIAGNOSIS — E785 Hyperlipidemia, unspecified: Secondary | ICD-10-CM

## 2021-07-22 DIAGNOSIS — R7303 Prediabetes: Secondary | ICD-10-CM | POA: Diagnosis not present

## 2021-07-22 DIAGNOSIS — I10 Essential (primary) hypertension: Secondary | ICD-10-CM

## 2021-07-22 DIAGNOSIS — Z0000 Encounter for general adult medical examination without abnormal findings: Secondary | ICD-10-CM

## 2021-07-22 DIAGNOSIS — N400 Enlarged prostate without lower urinary tract symptoms: Secondary | ICD-10-CM | POA: Diagnosis not present

## 2021-07-22 DIAGNOSIS — K219 Gastro-esophageal reflux disease without esophagitis: Secondary | ICD-10-CM | POA: Diagnosis not present

## 2021-07-22 DIAGNOSIS — Z9861 Coronary angioplasty status: Secondary | ICD-10-CM

## 2021-07-22 DIAGNOSIS — I2510 Atherosclerotic heart disease of native coronary artery without angina pectoris: Secondary | ICD-10-CM | POA: Diagnosis not present

## 2021-07-22 LAB — LIPID PANEL
Cholesterol: 95 mg/dL (ref 0–200)
HDL: 31.5 mg/dL — ABNORMAL LOW (ref >39.00)
LDL Cholesterol: 54 mg/dL (ref 0–99)
NonHDL: 63.5
Total CHOL/HDL Ratio: 3
Triglycerides: 48 mg/dL (ref 0.0–149.0)
VLDL: 9.6 mg/dL (ref 0.0–40.0)

## 2021-07-22 LAB — HEMOGLOBIN A1C: Hgb A1c MFr Bld: 6.3 % (ref 4.6–6.5)

## 2021-07-22 LAB — COMPREHENSIVE METABOLIC PANEL
ALT: 17 U/L (ref 0–53)
AST: 16 U/L (ref 0–37)
Albumin: 4.5 g/dL (ref 3.5–5.2)
Alkaline Phosphatase: 101 U/L (ref 39–117)
BUN: 14 mg/dL (ref 6–23)
CO2: 30 mEq/L (ref 19–32)
Calcium: 10 mg/dL (ref 8.4–10.5)
Chloride: 104 mEq/L (ref 96–112)
Creatinine, Ser: 1.13 mg/dL (ref 0.40–1.50)
GFR: 66.71 mL/min (ref >60.00)
Glucose, Bld: 99 mg/dL (ref 70–99)
Potassium: 4.1 mEq/L (ref 3.5–5.1)
Sodium: 142 mEq/L (ref 135–145)
Total Bilirubin: 0.8 mg/dL (ref 0.2–1.2)
Total Protein: 6.8 g/dL (ref 6.0–8.3)

## 2021-07-22 LAB — CBC WITH DIFFERENTIAL/PLATELET
Basophils Absolute: 0 10*3/uL (ref 0.0–0.1)
Basophils Relative: 0.6 % (ref 0.0–3.0)
Eosinophils Absolute: 0.1 10*3/uL (ref 0.0–0.7)
Eosinophils Relative: 2.3 % (ref 0.0–5.0)
HCT: 41.6 % (ref 39.0–52.0)
Hemoglobin: 14.1 g/dL (ref 13.0–17.0)
Lymphocytes Relative: 35.5 % (ref 12.0–46.0)
Lymphs Abs: 2 10*3/uL (ref 0.7–4.0)
MCHC: 34 g/dL (ref 30.0–36.0)
MCV: 85.4 fl (ref 78.0–100.0)
Monocytes Absolute: 0.4 10*3/uL (ref 0.1–1.0)
Monocytes Relative: 7.4 % (ref 3.0–12.0)
Neutro Abs: 3 10*3/uL (ref 1.4–7.7)
Neutrophils Relative %: 54.2 % (ref 43.0–77.0)
Platelets: 164 10*3/uL (ref 150.0–400.0)
RBC: 4.87 Mil/uL (ref 4.22–5.81)
RDW: 14.9 % (ref 11.5–15.5)
WBC: 5.5 10*3/uL (ref 4.0–10.5)

## 2021-07-22 LAB — PSA: PSA: 0.94 ng/mL (ref 0.10–4.00)

## 2021-07-22 LAB — MICROALBUMIN / CREATININE URINE RATIO
Creatinine,U: 112.2 mg/dL
Microalb Creat Ratio: 0.6 mg/g (ref 0.0–30.0)
Microalb, Ur: <0.7 mg/dL (ref 0.0–1.9)

## 2021-07-22 LAB — TSH: TSH: 2.45 u[IU]/mL (ref 0.35–5.50)

## 2021-07-22 NOTE — Chronic Care Management (AMB) (Signed)
    Chronic Care Management Pharmacy Assistant   Name: Dylan Lane  MRN: [id number] DOB: [birth year]  07/26/2021 APPOINTMENT [name], Patient wished to reschedule appointment on 07/26/2021 at 1:00 via telephone visit with Jeni Salles, Pharm D due to having had a recent appointment for a physical with his PCP.    Care Gaps: AWV - scheduled 05/12/2022 Last BP - 110/82 on 07/22/2021 Last A1C - 6.3 on 07/22/2021  Star Rating Drug: Atorvastatin '80mg'$  - last filled 06/17/2021 30 DS at [facility] Metformin '500mg'$  - last filled 06/17/2021 30 DS at [facility] Recent [facility] deliveries on 07/22/2021  Any gaps in medications fill history? No  [name] [facility]  Catering manager [phone]
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
## 2021-07-22 NOTE — Patient Instructions (Signed)
It was great seeing you today   We will follow up with you regarding your lab work   Please let me know if you need anything   

## 2021-07-26 ENCOUNTER — Telehealth: Payer: Medicare HMO

## 2021-08-09 ENCOUNTER — Telehealth: Payer: Self-pay

## 2021-08-09 NOTE — Telephone Encounter (Signed)
--  Caller states that he started feeling bad yesterday. He states he has chills, diarrhea, and no appetite.  08/03/2021 1:59:16 [place], RN, [facility] Advice Given Per Guideline HOME CARE: * You should be able to treat this at home. REASSURANCE AND EDUCATION - DIARRHEA: * Diarrhea may be caused by a virus ('stomach flu') or a bacteria. Diarrhea is one of the body's way of getting rid of germs. FLUID THERAPY DURING MILD TO MODERATE DIARRHEA: * Drink more fluids, at least 8 to 10 cups daily. One cup equals 8 oz (240 ml). * WATER: For mild to moderate diarrhea, water is often the best liquid to drink. You should also eat some salty foods (e.g., potato chips, pretzels, saltine crackers). This is important to make sure you are getting enough salt, sugars, and fluids to meet your body's needs. * SPORTS DRINKS: You can also drink half-strength sports drinks (e.g., Gatorade, Powerade) to help treat and prevent dehydration. Mix the sports drink half and half with water. * AVOID caffeinated beverages. Reason: Caffeine is mildly dehydrating. * AVOID alcohol beverages (e.g., beer, wine, hard liquor). * AVOID carbonated soft drinks (soda) as these can make your diarrhea worse. FOOD AND NUTRITION DURING MILD TO MODERATE DIARRHEA: * Maintaining some food intake during episodes of diarrhea is important. * Begin with boiled starches / cereals (e.g., potatoes, rice, noodles, wheat, oats) with a small amount of salt to taste. * You can also eat bananas, yogurt, crackers, and soup. * AVOID milk and dairy products if these make your diarrhea worse. * AVOID greasy, fatty or spicy foods. [place] YOUR HANDS: * Wash your hands after using the bathroom. CALL BACK IF: * Diarrhea lasts over 7 days * You become worse CARE ADVICE given per Diarrhea (Adult) guideline.

## 2021-08-10 ENCOUNTER — Telehealth: Payer: Self-pay | Admitting: Pharmacist

## 2021-08-10 ENCOUNTER — Other Ambulatory Visit: Payer: Self-pay | Admitting: Internal Medicine

## 2021-08-10 DIAGNOSIS — R933 Abnormal findings on diagnostic imaging of other parts of digestive tract: Secondary | ICD-10-CM

## 2021-08-10 DIAGNOSIS — K219 Gastro-esophageal reflux disease without esophagitis: Secondary | ICD-10-CM

## 2021-08-10 NOTE — Chronic Care Management (AMB) (Signed)
Chronic Care Management Pharmacy Assistant   Name: Dylan Lane  MRN: [id number] DOB: 11/18/1950  Reason for Encounter: Medication Review / Medication Coordination Call   Recent office visits:  07/22/2021 [name] - Patient was seen for Routine general medical examination at a health care facility and essential hypertension. No medication changes. No follow up noted.  Recent consult visits:  None  Hospital visits:  None  Medications: Outpatient Encounter Medications as of 08/10/2021  Medication Sig   alfuzosin (UROXATRAL) 10 MG 24 hr tablet Take 10 mg by mouth daily.   amLODipine (NORVASC) 10 MG tablet TAKE ONE TABLET BY MOUTH ONCE DAILY   aspirin 81 MG EC tablet Take 1 tablet (81 mg total) by mouth daily.   atorvastatin (LIPITOR) 80 MG tablet Take 1 tablet (80 mg total) by mouth daily.   clopidogrel (PLAVIX) 75 MG tablet TAKE ONE TABLET BY MOUTH ONCE DAILY   cyclobenzaprine (FLEXERIL) 10 MG tablet Take 1 tablet (10 mg total) by mouth 3 (three) times daily as needed for muscle spasms.   fluticasone (FLONASE) 50 MCG/ACT nasal spray Place 2 sprays into both nostrils daily.   meclizine (ANTIVERT) 25 MG tablet Take 1 tablet (25 mg total) by mouth 3 (three) times daily as needed for dizziness.   metFORMIN (GLUCOPHAGE-XR) 500 MG 24 hr tablet TAKE ONE TABLET BY MOUTH EVERY MORNING with breakfast   metoprolol succinate (TOPROL-XL) 25 MG 24 hr tablet TAKE ONE TABLET BY MOUTH ONCE DAILY   nitroGLYCERIN (NITROSTAT) 0.4 MG SL tablet DISSOLVE 1 TABLET UNDER THE TONGUE EVERY 5 MINUTES AS NEEDED FOR CHEST PAIN. DO NOT EXCEED A TOTAL OF 3 DOSES IN 15 MINUTES.   pantoprazole (PROTONIX) 40 MG tablet Take 1 tablet (40 mg total) by mouth every morning. NEEDS OFFICE VISIT FOR FURTHER REFILLS   Facility-Administered Encounter Medications as of 08/10/2021  Medication   sodium chloride flush (NS) 0.9 % injection 3 mL  Reviewed chart for medication changes ahead of medication coordination  call.  No OVs, Consults, or hospital visits since last care coordination call/Pharmacist visit. (If appropriate, list visit date, provider name)  No medication changes indicated OR if recent visit, treatment plan here.  BP Readings from Last 3 Encounters:  07/22/21 110/82  04/19/21 110/74  03/23/21 120/80    Lab Results  Component Value Date   HGBA1C 6.3 07/22/2021     Patient obtains medications through Vials  30 Days    Last adherence delivery included:  Alfuzosin ER '10mg'$  - 1 tablet once daily Metoprolol succinate '25mg'$  - 1 tablet once daily Pantoprazole '40mg'$  - 1 tablet with breakfast Clopidogrel 75 mg - 1 tablet once daily  Amlodipine '10mg'$  - 1 tablet once daily Atorvastatin '80mg'$  - 1 tablet once daily Metformin ER 500 mg - 1 tablet with breakfast   Patient declined (meds) last month:  Flonase NS 50 mcg/act - 2 sprays in each nostril daily Nitroglycerin 0.4 mg - take 1 tablet under the tongue every 5 minutes as needed for chest pain   Patient is due for next adherence delivery on: 08/23/2021     Called patient and reviewed medications and coordinated delivery.   This delivery to include: Alfuzosin ER '10mg'$  - 1 tablet once daily Metformin ER 500 mg - 1 tablet with breakfast Metoprolol succinate '25mg'$  - 1 tablet once daily Pantoprazole '40mg'$  - 1 tablet with breakfast Clopidogrel 75 mg - 1 tablet once daily  Amlodipine '10mg'$  - 1 tablet once daily Atorvastatin '80mg'$  - 1  tablet once daily Nitroglycerin 0.4 mg - take 1 tablet under the tongue every 5 minutes as needed for chest pain   Patient will need a short fill: No short fills needed   Coordinated acute fill: No acute fills needed   Patient declined the following medications: He has plenty on hand Flonase NS 50 mcg/act - 2 sprays in each nostril daily   Confirmed delivery date of 08/23/2021 advised patient that pharmacy will contact them the morning of delivery.  Care Gaps: AWV - scheduled 05/12/2022 Last BP - 110/82  on 07/22/2021 Last A1C - 6.3 on 07/22/2021 Covid booster - overdue Flu - due Colonoscopy - postponed  Star Rating Drugs: Atorvastatin '80mg'$  - last filled 07/15/2021 30 DS at [facility] Metformin '500mg'$  - last filled 07/15/2021 30 DS at [facility] [phone]

## 2021-08-19 NOTE — Progress Notes (Signed)
Patient notified he will need to schedule a follow up with Dr. Hilarie Fredrickson. Refill denied with [facility] until appointment is made. Patient voiced understanding.

## 2021-09-07 ENCOUNTER — Telehealth: Payer: Self-pay | Admitting: Adult Health

## 2021-09-07 ENCOUNTER — Other Ambulatory Visit: Payer: Self-pay

## 2021-09-07 ENCOUNTER — Emergency Department (HOSPITAL_COMMUNITY)
Admission: EM | Admit: 2021-09-07 | Discharge: 2021-09-07 | Disposition: A | Discharge status: Home / Self Care | Payer: Medicare HMO | Attending: Emergency Medicine | Admitting: Emergency Medicine

## 2021-09-07 ENCOUNTER — Encounter (HOSPITAL_COMMUNITY): Payer: Self-pay | Admitting: Emergency Medicine

## 2021-09-07 DIAGNOSIS — Z7902 Long term (current) use of antithrombotics/antiplatelets: Secondary | ICD-10-CM | POA: Insufficient documentation

## 2021-09-07 DIAGNOSIS — M62838 Other muscle spasm: Secondary | ICD-10-CM | POA: Insufficient documentation

## 2021-09-07 DIAGNOSIS — X58XXXA Exposure to other specified factors, initial encounter: Secondary | ICD-10-CM | POA: Diagnosis not present

## 2021-09-07 DIAGNOSIS — I2510 Atherosclerotic heart disease of native coronary artery without angina pectoris: Secondary | ICD-10-CM | POA: Diagnosis not present

## 2021-09-07 DIAGNOSIS — I10 Essential (primary) hypertension: Secondary | ICD-10-CM | POA: Insufficient documentation

## 2021-09-07 DIAGNOSIS — Z79899 Other long term (current) drug therapy: Secondary | ICD-10-CM | POA: Diagnosis not present

## 2021-09-07 DIAGNOSIS — Z7982 Long term (current) use of aspirin: Secondary | ICD-10-CM | POA: Diagnosis not present

## 2021-09-07 DIAGNOSIS — S161XXA Strain of muscle, fascia and tendon at neck level, initial encounter: Secondary | ICD-10-CM | POA: Diagnosis not present

## 2021-09-07 DIAGNOSIS — S169XXA Unspecified injury of muscle, fascia and tendon at neck level, initial encounter: Secondary | ICD-10-CM | POA: Diagnosis present

## 2021-09-07 MED ORDER — IBUPROFEN 800 MG PO TABS
800.0000 mg | ORAL_TABLET | Freq: Once | ORAL | Status: AC
Start: 2021-09-07 — End: 2021-09-07
  Administered 2021-09-07: 800 mg via ORAL
  Filled 2021-09-07: qty 1

## 2021-09-07 NOTE — ED Provider Notes (Signed)
[facility] EMERGENCY DEPARTMENT Provider Note   CSN: [id number] Arrival date & time: 09/07/21  1255     History  Chief Complaint  Patient presents with   Neck Pain    Dylan Lane is a 71 y.o. male with history of HLD, HTN, GERD, CAD s/p cath in 2021, arthritis, and scoliosis who presents to the ER complaining of right sided neck pain starting this morning. Patient states he noticed the pain while getting out of bed. He took some tylenol without improvement. Reports previous history of similar, but it has been several years. Denies any trauma. States the pain is constant but worse with movement. Reports intermittent muscle spasms. No numbness, weakness, blurry vision, slurred speech, facial droop, fever, chills.    Neck Pain Associated symptoms: no fever, no headaches, no numbness and no weakness        Home Medications Prior to Admission medications   Medication Sig Start Date End Date Taking? Authorizing Provider  alfuzosin (UROXATRAL) 10 MG 24 hr tablet Take 10 mg by mouth daily. 04/19/20   [provider]  amLODipine (NORVASC) 10 MG tablet TAKE ONE TABLET BY MOUTH ONCE DAILY 05/12/21   [name], NP  aspirin 81 MG EC tablet Take 1 tablet (81 mg total) by mouth daily. 12/20/13   Jessee Avers, MD  atorvastatin (LIPITOR) 80 MG tablet Take 1 tablet (80 mg total) by mouth daily. 12/11/20   Belva Crome, MD  clopidogrel (PLAVIX) 75 MG tablet TAKE ONE TABLET BY MOUTH ONCE DAILY 06/10/21   Belva Crome, MD  cyclobenzaprine (FLEXERIL) 10 MG tablet Take 1 tablet (10 mg total) by mouth 3 (three) times daily as needed for muscle spasms. 10/13/20   Nafziger, Tommi Rumps, NP  fluticasone (FLONASE) 50 MCG/ACT nasal spray Place 2 sprays into both nostrils daily. 03/23/21   Nafziger, Tommi Rumps, NP  meclizine (ANTIVERT) 25 MG tablet Take 1 tablet (25 mg total) by mouth 3 (three) times daily as needed for dizziness. 03/23/21   Nafziger, Tommi Rumps, NP  metFORMIN  (GLUCOPHAGE-XR) 500 MG 24 hr tablet TAKE ONE TABLET BY MOUTH EVERY MORNING with breakfast 06/10/21   [name], NP  metoprolol succinate (TOPROL-XL) 25 MG 24 hr tablet TAKE ONE TABLET BY MOUTH ONCE DAILY 03/10/21   Belva Crome, MD  nitroGLYCERIN (NITROSTAT) 0.4 MG SL tablet DISSOLVE 1 TABLET UNDER THE TONGUE EVERY 5 MINUTES AS NEEDED FOR CHEST PAIN. DO NOT EXCEED A TOTAL OF 3 DOSES IN 15 MINUTES. 03/01/21   Belva Crome, MD  pantoprazole (PROTONIX) 40 MG tablet Take 1 tablet (40 mg total) by mouth every morning. NEEDS OFFICE VISIT FOR FURTHER REFILLS 05/12/21   [name], [name], MD      Allergies    Amoxicillin    Review of Systems   Review of Systems  Constitutional:  Negative for chills and fever.  Eyes:  Negative for visual disturbance.  Musculoskeletal:  Positive for neck pain. Negative for back pain and gait problem.  Neurological:  Negative for dizziness, speech difficulty, weakness, numbness and headaches.  All other systems reviewed and are negative.   Physical Exam Updated Vital Signs BP 105/73   Pulse (!) 54   Temp 97.6 F (36.4 C) (Oral)   Resp 17   Ht '5\' 7"'$  (1.702 m)   Wt 83.9 kg   SpO2 98%   BMI 28.98 kg/m  Physical Exam Vitals and nursing note reviewed.  Constitutional:      Appearance: Normal appearance.  HENT:  Head: Normocephalic and atraumatic.  Eyes:     Conjunctiva/sclera: Conjunctivae normal.  Neck:     Vascular: No JVD.     Trachea: No tracheal deviation.     Meningeal: Brudzinski's sign and Kernig's sign absent.     Comments: Right sided muscular pain. Intermittent spasms with movement.  Pulmonary:     Effort: Pulmonary effort is normal. No respiratory distress.  Musculoskeletal:     Cervical back: No rigidity or crepitus. Pain with movement and muscular tenderness present. No spinous process tenderness. Decreased range of motion.  Lymphadenopathy:     Cervical: No cervical adenopathy.  Skin:    General: Skin is warm and dry.   Neurological:     Mental Status: He is alert.     Comments: 5/5 strength in all extremities and sensation intact  Psychiatric:        Mood and Affect: Mood normal.        Behavior: Behavior normal.     ED Results / Procedures / Treatments   Labs (all labs ordered are listed, but only abnormal results are displayed) Labs Reviewed - No data to display  EKG None  Radiology No results found.  Procedures Procedures    Medications Ordered in ED Medications  ibuprofen (ADVIL) tablet 800 mg (has no administration in time range)    ED Course/ Medical Decision Making/ A&P                           Medical Decision Making Risk Prescription drug management.   This patient is a 71 y.o. male who presents to the ED for concern of right sided neck pain upon waking this AM. No trauma reported.   Differential diagnoses prior to evaluation: Musculoskeletal pain, meningitis, lymphadenitis, CVA  Past Medical History / Social History / Additional history: Chart reviewed. Pertinent results include: history of HLD, HTN, GERD, CAD s/p cath in 2021, arthritis, and scoliosis  Physical Exam: Physical exam performed. The pertinent findings include: Right sided muscular pain without spinous process tenderness. Pain worsened with movement with slightly decreased right neck rotation due to pain. No meningeal signs.   Medications / Treatment: Patient given ibuprofen   Disposition: After consideration of the diagnostic results and the patients response to treatment, I feel that emergency department workup does not suggest an emergent condition requiring admission or immediate intervention beyond what has been performed at this time. The plan is: discharge to home with symptomatic treatment of likely cervical strain. Low concern for fractures, dislocations, infection or CVA based on reassuring physical exam. Offered prescription of muscle relaxer and patient declined. The patient is safe for  discharge and has been instructed to return immediately for worsening symptoms, change in symptoms or any other concerns.   Final Clinical Impression(s) / ED Diagnoses Final diagnoses:  Acute strain of neck muscle, initial encounter  Muscle spasms of neck    Rx / DC Orders ED Discharge Orders     None      Portions of this report may have been transcribed using voice recognition software. Every effort was made to ensure accuracy; however, inadvertent computerized transcription errors may be present.    Estill Cotta 09/07/21 2012    Lajean Saver, MD 09/07/21 2109

## 2021-09-07 NOTE — Discharge Instructions (Addendum)
You were seen in the emergency department today for neck pain.  As we discussed I think your pain is related to the muscles in your neck.  I think you likely strained it last night while you are sleeping.  I recommend taking 800 mg of ibuprofen every 6 hours as needed for pain.  You can use heating pad, as well as topical creams like IcyHot or Biofreeze.  I have attached some stretches that you can do.  I would try to do these as much as tolerated.  But do not push yourself too hard.  Continue to monitor how you're doing and return to the ER for new or worsening symptoms.

## 2021-09-07 NOTE — Telephone Encounter (Signed)
Please advise 

## 2021-09-07 NOTE — ED Notes (Signed)
Patient verbalizes understanding of discharge instructions. Opportunity for questioning and answers were provided. Armband removed by staff, pt discharged from ED. Pt taken to ED waiting room via wheel chair.  

## 2021-09-07 NOTE — Telephone Encounter (Signed)
c/o spasms in shoulder area, pain radiates to back of head, started this morning seeking guidance on how to treat this, declined OV

## 2021-09-07 NOTE — ED Triage Notes (Signed)
Patient arrives by POV c/o right sided neck and shoulder pain onset of waking up this morning. Patient denies any injury. Has taken 3 tylenol today w/o relief.

## 2021-09-07 NOTE — ED Provider Triage Note (Cosign Needed Addendum)
Emergency Medicine Provider Triage Evaluation Note  Dylan Lane , a 71 y.o. male  was evaluated in triage.  Pt complains of neck pain that began this morning after awaking from sleep.  History of same in the past.  Review of Systems  Positive: Neck pain Negative: Chest pain, shortness of breath  Physical Exam  BP (!) 145/91 (BP Location: Left Arm)   Pulse 64   Temp 98 F (36.7 C) (Oral)   Resp 17   Ht '5\' 7"'$  (1.702 m)   Wt 83.9 kg   SpO2 98%   BMI 28.98 kg/m  Gen:   Awake, no distress   Resp:  Normal effort  MSK:   Moves extremities without difficulty patient does have pain with rotation of the neck to the right.  Tender palpation over the right sternocleidomastoid muscle.  Full range of motion of the right shoulder without pain.  Radial pulse are 2+.  Grip strength is equal bilaterally. Other:  Heart regular rate and rhythm.  Lungs clear to auscultation bilaterally.  Medical Decision Making  Medically screening exam initiated at 1:55 PM.  Appropriate orders placed.  [name] was informed that the remainder of the evaluation will be completed by another provider, this initial triage assessment does not replace that evaluation, and the importance of remaining in the ED until their evaluation is complete.  Patient presents for neck pain.  Denies trauma. Patient is neurovascularly intact.  Denies chest pain or shortness of breath low risk for cardiac etiology at this time.  Presentation not consistent with CVA.   [name], PA-C 09/07/21 1358    [name], PA-C 09/07/21 1358

## 2021-09-08 ENCOUNTER — Telehealth: Payer: Self-pay | Admitting: Pharmacist

## 2021-09-08 NOTE — Telephone Encounter (Signed)
Pt stated that he was helped regarding the matter yesterday. No further actions needed.

## 2021-09-08 NOTE — Chronic Care Management (AMB) (Signed)
Chronic Care Management Pharmacy Assistant   Name: Reginal Wojcicki  MRN: [id number] DOB: Jun 17, 1950  Reason for Encounter: Medication Review / Medication Coordination Call   Recent office visits:  None  Recent consult visits:  None  Hospital visits:  Patient was seen at [facility] ED on 09/07/2021 (3 hours) due to Acute strain of neck muscle, initial encounter.    New?Medications Started at [facility] Discharge:?? No new medications Medication Changes at Hospital Discharge: No medication changes. Medications Discontinued at Hospital Discharge: No medications discontinued Medications that remain the same after Hospital Discharge:??  -All other medications will remain the same.    Medications: Outpatient Encounter Medications as of 09/08/2021  Medication Sig   alfuzosin (UROXATRAL) 10 MG 24 hr tablet Take 10 mg by mouth daily.   amLODipine (NORVASC) 10 MG tablet TAKE ONE TABLET BY MOUTH ONCE DAILY   aspirin 81 MG EC tablet Take 1 tablet (81 mg total) by mouth daily.   atorvastatin (LIPITOR) 80 MG tablet Take 1 tablet (80 mg total) by mouth daily.   clopidogrel (PLAVIX) 75 MG tablet TAKE ONE TABLET BY MOUTH ONCE DAILY   cyclobenzaprine (FLEXERIL) 10 MG tablet Take 1 tablet (10 mg total) by mouth 3 (three) times daily as needed for muscle spasms.   fluticasone (FLONASE) 50 MCG/ACT nasal spray Place 2 sprays into both nostrils daily.   meclizine (ANTIVERT) 25 MG tablet Take 1 tablet (25 mg total) by mouth 3 (three) times daily as needed for dizziness.   metFORMIN (GLUCOPHAGE-XR) 500 MG 24 hr tablet TAKE ONE TABLET BY MOUTH EVERY MORNING with breakfast   metoprolol succinate (TOPROL-XL) 25 MG 24 hr tablet TAKE ONE TABLET BY MOUTH ONCE DAILY   nitroGLYCERIN (NITROSTAT) 0.4 MG SL tablet DISSOLVE 1 TABLET UNDER THE TONGUE EVERY 5 MINUTES AS NEEDED FOR CHEST PAIN. DO NOT EXCEED A TOTAL OF 3 DOSES IN 15 MINUTES.   pantoprazole (PROTONIX) 40 MG tablet Take 1 tablet (40  mg total) by mouth every morning. NEEDS OFFICE VISIT FOR FURTHER REFILLS   Facility-Administered Encounter Medications as of 09/08/2021  Medication   sodium chloride flush (NS) 0.9 % injection 3 mL   Reviewed chart for medication changes ahead of medication coordination call.  BP Readings from Last 3 Encounters:  09/07/21 132/79  07/22/21 110/82  04/19/21 110/74    Lab Results  Component Value Date   HGBA1C 6.3 07/22/2021     Patient obtains medications through Vials  30 Days    Last adherence delivery included:  Alfuzosin ER '10mg'$  - 1 tablet once daily Metformin ER 500 mg - 1 tablet with breakfast Metoprolol succinate '25mg'$  - 1 tablet once daily Pantoprazole '40mg'$  - 1 tablet with breakfast Clopidogrel 75 mg - 1 tablet once daily  Amlodipine '10mg'$  - 1 tablet once daily Atorvastatin '80mg'$  - 1 tablet once daily Nitroglycerin 0.4 mg - take 1 tablet under the tongue every 5 minutes as needed for chest pain   Patient declined (meds) last month:  Flonase NS 50 mcg/act - 2 sprays in each nostril daily   Patient is due for next adherence delivery on: 09/21/2021     Called patient and reviewed medications and coordinated delivery.   This delivery to include: Alfuzosin ER '10mg'$  - 1 tablet once daily Metformin ER 500 mg - 1 tablet with breakfast Metoprolol succinate '25mg'$  - 1 tablet once daily Clopidogrel 75 mg - 1 tablet once daily  Amlodipine '10mg'$  - 1 tablet once daily Atorvastatin '80mg'$  -  1 tablet once daily Nitroglycerin 0.4 mg - take 1 tablet under the tongue every 5 minutes as needed for chest pain   Patient will need a short fill: No short fills needed   Coordinated acute fill: No acute fills needed   Patient declined the following medications:  Flonase NS 50 mcg/act - 2 sprays in each nostril daily -  patient has plenty on hand Pantoprazole '40mg'$  - 1 tablet with breakfast - patient has not made follow up visit with Dr. Billie Lade, he plans to do this and is aware this medication  will not be able to be delivered to him until he makes an appointment and Dr. Hilarie Fredrickson sends a new prescription to [facility].   Confirmed delivery date of 09/21/2021 advised patient that pharmacy will contact them the morning of delivery.   Care Gaps: AWV - scheduled 05/12/2022 Last BP - 132/79 on 09/07/2021 Last A1C - 6.3 on 07/22/2021 Covid booster - overdue Flu - due Colonoscopy - postponed  Star Rating Drugs: Atorvastatin '80mg'$  - last filled 08/18/2021 30 DS at [facility] Metformin '500mg'$  - last filled 08/18/2021 30 DS at [facility] [phone]

## 2021-09-13 ENCOUNTER — Telehealth: Payer: Self-pay | Admitting: Adult Health

## 2021-09-13 DIAGNOSIS — I252 Old myocardial infarction: Secondary | ICD-10-CM | POA: Diagnosis not present

## 2021-09-13 DIAGNOSIS — E785 Hyperlipidemia, unspecified: Secondary | ICD-10-CM | POA: Diagnosis not present

## 2021-09-13 DIAGNOSIS — I25119 Atherosclerotic heart disease of native coronary artery with unspecified angina pectoris: Secondary | ICD-10-CM | POA: Diagnosis not present

## 2021-09-13 DIAGNOSIS — Z7982 Long term (current) use of aspirin: Secondary | ICD-10-CM | POA: Diagnosis not present

## 2021-09-13 DIAGNOSIS — E669 Obesity, unspecified: Secondary | ICD-10-CM | POA: Diagnosis not present

## 2021-09-13 DIAGNOSIS — I10 Essential (primary) hypertension: Secondary | ICD-10-CM | POA: Diagnosis not present

## 2021-09-13 DIAGNOSIS — J309 Allergic rhinitis, unspecified: Secondary | ICD-10-CM | POA: Diagnosis not present

## 2021-09-13 DIAGNOSIS — K219 Gastro-esophageal reflux disease without esophagitis: Secondary | ICD-10-CM | POA: Diagnosis not present

## 2021-09-13 DIAGNOSIS — Z6829 Body mass index (BMI) 29.0-29.9, adult: Secondary | ICD-10-CM | POA: Diagnosis not present

## 2021-09-13 DIAGNOSIS — Z7902 Long term (current) use of antithrombotics/antiplatelets: Secondary | ICD-10-CM | POA: Diagnosis not present

## 2021-09-13 DIAGNOSIS — Z008 Encounter for other general examination: Secondary | ICD-10-CM | POA: Diagnosis not present

## 2021-09-13 DIAGNOSIS — E119 Type 2 diabetes mellitus without complications: Secondary | ICD-10-CM | POA: Diagnosis not present

## 2021-09-13 DIAGNOSIS — N400 Enlarged prostate without lower urinary tract symptoms: Secondary | ICD-10-CM | POA: Diagnosis not present

## 2021-09-13 NOTE — Telephone Encounter (Signed)
Requesting a call to discuss A1C

## 2021-09-15 NOTE — Telephone Encounter (Signed)
Spoke to pt regarding A1c. Pt just wanted to know his readings. Pt stated that he had another issue. Pt informed me that when he takes his medications in the morning, by the afternoon 1 pill comes up with the gel coating. Pt stated that this is his heart medication and he wants to know what he should do.

## 2021-09-16 NOTE — Telephone Encounter (Signed)
Patient notified of update  and verbalized understanding. 

## 2021-10-05 ENCOUNTER — Ambulatory Visit (INDEPENDENT_AMBULATORY_CARE_PROVIDER_SITE_OTHER): Payer: Medicare HMO | Admitting: Adult Health

## 2021-10-05 VITALS — BP 116/82 | HR 57 | Temp 97.8°F | Wt 184.8 lb

## 2021-10-05 DIAGNOSIS — R634 Abnormal weight loss: Secondary | ICD-10-CM | POA: Diagnosis not present

## 2021-10-05 DIAGNOSIS — T50905A Adverse effect of unspecified drugs, medicaments and biological substances, initial encounter: Secondary | ICD-10-CM | POA: Diagnosis not present
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
## 2021-10-08 ENCOUNTER — Telehealth: Payer: Self-pay | Admitting: Pharmacist

## 2021-10-08 NOTE — Chronic Care Management (AMB) (Signed)
Chronic Care Management Pharmacy Assistant   Name: Dylan Lane  MRN: [id number] DOB: August 28, 1950  Reason for Encounter: Medication Review / Medication Coordination Call   Recent office visits:  10/05/2021 [name] - Patient was seen for weight loss due to medication. No medication changes. No follow up noted.   Recent consult visits:  None  Hospital visits:  None  Medications: Outpatient Encounter Medications as of 10/08/2021  Medication Sig   alfuzosin (UROXATRAL) 10 MG 24 hr tablet Take 10 mg by mouth daily.   amLODipine (NORVASC) 10 MG tablet TAKE ONE TABLET BY MOUTH ONCE DAILY   aspirin 81 MG EC tablet Take 1 tablet (81 mg total) by mouth daily.   atorvastatin (LIPITOR) 80 MG tablet Take 1 tablet (80 mg total) by mouth daily.   clopidogrel (PLAVIX) 75 MG tablet TAKE ONE TABLET BY MOUTH ONCE DAILY   cyclobenzaprine (FLEXERIL) 10 MG tablet Take 1 tablet (10 mg total) by mouth 3 (three) times daily as needed for muscle spasms.   fluticasone (FLONASE) 50 MCG/ACT nasal spray Place 2 sprays into both nostrils daily. (Patient not taking: Reported on 10/05/2021)   meclizine (ANTIVERT) 25 MG tablet Take 1 tablet (25 mg total) by mouth 3 (three) times daily as needed for dizziness. (Patient not taking: Reported on 10/05/2021)   metFORMIN (GLUCOPHAGE-XR) 500 MG 24 hr tablet TAKE ONE TABLET BY MOUTH EVERY MORNING with breakfast   metoprolol succinate (TOPROL-XL) 25 MG 24 hr tablet TAKE ONE TABLET BY MOUTH ONCE DAILY   nitroGLYCERIN (NITROSTAT) 0.4 MG SL tablet DISSOLVE 1 TABLET UNDER THE TONGUE EVERY 5 MINUTES AS NEEDED FOR CHEST PAIN. DO NOT EXCEED A TOTAL OF 3 DOSES IN 15 MINUTES.   pantoprazole (PROTONIX) 40 MG tablet Take 1 tablet (40 mg total) by mouth every morning. NEEDS OFFICE VISIT FOR FURTHER REFILLS   Facility-Administered Encounter Medications as of 10/08/2021  Medication   sodium chloride flush (NS) 0.9 % injection 3 mL   Reviewed chart for medication changes  ahead of medication coordination call.  BP Readings from Last 3 Encounters:  10/05/21 116/82  09/07/21 132/79  07/22/21 110/82    Lab Results  Component Value Date   HGBA1C 6.3 07/22/2021     Patient obtains medications through [facility]  30 Days    Last adherence delivery included:  Alfuzosin ER '10mg'$  - 1 tablet once daily Metformin ER 500 mg - 1 tablet with breakfast Metoprolol succinate '25mg'$  - 1 tablet once daily Clopidogrel 75 mg - 1 tablet once daily  Amlodipine '10mg'$  - 1 tablet once daily Atorvastatin '80mg'$  - 1 tablet once daily Nitroglycerin 0.4 mg - take 1 tablet under the tongue every 5 minutes as needed for chest pain   Patient declined (meds) last month:  Flonase NS 50 mcg/act - 2 sprays in each nostril daily -  patient has plenty on hand Pantoprazole '40mg'$  - 1 tablet with breakfast - patient has not made follow up visit with Dr. Billie Lade, he plans to do this and is aware this medication will not be able to be delivered to him until he makes an appointment and Dr. Hilarie Fredrickson sends a new prescription to [facility].   Patient is due for next adherence delivery on: 10/20/2021     Called patient and reviewed medications and coordinated delivery.   This delivery to include: Alfuzosin ER '10mg'$  - 1 tablet once daily Metformin ER 500 mg - 1 tablet with breakfast Metoprolol succinate '25mg'$  - 1 tablet once daily  Clopidogrel 75 mg - 1 tablet once daily  Amlodipine '10mg'$  - 1 tablet once daily Atorvastatin '80mg'$  - 1 tablet once daily Nitroglycerin 0.4 mg - take 1 tablet under the tongue every 5 minutes as needed for chest pain   Patient will need a short fill: No short fills needed   Coordinated acute fill: No acute fills needed   Patient declined the following medications:  Flonase NS 50 mcg/act - 2 sprays in each nostril daily Pantoprazole '40mg'$  - 1 tablet with breakfast (patient reminded he needs to make appt. With Dr Hilarie Fredrickson before they will send a refill. Pt voices clear  understanding)  Confirmed delivery date of 10/20/2021 advised patient that pharmacy will contact them the morning of delivery.     Care Gaps: AWV - scheduled 05/12/2022 Last BP - 116/82 on 10/05/2021 Last A1C - 6.3 on 07/22/2021 Covid booster - overdue Flu - due Colonoscopy - postponed   Star Rating Drugs: Atorvastatin '80mg'$  - last filled 09/15/2021 30 DS at [facility] Metformin '500mg'$  - last filled 09/15/2021 30 DS at [facility] [phone]

## 2021-10-12 ENCOUNTER — Telehealth: Payer: Self-pay | Admitting: Interventional Cardiology

## 2021-10-12 NOTE — Telephone Encounter (Signed)
Called pt in regards to dry mouth and difficulty swallowing.  Reports has occurred since had heart attack.  Feels as if when swallows especially medications comes back up.  Has burning sensation in throat sometimes.  Is no longer taking pantoprazole as thought it may be cause of dry mouth.  Advised that with symptoms may need medication.  Informed of OTC Biotene dry mouth spray and lozenges.  Advised to f/u with Dr. Hilarie Fredrickson with Gastroenterology.  Pt reports has OV scheduled for 10/13/21.  Advised pt to report to OV and if needs  cardiac assistance to call back.

## 2021-10-12 NOTE — Telephone Encounter (Signed)
Patient states his mouth occasionally becomes dry and he has difficulty swallowing. He would like to know if Dr. Tamala Julian has any recommendations for this. Please advise.

## 2021-10-13 ENCOUNTER — Encounter: Payer: Self-pay | Admitting: Gastroenterology

## 2021-10-13 ENCOUNTER — Ambulatory Visit (INDEPENDENT_AMBULATORY_CARE_PROVIDER_SITE_OTHER): Payer: Medicare HMO | Admitting: Gastroenterology

## 2021-10-13 VITALS — BP 118/80 | HR 77 | Ht 67.0 in | Wt 187.8 lb

## 2021-10-13 DIAGNOSIS — R1312 Dysphagia, oropharyngeal phase: Secondary | ICD-10-CM | POA: Diagnosis not present

## 2021-10-13 DIAGNOSIS — K219 Gastro-esophageal reflux disease without esophagitis: Secondary | ICD-10-CM

## 2021-10-13 DIAGNOSIS — K117 Disturbances of salivary secretion: Secondary | ICD-10-CM

## 2021-10-13 DIAGNOSIS — R682 Dry mouth, unspecified: Secondary | ICD-10-CM | POA: Diagnosis not present

## 2021-10-13 MED ORDER — PANTOPRAZOLE SODIUM 40 MG PO TBEC: 40.0000 mg | DELAYED_RELEASE_TABLET | Freq: Two times a day (BID) | ORAL | 5 refills | Status: DC

## 2021-10-13 NOTE — Patient Instructions (Addendum)
_______________________________________________________  If you are age 69 or older, your body mass index should be between 23-30. Your Body mass index is 29.41 kg/m. If this is out of the aforementioned range listed, please consider follow up with your Primary Care Provider. ________________________________________________________  The [facility] providers would like to encourage you to use [facility] to communicate with providers for non-urgent requests or questions.  Due to long hold times on the telephone, sending your provider a message by [facility] may be a faster and more efficient way to get a response.  Please allow 48 business hours for a response.  Please remember that this is for non-urgent requests.  _______________________________________________________  Dylan Lane have been referred to ENT.  Someone from their office will contact you to schedule this appointment.  We have sent the following medications to your pharmacy for you to pick up at your convenience:  INCREASE: pantoprazole '40mg'$  one tablet twice daily  Thank you for entrusting me with your care and choosing [facility].  Alonza Bogus, PA-C
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
## 2021-10-19 ENCOUNTER — Telehealth: Payer: Self-pay | Admitting: Adult Health

## 2021-10-19 NOTE — Telephone Encounter (Signed)
Pt called requesting to speak with CMA. Pt was asked if it had to do with Rx refills, as I could help her with that. Pt said no, and only wanted to speak to CMA. Pt asked that CMA call back at their earliest convenience.

## 2021-10-20 NOTE — Telephone Encounter (Signed)
Pt called again stating he never got a call back and is now requesting a referral to see and ENT. Please advise.

## 2021-10-20 NOTE — Telephone Encounter (Signed)
Spoke to pt and he stated that he is still having trouble swallowing. Pt stated that he had the swallow test again and Dr. Anderson Malta stated that he may need an ENT referral for the issue.

## 2021-10-21 NOTE — Progress Notes (Signed)
Addendum: Reviewed and agree with assessment and management plan. Arnitra Sokoloski M, MD  

## 2021-10-21 NOTE — Telephone Encounter (Signed)
Patient notified of update  and verbalized understanding. 

## 2021-10-22 ENCOUNTER — Telehealth: Payer: Self-pay | Admitting: Pharmacist

## 2021-10-22 NOTE — Chronic Care Management (AMB) (Signed)
    Chronic Care Management Pharmacy Assistant   Name: Dylan Lane  MRN: [id number] DOB: 1950-07-22  10/22/21 APPOINTMENT REMINDER    Patient was reminded to have all medications, supplements and any blood glucose and blood pressure readings available for review with Jeni Salles, Pharm. D, for telephone visit on 10/25/21 at 10:30.    Care Gaps: COVID Booster - Overdue Flu Vaccine - Overdue Colonoscopy - Postponed Lab Results  Component Value Date   HGBA1C 6.3 07/22/2021    Star Rating Drug:  Atorvastatin '80mg'$  - last filled 10/20/2021 30 DS at [facility] Metformin '500mg'$  - last filled 10/20/2021 30 DS at [facility]   Medications: Outpatient Encounter Medications as of 10/22/2021  Medication Sig   alfuzosin (UROXATRAL) 10 MG 24 hr tablet Take 10 mg by mouth daily.   alfuzosin (UROXATRAL) 10 MG 24 hr tablet Take 1 tablet by mouth daily.   amLODipine (NORVASC) 10 MG tablet TAKE ONE TABLET BY MOUTH ONCE DAILY   aspirin 81 MG EC tablet Take 1 tablet (81 mg total) by mouth daily.   atorvastatin (LIPITOR) 80 MG tablet Take 1 tablet (80 mg total) by mouth daily.   clopidogrel (PLAVIX) 75 MG tablet TAKE ONE TABLET BY MOUTH ONCE DAILY   cyclobenzaprine (FLEXERIL) 10 MG tablet Take 1 tablet (10 mg total) by mouth 3 (three) times daily as needed for muscle spasms.   metFORMIN (GLUCOPHAGE-XR) 500 MG 24 hr tablet TAKE ONE TABLET BY MOUTH EVERY MORNING with breakfast   metoprolol succinate (TOPROL-XL) 25 MG 24 hr tablet TAKE ONE TABLET BY MOUTH ONCE DAILY   nitroGLYCERIN (NITROSTAT) 0.4 MG SL tablet DISSOLVE 1 TABLET UNDER THE TONGUE EVERY 5 MINUTES AS NEEDED FOR CHEST PAIN. DO NOT EXCEED A TOTAL OF 3 DOSES IN 15 MINUTES.   pantoprazole (PROTONIX) 40 MG tablet Take 1 tablet (40 mg total) by mouth 2 (two) times daily.   pravastatin (PRAVACHOL) 40 MG tablet Take 1 tablet by mouth daily.   Facility-Administered Encounter Medications as of 10/22/2021  Medication   sodium chloride  flush (NS) 0.9 % injection 3 mL      [name] [place] Clinical Pharmacist Assistant [phone]

## 2021-10-22 NOTE — Progress Notes (Unsigned)
Chronic Care Management Pharmacy Note  10/25/2021 Name:  Dylan Lane MRN:  [id number] DOB:  06-23-1950  Summary: A1c at goal < 7% BP at goal < 130/80 but patient does not check at home Pt is having significant dry mouth   Recommendations/Changes made from today's visit: -Consider glucometer prescription for checking blood sugars as needed -Recommended weekly BP monitoring at home -Recommended discussing with urology about changing to tamsulosin due to dry mouth -Recommended repeat sleep study   Plan: Follow up after discussion with PCP BP assessment in 3 months  Subjective: Dylan Lane is an 71 y.o. year old male who is a primary patient of [name], NP.  The CCM team was consulted for assistance with disease management and care coordination needs.    Engaged with patient by telephone for follow up visit in response to provider referral for pharmacy case management and/or care coordination services.   Consent to Services:  The patient was given information about Chronic Care Management services, agreed to services, and gave verbal consent prior to initiation of services.  Please see initial visit note for detailed documentation.   Patient Care Team: [name], NP as PCP - General (Family Medicine) Belva Crome, MD as PCP - Cardiology (Cardiology) [facility], [facility] (Specialist) [name], [name], MD as Consulting Physician (Gastroenterology) Viona Gilmore, [facility] as Pharmacist (Pharmacist)  Recent office visits: 10/05/2021 [name] - Patient was seen for weight loss due to medication. No medication changes.  07/22/2021 [name]: Patient presented for annual exam. No medication changes. Follow up in 6 months.  05/11/2021 [name] - Patient was seen for viral illness. No medication changes. Advised he can take Coricidin HBP. Follow up if symptoms fail to resolve or get worse over the next 2-3 days.   05/10/2021 Rolene Arbour LPN -  Medicare annual wellness exam.  Recent consult visits: 10/13/21 Alonza Bogus, PA-C (gastroenterology): Patient presented for GERD and dysphagia . Referred to ENT. Restarted pantoprazole one tablet twice daily.  Plan to take this twice daily for a few months and then back down to once daily.  Hospital visits: Patient was seen at [facility] ED on 09/07/2021 (3 hours) due to Acute strain of neck muscle, initial encounter.    New?Medications Started at [facility] Discharge:?? No new medications Medication Changes at Hospital Discharge: No medication changes. Medications Discontinued at Hospital Discharge: No medications discontinued Medications that remain the same after Hospital Discharge:??  -All other medications will remain the same.    Objective:  Lab Results  Component Value Date   CREATININE 1.13 07/22/2021   BUN 14 07/22/2021   GFR 66.71 07/22/2021   GFRNONAA >60 11/20/2019   GFRAA 70 10/08/2019   NA 142 07/22/2021   K 4.1 07/22/2021   CALCIUM 10.0 07/22/2021   CO2 30 07/22/2021   GLUCOSE 99 07/22/2021    Lab Results  Component Value Date/Time   HGBA1C 6.3 07/22/2021 08:12 AM   HGBA1C 6.5 07/16/2020 09:51 AM   GFR 66.71 07/22/2021 08:12 AM   GFR 63.15 07/16/2020 09:51 AM   MICROALBUR <0.7 07/22/2021 08:12 AM    Last diabetic Eye exam: No results found for: "HMDIABEYEEXA"  Last diabetic Foot exam: No results found for: "HMDIABFOOTEX"   Lab Results  Component Value Date   CHOL 95 07/22/2021   HDL 31.50 (L) 07/22/2021   LDLCALC 54 07/22/2021   TRIG 48.0 07/22/2021   CHOLHDL 3 07/22/2021  Latest Ref Rng & Units 07/22/2021    8:12 AM 07/16/2020    9:51 AM 11/04/2019    1:45 AM  Hepatic Function  Total Protein 6.0 - 8.3 g/dL 6.8  6.9  6.9   Albumin 3.5 - 5.2 g/dL 4.5  4.6  4.1   AST 0 - 37 U/L 16  19  16    ALT 0 - 53 U/L 17  26  22    Alk Phosphatase 39 - 117 U/L 101  120  91   Total Bilirubin 0.2 - 1.2 mg/dL 0.8  0.9  0.7     Lab Results   Component Value Date/Time   TSH 2.45 07/22/2021 08:12 AM   TSH 1.55 07/16/2020 09:51 AM       Latest Ref Rng & Units 07/22/2021    8:12 AM 12/08/2020   11:50 AM 07/16/2020    9:51 AM  CBC  WBC 4.0 - 10.5 K/uL 5.5  6.0  5.6   Hemoglobin 13.0 - 17.0 g/dL 14.1  14.6  15.0   Hematocrit 39.0 - 52.0 % 41.6  41.1  43.7   Platelets 150.0 - 400.0 K/uL 164.0  178  174.0     Lab Results  Component Value Date/Time   VD25OH 15 (L) 06/05/2013 11:58 AM    Clinical ASCVD: Yes  The ASCVD Risk score (Arnett DK, et al., 2019) failed to calculate for the following reasons:   The patient has a prior MI or stroke diagnosis       10/05/2021    8:36 AM 07/22/2021    8:12 AM 05/10/2021    8:24 AM  Depression screen PHQ 2/9  Decreased Interest  0 0  Down, Depressed, Hopeless 0 0 0  PHQ - 2 Score 0 0 0  Tired, decreased energy 0    Feeling bad or failure about yourself  0    Trouble concentrating 0    Moving slowly or fidgety/restless 0    Suicidal thoughts 0    Difficult doing work/chores Not difficult at all        Social History   Tobacco Use  Smoking Status Former   Types: Cigarettes   Quit date: 03/14/2000   Years since quitting: 21.6  Smokeless Tobacco Never   BP Readings from Last 3 Encounters:  10/13/21 118/80  10/05/21 116/82  09/07/21 132/79   Pulse Readings from Last 3 Encounters:  10/13/21 77  10/05/21 (!) 57  09/07/21 60   Wt Readings from Last 3 Encounters:  10/13/21 187 lb 12.8 oz (85.2 kg)  10/05/21 184 lb 12.8 oz (83.8 kg)  09/07/21 185 lb (83.9 kg)   BMI Readings from Last 3 Encounters:  10/13/21 29.41 kg/m  10/05/21 28.94 kg/m  09/07/21 28.98 kg/m    Assessment/Interventions: Review of patient past medical history, allergies, medications, health status, including review of consultants reports, laboratory and other test data, was performed as part of comprehensive evaluation and provision of chronic care management services.   SDOH:  (Social  Determinants of Health) assessments and interventions performed: Yes  SDOH Interventions    Flowsheet Row Chronic Care Management from 10/25/2021 in [place] at [place] from 05/10/2021 in [place] at [place] Management from 05/19/2020 in [place] at [place] from 04/20/2020 in [place] at [place] from 11/06/2019 in [place] Interventions -- Intervention Not Indicated -- Intervention Not Indicated Intervention Not Indicated  [  pt already has food stamps]  Housing Interventions -- Intervention Not Indicated -- Intervention Not Indicated Other (Comment)  [referral to [employer] for rental assistance]  Transportation Interventions -- Intervention Not Indicated Intervention Not Indicated Intervention Not Indicated [employer], Other (Comment)  [emailed pt with transportation resources]  Financial Strain Interventions Intervention Not Indicated Intervention Not Indicated Other (Comment)  [working on switching to lower cost effective alternatives] Intervention Not Indicated Other (Comment)  [HOPE Program]  Physical Activity Interventions -- Intervention Not Indicated -- Intervention Not Indicated --  Stress Interventions -- Intervention Not Indicated -- Intervention Not Indicated --  Social Connections Interventions -- Intervention Not Indicated -- -- --      SDOH Screenings   Food Insecurity: No Food Insecurity (05/10/2021)  Housing: Low Risk  (05/10/2021)  Transportation Needs: No Transportation Needs (05/10/2021)  Alcohol Screen: Low Risk  (05/10/2021)  Depression (PHQ2-9): Low Risk  (10/05/2021)  Financial Resource Strain: Low Risk  (10/25/2021)  Physical Activity: Insufficiently Active (05/10/2021)  Social Connections: Moderately Integrated (05/10/2021)  Stress: No Stress Concern Present (05/10/2021)  Tobacco Use: Medium Risk  (10/13/2021)      CCM Care Plan  Allergies  Allergen Reactions   Amoxicillin Hives         Medications Reviewed Today     Reviewed by Viona Gilmore, [facility] (Pharmacist) on 10/25/21 at 1113  Med List Status: <None>   Medication Order Taking? Sig Documenting Provider Last Dose Status Informant  alfuzosin (UROXATRAL) 10 MG 24 hr tablet [id number]  Take 10 mg by mouth daily. [provider]  Active   amLODipine (NORVASC) 10 MG tablet [id number]  TAKE ONE TABLET BY MOUTH ONCE DAILY Nafziger, Tommi Rumps, NP  Active   aspirin 81 MG EC tablet [id number]  Take 1 tablet (81 mg total) by mouth daily. Jessee Avers, MD  Active Self  atorvastatin (LIPITOR) 80 MG tablet [id number]  Take 1 tablet (80 mg total) by mouth daily. Belva Crome, MD  Active   clopidogrel (PLAVIX) 75 MG tablet [id number]  TAKE ONE TABLET BY MOUTH ONCE DAILY Belva Crome, MD  Active   cyclobenzaprine (FLEXERIL) 10 MG tablet [id number]  Take 1 tablet (10 mg total) by mouth 3 (three) times daily as needed for muscle spasms. Nafziger, Tommi Rumps, NP  Active   metFORMIN (GLUCOPHAGE-XR) 500 MG 24 hr tablet [id number]  TAKE ONE TABLET BY MOUTH EVERY MORNING with breakfast Nafziger, Tommi Rumps, NP  Active   metoprolol succinate (TOPROL-XL) 25 MG 24 hr tablet [id number]  TAKE ONE TABLET BY MOUTH ONCE DAILY Belva Crome, MD  Active   nitroGLYCERIN (NITROSTAT) 0.4 MG SL tablet [id number]  DISSOLVE 1 TABLET UNDER THE TONGUE EVERY 5 MINUTES AS NEEDED FOR CHEST PAIN. DO NOT EXCEED A TOTAL OF 3 DOSES IN 15 MINUTES. Belva Crome, MD  Active   pantoprazole (PROTONIX) 40 MG tablet [id number] Yes Take 1 tablet (40 mg total) by mouth 2 (two) times daily. Loralie Champagne, PA-C Taking Active     Discontinued 10/25/21 1113 (Change in therapy)   sodium chloride flush (NS) 0.9 % injection 3 mL [id number]   Josue Hector, MD  Active             Patient Active Problem List   Diagnosis Date Noted   Oropharyngeal dysphagia 10/13/2021   Angina  pectoris ([place])    CAD S/P percutaneous coronary angioplasty 11/06/2019   Dyspnea 11/06/2019   NSTEMI (non-ST elevated myocardial infarction) ([place]) 10/21/2019   Acquired trigger finger  of left middle finger 08/13/2019   Pain in finger of left hand 08/13/2019   Erectile dysfunction 08/09/2016   Low testosterone in male 08/09/2016   BPH (benign prostatic hyperplasia) 01/23/2015   History of glaucoma 10/10/2014   Vitamin D deficiency 04/18/2014   Xerostomia 01/27/2014   GERD (gastroesophageal reflux disease) 06/05/2013   Prediabetes 06/05/2013   High risk homosexual behavior 06/05/2013   Essential hypertension 12/07/2009   COLONIC POLYPS, ADENOMATOUS 01/05/2006   Hyperlipidemia 01/05/2006    Immunization History  Administered Date(s) Administered   Influenza Split 11/21/2011   Influenza Whole 11/23/2009   Influenza,inj,Quad PF,6+ Mos 12/06/2018, 11/03/2020   Influenza,inj,Quad PF,6-35 Mos 01/29/2016   Moderna SARS-COV2 Booster Vaccination 12/20/2019   PFIZER(Purple Top)SARS-COV-2 Vaccination 02/07/2019, 02/28/2019   Pneumococcal Conjugate-13 07/16/2019   Pneumococcal Polysaccharide-23 07/16/2020   Zoster Recombinat (Shingrix) 07/16/2019, 10/08/2019    Patient reports he is having dry mouth and to the point of not being able to swallow. He first noticed it with the heart attack. He thought it was coming from the pantoprazole and he stopped that on his own and didn't notice a change in his symptoms. He reports he is more relaxed and when he is most bothered by being up during the mouth. He needs to drink more water. Patient has backed down on the coffee because he thought it might be contributing to dry mouth.   Patient reports his brother reports he snores and it does happen often. He thinks he had had a sleep study done but never got the results. He is still having daytime fatigue. He feels relaxed at night but not always sleepy. He did try the melatonin before and it did help but he  was worried about taking it consistently. He does nap some but not for very long or very often.  Patient also inquired about the metformin causing weight loss. He started on it a year ago and has lost about 5 lbs so far and doesn't want to lose much more.   Conditions to be addressed/monitored:  Hypertension, Hyperlipidemia, Coronary Artery Disease, GERD, BPH and Prediabetes  Conditions addressed this visit: Hypertension, BPH, CAD  Care Plan : CCM Pharmacy Care Plan  Updates made by Viona Gilmore, [place] since 10/25/2021 12:00 AM     Problem: Problem: Hypertension, Hyperlipidemia, Coronary Artery Disease, GERD, BPH and Prediabetes      Long-Range Goal: Patient-Specific Goal   Start Date: 05/19/2020  Expected End Date: 05/19/2021  Recent Progress: On track  Priority: High  Note:   Current Barriers:  Unable to independently afford treatment regimen Unable to independently monitor therapeutic efficacy  Pharmacist Clinical Goal(s):  Patient will verbalize ability to afford treatment regimen achieve adherence to monitoring guidelines and medication adherence to achieve therapeutic efficacy through collaboration with PharmD and provider.   Interventions: 1:1 collaboration with [name], NP regarding development and update of comprehensive plan of care as evidenced by provider attestation and co-signature Inter-disciplinary care team collaboration (see longitudinal plan of care) Comprehensive medication review performed; medication list updated in electronic medical record  Hypertension (BP goal <130/80) -Controlled -Current treatment: Amlodipine 10 mg 1 tablet daily - Appropriate, Effective, Safe, Accessible Metoprolol tartrate 25 mg 1 tablet daily - Appropriate, Effective, Safe, Accessible -Medications previously tried: none  -Current home readings: does not check at home -Current dietary habits: trying to limit salt intake -Current exercise habits: was going to the gym  regularly -Reports hypotensive/hypertensive symptoms -Educated on Exercise goal of 150 minutes per week; Importance of  home blood pressure monitoring; Proper BP monitoring technique; Symptoms of hypotension and importance of maintaining adequate hydration; -Counseled to monitor BP at home weekly, document, and provide log at future appointments -Counseled on diet and exercise extensively Recommended to continue current medication  Hyperlipidemia: (LDL goal < 55) -Controlled -Current treatment: Atorvastatin 80 mg 1 tablet daily - Appropriate, Effective, Safe, Accessible -Medications previously tried: none  -Current dietary patterns: fried foods every once in a while -Current exercise habits: was going to the gym regularly -Educated on Cholesterol goals;  Benefits of statin for ASCVD risk reduction; Importance of limiting foods high in cholesterol; Exercise goal of 150 minutes per week; -Counseled on diet and exercise extensively Recommended to continue current medication  CAD/NSTEMI (Goal: prevent heart attacks and strokes) -Controlled -Current treatment  Atorvastatin 80 mg 1 tablet daily - Appropriate, Effective, Safe, Accessible Aspirin 81 mg 1 tablet daily - Appropriate, Effective, Safe, Accessible Clopidogrel 75 mg 1 tablet daily - Appropriate, Effective, Safe, Accessible Nitroglycerin 0.4 mg SL 1 tablet as needed - Appropriate, Effective, Safe, Accessible -Medications previously tried: none  -Counseled on monitoring for signs of bleeding such as unexplained and excessive bleeding from a cut or injury, easy or excessive bruising, blood in urine or stools, and nosebleeds without a known cause Recommended checking expiration date on nitroglycerin and refilling   Diabetes (A1c goal <7%) -Controlled -Current medications: Metformin XR 500 mg 1 tablet daily - Appropriate, Effective, Safe, Accessible -Medications previously tried: none  -Current home glucose readings fasting  glucose: does not need to check post prandial glucose: does not need to check -Denies hypoglycemic/hyperglycemic symptoms -Current meal patterns:  breakfast: did not discuss  lunch: did not discuss   dinner: did not discuss  snacks: did not discuss  drinks: did not discuss  -Current exercise: was going to the gym regularly -Educated on A1c and blood sugar goals; Carbohydrate counting and/or plate method -Counseled to check feet daily and get yearly eye exams -Counseled on diet and exercise extensively  GERD (Goal: minimize symptoms of acid reflux/GERD) -Not ideally controlled -Current treatment  Pantoprazole 40 mg 1 tablet twice daily - Appropriate, Query effective, Safe, Accessible -Medications previously tried: none  -Counseled on non-pharmacologic management of symptoms such as elevating the head of your bed, avoiding eating 2-3 hours before bed, avoiding triggering foods such as acidic, spicy, or fatty foods, eating smaller meals, and wearing clothes that are loose around the waist  BPH (Goal: minimize symptoms) -Controlled -Current treatment  Alfuzosin 10 mg 1 tablet daily - Appropriate, Effective, Safe, Accessible -Medications previously tried: tamsulosin (unknown) -Recommended discussing with urology about switching medications to one with less of a risk of dry mouth.   Health Maintenance -Vaccine gaps: COVID booster, influenza -Current therapy:  Acetaminophen 325 mg 1 tablet as needed -Educated on Cost vs benefit of each product must be carefully weighed by individual consumer -Patient is satisfied with current therapy and denies issues -Recommended to continue current medication  Patient Goals/Self-Care Activities Patient will:  - focus on medication adherence by switching to adherence packaging check blood pressure weekly, document, and provide at future appointments target a minimum of 150 minutes of moderate intensity exercise weekly  Follow Up Plan: The care  management team will reach out to the patient again over the next 14 days.       Medication Assistance: None required.  Patient affirms current coverage meets needs.  Compliance/Adherence/Medication fill history: Care Gaps: COVID booster, influenza, colonoscopy (last 2018) BP- 118/80 (10/13/21) A1c - 6.3% (  07/22/21)   Star-Rating Drugs: Atorvastatin 80 mg - Last filled 10/15/21 30 DS at [facility]  Patient's preferred pharmacy is:  [facility] - [place], [place] - [address] [place] [address] Phone: [phone] Fax: [phone]  Uses pill box? Yes - not using consistently (was lazy) Pt endorses 99% compliance  We discussed: Benefits of medication synchronization, packaging and delivery as well as enhanced pharmacist oversight with [facility]. Patient decided to: Utilize [facility] pharmacy for medication synchronization, packaging and delivery  Care Plan and Follow Up Patient Decision:  Patient agrees to Care Plan and Follow-up.  Plan: The care management team will reach out to the patient again over the next 14 days.  Jeni Salles, PharmD [facility] Clinical Pharmacist [place] at [place]

## 2021-10-25 ENCOUNTER — Ambulatory Visit (INDEPENDENT_AMBULATORY_CARE_PROVIDER_SITE_OTHER): Payer: Medicare HMO | Admitting: Pharmacist

## 2021-10-25 DIAGNOSIS — N400 Enlarged prostate without lower urinary tract symptoms: Secondary | ICD-10-CM

## 2021-10-25 DIAGNOSIS — I10 Essential (primary) hypertension: Secondary | ICD-10-CM

## 2021-10-25 NOTE — Patient Instructions (Signed)
Hi Dylan Lane,  It was great to get to catch up again! Don't forget to discuss the dry mouth with your urologist to see if they can change the alfuzosin.  Please reach out to me if you have any questions or need anything!  Best, Dylan Lane  Dylan Lane, PharmD, [facility]   Visit Information   Goals Addressed   None    Patient Care Plan: CCM Pharmacy Care Plan     Problem Identified: Problem: Hypertension, Hyperlipidemia, Coronary Artery Disease, GERD, BPH and Prediabetes      Long-Range Goal: Patient-Specific Goal   Start Date: 05/19/2020  Expected End Date: 05/19/2021  Recent Progress: On track  Priority: High  Note:   Current Barriers:  Unable to independently afford treatment regimen Unable to independently monitor therapeutic efficacy  Pharmacist Clinical Goal(s):  Patient will verbalize ability to afford treatment regimen achieve adherence to monitoring guidelines and medication adherence to achieve therapeutic efficacy through collaboration with PharmD and provider.   Interventions: 1:1 collaboration with Dylan Peng, NP regarding development and update of comprehensive plan of care as evidenced by provider attestation and co-signature Inter-disciplinary care team collaboration (see longitudinal plan of care) Comprehensive medication review performed; medication list updated in electronic medical record  Hypertension (BP goal <130/80) -Controlled -Current treatment: Amlodipine 10 mg 1 tablet daily - Appropriate, Effective, Safe, Accessible Metoprolol tartrate 25 mg 1 tablet daily - Appropriate, Effective, Safe, Accessible -Medications previously tried: none  -Current home readings: does not check at home -Current dietary habits: trying to limit salt intake -Current exercise habits: was going to the gym regularly -Reports hypotensive/hypertensive symptoms -Educated on Exercise goal of 150 minutes per  week; Importance of home blood pressure monitoring; Proper BP monitoring technique; Symptoms of hypotension and importance of maintaining adequate hydration; -Counseled to monitor BP at home weekly, document, and provide log at future appointments -Counseled on diet and exercise extensively Recommended to continue current medication  Hyperlipidemia: (LDL goal < 55) -Controlled -Current treatment: Atorvastatin 80 mg 1 tablet daily - Appropriate, Effective, Safe, Accessible -Medications previously tried: none  -Current dietary patterns: fried foods every once in a while -Current exercise habits: was going to the gym regularly -Educated on Cholesterol goals;  Benefits of statin for ASCVD risk reduction; Importance of limiting foods high in cholesterol; Exercise goal of 150 minutes per week; -Counseled on diet and exercise extensively Recommended to continue current medication  CAD/NSTEMI (Goal: prevent heart attacks and strokes) -Controlled -Current treatment  Atorvastatin 80 mg 1 tablet daily - Appropriate, Effective, Safe, Accessible Aspirin 81 mg 1 tablet daily - Appropriate, Effective, Safe, Accessible Clopidogrel 75 mg 1 tablet daily - Appropriate, Effective, Safe, Accessible Nitroglycerin 0.4 mg SL 1 tablet as needed - Appropriate, Effective, Safe, Accessible -Medications previously tried: none  -Counseled on monitoring for signs of bleeding such as unexplained and excessive bleeding from a cut or injury, easy or excessive bruising, blood in urine or stools, and nosebleeds without a known cause Recommended checking expiration date on nitroglycerin and refilling   Diabetes (A1c goal <7%) -Controlled -Current medications: Metformin XR 500 mg 1 tablet daily - Appropriate, Effective, Safe, Accessible -Medications previously tried: none  -Current home glucose readings fasting glucose: does not need to check post prandial glucose: does not need to check -Denies  hypoglycemic/hyperglycemic symptoms -Current meal patterns:  breakfast: did not discuss  lunch: did not discuss   dinner: did not discuss  snacks: did not discuss  drinks: did not discuss  -  Current exercise: was going to the gym regularly -Educated on A1c and blood sugar goals; Carbohydrate counting and/or plate method -Counseled to check feet daily and get yearly eye exams -Counseled on diet and exercise extensively  GERD (Goal: minimize symptoms of acid reflux/GERD) -Not ideally controlled -Current treatment  Pantoprazole 40 mg 1 tablet twice daily - Appropriate, Query effective, Safe, Accessible -Medications previously tried: none  -Counseled on non-pharmacologic management of symptoms such as elevating the head of your bed, avoiding eating 2-3 hours before bed, avoiding triggering foods such as acidic, spicy, or fatty foods, eating smaller meals, and wearing clothes that are loose around the waist  BPH (Goal: minimize symptoms) -Controlled -Current treatment  Alfuzosin 10 mg 1 tablet daily - Appropriate, Effective, Safe, Accessible -Medications previously tried: tamsulosin (unknown) -Recommended discussing with urology about switching medications to one with less of a risk of dry mouth.   Health Maintenance -Vaccine gaps: COVID booster, influenza -Current therapy:  Acetaminophen 325 mg 1 tablet as needed -Educated on Cost vs benefit of each product must be carefully weighed by individual consumer -Patient is satisfied with current therapy and denies issues -Recommended to continue current medication  Patient Goals/Self-Care Activities Patient will:  - focus on medication adherence by switching to adherence packaging check blood pressure weekly, document, and provide at future appointments target a minimum of 150 minutes of moderate intensity exercise weekly  Follow Up Plan: The care management team will reach out to the patient again over the next 14 days.         Patient verbalizes understanding of instructions and care plan provided today and agrees to view in [place]. Active MyChart status and patient understanding of how to access instructions and care plan via MyChart confirmed with patient.    The pharmacy team will reach out to the patient again over the next 7 days.   Viona Gilmore, [facility]

## 2021-10-27 ENCOUNTER — Telehealth: Payer: Self-pay | Admitting: Interventional Cardiology

## 2021-10-27 DIAGNOSIS — N3941 Urge incontinence: Secondary | ICD-10-CM | POA: Diagnosis not present

## 2021-10-27 DIAGNOSIS — N401 Enlarged prostate with lower urinary tract symptoms: Secondary | ICD-10-CM | POA: Diagnosis not present

## 2021-10-27 NOTE — Telephone Encounter (Signed)
Pt c/o medication issue:  1. Name of Medication:    2. How are you currently taking this medication (dosage and times per day)?    3. Are you having a reaction (difficulty breathing--STAT)? no  4. What is your medication issue? Patient calling in because he thinks that his heart medication, is causing him to have a dry mouth and making it hard to swallow. Please advise

## 2021-10-27 NOTE — Telephone Encounter (Signed)
Called patient back about PharmD's recommendations. Patient will follow up with ENT doctor he is seeing soon and will discuss changing medication in November when he sees Dr. Tamala Julian if he feels like nothing else is the cause of his dry mouth.

## 2021-10-27 NOTE — Telephone Encounter (Signed)
Patient complaining  of Amlodipine making him have a dry month. Will send message to pharm D to see if this medication or another medication on his list would make him have a dry mouth.

## 2021-10-27 NOTE — Telephone Encounter (Signed)
Dry mouth is reported in less than 1% of patients taking amlodipine. Recommend patient follow up with PCP

## 2021-10-28 ENCOUNTER — Ambulatory Visit: Payer: Medicare HMO | Admitting: Family

## 2021-10-28 ENCOUNTER — Telehealth: Payer: Self-pay

## 2021-10-28 NOTE — Telephone Encounter (Signed)
caller states his BP this morning is 141/91. BP taken after his took his BP medication this morning  10/28/2021 8:46:23 AM See PCP within 24 Hours Roselie Awkward, RN, Nira Conn  Comments User: Mauri Pole, RN Date/Time Eilene Ghazi Time): 10/28/2021 8:53:49 AM office made aware of outcome. Caller warm transferred to office for further assistance.  Referrals REFERRED TO PCP OFFICE  Pt has appt today with Jeanie Sewer, NP at 4:20

## 2021-10-29 ENCOUNTER — Ambulatory Visit: Payer: Medicare HMO | Admitting: Family Medicine

## 2021-11-01 ENCOUNTER — Ambulatory Visit: Payer: Medicare HMO

## 2021-11-02 DIAGNOSIS — N400 Enlarged prostate without lower urinary tract symptoms: Secondary | ICD-10-CM

## 2021-11-02 DIAGNOSIS — E785 Hyperlipidemia, unspecified: Secondary | ICD-10-CM | POA: Diagnosis not present

## 2021-11-02 DIAGNOSIS — E1159 Type 2 diabetes mellitus with other circulatory complications: Secondary | ICD-10-CM

## 2021-11-02 DIAGNOSIS — I2510 Atherosclerotic heart disease of native coronary artery without angina pectoris: Secondary | ICD-10-CM | POA: Diagnosis not present

## 2021-11-02 DIAGNOSIS — I10 Essential (primary) hypertension: Secondary | ICD-10-CM | POA: Diagnosis not present

## 2021-11-02 DIAGNOSIS — Z7984 Long term (current) use of oral hypoglycemic drugs: Secondary | ICD-10-CM

## 2021-11-04 ENCOUNTER — Other Ambulatory Visit: Payer: Self-pay | Admitting: Adult Health

## 2021-11-04 DIAGNOSIS — I10 Essential (primary) hypertension: Secondary | ICD-10-CM

## 2021-11-08 ENCOUNTER — Telehealth: Payer: Self-pay | Admitting: Pharmacist

## 2021-11-08 NOTE — Chronic Care Management (AMB) (Signed)
Chronic Care Management Pharmacy Assistant   Name: Dylan Lane  MRN: [id number] DOB: Sep 29, 1950  Reason for Encounter: Medication Review / Medication Coordination Call   Recent office visits:  None  Recent consult visits:  None  Hospital visits:  None  Medications: Outpatient Encounter Medications as of 11/08/2021  Medication Sig   alfuzosin (UROXATRAL) 10 MG 24 hr tablet Take 10 mg by mouth daily.   amLODipine (NORVASC) 10 MG tablet TAKE ONE TABLET BY MOUTH ONCE DAILY   aspirin 81 MG EC tablet Take 1 tablet (81 mg total) by mouth daily.   atorvastatin (LIPITOR) 80 MG tablet Take 1 tablet (80 mg total) by mouth daily.   clopidogrel (PLAVIX) 75 MG tablet TAKE ONE TABLET BY MOUTH ONCE DAILY   cyclobenzaprine (FLEXERIL) 10 MG tablet Take 1 tablet (10 mg total) by mouth 3 (three) times daily as needed for muscle spasms.   metFORMIN (GLUCOPHAGE-XR) 500 MG 24 hr tablet TAKE ONE TABLET BY MOUTH EVERY MORNING with breakfast   metoprolol succinate (TOPROL-XL) 25 MG 24 hr tablet TAKE ONE TABLET BY MOUTH ONCE DAILY   nitroGLYCERIN (NITROSTAT) 0.4 MG SL tablet DISSOLVE 1 TABLET UNDER THE TONGUE EVERY 5 MINUTES AS NEEDED FOR CHEST PAIN. DO NOT EXCEED A TOTAL OF 3 DOSES IN 15 MINUTES.   pantoprazole (PROTONIX) 40 MG tablet Take 1 tablet (40 mg total) by mouth 2 (two) times daily.   Facility-Administered Encounter Medications as of 11/08/2021  Medication   sodium chloride flush (NS) 0.9 % injection 3 mL   Reviewed chart for medication changes ahead of medication coordination call.  No OVs, Consults, or hospital visits since last care coordination call/Pharmacist visit. (If appropriate, list visit date, provider name)  No medication changes indicated OR if recent visit, treatment plan here.  BP Readings from Last 3 Encounters:  10/13/21 118/80  10/05/21 116/82  09/07/21 132/79    Lab Results  Component Value Date   HGBA1C 6.3 07/22/2021     Patient obtains medications  through Vials  30 Days    Last adherence delivery included:  Alfuzosin ER '10mg'$  - 1 tablet once daily Metformin ER 500 mg - 1 tablet with breakfast Metoprolol succinate '25mg'$  - 1 tablet once daily Clopidogrel 75 mg - 1 tablet once daily  Amlodipine '10mg'$  - 1 tablet once daily Atorvastatin '80mg'$  - 1 tablet once daily Nitroglycerin 0.4 mg - take 1 tablet under the tongue every 5 minutes as needed for chest pain   Patient declined (meds) last month:  Flonase NS 50 mcg/act - 2 sprays in each nostril daily Pantoprazole '40mg'$  - 1 tablet with breakfast (patient reminded he needs to make appt. With Dr Hilarie Fredrickson before they will send a refill. Pt voices clear understanding)   Patient is due for next adherence delivery on: 11/19/2021     Called patient and reviewed medications and coordinated delivery.   This delivery to include: Alfuzosin ER '10mg'$  - 1 tablet once daily Metformin ER 500 mg - 1 tablet with breakfast Metoprolol succinate '25mg'$  - 1 tablet once daily Pantoprazole '40mg'$  - 1 tablet twice daily Clopidogrel 75 mg - 1 tablet once daily  Amlodipine '10mg'$  - 1 tablet once daily Atorvastatin '80mg'$  - 1 tablet once daily   Patient will need a short fill: No short fills needed   Coordinated acute fill: No acute fills needed   Patient declined the following medications: Patient has plenty on hand Flonase NS 50 mcg/act - 2 sprays in each nostril daily Nitroglycerin  0.4 mg -    Confirmed delivery date of 11/19/2021, advised patient that pharmacy will contact them the morning of delivery.   Care Gaps: AWV - scheduled 05/12/2022 Last BP - 118/80 on 10/13/2021 Last A1C - 6.3 on 07/22/2021 Covid - overdue Flu - due Colonoscopy - postponed  Star Rating Drugs: Atorvastatin '80mg'$  - last filled 10/15/2021 30 DS at [facility] Metformin '500mg'$  - last filled 10/15/2021 30 DS at [facility] [phone]

## 2021-11-10 DIAGNOSIS — R1310 Dysphagia, unspecified: Secondary | ICD-10-CM | POA: Diagnosis not present

## 2021-11-10 DIAGNOSIS — K117 Disturbances of salivary secretion: Secondary | ICD-10-CM | POA: Diagnosis not present
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
## 2021-11-23 ENCOUNTER — Encounter: Payer: Self-pay | Admitting: Interventional Cardiology

## 2021-11-23 ENCOUNTER — Ambulatory Visit: Payer: Medicare HMO | Attending: Interventional Cardiology | Admitting: Interventional Cardiology

## 2021-11-23 VITALS — BP 128/80 | HR 55 | Ht 67.0 in | Wt 186.6 lb

## 2021-11-23 DIAGNOSIS — E782 Mixed hyperlipidemia: Secondary | ICD-10-CM | POA: Diagnosis not present

## 2021-11-23 DIAGNOSIS — I209 Angina pectoris, unspecified: Secondary | ICD-10-CM | POA: Diagnosis not present

## 2021-11-23 DIAGNOSIS — I10 Essential (primary) hypertension: Secondary | ICD-10-CM

## 2021-11-23 DIAGNOSIS — I2510 Atherosclerotic heart disease of native coronary artery without angina pectoris: Secondary | ICD-10-CM

## 2021-11-23 DIAGNOSIS — R7303 Prediabetes: Secondary | ICD-10-CM | POA: Diagnosis not present

## 2021-11-23 NOTE — Patient Instructions (Signed)
Medication Instructions:  Your physician recommends that you continue on your current medications as directed. Please refer to the Current Medication list given to you today.  *If you need a refill on your cardiac medications before your next appointment, please call your pharmacy*  Follow-Up: At [facility], you and your health needs are our priority.  As part of our continuing mission to provide you with exceptional heart care, we have created designated Provider Care Teams.  These Care Teams include your primary Cardiologist (physician) and Advanced Practice Providers (APPs -  Physician Assistants and Nurse Practitioners) who all work together to provide you with the care you need, when you need it.  Your next appointment:   1 year(s)  The format for your next appointment:   In Person  Provider:   Rudean Haskell, MD  Other Instructions Your physician encourages you to increase your activity to a goal of getting in 150 minutes of moderate-intensity activity per week (walking is great exercise).  Important Information About Sugar

## 2021-11-29 ENCOUNTER — Ambulatory Visit: Payer: Medicare HMO | Attending: Physician Assistant | Admitting: Speech Pathology

## 2021-11-29 ENCOUNTER — Other Ambulatory Visit: Payer: Self-pay

## 2021-11-29 DIAGNOSIS — R1312 Dysphagia, oropharyngeal phase: Secondary | ICD-10-CM | POA: Diagnosis not present
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
## 2021-12-02 ENCOUNTER — Other Ambulatory Visit (HOSPITAL_COMMUNITY): Payer: Self-pay | Admitting: *Deleted

## 2021-12-02 DIAGNOSIS — R1310 Dysphagia, unspecified: Secondary | ICD-10-CM

## 2021-12-07 ENCOUNTER — Telehealth: Payer: Self-pay | Admitting: Pharmacist

## 2021-12-07 NOTE — Chronic Care Management (AMB) (Cosign Needed)
Chronic Care Management Pharmacy Assistant   Name: Dylan Lane  MRN: [id number] DOB: 04/05/1950  Reason for Encounter: Medication Review / Medication Coordination Call   Recent office visits:  None  Recent consult visits:  11/23/2021 Daneen Schick MD (cardiology) - Patient was seen for angina pectoris and additional concerns. Discontinued Atorvastatin. Follow up in 1 year.   11/10/2021 Pietro Cassis PA-C (ENT) - Patient was seen for dysphagia, unspecified. Referral to speech therapy. No medication changes. No follow up noted.   Hospital visits:  None  Medications: Outpatient Encounter Medications as of 12/07/2021  Medication Sig   alfuzosin (UROXATRAL) 10 MG 24 hr tablet Take 10 mg by mouth daily.   amLODipine (NORVASC) 10 MG tablet TAKE ONE TABLET BY MOUTH ONCE DAILY   aspirin 81 MG EC tablet Take 1 tablet (81 mg total) by mouth daily.   clopidogrel (PLAVIX) 75 MG tablet TAKE ONE TABLET BY MOUTH ONCE DAILY   cyclobenzaprine (FLEXERIL) 10 MG tablet Take 1 tablet (10 mg total) by mouth 3 (three) times daily as needed for muscle spasms.   metFORMIN (GLUCOPHAGE-XR) 500 MG 24 hr tablet TAKE ONE TABLET BY MOUTH EVERY MORNING with breakfast   metoprolol succinate (TOPROL-XL) 25 MG 24 hr tablet TAKE ONE TABLET BY MOUTH ONCE DAILY   nitroGLYCERIN (NITROSTAT) 0.4 MG SL tablet DISSOLVE 1 TABLET UNDER THE TONGUE EVERY 5 MINUTES AS NEEDED FOR CHEST PAIN. DO NOT EXCEED A TOTAL OF 3 DOSES IN 15 MINUTES.   pantoprazole (PROTONIX) 40 MG tablet Take 1 tablet (40 mg total) by mouth 2 (two) times daily. (Patient not taking: Reported on 11/29/2021)   pravastatin (PRAVACHOL) 40 MG tablet Take 1 tablet by mouth daily.   Facility-Administered Encounter Medications as of 12/07/2021  Medication   sodium chloride flush (NS) 0.9 % injection 3 mL   Reviewed chart for medication changes ahead of medication coordination call.  BP Readings from Last 3 Encounters:  11/23/21 128/80  10/13/21 118/80   10/05/21 116/82    Lab Results  Component Value Date   HGBA1C 6.3 07/22/2021     Patient obtains medications through Vials  30 Days    Last adherence delivery included:  Alfuzosin ER '10mg'$  - 1 tablet once daily Metformin ER 500 mg - 1 tablet with breakfast Metoprolol succinate '25mg'$  - 1 tablet once daily Pantoprazole '40mg'$  - 1 tablet twice daily Clopidogrel 75 mg - 1 tablet once daily  Amlodipine '10mg'$  - 1 tablet once daily Atorvastatin '80mg'$  - 1 tablet once daily   Patient declined (meds) last month:  Flonase NS 50 mcg/act - 2 sprays in each nostril daily Nitroglycerin 0.4 mg -    Patient is due for next adherence delivery on: 12/20/2021     Called patient and reviewed medications and coordinated delivery.   This delivery to include: Alfuzosin ER '10mg'$  - 1 tablet once daily Metformin ER 500 mg - 1 tablet with breakfast Metoprolol succinate '25mg'$  - 1 tablet once daily Pantoprazole '40mg'$  - 1 tablet twice daily Clopidogrel 75 mg - 1 tablet once daily  Amlodipine '10mg'$  - 1 tablet once daily Atorvastatin '80mg'$  - 1 tablet once daily   Patient will need a short fill: No short fills needed   Coordinated acute fill: No acute fills needed   Patient declined the following medications: Patient has plenty on hand Flonase NS 50 mcg/act - 2 sprays in each nostril daily Nitroglycerin 0.4 mg - at needed  Notes:  Dr. Daneen Schick has in patients note  that Atorvastatin 80 mg was discontinued as patient wasn't taking.  His chart now shows Pravastatin. Patient states that he is and has been taking Atorvastatin at 80 mg daily for a long time now and has not been taking Pravastatin.  Patient states his cholesterol is doing very well and doesn't want to mess that up and states Dr. Tamala Julian didn't change any of his medications.    Confirmed delivery date of 12/20/2021, advised patient that pharmacy will contact them the morning of delivery.   Care Gaps: AWV - scheduled 05/12/2022 Last BP - 128/80 on  11/23/2021 Last A1C - 6.3 on 07/22/2021 Tdap - never done Flu - due Covid - overdue Colonoscopy - postponed  Star Rating Drugs: Metformin '500mg'$  - last filled 11/12/2021 30 DS at [facility] [phone]

## 2021-12-08 NOTE — Addendum Note (Signed)
Addended by: Viona Gilmore on: 12/08/2021 08:45 AM   Modules accepted: Orders

## 2021-12-14 ENCOUNTER — Other Ambulatory Visit: Payer: Self-pay | Admitting: Adult Health

## 2021-12-14 ENCOUNTER — Other Ambulatory Visit: Payer: Self-pay | Admitting: Interventional Cardiology

## 2021-12-20 ENCOUNTER — Encounter (HOSPITAL_COMMUNITY): Payer: Medicare HMO

## 2021-12-20 ENCOUNTER — Telehealth (HOSPITAL_COMMUNITY): Payer: Self-pay

## 2021-12-20 ENCOUNTER — Ambulatory Visit (HOSPITAL_COMMUNITY): Payer: Medicare HMO

## 2021-12-20 NOTE — Telephone Encounter (Signed)
Patient called to cancel today's OP Modified Barium Swallow - patient is sick with the flu. Patient did not wish to reschedule today. We will follow up within a week.

## 2021-12-22 ENCOUNTER — Encounter: Payer: Self-pay | Admitting: Adult Health

## 2021-12-22 ENCOUNTER — Ambulatory Visit: Payer: Medicare HMO | Admitting: Adult Health

## 2021-12-22 ENCOUNTER — Ambulatory Visit (INDEPENDENT_AMBULATORY_CARE_PROVIDER_SITE_OTHER): Payer: Medicare HMO

## 2021-12-22 ENCOUNTER — Ambulatory Visit (INDEPENDENT_AMBULATORY_CARE_PROVIDER_SITE_OTHER): Payer: Medicare HMO | Admitting: Adult Health

## 2021-12-22 VITALS — BP 110/60 | HR 90 | Temp 98.2°F | Ht 67.0 in | Wt 183.0 lb

## 2021-12-22 DIAGNOSIS — I10 Essential (primary) hypertension: Secondary | ICD-10-CM

## 2021-12-22 DIAGNOSIS — J988 Other specified respiratory disorders: Secondary | ICD-10-CM | POA: Diagnosis not present

## 2021-12-22 DIAGNOSIS — R059 Cough, unspecified: Secondary | ICD-10-CM | POA: Diagnosis not present

## 2021-12-22 DIAGNOSIS — R61 Generalized hyperhidrosis: Secondary | ICD-10-CM | POA: Diagnosis not present

## 2021-12-22 DIAGNOSIS — R7303 Prediabetes: Secondary | ICD-10-CM | POA: Diagnosis not present

## 2021-12-22 LAB — CBC WITH DIFFERENTIAL/PLATELET
Basophils Absolute: 0 10*3/uL (ref 0.0–0.1)
Basophils Relative: 0.4 % (ref 0.0–3.0)
Eosinophils Absolute: 0.1 10*3/uL (ref 0.0–0.7)
Eosinophils Relative: 1.7 % (ref 0.0–5.0)
HCT: 41.3 % (ref 39.0–52.0)
Hemoglobin: 14.2 g/dL (ref 13.0–17.0)
Lymphocytes Relative: 25.2 % (ref 12.0–46.0)
Lymphs Abs: 2.1 10*3/uL (ref 0.7–4.0)
MCHC: 34.4 g/dL (ref 30.0–36.0)
MCV: 84.2 fl (ref 78.0–100.0)
Monocytes Absolute: 0.7 10*3/uL (ref 0.1–1.0)
Monocytes Relative: 8.7 % (ref 3.0–12.0)
Neutro Abs: 5.3 10*3/uL (ref 1.4–7.7)
Neutrophils Relative %: 64 % (ref 43.0–77.0)
Platelets: 252 10*3/uL (ref 150.0–400.0)
RBC: 4.91 Mil/uL (ref 4.22–5.81)
RDW: 13.7 % (ref 11.5–15.5)
WBC: 8.3 10*3/uL (ref 4.0–10.5)

## 2021-12-22 LAB — POCT INFLUENZA A/B
Influenza A, POC: NEGATIVE
Influenza B, POC: NEGATIVE

## 2021-12-22 LAB — BASIC METABOLIC PANEL
BUN: 14 mg/dL (ref 6–23)
CO2: 30 mEq/L (ref 19–32)
Calcium: 10.4 mg/dL (ref 8.4–10.5)
Chloride: 104 mEq/L (ref 96–112)
Creatinine, Ser: 1.22 mg/dL (ref 0.40–1.50)
GFR: 60.67 mL/min (ref >60.00)
Glucose, Bld: 110 mg/dL — ABNORMAL HIGH (ref 70–99)
Potassium: 3.7 mEq/L (ref 3.5–5.1)
Sodium: 142 mEq/L (ref 135–145)

## 2021-12-22 LAB — POC COVID19 BINAXNOW: SARS Coronavirus 2 Ag: NEGATIVE

## 2021-12-22 LAB — POCT GLYCOSYLATED HEMOGLOBIN (HGB A1C): Hemoglobin A1C: 6 % — AB (ref 4.0–5.6)

## 2021-12-22 LAB — POCT RAPID STREP A (OFFICE): Rapid Strep A Screen: NEGATIVE
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
## 2021-12-22 NOTE — Patient Instructions (Addendum)
I think you have a viral respiratory infection. Pick up some Mucinex to help with the cough. Let me know if you develop any fevers or chills.   We will follow up with you regarding your lab work and chest xray

## 2021-12-23 ENCOUNTER — Telehealth: Payer: Self-pay | Admitting: Adult Health

## 2021-12-23 NOTE — Telephone Encounter (Signed)
Pt notified that labs and imaging have not been resulted yet but will call when they are. Pt verbalized understanding.

## 2021-12-23 NOTE — Telephone Encounter (Signed)
Pt is calling and would like xray and blood work results from yesterday

## 2021-12-29 ENCOUNTER — Telehealth (HOSPITAL_COMMUNITY): Payer: Self-pay

## 2021-12-29 NOTE — Telephone Encounter (Signed)
Attempted to contact patient to reschedule (cancelled appt on 12/18 due to patient being sick with flu) Modified Barium Swallow - left voicemail.

## 2021-12-29 NOTE — Telephone Encounter (Signed)
Patient notified of update  and verbalized understanding. 

## 2021-12-29 NOTE — Telephone Encounter (Signed)
Pt calling regarding lab results. °

## 2021-12-29 NOTE — Telephone Encounter (Signed)
Pt called back stating he thinks he missed a call from the office

## 2022-01-06 ENCOUNTER — Telehealth (HOSPITAL_COMMUNITY): Payer: Self-pay

## 2022-01-06 NOTE — Telephone Encounter (Signed)
2nd attempt to contact patient to reschedule Modified Barium Swallow - left voicemail.

## 2022-01-07 ENCOUNTER — Telehealth: Payer: Self-pay | Admitting: Pharmacist

## 2022-01-07 NOTE — Progress Notes (Signed)
Care Coordination Pharmacy Assistant   Name: Dylan Lane  MRN: [id number] DOB: 11-Apr-1950  Care Management & Coordination Services Pharmacy Team  Reason for Encounter: Medication coordination and delivery  Contacted patient on 01/07/2022 to discuss medications   Recent office visits:  12/22/2021 [name] - Patient was seen for pre-diabetes and additional concerns. No medication changes.   Recent consult visits:  None  Hospital visits:  None  Medications: Outpatient Encounter Medications as of 01/07/2022  Medication Sig   alfuzosin (UROXATRAL) 10 MG 24 hr tablet Take 10 mg by mouth daily.   amLODipine (NORVASC) 10 MG tablet TAKE ONE TABLET BY MOUTH ONCE DAILY   aspirin 81 MG EC tablet Take 1 tablet (81 mg total) by mouth daily.   atorvastatin (LIPITOR) 80 MG tablet TAKE ONE TABLET BY MOUTH ONCE DAILY   clopidogrel (PLAVIX) 75 MG tablet TAKE ONE TABLET BY MOUTH ONCE DAILY   cyclobenzaprine (FLEXERIL) 10 MG tablet Take 1 tablet (10 mg total) by mouth 3 (three) times daily as needed for muscle spasms.   metFORMIN (GLUCOPHAGE-XR) 500 MG 24 hr tablet TAKE ONE TABLET BY MOUTH EVERY MORNING WITH BREAKFAST   metoprolol succinate (TOPROL-XL) 25 MG 24 hr tablet TAKE ONE TABLET BY MOUTH ONCE DAILY   nitroGLYCERIN (NITROSTAT) 0.4 MG SL tablet DISSOLVE 1 TABLET UNDER THE TONGUE EVERY 5 MINUTES AS NEEDED FOR CHEST PAIN. DO NOT EXCEED A TOTAL OF 3 DOSES IN 15 MINUTES.   pantoprazole (PROTONIX) 40 MG tablet Take 1 tablet (40 mg total) by mouth 2 (two) times daily.   Facility-Administered Encounter Medications as of 01/07/2022  Medication   sodium chloride flush (NS) 0.9 % injection 3 mL   BP Readings from Last 3 Encounters:  12/22/21 110/60  11/23/21 128/80  10/13/21 118/80    Pulse Readings from Last 3 Encounters:  12/22/21 90  11/23/21 (!) 55  10/13/21 77    Lab Results  Component Value Date/Time   HGBA1C 6.0 (A) 12/22/2021 02:10 PM   HGBA1C 6.3 07/22/2021 08:12 AM    HGBA1C 6.5 07/16/2020 09:51 AM   Lab Results  Component Value Date   CREATININE 1.22 12/22/2021   BUN 14 12/22/2021   GFR 60.67 12/22/2021   GFRNONAA >60 11/20/2019   GFRAA 70 10/08/2019   NA 142 12/22/2021   K 3.7 12/22/2021   CALCIUM 10.4 12/22/2021   CO2 30 12/22/2021   Last adherence delivery date:12/20/2021      Patient is due for next adherence delivery on: 01/19/2022  Spoke with patient on 01/07/2022 reviewed medications and coordinated delivery.  This delivery to include: Vials  30 Days  Alfuzosin ER '10mg'$  - 1 tablet once daily Metformin ER 500 mg - 1 tablet with breakfast Metoprolol succinate '25mg'$  - 1 tablet once daily Pantoprazole '40mg'$  - 1 tablet twice daily Clopidogrel 75 mg - 1 tablet once daily  Amlodipine '10mg'$  - 1 tablet once daily Atorvastatin '80mg'$  - 1 tablet once daily  Patient declined the following medications this month: No medications declined  Confirmed delivery date of 01/19/2022, advised patient that pharmacy will contact them the morning of delivery.  Any concerns about your medications? Patient denies  How often do you forget or accidentally miss a dose? Patient denies  Cycle dispensing form sent to [facility] for review.  Care Gaps: AWV - scheduled 05/12/2022 Last BP - 110/60 on 12/22/2021 Last A1C - 6.0 on 12/22/2021 Tdap - never done Flu - overdue Covid - overdue Colonoscopy - postponed  Star Rating  Drugs: Metformin '500mg'$  - last filled 12/16/2021 30 DS at [facility] Atorvastatin 80 mg - last filled 12/16/2021 30 DS at [facility] [phone]

## 2022-01-13 ENCOUNTER — Telehealth (HOSPITAL_COMMUNITY): Payer: Self-pay

## 2022-01-13 NOTE — Telephone Encounter (Signed)
3rd attempt to contact patient to reschedule Modified Barium Swallow - left voicemail.  Will close order if no return call by 1/18.

## 2022-01-24 DIAGNOSIS — R682 Dry mouth, unspecified: Secondary | ICD-10-CM | POA: Diagnosis not present

## 2022-01-24 DIAGNOSIS — R351 Nocturia: Secondary | ICD-10-CM | POA: Diagnosis not present

## 2022-01-24 DIAGNOSIS — N401 Enlarged prostate with lower urinary tract symptoms: Secondary | ICD-10-CM | POA: Diagnosis not present

## 2022-01-25 ENCOUNTER — Ambulatory Visit: Payer: Medicare HMO | Admitting: Family Medicine

## 2022-01-25 ENCOUNTER — Ambulatory Visit: Payer: Medicare HMO | Admitting: Adult Health

## 2022-02-07 ENCOUNTER — Telehealth: Payer: Self-pay

## 2022-02-07 NOTE — Progress Notes (Signed)
Care Management & Coordination Services Pharmacy Team  Reason for Encounter: Medication coordination and delivery  Contacted patient to discuss medications and coordinate delivery from [facility] pharmacy. Spoke with patient on 02/07/2022   Cycle dispensing form sent to [facility] for review.   Last adherence delivery date:01/19/2022      Patient is due for next adherence delivery on: 02/17/2022  This delivery to include: Vials  30 Days  Alfuzosin ER '10mg'$  - 1 tablet once daily Metformin ER 500 mg - 1 tablet with breakfast Metoprolol succinate '25mg'$  - 1 tablet once daily Pantoprazole '40mg'$  - 1 tablet twice daily Clopidogrel 75 mg - 1 tablet once daily  Amlodipine '10mg'$  - 1 tablet once daily Atorvastatin '80mg'$  - 1 tablet once daily  Patient declined the following medications this month: No medications declined  Confirmed delivery date of 02/17/2022, advised patient that pharmacy will contact them the morning of delivery.  Any concerns about your medications? Patient denies  How often do you forget or accidentally miss a dose? Patient denies any missed doses  Do you use a pillbox? Patient does not use a pill box  Is patient in packaging No   Recent blood pressure readings are as follows: Patient is not checking at home, he states his machine needs calibrated.   Recent blood glucose readings are as follows: Patient states he is not checking at home, his labs at his office visits are always good.   Chart review:  Recent office visits:  None  Recent consult visits:  None  Hospital visits:  None  Medications: Outpatient Encounter Medications as of 02/07/2022  Medication Sig   alfuzosin (UROXATRAL) 10 MG 24 hr tablet Take 10 mg by mouth daily.   amLODipine (NORVASC) 10 MG tablet TAKE ONE TABLET BY MOUTH ONCE DAILY   aspirin 81 MG EC tablet Take 1 tablet (81 mg total) by mouth daily.   atorvastatin (LIPITOR) 80 MG tablet TAKE ONE TABLET BY MOUTH ONCE DAILY   clopidogrel  (PLAVIX) 75 MG tablet TAKE ONE TABLET BY MOUTH ONCE DAILY   cyclobenzaprine (FLEXERIL) 10 MG tablet Take 1 tablet (10 mg total) by mouth 3 (three) times daily as needed for muscle spasms.   metFORMIN (GLUCOPHAGE-XR) 500 MG 24 hr tablet TAKE ONE TABLET BY MOUTH EVERY MORNING WITH BREAKFAST   metoprolol succinate (TOPROL-XL) 25 MG 24 hr tablet TAKE ONE TABLET BY MOUTH ONCE DAILY   nitroGLYCERIN (NITROSTAT) 0.4 MG SL tablet DISSOLVE 1 TABLET UNDER THE TONGUE EVERY 5 MINUTES AS NEEDED FOR CHEST PAIN. DO NOT EXCEED A TOTAL OF 3 DOSES IN 15 MINUTES.   pantoprazole (PROTONIX) 40 MG tablet Take 1 tablet (40 mg total) by mouth 2 (two) times daily.   Facility-Administered Encounter Medications as of 02/07/2022  Medication   sodium chloride flush (NS) 0.9 % injection 3 mL   BP Readings from Last 3 Encounters:  12/22/21 110/60  11/23/21 128/80  10/13/21 118/80    Pulse Readings from Last 3 Encounters:  12/22/21 90  11/23/21 (!) 55  10/13/21 77    Lab Results  Component Value Date/Time   HGBA1C 6.0 (A) 12/22/2021 02:10 PM   HGBA1C 6.3 07/22/2021 08:12 AM   HGBA1C 6.5 07/16/2020 09:51 AM   Lab Results  Component Value Date   CREATININE 1.22 12/22/2021   BUN 14 12/22/2021   GFR 60.67 12/22/2021   GFRNONAA >60 11/20/2019   GFRAA 70 10/08/2019   NA 142 12/22/2021   K 3.7 12/22/2021   CALCIUM 10.4 12/22/2021  CO2 30 12/22/2021   [place] Pharmacist Assistant [phone]

## 2022-03-07 ENCOUNTER — Other Ambulatory Visit: Payer: Self-pay

## 2022-03-07 MED ORDER — CLOPIDOGREL BISULFATE 75 MG PO TABS: 75.0000 mg | ORAL_TABLET | Freq: Every day | ORAL | 2 refills | Status: DC

## 2022-03-07 MED ORDER — METOPROLOL SUCCINATE ER 25 MG PO TB24: 25.0000 mg | ORAL_TABLET | Freq: Every day | ORAL | 2 refills | Status: DC

## 2022-03-07 NOTE — Addendum Note (Signed)
Addended by: Carter Kitten D on: 03/07/2022 01:24 PM   Modules accepted: Orders

## 2022-03-09 ENCOUNTER — Telehealth: Payer: Self-pay

## 2022-03-09 NOTE — Progress Notes (Unsigned)
Care Management & Coordination Services Pharmacy Team  Reason for Encounter: Medication coordination and delivery  Contacted patient to discuss medications and coordinate delivery from [facility] pharmacy. {US HC Outreach:[id number]}  Cycle dispensing form sent to *** for review.   Last adherence delivery date:02/17/2022      Patient is due for next adherence delivery on: 03/20/2021 SCHEDULE FOLLOW UP This delivery to include: Vials  30 Days  Alfuzosin ER '10mg'$  - 1 tablet once daily Metformin ER 500 mg - 1 tablet with breakfast Metoprolol succinate '25mg'$  - 1 tablet once daily Pantoprazole '40mg'$  - 1 tablet twice daily Clopidogrel 75 mg - 1 tablet once daily  Amlodipine '10mg'$  - 1 tablet once daily Atorvastatin '80mg'$  - 1 tablet once daily  Patient declined the following medications this month: No medications declined   {Delivery date:25786}  Any concerns about your medications? Patient denies   How often do you forget or accidentally miss a dose?  Patient denies any missed doses   Do you use a pillbox?  Patient does not use a pill box   Recent blood pressure readings are as follows:Patient is not checking at home, he states his machine needs calibrated.   Recent blood glucose readings are as follows:Patient states he is not checking at home, his labs at his office visits are always good.    Chart review: Recent office visits:  None  Recent consult visits:  None  Hospital visits:  None  Medications: Outpatient Encounter Medications as of 03/09/2022  Medication Sig   alfuzosin (UROXATRAL) 10 MG 24 hr tablet Take 10 mg by mouth daily.   amLODipine (NORVASC) 10 MG tablet TAKE ONE TABLET BY MOUTH ONCE DAILY   aspirin 81 MG EC tablet Take 1 tablet (81 mg total) by mouth daily.   atorvastatin (LIPITOR) 80 MG tablet TAKE ONE TABLET BY MOUTH ONCE DAILY   clopidogrel (PLAVIX) 75 MG tablet Take 1 tablet (75 mg total) by mouth daily.   cyclobenzaprine (FLEXERIL) 10 MG tablet Take 1 tablet  (10 mg total) by mouth 3 (three) times daily as needed for muscle spasms.   metFORMIN (GLUCOPHAGE-XR) 500 MG 24 hr tablet TAKE ONE TABLET BY MOUTH EVERY MORNING WITH BREAKFAST   metoprolol succinate (TOPROL-XL) 25 MG 24 hr tablet Take 1 tablet (25 mg total) by mouth daily.   nitroGLYCERIN (NITROSTAT) 0.4 MG SL tablet DISSOLVE 1 TABLET UNDER THE TONGUE EVERY 5 MINUTES AS NEEDED FOR CHEST PAIN. DO NOT EXCEED A TOTAL OF 3 DOSES IN 15 MINUTES.   pantoprazole (PROTONIX) 40 MG tablet Take 1 tablet (40 mg total) by mouth 2 (two) times daily.   Facility-Administered Encounter Medications as of 03/09/2022  Medication   sodium chloride flush (NS) 0.9 % injection 3 mL   BP Readings from Last 3 Encounters:  12/22/21 110/60  11/23/21 128/80  10/13/21 118/80    Pulse Readings from Last 3 Encounters:  12/22/21 90  11/23/21 (!) 55  10/13/21 77    Lab Results  Component Value Date/Time   HGBA1C 6.0 (A) 12/22/2021 02:10 PM   HGBA1C 6.3 07/22/2021 08:12 AM   HGBA1C 6.5 07/16/2020 09:51 AM   Lab Results  Component Value Date   CREATININE 1.22 12/22/2021   BUN 14 12/22/2021   GFR 60.67 12/22/2021   GFRNONAA >60 11/20/2019   GFRAA 70 10/08/2019   NA 142 12/22/2021   K 3.7 12/22/2021   CALCIUM 10.4 12/22/2021   CO2 30 12/22/2021   [place] Pharmacist Assistant [phone]

## 2022-03-22 ENCOUNTER — Other Ambulatory Visit: Payer: Self-pay

## 2022-03-22 ENCOUNTER — Telehealth: Payer: Self-pay | Admitting: Adult Health

## 2022-03-22 DIAGNOSIS — Z1211 Encounter for screening for malignant neoplasm of colon: Secondary | ICD-10-CM

## 2022-03-22 NOTE — Telephone Encounter (Signed)
Pt is aware can take up to 48 hrs pt does not want to make an appt

## 2022-03-22 NOTE — Telephone Encounter (Signed)
Spoke to pt and he stated he just wanted a referral to another GI office. Referral placed for [facility] for colon cancer screening. No further actions needed.

## 2022-03-22 NOTE — Telephone Encounter (Signed)
Has personal question for provider.

## 2022-04-06 ENCOUNTER — Other Ambulatory Visit: Payer: Self-pay | Admitting: Gastroenterology

## 2022-04-06 DIAGNOSIS — K219 Gastro-esophageal reflux disease without esophagitis: Secondary | ICD-10-CM

## 2022-04-07 ENCOUNTER — Telehealth: Payer: Self-pay

## 2022-04-07 NOTE — Progress Notes (Signed)
Care Management & Coordination Services Pharmacy Team  Reason for Encounter: Medication coordination and delivery  Contacted patient to discuss medications and coordinate delivery from [facility] pharmacy. Spoke with patient on 04/07/2022   Cycle dispensing form sent to [facility] for review.   Last adherence delivery date:03/21/2022      Patient is due for next adherence delivery on: 04/19/2022  This delivery to include: Vials  30 Days  Alfuzosin ER 10mg  - 1 tablet once daily Metformin ER 500 mg - 1 tablet with breakfast Metoprolol succinate 25mg  - 1 tablet once daily Pantoprazole 40mg  - 1 tablet twice daily Clopidogrel 75 mg - 1 tablet once daily  Amlodipine 10mg  - 1 tablet once daily Atorvastatin 80mg  - 1 tablet once daily  Patient declined the following medications this month: No medications declined   Confirmed delivery date of 04/19/2022, advised patient that pharmacy will contact them the morning of delivery.  Any concerns about your medications?  Patient denies   How often do you forget or accidentally miss a dose?  Patient denies   Do you use a pillbox? No  Recent blood pressure readings are as follows:  Patient is not checking at home   Recent blood glucose readings are as follows:  Patient states he is not checking at home, his labs at his office visits are always good.    Chart review: Recent office visits:  None  Recent consult visits:  None  Hospital visits:  None  Medications: Outpatient Encounter Medications as of 04/07/2022  Medication Sig   alfuzosin (UROXATRAL) 10 MG 24 hr tablet Take 10 mg by mouth daily.   amLODipine (NORVASC) 10 MG tablet TAKE ONE TABLET BY MOUTH ONCE DAILY   aspirin 81 MG EC tablet Take 1 tablet (81 mg total) by mouth daily.   atorvastatin (LIPITOR) 80 MG tablet TAKE ONE TABLET BY MOUTH ONCE DAILY   clopidogrel (PLAVIX) 75 MG tablet Take 1 tablet (75 mg total) by mouth daily.   cyclobenzaprine (FLEXERIL) 10 MG tablet Take  1 tablet (10 mg total) by mouth 3 (three) times daily as needed for muscle spasms.   metFORMIN (GLUCOPHAGE-XR) 500 MG 24 hr tablet TAKE ONE TABLET BY MOUTH EVERY MORNING WITH BREAKFAST   metoprolol succinate (TOPROL-XL) 25 MG 24 hr tablet Take 1 tablet (25 mg total) by mouth daily.   nitroGLYCERIN (NITROSTAT) 0.4 MG SL tablet DISSOLVE 1 TABLET UNDER THE TONGUE EVERY 5 MINUTES AS NEEDED FOR CHEST PAIN. DO NOT EXCEED A TOTAL OF 3 DOSES IN 15 MINUTES.   pantoprazole (PROTONIX) 40 MG tablet TAKE ONE TABLET BY MOUTH TWICE DAILY   Facility-Administered Encounter Medications as of 04/07/2022  Medication   sodium chloride flush (NS) 0.9 % injection 3 mL   BP Readings from Last 3 Encounters:  12/22/21 110/60  11/23/21 128/80  10/13/21 118/80    Pulse Readings from Last 3 Encounters:  12/22/21 90  11/23/21 (!) 55  10/13/21 77    Lab Results  Component Value Date/Time   HGBA1C 6.0 (A) 12/22/2021 02:10 PM   HGBA1C 6.3 07/22/2021 08:12 AM   HGBA1C 6.5 07/16/2020 09:51 AM   Lab Results  Component Value Date   CREATININE 1.22 12/22/2021   BUN 14 12/22/2021   GFR 60.67 12/22/2021   GFRNONAA >60 11/20/2019   GFRAA 70 10/08/2019   NA 142 12/22/2021   K 3.7 12/22/2021   CALCIUM 10.4 12/22/2021   CO2 30 12/22/2021   [place] Pharmacist Assistant [phone]
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
## 2022-04-15 ENCOUNTER — Ambulatory Visit: Payer: Medicare HMO | Attending: Internal Medicine | Admitting: Internal Medicine

## 2022-05-03 ENCOUNTER — Encounter: Payer: Self-pay | Admitting: Family Medicine

## 2022-05-03 ENCOUNTER — Other Ambulatory Visit: Payer: Self-pay | Admitting: Adult Health

## 2022-05-03 ENCOUNTER — Ambulatory Visit (INDEPENDENT_AMBULATORY_CARE_PROVIDER_SITE_OTHER): Payer: Medicare HMO | Admitting: Family Medicine

## 2022-05-03 VITALS — BP 132/74 | HR 66 | Temp 98.6°F | Ht 67.0 in | Wt 186.6 lb

## 2022-05-03 DIAGNOSIS — I2510 Atherosclerotic heart disease of native coronary artery without angina pectoris: Secondary | ICD-10-CM | POA: Diagnosis not present

## 2022-05-03 DIAGNOSIS — Z9861 Coronary angioplasty status: Secondary | ICD-10-CM

## 2022-05-03 DIAGNOSIS — I10 Essential (primary) hypertension: Secondary | ICD-10-CM

## 2022-05-03 DIAGNOSIS — S29011A Strain of muscle and tendon of front wall of thorax, initial encounter: Secondary | ICD-10-CM | POA: Diagnosis not present
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
## 2022-05-04 ENCOUNTER — Ambulatory Visit: Payer: Medicare HMO | Admitting: Family

## 2022-05-04 ENCOUNTER — Telehealth: Payer: Self-pay | Admitting: Adult Health

## 2022-05-04 ENCOUNTER — Ambulatory Visit: Payer: Medicare HMO | Admitting: Adult Health

## 2022-05-04 LAB — TROPONIN I: Troponin I: 6 ng/L (ref <=47)

## 2022-05-04 NOTE — Telephone Encounter (Signed)
Dylan Lane [id number]    Pt has called to hear the lab results from 4/30. Please call

## 2022-05-04 NOTE — Telephone Encounter (Signed)
Pt called to ask why he was put on another BP medication?  Pt states he had an EKG recently and his BP was "beautiful"  Pt would like a call back to discuss.

## 2022-05-04 NOTE — Telephone Encounter (Signed)
Patient calling to go over lab results from 05/03/22. Please advise

## 2022-05-04 NOTE — Telephone Encounter (Signed)
Left message to return phone call.

## 2022-05-04 NOTE — Telephone Encounter (Signed)
Pt stated that the amlodipine is a new medication and was wondering why he needs it. I informed pt that this was not a new Rx. Advised pt that his chart reflect metoprol and amlodipine for BP. Looks like he has been taking this since 2021. Will route to [facility] for advise. PT stated that he may be taking it if its in his pill box.    Please verify if pt is taking both

## 2022-05-04 NOTE — Telephone Encounter (Signed)
Contacted [name] to schedule their annual wellness visit. Appointment made for 05/12/22.  Rudell Cobb AWV direct phone # [phone]  Due to schedule awv 5/9 appt has been moved to [employer] schedule to 9 Pt aware of time change

## 2022-05-05 NOTE — Telephone Encounter (Signed)
Patient notified of update  and verbalized understanding. 

## 2022-05-05 NOTE — Telephone Encounter (Signed)
Patient aware of results.

## 2022-05-05 NOTE — Telephone Encounter (Signed)
Left message to return phone call.

## 2022-05-06 ENCOUNTER — Telehealth: Payer: Self-pay

## 2022-05-06 NOTE — Progress Notes (Unsigned)
Care Management & Coordination Services Pharmacy Note  05/09/2022 Name:  Dylan Lane MRN:  [id number] DOB:  1950/05/04  Summary: BP at goal <140/90 LDL at goal <70  Recommendations/Changes made from today's visit: -Counseled to check BP once weekly and keep a log -Counseled to continue a low-carb diet and limit/avoid fried, fatty foods  Follow up plan: BP call in 3 months Pharmacist visit in 6 months   Subjective: Dylan Lane is an 72 y.o. year old male who is a primary patient of Shirline Frees, NP.  The care coordination team was consulted for assistance with disease management and care coordination needs.    Engaged with patient by telephone for follow up visit.  Recent office visits: 12/22/2021 Shirline Frees NP - Patient was seen for pre-diabetes and additional concerns. No medication changes.    Recent consult visits: 05/03/22 Nadene Rubins, MD -  For muscle strain, recommend tylenol and ice pack  11/23/2021 Verdis Prime MD (cardiology) - Patient was seen for angina pectoris and additional concerns. Discontinued Atorvastatin. Follow up in 1 year.    11/10/2021 Scot Jun PA-C (ENT) - Patient was seen for dysphagia, unspecified. Referral to speech therapy. No medication changes. No follow up noted.   Hospital visits: None in previous 6 months   Objective:  Lab Results  Component Value Date   CREATININE 1.22 12/22/2021   BUN 14 12/22/2021   GFR 60.67 12/22/2021   EGFR 76 12/08/2020   GFRNONAA >60 11/20/2019   GFRAA 70 10/08/2019   NA 142 12/22/2021   K 3.7 12/22/2021   CALCIUM 10.4 12/22/2021   CO2 30 12/22/2021   GLUCOSE 110 (H) 12/22/2021    Lab Results  Component Value Date/Time   HGBA1C 6.0 (A) 12/22/2021 02:10 PM   HGBA1C 6.3 07/22/2021 08:12 AM   HGBA1C 6.5 07/16/2020 09:51 AM   GFR 60.67 12/22/2021 02:23 PM   GFR 66.71 07/22/2021 08:12 AM   MICROALBUR <0.7 07/22/2021 08:12 AM    Last diabetic Eye exam: No results found for:  "HMDIABEYEEXA"  Last diabetic Foot exam: No results found for: "HMDIABFOOTEX"   Lab Results  Component Value Date   CHOL 95 07/22/2021   HDL 31.50 (L) 07/22/2021   LDLCALC 54 07/22/2021   TRIG 48.0 07/22/2021   CHOLHDL 3 07/22/2021       Latest Ref Rng & Units 07/22/2021    8:12 AM 07/16/2020    9:51 AM 11/04/2019    1:45 AM  Hepatic Function  Total Protein 6.0 - 8.3 g/dL 6.8  6.9  6.9   Albumin 3.5 - 5.2 g/dL 4.5  4.6  4.1   AST 0 - 37 U/L 16  19  16    ALT 0 - 53 U/L 17  26  22    Alk Phosphatase 39 - 117 U/L 101  120  91   Total Bilirubin 0.2 - 1.2 mg/dL 0.8  0.9  0.7     Lab Results  Component Value Date/Time   TSH 2.45 07/22/2021 08:12 AM   TSH 1.55 07/16/2020 09:51 AM       Latest Ref Rng & Units 12/22/2021    2:23 PM 07/22/2021    8:12 AM 12/08/2020   11:50 AM  CBC  WBC 4.0 - 10.5 K/uL 8.3  5.5  6.0   Hemoglobin 13.0 - 17.0 g/dL 78.2  95.6  21.3   Hematocrit 39.0 - 52.0 % 41.3  41.6  41.1   Platelets 150.0 - 400.0 K/uL 252.0  164.0  178     Lab Results  Component Value Date/Time   VD25OH 15 (L) 06/05/2013 11:58 AM    Clinical ASCVD: Yes  The ASCVD Risk score (Arnett DK, et al., 2019) failed to calculate for the following reasons:   The patient has a prior MI or stroke diagnosis       10/05/2021    8:36 AM 07/22/2021    8:12 AM 05/10/2021    8:24 AM  Depression screen PHQ 2/9  Decreased Interest  0 0  Down, Depressed, Hopeless 0 0 0  PHQ - 2 Score 0 0 0  Tired, decreased energy 0    Feeling bad or failure about yourself  0    Trouble concentrating 0    Moving slowly or fidgety/restless 0    Suicidal thoughts 0    Difficult doing work/chores Not difficult at all       Social History   Tobacco Use  Smoking Status Former   Types: Cigarettes   Quit date: 03/14/2000   Years since quitting: 22.1  Smokeless Tobacco Never   BP Readings from Last 3 Encounters:  05/03/22 132/74  12/22/21 110/60  11/23/21 128/80   Pulse Readings from Last 3  Encounters:  05/03/22 66  12/22/21 90  11/23/21 (!) 55   Wt Readings from Last 3 Encounters:  05/03/22 186 lb 9.6 oz (84.6 kg)  12/22/21 183 lb (83 kg)  11/23/21 186 lb 9.6 oz (84.6 kg)   BMI Readings from Last 3 Encounters:  05/03/22 29.23 kg/m  12/22/21 28.66 kg/m  11/23/21 29.23 kg/m    Allergies  Allergen Reactions   Amoxicillin Hives         Medications Reviewed Today     Reviewed by Sherrill Raring, RPH (Pharmacist) on 05/09/22 at 1529  Med List Status: <None>   Medication Order Taking? Sig Documenting Provider Last Dose Status Informant  alfuzosin (UROXATRAL) 10 MG 24 hr tablet [id number] No Take 10 mg by mouth daily. [provider] Taking Active   amLODipine (NORVASC) 10 MG tablet [id number] No TAKE ONE TABLET BY MOUTH ONCE DAILY Nafziger, Kandee Keen, NP Taking Active   aspirin 81 MG EC tablet [id number] No Take 1 tablet (81 mg total) by mouth daily. Dow Adolph, MD Taking Active Self  atorvastatin (LIPITOR) 80 MG tablet [id number] No TAKE ONE TABLET BY MOUTH ONCE DAILY Lyn Records, MD Taking Active   clopidogrel (PLAVIX) 75 MG tablet [id number] No Take 1 tablet (75 mg total) by mouth daily. Wendall Stade, MD Taking Active   cyclobenzaprine (FLEXERIL) 10 MG tablet [id number] No Take 1 tablet (10 mg total) by mouth 3 (three) times daily as needed for muscle spasms.  Patient not taking: Reported on 05/03/2022   Shirline Frees, NP Not Taking Active   metFORMIN (GLUCOPHAGE-XR) 500 MG 24 hr tablet [id number] No TAKE ONE TABLET BY MOUTH EVERY MORNING WITH BREAKFAST Nafziger, Kandee Keen, NP Taking Active   metoprolol succinate (TOPROL-XL) 25 MG 24 hr tablet [id number] No Take 1 tablet (25 mg total) by mouth daily. Wendall Stade, MD Taking Active   nitroGLYCERIN (NITROSTAT) 0.4 MG SL tablet [id number] No DISSOLVE 1 TABLET UNDER THE TONGUE EVERY 5 MINUTES AS NEEDED FOR CHEST PAIN. DO NOT EXCEED A TOTAL OF 3 DOSES IN 15 MINUTES.  Patient not taking: Reported on 05/03/2022    Lyn Records, MD Not Taking Active   pantoprazole (PROTONIX) 40 MG tablet [id number] No TAKE ONE TABLET BY MOUTH TWICE DAILY Zehr, Princella Pellegrini, PA-C  Taking Active   sodium chloride flush (NS) 0.9 % injection 3 mL [id number]   Wendall Stade, MD  Active             SDOH:  (Social Determinants of Health) assessments and interventions performed: Yes SDOH Interventions    Flowsheet Row Care Coordination from 05/09/2022 in [facility] Chronic Care Management from 10/25/2021 in [facility] [place] Clinical Support from 05/10/2021 in [facility] [place] Chronic Care Management from 05/19/2020 in [facility] [place] Clinical Support from 04/20/2020 in [facility] [place] [facility] [place] Telephone from 11/06/2019 in [facility]  SDOH Interventions        Food Insecurity Interventions Intervention Not Indicated -- Intervention Not Indicated -- Intervention Not Indicated Intervention Not Indicated  [pt already has food stamps]  Housing Interventions Intervention Not Indicated -- Intervention Not Indicated -- Intervention Not Indicated Other (Comment)  [referral to [employer] for rental assistance]  Transportation Interventions -- -- Intervention Not Indicated Intervention Not Indicated Intervention Not Indicated [employer], Other (Comment)  [emailed pt with transportation resources]  Financial Strain Interventions -- Intervention Not Indicated Intervention Not Indicated Other (Comment)  [working on switching to lower cost effective alternatives] Intervention Not Indicated Other (Comment)  [HOPE Program]  Physical Activity Interventions -- -- Intervention Not Indicated -- Intervention Not Indicated --  Stress Interventions -- -- Intervention Not Indicated -- Intervention Not Indicated --  Social Connections Interventions -- -- Intervention Not Indicated -- -- --       Medication  Assistance: None required.  Patient affirms current coverage meets needs.  Medication Access: Within the past 30 days, how often has patient missed a dose of medication? None Is a pillbox or other method used to improve adherence? Yes  Factors that may affect medication adherence? no barriers identified Are meds synced by current pharmacy? Yes  Are meds delivered by current pharmacy? Yes  Does patient experience delays in picking up medications due to transportation concerns? No   [facility] Reviewed: Is patient disadvantaged to use [facility]?: No  Current Rx insurance plan: Community education officer Name and location of Current pharmacy:  [facility] - [place], [place] - [address] [place] [address] Phone: [phone] Fax: [phone]  [facility] services reviewed with patient today?: No  Patient requests to transfer care to [facility]?: No  Reason patient declined to change pharmacies: Patient is already actively enrolled with [facility]  Compliance/Adherence/Medication fill history: Care Gaps: AWV - scheduled 05/12/2022 Covid - postponed Colonoscopy - postponed - established with GI, patient plans to discuss with them  Star-Rating Drugs: Metformin 500mg  - last filled 04/13/2022 30 DS at [facility] Atorvastatin 80 mg - last filled 04/13/2022 30 [facility] at [facility]     Assessment/Plan Hypertension (BP goal <130/80) -Controlled -Current treatment: Amlodipine 10mg  1 qd Appropriate, Effective, Safe, Accessible Metoprolol XL 25mg  1 qd Appropriate, Effective, Safe, Accessible -Medications previously tried: HCTZ  -Current home readings: machine broke -Current dietary habits: mindful of salt intake -Current exercise habits: not discussed -Denies hypotensive/hypertensive symptoms -Educated on BP goals and benefits of medications for prevention of heart attack, stroke and kidney damage; Daily salt intake  goal < 2300 mg; Importance of home blood pressure monitoring; Proper BP monitoring technique; -Counseled to monitor BP at home once weekly, document, and provide log at future appointments -Counseled on diet and exercise extensively Recommended to continue current medication  Hyperlipidemia: (LDL goal < 55) -Controlled -Current treatment: Atorvastating 80mg  1 qd Appropriate, Effective, Safe, Accessible -Medications previously tried: Pravastatin  -Current dietary patterns: mindful of fried foods -Current exercise habits:  -Educated on Cholesterol goals;  Benefits of statin for ASCVD risk reduction; Importance of limiting foods high in cholesterol; -Recommended to continue current medication  Sherrill Raring Clinical Pharmacist [phone]

## 2022-05-06 NOTE — Progress Notes (Signed)
Care Management & Coordination Services Pharmacy Team  Reason for Encounter: Appointment Reminder  Contacted patient to confirm telephone appointment with Delano Metz, PharmD on 05/09/2022 at 3:30. Spoke with patient on 05/06/2022   Do you have any problems getting your medications? Patient denies  What is your top health concern you would like to discuss at your upcoming visit? Patient denies  Have you seen any other providers since your last visit with PCP? Patient denies   Care Gaps: AWV - scheduled 05/12/2022 Last A1C - 6.0 on 12/22/2021 Covid - postponed Colonoscopy - postponed   Star Rating Drugs: Metformin 500mg  - last filled 04/13/2022 30 [facility] Atorvastatin 80 mg - last filled 04/13/2022 30 DS at [facility]  Inetta Fermo [facility]  Clinical Pharmacist Assistant [phone]

## 2022-05-09 ENCOUNTER — Ambulatory Visit: Payer: Medicare HMO

## 2022-05-10 ENCOUNTER — Telehealth: Payer: Self-pay

## 2022-05-10 MED ORDER — METFORMIN HCL ER 500 MG PO TB24: ORAL_TABLET | ORAL | 1 refills | Status: DC

## 2022-05-10 NOTE — Telephone Encounter (Signed)
Rx refilled per request of pt and pharmacist.

## 2022-05-12 ENCOUNTER — Encounter: Payer: Medicare HMO | Admitting: Family Medicine

## 2022-05-12 ENCOUNTER — Encounter (INDEPENDENT_AMBULATORY_CARE_PROVIDER_SITE_OTHER): Payer: Medicare HMO | Admitting: Family Medicine

## 2022-05-12 ENCOUNTER — Encounter: Payer: Self-pay | Admitting: Family Medicine

## 2022-05-12 NOTE — Progress Notes (Signed)
Error. Patient at work and needed to reschedule per staff.

## 2022-05-12 NOTE — Progress Notes (Signed)
Patient requested to reschedule to next week as is working.

## 2022-05-17 ENCOUNTER — Encounter: Payer: Self-pay | Admitting: Family Medicine

## 2022-05-17 ENCOUNTER — Telehealth (INDEPENDENT_AMBULATORY_CARE_PROVIDER_SITE_OTHER): Payer: Medicare HMO | Admitting: Family Medicine

## 2022-05-17 VITALS — Ht 67.0 in | Wt 186.0 lb

## 2022-05-17 DIAGNOSIS — Z0000 Encounter for general adult medical examination without abnormal findings: Secondary | ICD-10-CM

## 2022-05-17 NOTE — Patient Instructions (Addendum)
I really enjoyed getting to talk with you today! I am available on Tuesdays and Thursdays for virtual visits if you have any questions or concerns, or if I can be of any further assistance.   CHECKLIST FROM ANNUAL WELLNESS VISIT:  -Follow up (please call to schedule if not scheduled after visit):   -yearly for annual wellness visit with primary care office  Here is a list of your preventive care/health maintenance measures and the plan for each if any are due:  PLAN For any measures below that may be due:  -call your gastroenterologist about colon cancer screening -can get covid booster at pharmacy  Health Maintenance  Topic Date Due   COLONOSCOPY (Pts 45-105yrs Insurance coverage will need to be confirmed)  03/21/2021   COVID-19 Vaccine (4 - 2023-24 season) 05/19/2022 (Originally 09/03/2021)   INFLUENZA VACCINE  08/04/2022   Medicare Annual Wellness (AWV)  05/17/2023   Pneumonia Vaccine 32+ Years old  Completed   Hepatitis C Screening  Completed   Zoster Vaccines- Shingrix  Completed   HPV VACCINES  Aged Out   DTaP/Tdap/Td  Discontinued    -See a dentist at least yearly  -Get your eyes checked and then per your eye specialist's recommendations  -Other issues addressed today:  -increase exercise to 150 minutes per week, 2 times per week work on improving muscle strength -increase fresh fruit to two servings per day and veggies to 3 servings per day   -I have included below further information regarding a healthy whole foods based diet, physical activity guidelines for adults, stress management and opportunities for social connections. I hope you find this information useful.   -----------------------------------------------------------------------------------------------------------------------------------------------------------------------------------------------------------------------------------------------------------  NUTRITION: -eat real food: lots of colorful  vegetables (half the plate) and fruits -5-7 servings of vegetables and fruits per day (fresh or steamed is best), exp. 2 servings of vegetables with lunch and dinner and 2 servings of fruit per day. Berries and greens such as kale and collards are great choices.  -consume on a regular basis: whole grains (make sure first ingredient on label contains the word "whole"), fresh fruits, fish, nuts, seeds, healthy oils (such as olive oil, avocado oil, grape seed oil) -may eat small amounts of dairy and lean meat on occasion, but avoid processed meats such as ham, bacon, lunch meat, etc. -drink water -try to avoid fast food and pre-packaged foods, processed meat -most experts advise limiting sodium to < 2300mg  per day, should limit further is any chronic conditions such as high blood pressure, heart disease, diabetes, etc. The American Heart Association advised that < 1500mg  is is ideal -try to avoid foods that contain any ingredients with names you do not recognize  -try to avoid sugar/sweets (except for the natural sugar that occurs in fresh fruit) -try to avoid sweet drinks -try to avoid white rice, white bread, pasta (unless whole grain), white or yellow potatoes  EXERCISE GUIDELINES FOR ADULTS: -if you wish to increase your physical activity, do so gradually and with the approval of your doctor -STOP and seek medical care immediately if you have any chest pain, chest discomfort or trouble breathing when starting or increasing exercise  -move and stretch your body, legs, feet and arms when sitting for long periods -Physical activity guidelines for optimal health in adults: -least 150 minutes per week of aerobic exercise (can talk, but not sing) once approved by your doctor, 20-30 minutes of sustained activity or two 10 minute episodes of sustained activity every day.  -resistance training at least  2 days per week if approved by your doctor -balance exercises 3+ days per week:   Stand somewhere where  you have something sturdy to hold onto if you lose balance.    1) lift up on toes, start with 5x per day and work up to 20x   2) stand and lift on leg straight out to the side so that foot is a few inches of the floor, start with 5x each side and work up to 20x each side   3) stand on one foot, start with 5 seconds each side and work up to 20 seconds on each side  If you need ideas or help with getting more active:  -Silver sneakers https://tools.silversneakers.com  -Walk with a Doc: http://www.duncan-williams.com/  -try to include resistance (weight lifting/strength building) and balance exercises twice per week: or the following link for ideas: http://castillo-powell.com/  BuyDucts.dk  STRESS MANAGEMENT: -can try meditating, or just sitting quietly with deep breathing while intentionally relaxing all parts of your body for 5 minutes daily -if you need further help with stress, anxiety or depression please follow up with your primary doctor or contact the wonderful folks at [facility]: [phone]  SOCIAL CONNECTIONS: -options in [place] if you wish to engage in more social and exercise related activities:  -Silver sneakers https://tools.silversneakers.com  -Walk with a Doc: http://www.duncan-williams.com/  -Check out the [facility] Active Adults 50+ section on the [place] of [employer] (hiking clubs, book clubs, cards and games, chess, exercise classes, aquatic classes and much more) - see the website for details: https://www.[place].gov/departments/parks-recreation/active-adults50  -YouTube has lots of exercise videos for different ages and abilities as well  -[facility] (a variety of indoor and outdoor inperson activities for adults). 3096294403. [address]  -Virtual Online Classes (a variety of topics): see seniorplanet.org or call  [phone]  -consider volunteering at a school, hospice center, church, senior center or elsewhere
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
## 2022-06-07 ENCOUNTER — Telehealth: Payer: Self-pay

## 2022-06-07 ENCOUNTER — Other Ambulatory Visit: Payer: Self-pay | Admitting: Adult Health

## 2022-06-07 NOTE — Progress Notes (Signed)
Care Management & Coordination Services Pharmacy Team  Reason for Encounter: Medication coordination and delivery  Contacted patient to discuss medications and coordinate delivery from [facility]. Spoke with patient on 06/07/2022   Cycle dispensing form sent to [facility] for review.   Last adherence delivery date: 05/18/2022      Patient is due for next adherence delivery on: 06/17/2022  This delivery to include: Vials  30 Days  Alfuzosin ER 10mg  - 1 tablet once daily Metformin ER 500 mg - 1 tablet with breakfast Metoprolol succinate 25mg  - 1 tablet once daily Pantoprazole 40mg  - 1 tablet twice daily Clopidogrel 75 mg - 1 tablet once daily  Amlodipine 10mg  - 1 tablet once daily Atorvastatin 80mg  - 1 tablet once daily  Patient declined the following medications this month: No medications declined   Confirmed delivery date of 06/17/2022, advised patient that pharmacy will contact them the morning of delivery.  Any concerns about your medications? Patient denies  How often do you forget or accidentally miss a dose? Patient states it's very rare  Do you use a pillbox? Yes  Recent blood pressure readings are as follows: Patient is not checking at home   Recent blood glucose readings are as follows: Patient is not checking at home    Chart review: Recent office visits:  05/17/2022 Kriste Basque DO - Patient was seen for encounter for Medicare annual wellness exam. No medication changes.   Recent consult visits:  None  Hospital visits:  None  Medications: Outpatient Encounter Medications as of 06/07/2022  Medication Sig   alfuzosin (UROXATRAL) 10 MG 24 hr tablet Take 10 mg by mouth daily.   amLODipine (NORVASC) 10 MG tablet TAKE ONE TABLET BY MOUTH ONCE DAILY   aspirin 81 MG EC tablet Take 1 tablet (81 mg total) by mouth daily.   atorvastatin (LIPITOR) 80 MG tablet TAKE ONE TABLET BY MOUTH ONCE DAILY   clopidogrel (PLAVIX) 75 MG tablet Take 1 tablet (75 mg  total) by mouth daily.   cyclobenzaprine (FLEXERIL) 10 MG tablet Take 1 tablet (10 mg total) by mouth 3 (three) times daily as needed for muscle spasms.   metFORMIN (GLUCOPHAGE-XR) 500 MG 24 hr tablet TAKE ONE TABLET BY MOUTH EVERY MORNING WITH BREAKFAST   metoprolol succinate (TOPROL-XL) 25 MG 24 hr tablet Take 1 tablet (25 mg total) by mouth daily.   nitroGLYCERIN (NITROSTAT) 0.4 MG SL tablet DISSOLVE 1 TABLET UNDER THE TONGUE EVERY 5 MINUTES AS NEEDED FOR CHEST PAIN. DO NOT EXCEED A TOTAL OF 3 DOSES IN 15 MINUTES.   pantoprazole (PROTONIX) 40 MG tablet TAKE ONE TABLET BY MOUTH TWICE DAILY   Facility-Administered Encounter Medications as of 06/07/2022  Medication   sodium chloride flush (NS) 0.9 % injection 3 mL   BP Readings from Last 3 Encounters:  05/03/22 132/74  12/22/21 110/60  11/23/21 128/80    Pulse Readings from Last 3 Encounters:  05/03/22 66  12/22/21 90  11/23/21 (!) 55    Lab Results  Component Value Date/Time   HGBA1C 6.0 (A) 12/22/2021 02:10 PM   HGBA1C 6.3 07/22/2021 08:12 AM   HGBA1C 6.5 07/16/2020 09:51 AM   Lab Results  Component Value Date   CREATININE 1.22 12/22/2021   BUN 14 12/22/2021   GFR 60.67 12/22/2021   GFRNONAA >60 11/20/2019   GFRAA 70 10/08/2019   NA 142 12/22/2021   K 3.7 12/22/2021   CALCIUM 10.4 12/22/2021   CO2 30 12/22/2021   Inetta Fermo CMA  Clinical  Pharmacist Assistant [phone]

## 2022-07-20 DIAGNOSIS — N3941 Urge incontinence: Secondary | ICD-10-CM | POA: Diagnosis not present

## 2022-07-20 DIAGNOSIS — N401 Enlarged prostate with lower urinary tract symptoms: Secondary | ICD-10-CM | POA: Diagnosis not present

## 2022-07-20 DIAGNOSIS — R351 Nocturia: Secondary | ICD-10-CM | POA: Diagnosis not present

## 2022-07-26 ENCOUNTER — Ambulatory Visit (INDEPENDENT_AMBULATORY_CARE_PROVIDER_SITE_OTHER): Payer: Medicare HMO | Admitting: Adult Health

## 2022-07-26 ENCOUNTER — Encounter: Payer: Self-pay | Admitting: Adult Health

## 2022-07-26 VITALS — BP 130/80 | HR 62 | Temp 98.1°F | Ht 66.25 in | Wt 191.0 lb

## 2022-07-26 DIAGNOSIS — K219 Gastro-esophageal reflux disease without esophagitis: Secondary | ICD-10-CM

## 2022-07-26 DIAGNOSIS — I2510 Atherosclerotic heart disease of native coronary artery without angina pectoris: Secondary | ICD-10-CM | POA: Diagnosis not present

## 2022-07-26 DIAGNOSIS — R7303 Prediabetes: Secondary | ICD-10-CM | POA: Diagnosis not present

## 2022-07-26 DIAGNOSIS — N400 Enlarged prostate without lower urinary tract symptoms: Secondary | ICD-10-CM

## 2022-07-26 DIAGNOSIS — E785 Hyperlipidemia, unspecified: Secondary | ICD-10-CM | POA: Diagnosis not present

## 2022-07-26 DIAGNOSIS — Z0000 Encounter for general adult medical examination without abnormal findings: Secondary | ICD-10-CM | POA: Diagnosis not present

## 2022-07-26 DIAGNOSIS — Z9861 Coronary angioplasty status: Secondary | ICD-10-CM | POA: Diagnosis not present
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
## 2022-07-26 NOTE — Patient Instructions (Addendum)
It was great seeing you today   Please schedule a lab appointment

## 2022-08-01 ENCOUNTER — Other Ambulatory Visit: Payer: Medicare HMO

## 2022-08-02 ENCOUNTER — Other Ambulatory Visit (INDEPENDENT_AMBULATORY_CARE_PROVIDER_SITE_OTHER): Payer: Medicare HMO

## 2022-08-02 ENCOUNTER — Telehealth: Payer: Self-pay | Admitting: Adult Health

## 2022-08-02 DIAGNOSIS — R7303 Prediabetes: Secondary | ICD-10-CM | POA: Diagnosis not present

## 2022-08-02 DIAGNOSIS — E785 Hyperlipidemia, unspecified: Secondary | ICD-10-CM

## 2022-08-02 DIAGNOSIS — Z9861 Coronary angioplasty status: Secondary | ICD-10-CM

## 2022-08-02 DIAGNOSIS — I2510 Atherosclerotic heart disease of native coronary artery without angina pectoris: Secondary | ICD-10-CM

## 2022-08-02 DIAGNOSIS — K219 Gastro-esophageal reflux disease without esophagitis: Secondary | ICD-10-CM | POA: Diagnosis not present

## 2022-08-02 LAB — COMPREHENSIVE METABOLIC PANEL
ALT: 26 U/L (ref 0–53)
AST: 19 U/L (ref 0–37)
Albumin: 4.3 g/dL (ref 3.5–5.2)
Alkaline Phosphatase: 106 U/L (ref 39–117)
BUN: 15 mg/dL (ref 6–23)
CO2: 30 mEq/L (ref 19–32)
Calcium: 10 mg/dL (ref 8.4–10.5)
Chloride: 104 mEq/L (ref 96–112)
Creatinine, Ser: 1.22 mg/dL (ref 0.40–1.50)
GFR: 60.41 mL/min (ref >60.00)
Glucose, Bld: 113 mg/dL — ABNORMAL HIGH (ref 70–99)
Potassium: 4 mEq/L (ref 3.5–5.1)
Sodium: 141 mEq/L (ref 135–145)
Total Bilirubin: 0.7 mg/dL (ref 0.2–1.2)
Total Protein: 7 g/dL (ref 6.0–8.3)

## 2022-08-02 LAB — LIPID PANEL
Cholesterol: 121 mg/dL (ref 0–200)
HDL: 40.8 mg/dL (ref >39.00)
LDL Cholesterol: 69 mg/dL (ref 0–99)
NonHDL: 80.66
Total CHOL/HDL Ratio: 3
Triglycerides: 56 mg/dL (ref 0.0–149.0)
VLDL: 11.2 mg/dL (ref 0.0–40.0)

## 2022-08-02 LAB — CBC
HCT: 43.9 % (ref 39.0–52.0)
Hemoglobin: 14.6 g/dL (ref 13.0–17.0)
MCHC: 33.4 g/dL (ref 30.0–36.0)
MCV: 87.2 fl (ref 78.0–100.0)
Platelets: 172 10*3/uL (ref 150.0–400.0)
RBC: 5.03 Mil/uL (ref 4.22–5.81)
RDW: 14 % (ref 11.5–15.5)
WBC: 5.5 10*3/uL (ref 4.0–10.5)

## 2022-08-02 LAB — HEMOGLOBIN A1C: Hgb A1c MFr Bld: 6.3 % (ref 4.6–6.5)

## 2022-08-02 LAB — TSH: TSH: 1.97 u[IU]/mL (ref 0.35–5.50)

## 2022-08-02 NOTE — Telephone Encounter (Signed)
Patient notified of update  and verbalized understanding. 

## 2022-08-02 NOTE — Telephone Encounter (Signed)
Pt is calling and would blood work results from today

## 2022-08-08 DIAGNOSIS — Z7902 Long term (current) use of antithrombotics/antiplatelets: Secondary | ICD-10-CM | POA: Diagnosis not present

## 2022-08-08 DIAGNOSIS — I10 Essential (primary) hypertension: Secondary | ICD-10-CM | POA: Diagnosis not present

## 2022-08-08 DIAGNOSIS — K219 Gastro-esophageal reflux disease without esophagitis: Secondary | ICD-10-CM | POA: Diagnosis not present

## 2022-08-08 DIAGNOSIS — I252 Old myocardial infarction: Secondary | ICD-10-CM | POA: Diagnosis not present

## 2022-08-08 DIAGNOSIS — M1990 Unspecified osteoarthritis, unspecified site: Secondary | ICD-10-CM | POA: Diagnosis not present

## 2022-08-08 DIAGNOSIS — Z833 Family history of diabetes mellitus: Secondary | ICD-10-CM | POA: Diagnosis not present

## 2022-08-08 DIAGNOSIS — Z87891 Personal history of nicotine dependence: Secondary | ICD-10-CM | POA: Diagnosis not present

## 2022-08-08 DIAGNOSIS — Z7984 Long term (current) use of oral hypoglycemic drugs: Secondary | ICD-10-CM | POA: Diagnosis not present

## 2022-08-08 DIAGNOSIS — E785 Hyperlipidemia, unspecified: Secondary | ICD-10-CM | POA: Diagnosis not present

## 2022-08-08 DIAGNOSIS — I25119 Atherosclerotic heart disease of native coronary artery with unspecified angina pectoris: Secondary | ICD-10-CM | POA: Diagnosis not present

## 2022-08-08 DIAGNOSIS — Z880 Allergy status to penicillin: Secondary | ICD-10-CM | POA: Diagnosis not present

## 2022-08-08 DIAGNOSIS — E119 Type 2 diabetes mellitus without complications: Secondary | ICD-10-CM | POA: Diagnosis not present

## 2022-09-06 ENCOUNTER — Encounter: Payer: Self-pay | Admitting: Adult Health

## 2022-09-06 ENCOUNTER — Ambulatory Visit (INDEPENDENT_AMBULATORY_CARE_PROVIDER_SITE_OTHER): Payer: Medicare HMO | Admitting: Adult Health

## 2022-09-06 VITALS — BP 120/82 | HR 70 | Temp 97.9°F | Ht 66.5 in | Wt 187.0 lb

## 2022-09-06 DIAGNOSIS — R682 Dry mouth, unspecified: Secondary | ICD-10-CM

## 2022-09-06 DIAGNOSIS — G4730 Sleep apnea, unspecified: Secondary | ICD-10-CM
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
## 2022-09-28 ENCOUNTER — Other Ambulatory Visit: Payer: Self-pay | Admitting: Adult Health

## 2022-09-28 DIAGNOSIS — I10 Essential (primary) hypertension: Secondary | ICD-10-CM

## 2022-09-30 ENCOUNTER — Telehealth: Payer: Self-pay | Admitting: Adult Health

## 2022-09-30 ENCOUNTER — Encounter: Payer: Self-pay | Admitting: Family Medicine

## 2022-09-30 ENCOUNTER — Telehealth (INDEPENDENT_AMBULATORY_CARE_PROVIDER_SITE_OTHER): Payer: Medicare HMO | Admitting: Family Medicine

## 2022-09-30 DIAGNOSIS — U071 COVID-19: Secondary | ICD-10-CM

## 2022-09-30 DIAGNOSIS — R059 Cough, unspecified: Secondary | ICD-10-CM

## 2022-09-30 LAB — POC COVID19 BINAXNOW: SARS Coronavirus 2 Ag: POSITIVE — AB

## 2022-09-30 MED ORDER — MOLNUPIRAVIR EUA 200MG CAPSULE: 4.0000 | ORAL_CAPSULE | Freq: Two times a day (BID) | ORAL | 0 refills | Status: AC
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
## 2022-09-30 NOTE — Telephone Encounter (Signed)
Pt was just seen this morning via VV, by Dr. Caryl Never.  Pharmacy called to say the Rx for:  molnupiravir EUA (LAGEVRIO) 200 mg CAPS is not covered.   Pharmacy is asking for an alternate Rx (possibly Paxlovid)  Please advise.  [facility] - [place], [place] - [address] Phone: [phone]  Fax: [phone]

## 2022-09-30 NOTE — Progress Notes (Signed)
Patient unable to obtain vitals for the virtual visit.

## 2022-09-30 NOTE — Telephone Encounter (Signed)
Patient informed of the message below and voiced understanding  

## 2022-09-30 NOTE — Telephone Encounter (Signed)
Pt had vv with dr Caryl Never

## 2022-10-03 ENCOUNTER — Other Ambulatory Visit: Payer: Self-pay | Admitting: Gastroenterology

## 2022-10-03 DIAGNOSIS — K219 Gastro-esophageal reflux disease without esophagitis: Secondary | ICD-10-CM

## 2022-10-04 ENCOUNTER — Ambulatory Visit: Payer: Medicare HMO

## 2022-10-12 ENCOUNTER — Telehealth: Payer: Self-pay | Admitting: Adult Health

## 2022-10-12 NOTE — Telephone Encounter (Signed)
Prescription Request  10/12/2022  LOV: 09/06/2022  What is the name of the medication or equipment? nitroGLYCERIN nitroGLYCERIN (NITROSTAT) 0.4 MG SL tablet  Have you contacted your pharmacy to request a refill? Yes   Which pharmacy would you like this sent to?   [facility] - [address] - [place], [place] -  [address] [place] [address] Phone: [phone] Fax: [phone]   Patient notified that their request is being sent to the clinical staff for review and that they should receive a response within 2 business days.   Please advise at Mobile [phone] (mobile)

## 2022-10-13 NOTE — Telephone Encounter (Signed)
Patient notified that Rx need to come from Cardio. PT verbalized understanding.

## 2022-10-27 ENCOUNTER — Other Ambulatory Visit: Payer: Self-pay | Admitting: Gastroenterology

## 2022-10-27 ENCOUNTER — Other Ambulatory Visit: Payer: Self-pay | Admitting: Adult Health

## 2022-10-27 ENCOUNTER — Other Ambulatory Visit: Payer: Self-pay | Admitting: Cardiovascular Disease

## 2022-10-27 DIAGNOSIS — K219 Gastro-esophageal reflux disease without esophagitis: Secondary | ICD-10-CM

## 2022-11-01 ENCOUNTER — Ambulatory Visit (HOSPITAL_BASED_OUTPATIENT_CLINIC_OR_DEPARTMENT_OTHER): Payer: Medicare HMO | Admitting: Pulmonary Disease

## 2022-11-01 ENCOUNTER — Encounter (HOSPITAL_BASED_OUTPATIENT_CLINIC_OR_DEPARTMENT_OTHER): Payer: Self-pay | Admitting: Pulmonary Disease

## 2022-11-01 VITALS — BP 122/78 | HR 64 | Ht 66.5 in | Wt 191.2 lb

## 2022-11-01 DIAGNOSIS — G4730 Sleep apnea, unspecified: Secondary | ICD-10-CM | POA: Diagnosis not present
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
## 2022-11-01 NOTE — Patient Instructions (Signed)
x home sleep test

## 2022-11-02 ENCOUNTER — Other Ambulatory Visit: Payer: Self-pay | Admitting: Gastroenterology

## 2022-11-02 DIAGNOSIS — K219 Gastro-esophageal reflux disease without esophagitis: Secondary | ICD-10-CM

## 2022-11-18 ENCOUNTER — Encounter: Payer: Self-pay | Admitting: Internal Medicine

## 2022-11-18 ENCOUNTER — Ambulatory Visit: Payer: Medicare HMO | Attending: Internal Medicine | Admitting: Internal Medicine

## 2022-11-18 VITALS — BP 132/80 | HR 66 | Ht 67.0 in | Wt 193.0 lb

## 2022-11-18 DIAGNOSIS — I10 Essential (primary) hypertension: Secondary | ICD-10-CM | POA: Diagnosis not present

## 2022-11-18 DIAGNOSIS — N189 Chronic kidney disease, unspecified: Secondary | ICD-10-CM | POA: Diagnosis not present

## 2022-11-18 DIAGNOSIS — I25118 Atherosclerotic heart disease of native coronary artery with other forms of angina pectoris: Secondary | ICD-10-CM | POA: Diagnosis not present

## 2022-11-18 DIAGNOSIS — E782 Mixed hyperlipidemia: Secondary | ICD-10-CM

## 2022-11-18 DIAGNOSIS — M65332 Trigger finger, left middle finger: Secondary | ICD-10-CM

## 2022-11-18 MED ORDER — NITROGLYCERIN 0.4 MG SL SUBL: SUBLINGUAL_TABLET | SUBLINGUAL | 3 refills | Status: DC

## 2022-11-18 NOTE — Patient Instructions (Signed)
Medication Instructions:  Your physician recommends that you continue on your current medications as directed. Please refer to the Current Medication list given to you today.  *If you need a refill on your cardiac medications before your next appointment, please call your pharmacy*   Lab Work: BMP, FLP, Lpa   If you have labs (blood work) drawn today and your tests are completely normal, you will receive your results only by: MyChart Message (if you have MyChart) OR A paper copy in the mail If you have any lab test that is abnormal or we need to change your treatment, we will call you to review the results.   Testing/Procedures: NONE   Follow-Up: At [facility], you and your health needs are our priority.  As part of our continuing mission to provide you with exceptional heart care, we have created designated Provider Care Teams.  These Care Teams include your primary Cardiologist (physician) and Advanced Practice Providers (APPs -  Physician Assistants and Nurse Practitioners) who all work together to provide you with the care you need, when you need it.    Your next appointment:   1 year(s)  Provider:   Riley Lam, MD
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
## 2022-11-20 LAB — BASIC METABOLIC PANEL
BUN/Creatinine Ratio: 15 (ref 10–24)
BUN: 18 mg/dL (ref 8–27)
CO2: 29 mmol/L (ref 20–29)
Calcium: 10.2 mg/dL (ref 8.6–10.2)
Chloride: 105 mmol/L (ref 96–106)
Creatinine, Ser: 1.19 mg/dL (ref 0.76–1.27)
Glucose: 103 mg/dL — ABNORMAL HIGH (ref 70–99)
Potassium: 4.2 mmol/L (ref 3.5–5.2)
Sodium: 143 mmol/L (ref 134–144)
eGFR: 66 mL/min/{1.73_m2} (ref >59)

## 2022-11-20 LAB — LIPID PANEL
Chol/HDL Ratio: 3 ratio (ref 0.0–5.0)
Cholesterol, Total: 117 mg/dL (ref 100–199)
HDL: 39 mg/dL — ABNORMAL LOW (ref >39)
LDL Chol Calc (NIH): 63 mg/dL (ref 0–99)
Triglycerides: 72 mg/dL (ref 0–149)
VLDL Cholesterol Cal: 15 mg/dL (ref 5–40)

## 2022-11-20 LAB — LIPOPROTEIN A (LPA): Lipoprotein (a): 213.5 nmol/L — ABNORMAL HIGH (ref <75.0)

## 2022-11-21 ENCOUNTER — Telehealth: Payer: Self-pay | Admitting: Adult Health

## 2022-11-21 ENCOUNTER — Telehealth: Payer: Self-pay | Admitting: Internal Medicine

## 2022-11-21 DIAGNOSIS — E782 Mixed hyperlipidemia: Secondary | ICD-10-CM

## 2022-11-21 MED ORDER — EZETIMIBE 10 MG PO TABS: 10.0000 mg | ORAL_TABLET | Freq: Every day | ORAL | 3 refills | Status: DC

## 2022-11-21 MED ORDER — ATORVASTATIN CALCIUM 80 MG PO TABS: 80.0000 mg | ORAL_TABLET | Freq: Every day | ORAL | 3 refills | Status: DC

## 2022-11-21 NOTE — Telephone Encounter (Signed)
 Rx sent to requested Pharmacy.

## 2022-11-21 NOTE — Telephone Encounter (Signed)
Patient is calling again for update on results. Requesting call back.

## 2022-11-21 NOTE — Telephone Encounter (Signed)
Patient is requesting call back for results. Please advise.

## 2022-11-21 NOTE — Telephone Encounter (Signed)
The patient has been notified of the result and verbalized understanding.  All questions (if any) were answered. Arvid Right Darrian Goodwill, RN 11/21/2022 3:29 PM  Advised pt to have f/u FLP, ALT in mid to late Feb. 2025. Orders released.   Pt expresses understanding. Reset My chart password per pt request.

## 2022-11-21 NOTE — Telephone Encounter (Signed)
Prescription Request  11/21/2022  LOV: 09/06/2022  What is the name of the medication or equipment?     nitroGLYCERIN (NITROSTAT) 0.4 MG SL tablet ,    atorvastatin (LIPITOR) 80 MG tablet  Have you contacted your pharmacy to request a refill? No   Which pharmacy would you like this sent to?  [facility] - [place], [place] - [address] Phone: [phone]  Fax: [phone]       Patient notified that their request is being sent to the clinical staff for review and that they should receive a response within 2 business days.   Please advise at Mobile [phone] (mobile)

## 2022-11-21 NOTE — Telephone Encounter (Signed)
-----   Message from Christell Constant sent at 11/20/2022  9:56 PM EST ----- Results: LdD of 63 but with elevated Lp(A) Plan: Zetia 10 mg and labs in three months  Christell Constant, MD

## 2022-11-21 NOTE — Telephone Encounter (Signed)
*  STAT* If patient is at the pharmacy, call can be transferred to refill team.   1. Which medications need to be refilled? (please list name of each medication and dose if known) atorvastatin (LIPITOR) 80 MG tablet   2. Which pharmacy/location (including street and city if local pharmacy) is medication to be sent to? [facility] - [address] - [place], [place] - [address]   3. Do they need a 30 day or 90 day supply? 90

## 2022-11-22 NOTE — Telephone Encounter (Signed)
This  needs to come from Cardiology. Pt aware of update no further action needed.

## 2022-11-25 ENCOUNTER — Other Ambulatory Visit: Payer: Self-pay | Admitting: Cardiovascular Disease

## 2022-12-17 ENCOUNTER — Telehealth: Payer: Self-pay | Admitting: Physician Assistant

## 2022-12-17 NOTE — Telephone Encounter (Signed)
He called stating he is having a headache with zetia. This has required tylenol. He paused zetia one day and did not have the headache. He states the headache is mild and lasts 10-15 minutes.  Been taking Tylenol daily.  I did caution against taking Tylenol daily on Zetia.  I suggested taking Zetia at night.  He states his pills arrive prepacked from the pharmacy.  I offer alternative treatment options such as PCSK9 inhibitor.  Does not sound like he is interested in these at this time.  He will continue taking Zetia for now.

## 2022-12-19 ENCOUNTER — Telehealth: Payer: Self-pay | Admitting: Adult Health

## 2022-12-19 ENCOUNTER — Telehealth: Payer: Self-pay | Admitting: Internal Medicine

## 2022-12-19 NOTE — Telephone Encounter (Signed)
Pt c/o medication issue:  1. Name of Medication:   ezetimibe (ZETIA) 10 MG tablet   2. How are you currently taking this medication (dosage and times per day)?   As prescribed  3. Are you having a reaction (difficulty breathing--STAT)?   4. What is your medication issue?   Patient stated he started taking this medication last month and has noticed that he has been getting a headache after taking this medication.

## 2022-12-19 NOTE — Telephone Encounter (Signed)
Called pt reports HA since starting Zetia 10 mg.   Reports it feels like a dull HA occurs after takes medication.  Reports drinks very little fluids daily.  Advised to increase fluid intake some and stop Zetia for 2 weeks and call back in to report if HA improves off the medication.  Pt expresses understanding.

## 2022-12-19 NOTE — Telephone Encounter (Signed)
Pt called to request a Colonoscopy Referral. Pt has already been scheduled for a CPE on 07/28/23.

## 2022-12-20 NOTE — Telephone Encounter (Signed)
Pt should not need any referrals. Pt can call number below to schedule. Will call pt to advise of messge.    [address], [place], [place] [address] Hours:  Open ? Closes 4?PM Phone: [phone]

## 2022-12-20 NOTE — Telephone Encounter (Signed)
Tried to call pt to advise of message below but no answer.

## 2022-12-29 ENCOUNTER — Other Ambulatory Visit: Payer: Self-pay

## 2022-12-29 ENCOUNTER — Emergency Department (HOSPITAL_COMMUNITY)
Admission: EM | Admit: 2022-12-29 | Discharge: 2022-12-30 | Disposition: A | Discharge status: Home / Self Care | Payer: Medicare HMO | Attending: Emergency Medicine | Admitting: Emergency Medicine

## 2022-12-29 ENCOUNTER — Encounter (HOSPITAL_COMMUNITY): Payer: Self-pay

## 2022-12-29 DIAGNOSIS — Z7984 Long term (current) use of oral hypoglycemic drugs: Secondary | ICD-10-CM | POA: Insufficient documentation

## 2022-12-29 DIAGNOSIS — K5730 Diverticulosis of large intestine without perforation or abscess without bleeding: Secondary | ICD-10-CM | POA: Diagnosis not present

## 2022-12-29 DIAGNOSIS — E119 Type 2 diabetes mellitus without complications: Secondary | ICD-10-CM | POA: Diagnosis not present

## 2022-12-29 DIAGNOSIS — Z79899 Other long term (current) drug therapy: Secondary | ICD-10-CM | POA: Insufficient documentation

## 2022-12-29 DIAGNOSIS — I10 Essential (primary) hypertension: Secondary | ICD-10-CM | POA: Insufficient documentation

## 2022-12-29 DIAGNOSIS — I2510 Atherosclerotic heart disease of native coronary artery without angina pectoris: Secondary | ICD-10-CM | POA: Diagnosis not present

## 2022-12-29 DIAGNOSIS — N50819 Testicular pain, unspecified: Secondary | ICD-10-CM | POA: Diagnosis not present

## 2022-12-29 DIAGNOSIS — R109 Unspecified abdominal pain: Secondary | ICD-10-CM | POA: Diagnosis not present

## 2022-12-29 DIAGNOSIS — Z7982 Long term (current) use of aspirin: Secondary | ICD-10-CM | POA: Insufficient documentation

## 2022-12-29 DIAGNOSIS — N451 Epididymitis: Secondary | ICD-10-CM | POA: Insufficient documentation

## 2022-12-29 LAB — CBC
HCT: 40.2 % (ref 39.0–52.0)
Hemoglobin: 14.2 g/dL (ref 13.0–17.0)
MCH: 28.9 pg (ref 26.0–34.0)
MCHC: 35.3 g/dL (ref 30.0–36.0)
MCV: 81.9 fL (ref 80.0–100.0)
Platelets: 206 10*3/uL (ref 150–400)
RBC: 4.91 MIL/uL (ref 4.22–5.81)
RDW: 13.3 % (ref 11.5–15.5)
WBC: 6.5 10*3/uL (ref 4.0–10.5)
nRBC: 0 % (ref 0.0–0.2)

## 2022-12-29 LAB — COMPREHENSIVE METABOLIC PANEL
ALT: 22 U/L (ref 0–44)
AST: 19 U/L (ref 15–41)
Albumin: 3.8 g/dL (ref 3.5–5.0)
Alkaline Phosphatase: 101 U/L (ref 38–126)
Anion gap: 6 (ref 5–15)
BUN: 10 mg/dL (ref 8–23)
CO2: 27 mmol/L (ref 22–32)
Calcium: 9.7 mg/dL (ref 8.9–10.3)
Chloride: 104 mmol/L (ref 98–111)
Creatinine, Ser: 1.16 mg/dL (ref 0.61–1.24)
GFR, Estimated: >60 mL/min (ref >60)
Glucose, Bld: 79 mg/dL (ref 70–99)
Potassium: 3.5 mmol/L (ref 3.5–5.1)
Sodium: 137 mmol/L (ref 135–145)
Total Bilirubin: 0.5 mg/dL (ref <1.2)
Total Protein: 6.6 g/dL (ref 6.5–8.1)

## 2022-12-29 LAB — URINALYSIS, ROUTINE W REFLEX MICROSCOPIC
Bacteria, UA: NONE SEEN
Bilirubin Urine: NEGATIVE
Glucose, UA: NEGATIVE mg/dL
Hgb urine dipstick: NEGATIVE
Ketones, ur: NEGATIVE mg/dL
Leukocytes,Ua: NEGATIVE
Nitrite: NEGATIVE
Protein, ur: NEGATIVE mg/dL
Specific Gravity, Urine: 1.015 (ref 1.005–1.030)
pH: 6 (ref 5.0–8.0)

## 2022-12-29 LAB — LIPASE, BLOOD: Lipase: 28 U/L (ref 11–51)

## 2022-12-29 NOTE — ED Triage Notes (Signed)
Pt having pain in the left flank that started tonight around 8 pm. York Spaniel it starts in the lower back and comes around to the left side of the groin. Denies any blood in urine. But it came on all of a sudden.

## 2022-12-30 ENCOUNTER — Emergency Department (HOSPITAL_COMMUNITY): Payer: Medicare HMO

## 2022-12-30 DIAGNOSIS — N50819 Testicular pain, unspecified: Secondary | ICD-10-CM | POA: Diagnosis not present

## 2022-12-30 DIAGNOSIS — R109 Unspecified abdominal pain: Secondary | ICD-10-CM | POA: Diagnosis not present

## 2022-12-30 DIAGNOSIS — K5730 Diverticulosis of large intestine without perforation or abscess without bleeding: Secondary | ICD-10-CM | POA: Diagnosis not present

## 2022-12-30 MED ORDER — LEVOFLOXACIN 500 MG PO TABS
500.0000 mg | ORAL_TABLET | Freq: Once | ORAL | Status: AC
Start: 2022-12-30 — End: 2022-12-30
  Administered 2022-12-30: 500 mg via ORAL
  Filled 2022-12-30: qty 1

## 2022-12-30 MED ORDER — LEVOFLOXACIN 500 MG PO TABS: 500.0000 mg | ORAL_TABLET | Freq: Every day | ORAL | 0 refills | Status: AC

## 2022-12-30 NOTE — ED Provider Notes (Signed)
[facility] EMERGENCY DEPARTMENT AT [facility] Provider Note   CSN: [id number] Arrival date & time: 12/29/22  2240     History  Chief Complaint  Patient presents with   Abdominal Pain    Dylan Lane is a 72 y.o. male.  Patient is a 72 year old male with a past medical history of hypertension, diabetes and CAD presenting to the emergency department with left-sided flank pain.  Patient states last night after dinner he started to develop severe pain in his left flank that radiated across his left lower abdomen and down to his left scrotum.  He states that the severe pain lasted for several hours and it has been coming and going since.  He states is very sensitive to touch.  He denies any nausea or vomiting, fevers or chills, dysuria or hematuria, diarrhea or constipation, abnormal penile discharge or any concern about STI.  The history is provided by the patient.  Abdominal Pain      Home Medications Prior to Admission medications   Medication Sig Start Date End Date Taking? Authorizing Provider  alfuzosin (UROXATRAL) 10 MG 24 hr tablet Take 10 mg by mouth daily. 04/19/20  Yes [provider]  amLODipine (NORVASC) 10 MG tablet TAKE 1 TABLET BY MOUTH ONCE DAILY 09/29/22  Yes Nafziger, Kandee Keen, NP  aspirin 81 MG EC tablet Take 1 tablet (81 mg total) by mouth daily. 12/20/13  Yes Dow Adolph, MD  ezetimibe (ZETIA) 10 MG tablet Take 1 tablet (10 mg total) by mouth daily. 11/21/22  Yes Chandrasekhar, Mahesh A, MD  levofloxacin (LEVAQUIN) 500 MG tablet Take 1 tablet (500 mg total) by mouth daily for 10 days. 12/30/22 01/09/23 Yes Kingsley, Tenika Keeran K, DO  metFORMIN (GLUCOPHAGE-XR) 500 MG 24 hr tablet TAKE 1 TABLET BY MOUTH EVERY MORNING 10/28/22  Yes Nafziger, Kandee Keen, NP  metoprolol succinate (TOPROL-XL) 25 MG 24 hr tablet TAKE 1 TABLET BY MOUTH ONCE DAILY 11/28/22  Yes Chandrasekhar, Mahesh A, MD  pantoprazole (PROTONIX) 40 MG tablet Take 1 tablet (40 mg total) by  mouth 2 (two) times daily. Needs office follow up 10/28/22  Yes Zehr, Princella Pellegrini, PA-C  atorvastatin (LIPITOR) 80 MG tablet Take 1 tablet (80 mg total) by mouth daily. 11/21/22   Chandrasekhar, [place] Dragon A, MD  clopidogrel (PLAVIX) 75 MG tablet TAKE 1 TABLET BY MOUTH ONCE DAILY 11/28/22   Chandrasekhar, Mahesh A, MD  nitroGLYCERIN (NITROSTAT) 0.4 MG SL tablet DISSOLVE 1 TABLET UNDER THE TONGUE EVERY 5 MINUTES AS NEEDED FOR CHEST PAIN. DO NOT EXCEED A TOTAL OF 3 DOSES IN 15 MINUTES. 11/18/22   Christell Constant, MD      Allergies    Amoxicillin    Review of Systems   Review of Systems  Gastrointestinal:  Positive for abdominal pain.    Physical Exam Updated Vital Signs BP (!) 141/78 (BP Location: Right Arm)   Pulse (!) 56   Temp (!) 97.5 F (36.4 C) (Oral)   Resp 18   Ht 5\' 7"  (1.702 m)   Wt 87.1 kg   SpO2 100%   BMI 30.07 kg/m  Physical Exam Vitals and nursing note reviewed. Exam conducted with a chaperone present.  Constitutional:      General: He is not in acute distress.    Appearance: He is well-developed.  HENT:     Head: Normocephalic and atraumatic.     Mouth/Throat:     Mouth: Mucous membranes are moist.  Eyes:     Extraocular Movements: Extraocular movements  intact.  Cardiovascular:     Rate and Rhythm: Normal rate and regular rhythm.     Heart sounds: Normal heart sounds.  Pulmonary:     Effort: Pulmonary effort is normal.     Breath sounds: Normal breath sounds.  Abdominal:     General: Abdomen is flat.     Palpations: Abdomen is soft.     Tenderness: There is no abdominal tenderness. There is no right CVA tenderness or left CVA tenderness.     Hernia: No hernia is present.  Genitourinary:    Penis: Normal.      Testes:        Right: Mass, tenderness or swelling not present.        Left: Tenderness present. Mass or swelling not present.  Skin:    General: Skin is warm and dry.  Neurological:     General: No focal deficit present.     Mental  Status: He is alert and oriented to person, place, and time.  Psychiatric:        Mood and Affect: Mood normal.        Behavior: Behavior normal.     ED Results / Procedures / Treatments   Labs (all labs ordered are listed, but only abnormal results are displayed) Labs Reviewed  LIPASE, BLOOD  COMPREHENSIVE METABOLIC PANEL  CBC  URINALYSIS, ROUTINE W REFLEX MICROSCOPIC    EKG None  Radiology US SCROTUM W/DOPPLER Result Date: 12/30/2022 CLINICAL DATA:  L testicle pain. EXAM: SCROTAL ULTRASOUND DOPPLER ULTRASOUND OF THE TESTICLES TECHNIQUE: Complete ultrasound examination of the testicles, epididymis, and other scrotal structures was performed. Color and spectral Doppler ultrasound were also utilized to evaluate blood flow to the testicles. COMPARISON:  CT scan abdomen and pelvis from earlier the same day. FINDINGS: Right testicle Measurements: 1.7 x 1.9 x 2.6 cm. No mass or microlithiasis visualized. Left testicle Measurements: 1.8 x 2.0 x 2.3 cm. No mass or microlithiasis visualized. Right epididymis:  Normal in size and appearance. Left epididymis: Normal in size and appearance. However, there is asymmetrically increased vascularity, which can be seen with epididymitis. Hydrocele:  None visualized. Varicocele:  None visualized. Pulsed Doppler interrogation of both testes demonstrates normal low resistance arterial and venous waveforms bilaterally. IMPRESSION: *Mild asymmetrically increased vascularity in the left epididymis without significant morphological changes. In appropriate clinical setting, findings can be seen with early epididymitis. *Otherwise unremarkable exam.  No imaging evidence of torsion. Electronically Signed   By: Jules Schick M.D.   On: 12/30/2022 12:02   CT Renal Stone Study Result Date: 12/30/2022 CLINICAL DATA:  72 year old male with abdomen and left flank pain since 2000 hours. Radiates from the low back to the left groin. Abrupt onset. EXAM: CT ABDOMEN AND  PELVIS WITHOUT CONTRAST TECHNIQUE: Multidetector CT imaging of the abdomen and pelvis was performed following the standard protocol without IV contrast. RADIATION DOSE REDUCTION: This exam was performed according to the departmental dose-optimization program which includes automated exposure control, adjustment of the mA and/or kV according to patient size and/or use of iterative reconstruction technique. COMPARISON:  CT Abdomen and Pelvis 12/06/2015. FINDINGS: Lower chest: Negative.  No pericardial or pleural effusion. Hepatobiliary: Negative noncontrast liver and gallbladder. Pancreas: Negative. Spleen: Negative. Adrenals/Urinary Tract: Normal adrenal glands. The kidneys appear symmetric, nonobstructed. No nephrolithiasis identified. Ureters appear symmetrically decompressed. No ureteral inflammation. Normal course of both ureters. Unremarkable bladder aside from adjacent prostatomegaly. Pelvic vascular calcifications. Stomach/Bowel: Mild diverticulosis of the descending colon. Mild large bowel retained stool. Occasional right  colon diverticula. Normal appendix on series 3, image 49. No large bowel inflammation. Negative terminal ileum. No dilated small bowel. Relatively decompressed stomach and duodenum. No free air, free fluid, or mesenteric inflammation identified. Vascular/Lymphatic: Aortoiliac calcified atherosclerosis. Normal caliber abdominal aorta. Vascular patency is not evaluated in the absence of IV contrast. No lymphadenopathy identified. Reproductive: Prostatomegaly, with impression on the base of the bladder similar to the 2017 CT (sagittal image 103). Other: No pelvis free fluid. 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 Widespread lumbar facet arthropathy. No acute osseous abnormality identified. IMPRESSION: 1. No urinary calculus or obstructive uropathy. Prostatomegaly, with lobulated  impression on the base of the urinary bladder similar to a 2017 CT. 2. No acute or inflammatory process identified in the noncontrast abdomen or pelvis. Normal appendix. 3.  Aortic Atherosclerosis (ICD10-I70.0). Electronically Signed   By: Odessa Fleming M.D.   On: 12/30/2022 04:52    Procedures Procedures    Medications Ordered in ED Medications  levofloxacin (LEVAQUIN) tablet 500 mg (has no administration in time range)    ED Course/ Medical Decision Making/ A&P Clinical Course as of 12/30/22 1227  Fri Dec 30, 2022  1206 Scrotal ultrasound with findings of possible early epididymitis. This correlates with location of patient's pain and he will be given antibiotics and recommended outpatient follow up.  [VK]    Clinical Course User Index [VK] Rexford Maus, DO                                 Medical Decision Making This patient presents to the ED with chief complaint(s) of left flank pain with pertinent past medical history of hypertension, CAD, diabetes which further complicates the presenting complaint. The complaint involves an extensive differential diagnosis and also carries with it a high risk of complications and morbidity.    The differential diagnosis includes pyelonephritis, nephrolithiasis, no palpable hernia, testicular torsion, epididymitis, orchitis, muscle strain or spasm, diverticulitis or other intra-abdominal infection  Additional history obtained: Additional history obtained from N/A Records reviewed outpatient cardiology records  ED Course and Reassessment: On patient's arrival he was hemodynamically stable in no acute distress.  Was initially evaluated in triage and had labs, urine and CT stone search performed.  Patient's labs and urine were within normal range.  CT showed no evidence of kidney stone in no acute disease to explain his pain.  On my evaluation the patient has point tenderness to his left testicle.  Will have scrotal ultrasound performed.  Patient  declined any pain medication at this time.  Independent labs interpretation:  The following labs were independently interpreted: within normal range  Independent visualization of imaging: - I independently visualized the following imaging with scope of interpretation limited to determining acute life threatening conditions related to emergency care: CT stone search, scrotal US, which revealed findings of early epididymitis  Consultation: - Consulted or discussed management/test interpretation w/ external professional: N/A  Consideration for admission or further workup: Patient has no emergent conditions requiring admission or further work-up at this time and is stable for discharge home with primary care and urology follow-up  Social Determinants of health: N/A    Amount and/or Complexity of Data Reviewed Labs: ordered. Radiology: ordered.  Risk Prescription drug management.          Final Clinical Impression(s) / ED Diagnoses Final diagnoses:  Epididymitis    Rx /  DC Orders ED Discharge Orders          Ordered    levofloxacin (LEVAQUIN) 500 MG tablet  Daily        12/30/22 1226              Rexford Maus, [place] 12/30/22 1227

## 2022-12-30 NOTE — ED Notes (Signed)
Pt to US.

## 2022-12-30 NOTE — Discharge Instructions (Signed)
You were seen in the emergency department for your left-sided abdominal and protal pain.  Your lab work, urine and CAT scan looked okay but your ultrasound did show signs of epididymitis which is inflammation of part of your testicle caused by an infection.  I have given you prescription of antibiotics and you should take this as prescribed.  You can follow-up with urology to have your symptoms rechecked.  You should return to the emergency department if you have fevers despite the antibiotics, increased pain, redness or swelling of your scrotum or any other new or concerning symptoms.

## 2023-01-06 NOTE — Telephone Encounter (Signed)
 Called pt to f/u.  Pt reports he never stopped taking zetia but increased water intake and HA have went away.  Pt has no other concerns at this time.

## 2023-01-10 ENCOUNTER — Telehealth: Payer: Self-pay

## 2023-01-10 NOTE — Progress Notes (Signed)
 Transition Care Management Unsuccessful Follow-up Telephone Call  Date of discharge and from where:  [facility] 12/27  Attempts:  1st Attempt  Reason for unsuccessful TCM follow-up call:  No answer/busy   [name] [place]  [facility], [facility] Guide, Phone: [phone] Website: [email or web address]

## 2023-01-10 NOTE — Progress Notes (Signed)
 Transition Care Management Follow-up Telephone Call Date of discharge and from where: [facility] [address] 12/27 How have you been since you were released from the hospital? Patient is doing ok and taking all medications. Patient has  not followed up with any providers Any questions or concerns? No  Items Reviewed: Did the pt receive and understand the discharge instructions provided? Yes  Medications obtained and verified? Yes  Other? No  Any new allergies since your discharge? No  Dietary orders reviewed? No Do you have support at home? Yes     Follow up appointments reviewed:  PCP Hospital f/u appt confirmed? No  Scheduled to see  on  @ . Specialist Hospital f/u appt confirmed? No  Scheduled to see  on  @ . Are transportation arrangements needed? No  If their condition worsens, is the pt aware to call PCP or go to the Emergency Dept.? Yes Was the patient provided with contact information for the PCP's office or ED? Yes Was to pt encouraged to call back with questions or concerns? Yes

## 2023-01-27 ENCOUNTER — Other Ambulatory Visit: Payer: Self-pay | Admitting: Gastroenterology

## 2023-01-27 DIAGNOSIS — K219 Gastro-esophageal reflux disease without esophagitis: Secondary | ICD-10-CM

## 2023-02-09 ENCOUNTER — Ambulatory Visit: Payer: Self-pay | Admitting: Adult Health

## 2023-02-09 NOTE — Telephone Encounter (Signed)
 Patient called back to see if he can be seen today. Advised patient he currently has the first available appointment in the office tomorrow at 0930. Patient verbalized understanding and stated he would keep that appointment.

## 2023-02-09 NOTE — Telephone Encounter (Signed)
 FYI

## 2023-02-09 NOTE — Telephone Encounter (Signed)
  Chief Complaint: cough Symptoms: cough with dark sputum Frequency: started two days ago Pertinent Negatives: Patient denies fever, pain, sob Disposition: [] ED /[] Urgent Care (no appt availability in office) / [x] Appointment(In office/virtual)/ []  [place] [facility]/ [] Home Care/ [] Refused Recommended Disposition /[] [place] [facility]/ []  Follow-up with PCP Additional Notes: states cough started two days ago.  Apt scheduled for tomorrow.  Care advice given, denies questions.  Instructed to go to the er if becomes worse.   Reason for Disposition  Cough has been present for > 3 weeks  Answer Assessment - Initial Assessment Questions 1. ONSET: When did the cough begin?      Two days ago 2. SEVERITY: How bad is the cough today?      mild 3. SPUTUM: Describe the color of your sputum (none, dry cough; clear, white, yellow, green)     Dark 4. HEMOPTYSIS: Are you coughing up any blood? If so ask: How much? (flecks, streaks, tablespoons, etc.)     denies 5. DIFFICULTY BREATHING: Are you having difficulty breathing? If Yes, ask: How bad is it? (e.g., mild, moderate, severe)    - MILD: No SOB at rest, mild SOB with walking, speaks normally in sentences, can lie down, no retractions, pulse < 100.    - MODERATE: SOB at rest, SOB with minimal exertion and prefers to sit, cannot lie down flat, speaks in phrases, mild retractions, audible wheezing, pulse 100-120.    - SEVERE: Very SOB at rest, speaks in single words, struggling to breathe, sitting hunched forward, retractions, pulse > 120      denies 6. FEVER: Do you have a fever? If Yes, ask: What is your temperature, how was it measured, and when did it start?     denies 7. CARDIAC HISTORY: Do you have any history of heart disease? (e.g., heart attack, congestive heart failure)      Heart attack last year 8. LUNG HISTORY: Do you have any history of lung disease?  (e.g., pulmonary embolus, asthma, emphysema)      denies 9. PE RISK FACTORS: Do you have a history of blood clots? (or: recent major surgery, recent prolonged travel, bedridden)     denies 10. OTHER SYMPTOMS: Do you have any other symptoms? (e.g., runny nose, wheezing, chest pain)       denies 11. PREGNANCY: Is there any chance you are pregnant? When was your last menstrual period?       na 12. TRAVEL: Have you traveled out of the country in the last month? (e.g., travel history, exposures)       Denies.  Protocols used: Cough - Acute Productive-A-AH

## 2023-02-09 NOTE — Telephone Encounter (Signed)
 Copied from CRM [id number]. Topic: Clinical - Medical Advice >> Feb 09, 2023  2:26 PM Joanell B wrote: Reason for CRM: Pt stated that he has a cough, and is coughing up clear flem and would like to make sure that he doesn't have pneumonia, and would prefer to speak with his PCP if possible. He stated that he wanted to know what symptoms he should be looking out for to know if he has pneumonia.  Chief Complaint: Cough Symptoms: Cough, runny nose Frequency: 2 days Pertinent Negatives: Patient denies fever Disposition: [] ED /[] Urgent Care (no appt availability in office) / [] Appointment(In office/virtual)/ []  [place] [facility]/ [x] Home Care/ [] Refused Recommended Disposition /[] [place] [facility]/ []  Follow-up with PCP Additional Notes: Patient called in to report a cough that has been ongoing for 2 days. Patient stated that the cough produces clear phlegm. Patient also reported a runny nose. Patient denied difficulty breathing, chest pain, wheezing and fever. Patient able to speak in clear and complete sentences while on the phone with this RN. Patient stated that he is taking Mucinex  that is safe for high BP. This RN advised home care at this time. This RN educated on when it is appropriate to seek additional care. This RN advised patient to call back if symptoms worsen or if he has additional questions. Patient complied.   Reason for Disposition  Cough with cold symptoms (e.g., runny nose, postnasal drip, throat clearing)  Answer Assessment - Initial Assessment Questions 1. ONSET: When did the cough begin?      2 days 2. SEVERITY: How bad is the cough today?      States he has a coughing spell every once in a while 3. SPUTUM: Describe the color of your sputum (none, dry cough; clear, white, yellow, green)     Clear 5. DIFFICULTY BREATHING: Are you having difficulty breathing? If Yes, ask: How bad is it? (e.g., mild, moderate, severe)    - MILD: No SOB at rest, mild SOB with  walking, speaks normally in sentences, can lie down, no retractions, pulse < 100.    - MODERATE: SOB at rest, SOB with minimal exertion and prefers to sit, cannot lie down flat, speaks in phrases, mild retractions, audible wheezing, pulse 100-120.    - SEVERE: Very SOB at rest, speaks in single words, struggling to breathe, sitting hunched forward, retractions, pulse > 120      Denies, states he just walked to and from the store with no issues 6. FEVER: Do you have a fever? If Yes, ask: What is your temperature, how was it measured, and when did it start?     Denies 7. CARDIAC HISTORY: Do you have any history of heart disease? (e.g., heart attack, congestive heart failure)      States he had a heart attack and a stent placed within the last 2 years, high BP, high cholesterol  8. LUNG HISTORY: Do you have any history of lung disease?  (e.g., pulmonary embolus, asthma, emphysema)     Denies 10. OTHER SYMPTOMS: Do you have any other symptoms? (e.g., runny nose, wheezing, chest pain)       Runny nose, denies wheezing, denies chest pain, denies difficulty breathing  Protocols used: Cough - Acute Productive-A-AH

## 2023-02-10 ENCOUNTER — Ambulatory Visit: Payer: Self-pay | Admitting: Adult Health

## 2023-02-10 NOTE — Telephone Encounter (Signed)
 Chief Complaint: cough, runny nose  Symptoms: productive cough, clear mucus. Coughing spells at times. Runny nose  taking mucinex  .  Frequency: couple of day ago  Pertinent Negatives: Patient denies chest pain  no difficulty breathing no fever reported. No headaches, no sore throat. No body aches reported  Disposition: [] ED /[] Urgent Care (no appt availability in office) / [] Appointment(In office/virtual)/ []  [place] [facility]/ [x] Home Care/ [] Refused Recommended Disposition /[] [place] [facility]/ []  Follow-up with PCP Additional Notes:   Patient concerned he may have covid and is going to take at home covid test today  and call back if positive. Patient concerned sx will get worse and he has not had flu or pneumonia  vaccinations.  Recommended to increase water intake and or gatorade prn. Humidified air in home. Warm liquids for cough and use hard candy/ cough drops prn. OTC antihistamine . Patient already taking mucinex .  Please advise if OV needed.  3rd call from patient regarding same sx. See other NT encounters.        Copied from CRM [id number]. Topic: Appointments - Appointment Scheduling >> Feb 10, 2023  9:24 AM Tonda B wrote: Patient/patient representative is calling to schedule an appointment. Refer to attachments for appointment information. Patient wants to talk to nurse about medical questions Reason for Disposition  Cough with cold symptoms (e.g., runny nose, postnasal drip, throat clearing)  Answer Assessment - Initial Assessment Questions 1. ONSET: When did the cough begin?      Couple of days  2. SEVERITY: How bad is the cough today?      Coughing spells  3. SPUTUM: Describe the color of your sputum (none, dry cough; clear, white, yellow, green)     clear 4. HEMOPTYSIS: Are you coughing up any blood? If so ask: How much? (flecks, streaks, tablespoons, etc.)     Na  5. DIFFICULTY BREATHING: Are you having difficulty breathing? If Yes, ask: How bad is  it? (e.g., mild, moderate, severe)    - MILD: No SOB at rest, mild SOB with walking, speaks normally in sentences, can lie down, no retractions, pulse < 100.    - MODERATE: SOB at rest, SOB with minimal exertion and prefers to sit, cannot lie down flat, speaks in phrases, mild retractions, audible wheezing, pulse 100-120.    - SEVERE: Very SOB at rest, speaks in single words, struggling to breathe, sitting hunched forward, retractions, pulse > 120      Denies  6. FEVER: Do you have a fever? If Yes, ask: What is your temperature, how was it measured, and when did it start?     Na  7. CARDIAC HISTORY: Do you have any history of heart disease? (e.g., heart attack, congestive heart failure)      Hx heart attack  8. LUNG HISTORY: Do you have any history of lung disease?  (e.g., pulmonary embolus, asthma, emphysema)     na 9. PE RISK FACTORS: Do you have a history of blood clots? (or: recent major surgery, recent prolonged travel, bedridden)     na 10. OTHER SYMPTOMS: Do you have any other symptoms? (e.g., runny nose, wheezing, chest pain)       Coughing spells, productive clear mucus, runny nose  11. PREGNANCY: Is there any chance you are pregnant? When was your last menstrual period?       na 12. TRAVEL: Have you traveled out of the country in the last month? (e.g., travel history, exposures)       na  Protocols used: Cough - Acute Productive-A-AH

## 2023-02-10 NOTE — Telephone Encounter (Signed)
 Tried to call pt. It stated that pt had an appt. Today but I only see one for 02/14/2023. Tried to call pt to advised urgent care but no answer. Will try again shortly.

## 2023-02-14 ENCOUNTER — Ambulatory Visit: Payer: Medicare HMO

## 2023-02-14 ENCOUNTER — Ambulatory Visit (INDEPENDENT_AMBULATORY_CARE_PROVIDER_SITE_OTHER): Payer: Medicare HMO | Admitting: Adult Health

## 2023-02-14 VITALS — BP 110/80 | HR 81 | Temp 98.1°F | Ht 67.0 in | Wt 190.0 lb

## 2023-02-14 DIAGNOSIS — R062 Wheezing: Secondary | ICD-10-CM | POA: Diagnosis not present

## 2023-02-14 DIAGNOSIS — R059 Cough, unspecified: Secondary | ICD-10-CM | POA: Diagnosis not present

## 2023-02-14 DIAGNOSIS — J988 Other specified respiratory disorders: Secondary | ICD-10-CM | POA: Diagnosis not present

## 2023-02-14 MED ORDER — BENZONATATE 200 MG PO CAPS: 200.0000 mg | ORAL_CAPSULE | Freq: Three times a day (TID) | ORAL | 1 refills | Status: DC | PRN

## 2023-02-14 MED ORDER — PREDNISONE 10 MG PO TABS
ORAL_TABLET | ORAL | 0 refills | Status: DC
Start: 2023-02-14 — End: 2023-03-07
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
## 2023-02-14 NOTE — Telephone Encounter (Signed)
Pt is coming in today

## 2023-02-14 NOTE — Patient Instructions (Signed)
I think you have a respiratory infection but not pneumonia.   I have sent in steroids and tessalon.   We will follow up with you once the chest xray is back.

## 2023-02-23 ENCOUNTER — Ambulatory Visit: Payer: Medicare HMO | Admitting: Adult Health

## 2023-02-24 ENCOUNTER — Ambulatory Visit: Payer: Medicare HMO | Admitting: Adult Health

## 2023-02-24 NOTE — Progress Notes (Deleted)
   Subjective:    Patient ID: Dylan Lane, male    DOB: 1950/02/15, 73 y.o.   MRN: [id number]  HPI 73 year old male who  has a past medical history of Arthritis, CAD (coronary artery disease), native coronary artery, Colon polyps, GERD (gastroesophageal reflux disease) (2017), Hyperlipidemia, Hypertension, Myocardial infarction (HCC), Pre-diabetes, Rectal bleeding, and Scoliosis.  He presents to the office today for follow-up.  He was seen roughly a week and a half ago with a nonproductive cough with associated ear congestion, headaches, and wheezing.  He denied chest pain, chills, ear pain, nasal congestion, sore throat, shortness of breath, or weight loss at this time.  Symptoms were aggravated by laying down.  He has been trying over-the-counter medications with no relief  On exam wheezing was present but no rales or rhonchi  An x-ray was done but I still do not have a formal report on this.  He was prescribed prednisone taper and Tessalon Perles.   Review of Systems     Objective:   Physical Exam        Assessment & Plan:

## 2023-03-07 ENCOUNTER — Encounter: Payer: Self-pay | Admitting: Nurse Practitioner

## 2023-03-07 ENCOUNTER — Telehealth: Payer: Self-pay

## 2023-03-07 ENCOUNTER — Ambulatory Visit: Payer: Medicare HMO | Admitting: Nurse Practitioner

## 2023-03-07 VITALS — BP 130/78 | HR 66 | Ht 67.0 in | Wt 193.4 lb

## 2023-03-07 DIAGNOSIS — Z860100 Personal history of colon polyps, unspecified: Secondary | ICD-10-CM

## 2023-03-07 DIAGNOSIS — Z860102 Personal history of hyperplastic colon polyps: Secondary | ICD-10-CM | POA: Diagnosis not present

## 2023-03-07 DIAGNOSIS — I25118 Atherosclerotic heart disease of native coronary artery with other forms of angina pectoris: Secondary | ICD-10-CM

## 2023-03-07 DIAGNOSIS — K219 Gastro-esophageal reflux disease without esophagitis: Secondary | ICD-10-CM

## 2023-03-07 MED ORDER — SUFLAVE 178.7 G PO SOLR: 1.0000 | Freq: Once | ORAL | 0 refills | Status: AC

## 2023-03-07 NOTE — Telephone Encounter (Signed)
   Name: Dylan Lane  DOB: 1950-05-23  MRN: [id number]  Primary Cardiologist: Lesleigh Noe, MD (Inactive)   Preoperative team, please contact this patient and set up a phone call appointment for further preoperative risk assessment. Please obtain consent and complete medication review. Thank you for your help.  I confirm that guidance regarding antiplatelet and oral anticoagulation therapy has been completed and, if necessary, noted below.  Per office protocol, if patient is without any new symptoms or concerns at the time of their virtual visit, he/she may hold Plavix for 5 days prior to procedure. Please resume Plavix as soon as possible postprocedure, at the discretion of the surgeon.  He should continue aspirin 81 mg daily throughout the perioperative period.     I also confirmed the patient resides in the state of [place]. As per [facility] telemedicine laws, the patient must reside in the state in which the provider is licensed.   Joylene Grapes, NP 03/07/2023, 10:55 AM [facility]

## 2023-03-07 NOTE — Patient Instructions (Addendum)
 You have been scheduled for a colonoscopy. Please follow written instructions given to you at your visit today.   If you use inhalers (even only as needed), please bring them with you on the day of your procedure.  DO NOT TAKE 7 DAYS PRIOR TO TEST- Trulicity (dulaglutide) Ozempic, Wegovy (semaglutide) Mounjaro (tirzepatide) Bydureon Bcise (exanatide extended release)  DO NOT TAKE 1 DAY PRIOR TO YOUR TEST Rybelsus (semaglutide) Adlyxin (lixisenatide) Victoza (liraglutide) Byetta (exanatide) ___________________________________________________________________________  Bonita Quin will receive your bowel preparation through [facility], which ensures the lowest copay and home delivery, with outreach via text or call from an [phone] number. Please respond promptly to avoid rescheduling of your procedure. If you are interested in alternative options or have any questions regarding your prep, please contact them at [phone] ____________________________________________________________________________  Your Provider Has Sent Your Bowel Prep Regimen To [facility]   [facility] will contact you to verify your information and collect your copay, if applicable. Enjoy the comfort of your home while your prescription is mailed to you, FREE of any shipping charges.   [facility] accepts all major insurance benefits and applies discounts & coupons.  Have additional questions?   Chat: [email or web address] Call: [phone] Email: [email or web address] NCPDP: [id number]  How will [facility] contact you?  With a Welcome phone call,  a Welcome text and a checkout link in text form.  Texts you receive from [phone] Are NOT Spam.  *To set up delivery, you must complete the checkout process via link or speak to one of the patient care representatives. If [facility] is unable to reach you, your prescription may be delayed.  To avoid long hold times on the phone, you may also utilize the secure chat  feature on the [facility] website to request that they call you back for transaction completion or to expedite your concerns.   Due to recent changes in healthcare laws, you may see the results of your imaging and laboratory studies on MyChart before your provider has had a chance to review them.  We understand that in some cases there may be results that are confusing or concerning to you. Not all laboratory results come back in the same time frame and the provider may be waiting for multiple results in order to interpret others.  Please give [place] 48 hours in order for your provider to thoroughly review all the results before contacting the office for clarification of your results.   Thank you for trusting me with your gastrointestinal care!   Alcide Evener, CRNP

## 2023-03-07 NOTE — Progress Notes (Signed)
 03/07/2023 Dylan Lane [id number] 04/02/1950   Chief Complaint: Schedule a colonoscopy   History of Present Illness: Dylan Lane is a 73 year old male with a past medial history of hypertension, hyperlipidemia, coronary artery disease s/p NSTEMI and DES x 1 10/2019, prediabetes, CKD, GERD and colon polyps. He is known by Dr. Rhea Belton.  He denies having any upper or lower abdominal pain.  He passes a normal brown bowel movement daily.  No rectal bleeding or black stools.  No nausea or vomiting.  No heartburn.  He stated his prior difficulty swallowing symptoms he experienced in 2023 abated.  He remains on Pantoprazole 40 mg once daily.  His most recent colonoscopy by Dr. Loreta Ave was 03/21/2016 which identified 2 hyperplastic polyps removed from the right colon and internal hemorrhoids.  The quality of the bowel prep was documented as fair at best which required multiple washes and small polyps could be missed.  A repeat colonoscopy in 5 years was recommended.  No known family history of colon polyps or colorectal cancer.  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  Biopsies were negative for H. pylori.  He has a history of CAD status post MI and DES to the distal SFX on 10/2019.  He was admitted to the hospital with angina 12/08/2020 and a cardiac catheterization identified diffuse distal RCA disease which was treated with medical therapy.  He was last seen by her his cardiologist Dr. Izora Ribas 11/18/2022, at that time his cardiac status was stable and he was instructed to take aspirin and Plavix indefinitely. He remains on Plavix 75 mg daily but stated he is no longer taking ASA because he did not think he needed to take it. He remains quite active and denies having any chest pain, dizziness or shortness of breath. .      Latest Ref Rng & Units 12/29/2022   11:06 PM 08/02/2022     8:11 AM 12/22/2021    2:23 PM  CBC  WBC 4.0 - 10.5 K/uL 6.5  5.5  8.3   Hemoglobin 13.0 - 17.0 g/dL 40.9  81.1  91.4   Hematocrit 39.0 - 52.0 % 40.2  43.9  41.3   Platelets 150 - 400 K/uL 206  172.0  252.0        Latest Ref Rng & Units 12/29/2022   11:06 PM 11/18/2022    9:27 AM 08/02/2022    8:11 AM  CMP  Glucose 70 - 99 mg/dL 79  782  956   BUN 8 - 23 mg/dL 10  18  15    Creatinine 0.61 - 1.24 mg/dL 2.13  0.86  5.78   Sodium 135 - 145 mmol/L 137  143  141   Potassium 3.5 - 5.1 mmol/L 3.5  4.2  4.0   Chloride 98 - 111 mmol/L 104  105  104   CO2 22 - 32 mmol/L 27  29  30    Calcium 8.9 - 10.3 mg/dL 9.7  46.9  62.9   Total Protein 6.5 - 8.1 g/dL 6.6   7.0   Total Bilirubin <1.2 mg/dL 0.5   0.7   Alkaline Phos 38 - 126 U/L 101   106   AST 15 - 41 U/L 19   19   ALT 0 - 44 U/L 22   26     Cardiac catheterization 12/10/2020: CONCLUSIONS: Wide patency of the  left main Eccentric 65 to 75% proximal calcified LAD.  60 to 70% ostial to proximal calcified first diagonal forming a [place] 111 bifurcation stenosis.  RFR and FFR of LAD suggested nonhemodynamically significant physiology. Distal circumflex stent was widely patent. Right coronary has unusual anatomy.  The inferior wall is supplied by a large right ventricular branch that does not have any obstruction.  The distal RCA and left ventricular branches are very small territory, less than 2 mm diameter in the distal vessel with tandem 95% stenoses.  Territories not large enough to intervene upon. Normal left ventricular end-diastolic pressure with EDP 10 mmHg  PAST GI IMAGE STUDIES AND PROCEDURES:  Barium swallow 12/30/2019: 1. 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 2. Question of granular mucosal pattern which could be seen in the setting of Barrett's esophagus, this finding is subtle on today's exam. Esophagoscopy may be helpful as warranted for further assessment. 3. Small  hiatal hernia. 4. No visible gastroesophageal reflux on the current study during limited assessment.  EGD 01/2020:  - LA Grade A reflux esophagitis with no bleeding. - Gastritis. Biopsied. 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 [place] IS NEGATIVE FOR HELICOBACTER PYLORI. - NO INTESTINAL METAPLASIA, DYSPLASIA, OR MALIGNANCY.  Colonoscopy 03/21/2016:  -The quality of the bowel prep was fair at best as the patient ate the day before the procedure, multiple washes were done, small polyps could be missed. - 5 year recall colonoscopy  - Path report consistent with hyperplastic changes, possibly representing a diminutive hyperplastic polyp  Colonoscopy 10/14/2010 by Dr. Arlyce Dice: 5 mm sessile adenomatous polyp in the sigmoid colon Diverticula scattered in the ascending colon  Past Medical History:  Diagnosis Date   Arthritis    CAD (coronary artery disease), native coronary artery    a. cath 10/21/19 s/p DES to Lcx, medical therapy for 50% LAD stenosis.   Colon polyps    3/06 and 2009:needs repeat in 3 yrs(3/12)   GERD (gastroesophageal reflux disease) 2017   Hyperlipidemia    Hypertension    Myocardial infarction (HCC)    Pre-diabetes    with fasting glucose of 110(2/09)   Rectal bleeding    History of   Scoliosis    Current Outpatient Medications on File Prior to Visit  Medication Sig Dispense Refill   alfuzosin (UROXATRAL) 10 MG 24 hr tablet Take 10 mg by mouth daily.     amLODipine (NORVASC) 10 MG tablet TAKE 1 TABLET BY MOUTH ONCE DAILY 30 tablet 10   aspirin 81 MG EC tablet Take 1 tablet (81 mg total) by mouth daily. 30 tablet 11   atorvastatin (LIPITOR) 80 MG tablet Take 1 tablet (80 mg total) by mouth daily. 90 tablet 3   benzonatate (TESSALON) 200 MG capsule Take 1 capsule (200 mg total) by mouth 3 (three) times daily as needed. 30 capsule 1   clopidogrel (PLAVIX) 75 MG tablet  TAKE 1 TABLET BY MOUTH ONCE DAILY 90 tablet 3   ezetimibe (ZETIA) 10 MG tablet Take 1 tablet (10 mg total) by mouth daily. 90 tablet 3   metFORMIN (GLUCOPHAGE-XR) 500 MG 24 hr tablet TAKE 1 TABLET BY MOUTH EVERY MORNING 30 tablet 10   metoprolol succinate (TOPROL-XL) 25 MG 24 hr tablet TAKE 1 TABLET BY MOUTH ONCE DAILY 90 tablet 3   pantoprazole (PROTONIX) 40 MG tablet Take 1 tablet (40 mg total) by mouth 2 (two)  times daily. Needs office follow up 180 tablet 0   nitroGLYCERIN (NITROSTAT) 0.4 MG SL tablet DISSOLVE 1 TABLET UNDER THE TONGUE EVERY 5 MINUTES AS NEEDED FOR CHEST PAIN. DO NOT EXCEED A TOTAL OF 3 DOSES IN 15 MINUTES. (Patient not taking: Reported on 03/07/2023) 25 tablet 3   Current Facility-Administered Medications on File Prior to Visit  Medication Dose Route Frequency Provider Last Rate Last Admin   sodium chloride flush (NS) 0.9 % injection 3 mL  3 mL Intravenous Q12H Wendall Stade, MD       Allergies  Allergen Reactions   Amoxicillin Hives        Current Medications, Allergies, Past Medical History, Past Surgical History, Family History and Social History were reviewed in [employer] record.  Review of Systems:   Constitutional: Negative for fever, sweats, chills or weight loss.  Respiratory: Negative for shortness of breath.   Cardiovascular: Negative for chest pain, palpitations and leg swelling.  Gastrointestinal: See HPI.  Musculoskeletal: Negative for back pain or muscle aches.  Neurological: Negative for dizziness, headaches or paresthesias.   Physical Exam: BP 130/78 (BP Location: Left Arm, Patient Position: Sitting, Cuff Size: Normal)   Pulse 66   Ht 5\' 7"  (1.702 m)   Wt 193 lb 6.4 oz (87.7 kg)   BMI 30.29 kg/m   General: 73 year old male in no acute distress. Head: Normocephalic and atraumatic. Eyes: No scleral icterus. Conjunctiva pink . Ears: Normal auditory acuity. Mouth: Upper dentures.  No ulcers or lesions.  Lungs: Clear  throughout to auscultation. Heart: Regular rate and rhythm, no murmur. Abdomen: Soft, nontender and nondistended. No masses or hepatomegaly. Normal bowel sounds x 4 quadrants.  Rectal: Deferred.  Musculoskeletal: Symmetrical with no gross deformities. Extremities: No edema. Neurological: Alert oriented x 4. No focal deficits.  Psychological: Alert and cooperative. Normal mood and affect  Assessment and Recommendations:  73 year old male with a history of colon polyps presents to schedule a colonoscopy.  Colonoscopy 03/2016 identified 2 hyperplastic polyps, 5 year recall recommended due to suboptimal prep. Colonoscopy in 2012 identified 1 adenomatous polyp removed from the colon. -Colonoscopy benefits and risks discussed including risk with sedation, risk of bleeding, perforation and infection  -2-day bowel prep due to suboptimal bowel prep per colonoscopy 03/2016  GERD, stable on Pantoprazole 40mg  every day. 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  Biopsies were negative for H. pylori. -Continue Pantoprazole 40mg  every day -GERD diet   History of CAD s/p MI and DES 10/2019. On Plavix every day. Patient stopped taking ASA as he thought he didn't need to take it.  Remains on Metoprolol and Atorvastatin. No angina.  -Patient instructed to contact his cardiologist Dr. Izora Ribas to verify aspirin instructions, last cardiology visit 11/18/2022 instructed the patient to continue ASA 81 mg daily and Plavix 75 mg daily indefinitely.   Prediabetes on Metformin

## 2023-03-07 NOTE — Telephone Encounter (Signed)
Left message for the pt to call back to schedule tele pre op appt

## 2023-03-07 NOTE — Telephone Encounter (Signed)
 [facility] Pre-operative Risk Assessment     Request for surgical clearance:     Endoscopy Procedure  What type of surgery is being performed?     Colonoscopy  When is this surgery scheduled?     04/27/23  What type of clearance is required ?   Pharmacy  Are there any medications that need to be held prior to surgery and how long? Plavix & 5 days  Practice name and name of physician performing surgery?      [facility]  What is your office phone and fax number?      Phone- [phone]  Fax- [phone]  Anesthesia type (None, local, MAC, general) ?       MAC   Please route your response to Brecken Walth, CMA

## 2023-03-08 ENCOUNTER — Telehealth: Payer: Self-pay | Admitting: *Deleted

## 2023-03-08 NOTE — Telephone Encounter (Signed)
 Pt has been scheduled tele preop appt 04/07/23. Med rec and consent are done.      Patient Consent for Virtual Visit        Jaxzen Junior Shamoon has provided verbal consent on 03/08/2023 for a virtual visit (video or telephone).   CONSENT FOR VIRTUAL VISIT FOR:  Greggory Stallion Junior Lequita Halt  By participating in this virtual visit I agree to the following:  I hereby voluntarily request, consent and authorize [facility] and its employed or contracted physicians, physician assistants, nurse practitioners or other licensed health care professionals (the Practitioner), to provide me with telemedicine health care services (the "Services") as deemed necessary by the treating Practitioner. I acknowledge and consent to receive the Services by the Practitioner via telemedicine. I understand that the telemedicine visit will involve communicating with the Practitioner through live audiovisual communication technology and the disclosure of certain medical information by electronic transmission. I acknowledge that I have been given the opportunity to request an in-person assessment or other available alternative prior to the telemedicine visit and am voluntarily participating in the telemedicine visit.  I understand that I have the right to withhold or withdraw my consent to the use of telemedicine in the course of my care at any time, without affecting my right to future care or treatment, and that the Practitioner or I may terminate the telemedicine visit at any time. I understand that I have the right to inspect all information obtained and/or recorded in the course of the telemedicine visit and may receive copies of available information for a reasonable fee.  I understand that some of the potential risks of receiving the Services via telemedicine include:  Delay or interruption in medical evaluation due to technological equipment failure or disruption; Information transmitted may not be sufficient (e.g.  poor resolution of images) to allow for appropriate medical decision making by the Practitioner; and/or  In rare instances, security protocols could fail, causing a breach of personal health information.  Furthermore, I acknowledge that it is my responsibility to provide information about my medical history, conditions and care that is complete and accurate to the best of my ability. I acknowledge that Practitioner's advice, recommendations, and/or decision may be based on factors not within their control, such as incomplete or inaccurate data provided by me or distortions of diagnostic images or specimens that may result from electronic transmissions. I understand that the practice of medicine is not an exact science and that Practitioner makes no warranties or guarantees regarding treatment outcomes. I acknowledge that a copy of this consent can be made available to me via my patient portal [facility] MyChart), or I can request a printed copy by calling the office of [facility].    I understand that my insurance will be billed for this visit.   I have read or had this consent read to me. I understand the contents of this consent, which adequately explains the benefits and risks of the Services being provided via telemedicine.  I have been provided ample opportunity to ask questions regarding this consent and the Services and have had my questions answered to my satisfaction. I give my informed consent for the services to be provided through the use of telemedicine in my medical care

## 2023-03-08 NOTE — Telephone Encounter (Signed)
 Pt has been scheduled tele preop appt 04/07/23. Med rec and consent are done.

## 2023-03-09 NOTE — Progress Notes (Signed)
 Addendum: Reviewed and agree with assessment and management plan. Asha Grumbine, Carie Caddy, MD

## 2023-03-10 ENCOUNTER — Other Ambulatory Visit: Payer: Self-pay | Admitting: Adult Health

## 2023-03-10 DIAGNOSIS — K219 Gastro-esophageal reflux disease without esophagitis: Secondary | ICD-10-CM

## 2023-03-10 MED ORDER — PANTOPRAZOLE SODIUM 40 MG PO TBEC: 40.0000 mg | DELAYED_RELEASE_TABLET | Freq: Two times a day (BID) | ORAL | 0 refills | Status: DC

## 2023-03-10 NOTE — Telephone Encounter (Signed)
 Copied from CRM [id number]. Topic: Clinical - Medication Refill >> Mar 10, 2023  8:15 AM Aletta Edouard wrote: Most Recent Primary Care Visit:  Provider: Shirline Frees  Department: [facility]  Visit Type: ACUTE  Date: 02/14/2023  Medication: pantoprazole (PROTONIX) 40 MG tablet  Has the patient contacted their pharmacy? No (Agent: If no, request that the patient contact the pharmacy for the refill. If patient does not wish to contact the pharmacy document the reason why and proceed with request.) (Agent: If yes, when and what did the pharmacy advise?)  Is this the correct pharmacy for this prescription? Yes If no, delete pharmacy and type the correct one.  This is the patient's preferred pharmacy:  [facility] [place], [place] - [address] [place] [address] Phone: [phone] Fax: [phone]   Has the prescription been filled recently? No  Is the patient out of the medication? Yes  Has the patient been seen for an appointment in the last year OR does the patient have an upcoming appointment? Yes  Can we respond through MyChart? No  Agent: Please be advised that Rx refills may take up to 3 business days. We ask that you follow-up with your pharmacy.

## 2023-03-16 ENCOUNTER — Encounter: Payer: Self-pay | Admitting: Adult Health

## 2023-03-29 ENCOUNTER — Telehealth: Payer: Self-pay

## 2023-03-29 NOTE — Telephone Encounter (Signed)
 Pt has been scheduled.

## 2023-03-29 NOTE — Telephone Encounter (Signed)
 Copied from CRM [id number]. Topic: General - Call Back - No Documentation >> Mar 29, 2023 10:37 AM [name] wrote: Reason for CRM: Pt is requesting a call back from Dr.Nafziger, he states that it is a personal matter that he would like to speak to him about

## 2023-03-30 ENCOUNTER — Telehealth: Admitting: Adult Health

## 2023-03-30 DIAGNOSIS — I2510 Atherosclerotic heart disease of native coronary artery without angina pectoris: Secondary | ICD-10-CM | POA: Diagnosis not present

## 2023-03-30 DIAGNOSIS — Z9861 Coronary angioplasty status: Secondary | ICD-10-CM
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
## 2023-04-07 ENCOUNTER — Ambulatory Visit: Attending: Internal Medicine | Admitting: Emergency Medicine

## 2023-04-07 DIAGNOSIS — Z01810 Encounter for preprocedural cardiovascular examination: Secondary | ICD-10-CM | POA: Diagnosis not present
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
## 2023-04-10 NOTE — Telephone Encounter (Signed)
 Patient returning phone call. Please advise, thank you.

## 2023-04-10 NOTE — Telephone Encounter (Signed)
 Attempted to contact patient and left a voicemail to return call regarding Plavix hold.

## 2023-04-10 NOTE — Telephone Encounter (Signed)
 Contacted patient and patient verbalized that he may hold his Plavix 5 days prior to his procedure per Cardiology.

## 2023-04-19 ENCOUNTER — Encounter: Payer: Self-pay | Admitting: Internal Medicine

## 2023-04-27 ENCOUNTER — Ambulatory Visit (AMBULATORY_SURGERY_CENTER): Admitting: Internal Medicine

## 2023-04-27 ENCOUNTER — Encounter: Payer: Self-pay | Admitting: Internal Medicine

## 2023-04-27 VITALS — BP 103/70 | HR 55 | Temp 97.1°F | Resp 10 | Ht 67.0 in | Wt 193.0 lb

## 2023-04-27 DIAGNOSIS — K648 Other hemorrhoids: Secondary | ICD-10-CM

## 2023-04-27 DIAGNOSIS — D123 Benign neoplasm of transverse colon: Secondary | ICD-10-CM

## 2023-04-27 DIAGNOSIS — D122 Benign neoplasm of ascending colon: Secondary | ICD-10-CM

## 2023-04-27 DIAGNOSIS — K5730 Diverticulosis of large intestine without perforation or abscess without bleeding: Secondary | ICD-10-CM | POA: Diagnosis not present

## 2023-04-27 DIAGNOSIS — I2510 Atherosclerotic heart disease of native coronary artery without angina pectoris: Secondary | ICD-10-CM | POA: Diagnosis not present

## 2023-04-27 DIAGNOSIS — Z1211 Encounter for screening for malignant neoplasm of colon: Secondary | ICD-10-CM

## 2023-04-27 DIAGNOSIS — E785 Hyperlipidemia, unspecified: Secondary | ICD-10-CM | POA: Diagnosis not present

## 2023-04-27 DIAGNOSIS — Z860100 Personal history of colon polyps, unspecified: Secondary | ICD-10-CM

## 2023-04-27 DIAGNOSIS — I10 Essential (primary) hypertension: Secondary | ICD-10-CM | POA: Diagnosis not present

## 2023-04-27 DIAGNOSIS — D124 Benign neoplasm of descending colon: Secondary | ICD-10-CM

## 2023-04-27 DIAGNOSIS — D120 Benign neoplasm of cecum: Secondary | ICD-10-CM

## 2023-04-27 MED ORDER — SODIUM CHLORIDE 0.9 % IV SOLN: 500.0000 mL | Freq: Once | INTRAVENOUS | Status: DC

## 2023-04-27 NOTE — Patient Instructions (Signed)
 Resume previous diet and medications. Restart Plavix  tomorrow. Ok to resume aspirin  today. Awaiting pathology results. Repeat Colonoscopy date to be determined based on pathology results. Handout provided on Colon polyps and Diverticulosis  YOU HAD AN ENDOSCOPIC PROCEDURE TODAY AT THE [facility]:   Refer to the procedure report that was given to you for any specific questions about what was found during the examination.  If the procedure report does not answer your questions, please call your gastroenterologist to clarify.  If you requested that your care partner not be given the details of your procedure findings, then the procedure report has been included in a sealed envelope for you to review at your convenience later.  YOU SHOULD EXPECT: Some feelings of bloating in the abdomen. Passage of more gas than usual.  Walking can help get rid of the air that was put into your GI tract during the procedure and reduce the bloating. If you had a lower endoscopy (such as a colonoscopy or flexible sigmoidoscopy) you may notice spotting of blood in your stool or on the toilet paper. If you underwent a bowel prep for your procedure, you may not have a normal bowel movement for a few days.  Please Note:  You might notice some irritation and congestion in your nose or some drainage.  This is from the oxygen used during your procedure.  There is no need for concern and it should clear up in a day or so.  SYMPTOMS TO REPORT IMMEDIATELY:  Following lower endoscopy (colonoscopy or flexible sigmoidoscopy):  Excessive amounts of blood in the stool  Significant tenderness or worsening of abdominal pains  Swelling of the abdomen that is new, acute  Fever of 100F or higher  For urgent or emergent issues, a gastroenterologist can be reached at any hour by calling [phone]. Do not use MyChart messaging for urgent concerns.    DIET:  We do recommend a small meal at first, but then you may proceed  to your regular diet.  Drink plenty of fluids but you should avoid alcoholic beverages for 24 hours.  ACTIVITY:  You should plan to take it easy for the rest of today and you should NOT DRIVE or use heavy machinery until tomorrow (because of the sedation medicines used during the test).    FOLLOW UP: Our staff will call the number listed on your records the next business day following your procedure.  We will call around 7:15- 8:00 am to check on you and address any questions or concerns that you may have regarding the information given to you following your procedure. If we do not reach you, we will leave a message.     If any biopsies were taken you will be contacted by phone or by letter within the next 1-3 weeks.  Please call us  at [phone] if you have not heard about the biopsies in 3 weeks.    SIGNATURES/CONFIDENTIALITY: You and/or your care partner have signed paperwork which will be entered into your electronic medical record.  These signatures attest to the fact that that the information above on your After Visit Summary has been reviewed and is understood.  Full responsibility of the confidentiality of this discharge information lies with you and/or your care-partner.
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
## 2023-04-27 NOTE — Op Note (Signed)
 [facility] Patient Name: Dylan Lane Procedure Date: 04/27/2023 2:32 PM MRN: [id number] Endoscopist: Nannette Babe , MD, [id number] Age: 73 Referring MD:  Date of Birth: 11/30/1950 Gender: Male Account #: [id number] Procedure:                Colonoscopy Indications:              High risk colon cancer surveillance: Personal                            history of non-advanced adenoma (2012); colonoscopy                            with Dr. Tova Fresh in March 2018 with fair prep Medicines:                Monitored Anesthesia Care Procedure:                Pre-Anesthesia Assessment:                           - Prior to the procedure, a History and Physical                            was performed, and patient medications and                            allergies were reviewed. The patient's tolerance of                            previous anesthesia was also reviewed. The risks                            and benefits of the procedure and the sedation                            options and risks were discussed with the patient.                            All questions were answered, and informed consent                            was obtained. Prior Anticoagulants: The patient has                            taken Plavix  (clopidogrel ), last dose was 5 days                            prior to procedure. ASA Grade Assessment: III - A                            patient with severe systemic disease. After                            reviewing the risks and benefits, the patient was  deemed in satisfactory condition to undergo the                            procedure.                           After obtaining informed consent, the colonoscope                            was passed under direct vision. Throughout the                            procedure, the patient's blood pressure, pulse, and                            oxygen saturations were monitored  continuously. The                            CF [id number] #[id number] was introduced through the anus                            and advanced to the cecum, identified by                            appendiceal orifice and ileocecal valve. The                            colonoscopy was performed without difficulty. The                            patient tolerated the procedure well. The quality                            of the bowel preparation was good. The ileocecal                            valve, appendiceal orifice, and rectum were                            photographed. Scope In: 2:48:28 PM Scope Out: 3:05:07 PM Scope Withdrawal Time: 0 hours 14 minutes 2 seconds  Total Procedure Duration: 0 hours 16 minutes 39 seconds  Findings:                 The digital rectal exam was normal.                           A 6 mm polyp was found in the cecum. The polyp was                            sessile. The polyp was removed with a cold snare.                            Resection and retrieval were complete.  Two sessile polyps were found in the ascending                            colon. The polyps were 5 to 8 mm in size. These                            polyps were removed with a cold snare. Resection                            and retrieval were complete.                           Three sessile polyps were found in the transverse                            colon. The polyps were 4 to 7 mm in size. These                            polyps were removed with a cold snare. Resection                            and retrieval were complete.                           Three sessile polyps were found in the descending                            colon. The polyps were 3 to 7 mm in size. These                            polyps were removed with a cold snare. Resection                            and retrieval were complete.                           Multiple medium-mouthed and  small-mouthed                            diverticula were found in the sigmoid colon,                            proximal transverse colon and hepatic flexure.                           Internal hemorrhoids were found during                            retroflexion. The hemorrhoids were medium-sized. Complications:            No immediate complications. Estimated Blood Loss:     Estimated blood loss was minimal. Impression:               - One 6 mm polyp in the cecum, removed  with a cold                            snare. Resected and retrieved.                           - Two 5 to 8 mm polyps in the ascending colon,                            removed with a cold snare. Resected and retrieved.                           - Three 4 to 7 mm polyps in the transverse colon,                            removed with a cold snare. Resected and retrieved.                           - Three 3 to 7 mm polyps in the descending colon,                            removed with a cold snare. Resected and retrieved.                           - Moderate diverticulosis in the sigmoid colon, in                            the proximal transverse colon and at the hepatic                            flexure.                           - Internal hemorrhoids. Recommendation:           - Patient has a contact number available for                            emergencies. The signs and symptoms of potential                            delayed complications were discussed with the                            patient. Return to normal activities tomorrow.                            Written discharge instructions were provided to the                            patient.                           - Resume previous diet.                           -  Continue present medications.                           - Resume Plavix  (clopidogrel ) at prior dose                            tomorrow. Okay to resume aspirin  today (decision to                             hold this medication was made by the patient).                            Refer to managing physician for further adjustment                            of therapy.                           - Await pathology results.                           - Repeat colonoscopy is recommended for                            surveillance. The colonoscopy date will be                            determined after pathology results from today's                            exam become available for review. Nannette Babe, MD 04/27/2023 3:09:54 PM This report has been signed electronically.

## 2023-04-27 NOTE — Progress Notes (Signed)
 Vss nad trans to pacu

## 2023-04-27 NOTE — Progress Notes (Signed)
 Called to room to assist during endoscopic procedure.  Patient ID and intended procedure confirmed with present staff. Received instructions for my participation in the procedure from the performing physician.

## 2023-04-28 ENCOUNTER — Telehealth: Payer: Self-pay | Admitting: *Deleted

## 2023-04-28 NOTE — Telephone Encounter (Signed)
  Follow up Call-     04/27/2023    1:52 PM  Call back number  Post procedure Call Back phone  # [phone]  Permission to leave phone message Yes     Patient questions:  Do you have a fever, pain , or abdominal swelling? No. Pain Score  0 *  Have you tolerated food without any problems? Yes.    Have you been able to return to your normal activities? Yes.    Do you have any questions about your discharge instructions: Diet   No. Medications  No. Follow up visit  No.  Do you have questions or concerns about your Care? No.  Actions: * If pain score is 4 or above: No action needed, pain <4.

## 2023-05-02 LAB — SURGICAL PATHOLOGY

## 2023-05-03 ENCOUNTER — Encounter: Payer: Self-pay | Admitting: Internal Medicine

## 2023-06-09 ENCOUNTER — Other Ambulatory Visit: Payer: Self-pay | Admitting: Adult Health

## 2023-06-09 DIAGNOSIS — K219 Gastro-esophageal reflux disease without esophagitis: Secondary | ICD-10-CM

## 2023-06-09 MED ORDER — PANTOPRAZOLE SODIUM 40 MG PO TBEC
40.0000 mg | DELAYED_RELEASE_TABLET | Freq: Two times a day (BID) | ORAL | 0 refills | Status: DC
Start: 2023-06-09 — End: 2023-07-04

## 2023-06-09 MED ORDER — EZETIMIBE 10 MG PO TABS: 10.0000 mg | ORAL_TABLET | Freq: Every day | ORAL | 3 refills | Status: AC

## 2023-06-09 NOTE — Telephone Encounter (Signed)
 Last Fill: Protonix : 03/10/23     Zetia : 11/21/22   Last OV: 02/14/23 Next OV: 07/28/23  Routing to provider for review/authorization.

## 2023-06-09 NOTE — Telephone Encounter (Signed)
 Copied from CRM [id number]. Topic: Clinical - Medication Refill >> Jun 09, 2023  2:29 PM Savanna F wrote: Medication: pantoprazole  (PROTONIX ) 40 MG tablet  ezetimibe  (ZETIA ) 10 MG tablet   Has the patient contacted their pharmacy? Yes (Agent: If no, request that the patient contact the pharmacy for the refill. If patient does not wish to contact the pharmacy document the reason why and proceed with request.) (Agent: If yes, when and what did the pharmacy advise?)  This is the patient's preferred pharmacy:   [facility]  Dorrene Gauc[place], [place] - [address] [place] [address] Phone: [phone] Fax: [phone]   Is this the correct pharmacy for this prescription? Yes If no, delete pharmacy and type the correct one.   Has the prescription been filled recently? No  Is the patient out of the medication? Yes  Has the patient been seen for an appointment in the last year OR does the patient have an upcoming appointment? Yes  Can we respond through MyChart? No, please call patient at [phone] and patient would like to speak to the nurse about not taking the medications as well, so please give him a call to discuss.   Agent: Please be advised that Rx refills may take up to 3 business days. We ask that you follow-up with your pharmacy.

## 2023-07-04 ENCOUNTER — Telehealth: Payer: Self-pay | Admitting: Adult Health

## 2023-07-04 DIAGNOSIS — K219 Gastro-esophageal reflux disease without esophagitis: Secondary | ICD-10-CM

## 2023-07-04 MED ORDER — PANTOPRAZOLE SODIUM 40 MG PO TBEC: 40.0000 mg | DELAYED_RELEASE_TABLET | Freq: Two times a day (BID) | ORAL | 0 refills | Status: DC

## 2023-07-04 NOTE — Telephone Encounter (Signed)
 Copied from CRM [id number]. Topic: Clinical - Medication Refill >> Jul 04, 2023  2:41 PM Chiquita SQUIBB wrote: Medication: pantoprazole  (PROTONIX ) 40 MG tablet [[id number]]  Has the patient contacted their pharmacy? Yes (Agent: If no, request that the patient contact the pharmacy for the refill. If patient does not wish to contact the pharmacy document the reason why and proceed with request.) (Agent: If yes, when and what did the pharmacy advise?)  This is the patient's preferred pharmacy:  [facility] [address] [place], [place] - [address] [place] [address] Phone: [phone] Fax: [phone]    Is this the correct pharmacy for this prescription? Yes If no, delete pharmacy and type the correct one.   Has the prescription been filled recently? No  Is the patient out of the medication? Yes  Has the patient been seen for an appointment in the last year OR does the patient have an upcoming appointment? Yes  Can we respond through MyChart? No  Agent: Please be advised that Rx refills may take up to 3 business days. We ask that you follow-up with your pharmacy.

## 2023-07-18 ENCOUNTER — Ambulatory Visit (INDEPENDENT_AMBULATORY_CARE_PROVIDER_SITE_OTHER): Admitting: Family Medicine

## 2023-07-18 DIAGNOSIS — Z0000 Encounter for general adult medical examination without abnormal findings: Secondary | ICD-10-CM | POA: Diagnosis not present
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
## 2023-07-18 NOTE — Patient Instructions (Signed)
 I really enjoyed getting to talk with you today! I am available on Tuesdays and Thursdays for virtual visits if you have any questions or concerns, or if I can be of any further assistance.   CHECKLIST FROM ANNUAL WELLNESS VISIT:  -Follow up (please call to schedule if not scheduled after visit):   -yearly for annual wellness visit with primary care office  Here is a list of your preventive care/health maintenance measures and the plan for each if any are due:  PLAN For any measures below that may be due:   Health Maintenance  Topic Date Due   COVID-19 Vaccine (4 - 2024-25 season) 09/04/2022   INFLUENZA VACCINE  08/04/2023   Medicare Annual Wellness (AWV)  07/17/2024   Colonoscopy  04/27/2026   Pneumococcal Vaccine: 50+ Years  Completed   Hepatitis C Screening  Completed   Zoster Vaccines- Shingrix  Completed   Hepatitis B Vaccines  Aged Out   HPV VACCINES  Aged Out   Meningococcal B Vaccine  Aged Out   DTaP/Tdap/Td  Discontinued    -See a dentist at least yearly  -Get your eyes checked and then per your eye specialist's recommendations  -Other issues addressed today:   -I have included below further information regarding a healthy whole foods based diet, physical activity guidelines for adults, stress management and opportunities for social connections. I hope you find this information useful.   -----------------------------------------------------------------------------------------------------------------------------------------------------------------------------------------------------------------------------------------------------------    NUTRITION: -eat real food: lots of colorful vegetables (half the plate) and fruits -5-7 servings of vegetables and fruits per day (fresh or steamed is best), exp. 2 servings of vegetables with lunch and dinner and 2 servings of fruit per day. Berries and greens such as kale and collards are great choices.  -consume on a regular  basis:  fresh fruits, fresh veggies, fish, nuts, seeds, healthy oils (such as olive oil, avocado oil), whole grains (make sure for bread/pasta/crackers/etc., that the first ingredient on label contains the word whole), legumes. -can eat small amounts of dairy and lean meat (no larger than the palm of your hand), but avoid processed meats such as ham, bacon, lunch meat, etc. -drink water -try to avoid fast food and pre-packaged foods, processed meat, ultra processed foods/beverages (donuts, candy, etc.) -most experts advise limiting sodium to < 2300mg  per day, should limit further is any chronic conditions such as high blood pressure, heart disease, diabetes, etc. The American Heart Association advised that < 1500mg  is is ideal -try to avoid foods/beverages that contain any ingredients with names you do not recognize  -try to avoid foods/beverages  with added sugar or sweeteners/sweets  -try to avoid sweet drinks (including diet drinks): soda, juice, Gatorade, sweet tea, power drinks, diet drinks -try to avoid white rice, white bread, pasta (unless whole grain)  EXERCISE GUIDELINES FOR ADULTS: -if you wish to increase your physical activity, do so gradually and with the approval of your doctor -STOP and seek medical care immediately if you have any chest pain, chest discomfort or trouble breathing when starting or increasing exercise  -move and stretch your body, legs, feet and arms when sitting for long periods -Physical activity guidelines for optimal health in adults: -get at least 150 minutes per week of moderate exercise (can talk, but not sing); this is about 20-30 minutes of sustained activity 5-7 days per week or two 10-15 minute episodes of sustained activity 5-7 days per week -do some muscle building/resistance training/strength training at least 2 days per week  -balance exercises 3+  days per week:   Stand somewhere where you have something sturdy to hold onto if you lose balance    1)  lift up on toes, then back down, start with 5x per day and work up to 20x   2) stand and lift one leg straight out to the side so that foot is a few inches of the floor, start with 5x each side and work up to 20x each side   3) stand on one foot, start with 5 seconds each side and work up to 20 seconds on each side  If you need ideas or help with getting more active:  -Silver sneakers https://tools.silversneakers.com  -Walk with a Doc: http://www.duncan-williams.com/  -try to include resistance (weight lifting/strength building) and balance exercises twice per week: or the following link for ideas: http://castillo-powell.com/  BuyDucts.dk  STRESS MANAGEMENT: -can try meditating, or just sitting quietly with deep breathing while intentionally relaxing all parts of your body for 5 minutes daily -if you need further help with stress, anxiety or depression please follow up with your primary doctor or contact the wonderful folks at [facility]: [phone]  SOCIAL CONNECTIONS: -options in [place] if you wish to engage in more social and exercise related activities:  -Silver sneakers https://tools.silversneakers.com  -Walk with a Doc: http://www.duncan-williams.com/  -Check out the [facility] Active Adults 50+ section on the [place] of [employer] (hiking clubs, book clubs, cards and games, chess, exercise classes, aquatic classes and much more) - see the website for details: https://www.-[place].gov/departments/parks-recreation/active-adults50  -YouTube has lots of exercise videos for different ages and abilities as well  -[facility] (a variety of indoor and outdoor inperson activities for adults). [phone]. [address]  -Virtual Online Classes (a variety of topics): see seniorplanet.org or call [phone]  -consider volunteering at a school, hospice  center, church, senior center or elsewhere

## 2023-07-28 ENCOUNTER — Ambulatory Visit: Payer: Self-pay | Admitting: Adult Health

## 2023-07-28 ENCOUNTER — Encounter: Payer: Self-pay | Admitting: Adult Health

## 2023-07-28 ENCOUNTER — Ambulatory Visit: Payer: Medicare HMO | Admitting: Adult Health

## 2023-07-28 VITALS — BP 120/80 | HR 66 | Temp 97.9°F | Ht 67.0 in | Wt 197.0 lb

## 2023-07-28 DIAGNOSIS — K219 Gastro-esophageal reflux disease without esophagitis: Secondary | ICD-10-CM | POA: Diagnosis not present

## 2023-07-28 DIAGNOSIS — I2510 Atherosclerotic heart disease of native coronary artery without angina pectoris: Secondary | ICD-10-CM | POA: Diagnosis not present

## 2023-07-28 DIAGNOSIS — I10 Essential (primary) hypertension: Secondary | ICD-10-CM | POA: Diagnosis not present

## 2023-07-28 DIAGNOSIS — N401 Enlarged prostate with lower urinary tract symptoms: Secondary | ICD-10-CM | POA: Diagnosis not present

## 2023-07-28 DIAGNOSIS — Z9861 Coronary angioplasty status: Secondary | ICD-10-CM

## 2023-07-28 DIAGNOSIS — R7303 Prediabetes: Secondary | ICD-10-CM

## 2023-07-28 DIAGNOSIS — R351 Nocturia: Secondary | ICD-10-CM

## 2023-07-28 DIAGNOSIS — Z0000 Encounter for general adult medical examination without abnormal findings: Secondary | ICD-10-CM | POA: Diagnosis not present

## 2023-07-28 DIAGNOSIS — E785 Hyperlipidemia, unspecified: Secondary | ICD-10-CM

## 2023-07-28 LAB — LIPID PANEL
Cholesterol: 82 mg/dL (ref 0–200)
HDL: 33.9 mg/dL — ABNORMAL LOW (ref >39.00)
LDL Cholesterol: 38 mg/dL (ref 0–99)
NonHDL: 48.32
Total CHOL/HDL Ratio: 2
Triglycerides: 54 mg/dL (ref 0.0–149.0)
VLDL: 10.8 mg/dL (ref 0.0–40.0)

## 2023-07-28 LAB — CBC
HCT: 43.1 % (ref 39.0–52.0)
Hemoglobin: 14.7 g/dL (ref 13.0–17.0)
MCHC: 34 g/dL (ref 30.0–36.0)
MCV: 85.4 fl (ref 78.0–100.0)
Platelets: 186 K/uL (ref 150.0–400.0)
RBC: 5.05 Mil/uL (ref 4.22–5.81)
RDW: 14.4 % (ref 11.5–15.5)
WBC: 6.3 K/uL (ref 4.0–10.5)

## 2023-07-28 LAB — COMPREHENSIVE METABOLIC PANEL WITH GFR
ALT: 31 U/L (ref 0–53)
AST: 19 U/L (ref 0–37)
Albumin: 4.5 g/dL (ref 3.5–5.2)
Alkaline Phosphatase: 112 U/L (ref 39–117)
BUN: 13 mg/dL (ref 6–23)
CO2: 29 meq/L (ref 19–32)
Calcium: 10 mg/dL (ref 8.4–10.5)
Chloride: 105 meq/L (ref 96–112)
Creatinine, Ser: 1.14 mg/dL (ref 0.40–1.50)
GFR: 65.08 mL/min (ref >60.00)
Glucose, Bld: 113 mg/dL — ABNORMAL HIGH (ref 70–99)
Potassium: 4.5 meq/L (ref 3.5–5.1)
Sodium: 142 meq/L (ref 135–145)
Total Bilirubin: 0.8 mg/dL (ref 0.2–1.2)
Total Protein: 6.9 g/dL (ref 6.0–8.3)

## 2023-07-28 LAB — TSH: TSH: 1.39 u[IU]/mL (ref 0.35–5.50)

## 2023-07-28 LAB — HEMOGLOBIN A1C: Hgb A1c MFr Bld: 6.6 % — ABNORMAL HIGH (ref 4.6–6.5)

## 2023-07-28 LAB — PSA: PSA: 1.06 ng/mL (ref 0.10–4.00)
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
## 2023-07-28 NOTE — Patient Instructions (Signed)
 It was great seeing you today   We will follow up with you regarding your lab work   Please let me know if you need anything

## 2023-08-01 ENCOUNTER — Telehealth: Payer: Self-pay | Admitting: Adult Health

## 2023-08-01 NOTE — Telephone Encounter (Signed)
 Copied from CRM [id number]. Topic: Clinical - Lab/Test Results >> Jul 31, 2023  7:40 AM Larissa RAMAN wrote: Reason for CRM: Patient called to obtain lab results. Relayed information per provider's note, verbatim. Patient has additional questions and request a callback

## 2023-08-01 NOTE — Telephone Encounter (Signed)
 Patient notified of update  and verbalized understanding.

## 2023-08-01 NOTE — Telephone Encounter (Unsigned)
 Copied from CRM [id number]. Topic: General - Other >> Jul 31, 2023 10:48 AM Revonda D wrote: Reason for CRM: Pt stated that he is returning a missed call from [place]. I informed the pt that Marjorie was calling to relay a message from [facility], in regards to his lab results. Cory,NP, stated that his A1c has increased into the diabetic category. I would like to increase Metformin  to 500 mg BID and he really needs to start walking and eating less carbs and sugars. I would like for him to follow up in 3 months for retesting.  Pt was made aware of the results and would like to schedule the retesting labs.

## 2023-08-01 NOTE — Telephone Encounter (Signed)
 Pt notified of update and verbalized understanding. Pt has been scheduled.

## 2023-08-01 NOTE — Telephone Encounter (Signed)
 Pt is aware of his blood work results however pt has questions

## 2023-08-02 ENCOUNTER — Other Ambulatory Visit: Payer: Self-pay

## 2023-08-02 ENCOUNTER — Telehealth: Payer: Self-pay | Admitting: Adult Health

## 2023-08-02 MED ORDER — METFORMIN HCL ER 500 MG PO TB24: 500.0000 mg | ORAL_TABLET | Freq: Two times a day (BID) | ORAL | 0 refills | Status: DC

## 2023-08-02 NOTE — Telephone Encounter (Signed)
 Pt is still waiting on new increase of   metformin  to be sent to  [facility]  [place], [place] - [address] Phone: [phone]  Fax: [phone]

## 2023-08-02 NOTE — Telephone Encounter (Signed)
 Rx sent in to pharmacy.

## 2023-08-04 ENCOUNTER — Other Ambulatory Visit: Payer: Self-pay

## 2023-08-04 MED ORDER — METFORMIN HCL ER 500 MG PO TB24: 500.0000 mg | ORAL_TABLET | Freq: Two times a day (BID) | ORAL | Status: DC

## 2023-08-17 ENCOUNTER — Other Ambulatory Visit: Payer: Self-pay | Admitting: Adult Health

## 2023-08-17 DIAGNOSIS — I10 Essential (primary) hypertension: Secondary | ICD-10-CM

## 2023-08-24 ENCOUNTER — Other Ambulatory Visit: Payer: Self-pay | Admitting: Adult Health

## 2023-08-24 ENCOUNTER — Telehealth: Payer: Self-pay

## 2023-08-24 DIAGNOSIS — K219 Gastro-esophageal reflux disease without esophagitis: Secondary | ICD-10-CM

## 2023-08-24 NOTE — Telephone Encounter (Signed)
 Copied from CRM [id number]. Topic: General - Other >> Aug 24, 2023  1:20 PM Thersia C wrote: Reason for CRM: Patient called in would like for NP [facility] nurse to give him a call back has a couple questions to ask

## 2023-08-25 NOTE — Telephone Encounter (Signed)
 Spoke to pt and he stated he was advised no heavy lifting due to a stent placement. 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 Pt stated that when he did he started sweating and got SOB. Pt wanted to know what he should do. I advised pt to reach out to cardiology for advise. I also informed pt that I would send to [facility] for advise.

## 2023-08-31 ENCOUNTER — Telehealth: Payer: Self-pay | Admitting: *Deleted

## 2023-08-31 NOTE — Telephone Encounter (Signed)
 Tried to call pt no answer. Pt is a type 2 diabetic.

## 2023-08-31 NOTE — Telephone Encounter (Signed)
 Copied from CRM [id number]. Topic: Clinical - Medication Question >> Aug 31, 2023  4:38 PM DeAngela L wrote: Reason for CRM: Patient calling to ask if the nurse could call him and confirm if he has type 2 diabetes, patient was taking metformin  to keep from being type 2 but he is curious and would like to know for sure   Pt num [phone] (M)

## 2023-09-01 NOTE — Telephone Encounter (Signed)
 Pt notified of update

## 2023-09-14 ENCOUNTER — Other Ambulatory Visit: Payer: Self-pay | Admitting: Adult Health

## 2023-09-14 NOTE — Telephone Encounter (Signed)
 Copied from CRM #[id number]. Topic: Clinical - Medication Refill >> Sep 14, 2023  3:23 PM Taleah C wrote: Medication: alfuzosin   Has the patient contacted their pharmacy? Yes discontinued  This is the patient's preferred pharmacy:    [facility] [place]  [place] - [address] [place] [address] Phone: [phone] Fax: [phone]  Is this the correct pharmacy for this prescription? Yes If no, delete pharmacy and type the correct one.   Has the prescription been filled recently? Yes  Is the patient out of the medication? Yes  Has the patient been seen for an appointment in the last year OR does the patient have an upcoming appointment? Yes  Can we respond through MyChart? No  Agent: Please be advised that Rx refills may take up to 3 business days. We ask that you follow-up with your pharmacy.

## 2023-09-15 NOTE — Telephone Encounter (Signed)
 Is pt suppose to be on this medication. Hasn't been filled in 3 years.

## 2023-09-20 ENCOUNTER — Other Ambulatory Visit: Payer: Self-pay | Admitting: Adult Health

## 2023-09-28 ENCOUNTER — Telehealth: Payer: Self-pay | Admitting: Nurse Practitioner

## 2023-09-28 NOTE — Telephone Encounter (Signed)
   Awoke this AM and stretched his arms and has since had intermittent discomfort across his chest that is worse when he raises his arms.  He thinks it might be a little bit worse with palpation.  Symptoms are different than his prior angina which involved severe left arm discomfort.  He has not had any dyspnea.  I advised that based on his description, symptoms do sound musculoskeletal and that he can try taking Tylenol  but if symptoms progress or he develops any associated symptoms, he should have a low threshold to call EMS.  Caller verbalized understanding and was grateful for the call back.  Lonni Meager, NP 09/28/2023, 7:22 PM

## 2023-10-12 ENCOUNTER — Encounter: Payer: Self-pay | Admitting: Adult Health

## 2023-10-12 ENCOUNTER — Ambulatory Visit: Admitting: Adult Health

## 2023-10-12 VITALS — BP 110/70 | HR 76 | Temp 98.1°F | Ht 67.0 in | Wt 189.0 lb

## 2023-10-12 DIAGNOSIS — I10 Essential (primary) hypertension: Secondary | ICD-10-CM | POA: Diagnosis not present

## 2023-10-12 DIAGNOSIS — R7303 Prediabetes: Secondary | ICD-10-CM

## 2023-10-12 LAB — POCT GLYCOSYLATED HEMOGLOBIN (HGB A1C): Hemoglobin A1C: 5.9 % — AB (ref 4.0–5.6)
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
## 2023-10-12 NOTE — Patient Instructions (Signed)
 Health Maintenance Due  Topic Date Due   Influenza Vaccine  08/04/2023   COVID-19 Vaccine (4 - 2025-26 season) 09/04/2023       07/18/2023    6:06 PM 05/17/2022    4:17 PM 05/12/2022    1:48 PM  Depression screen PHQ 2/9  Decreased Interest 0 0 0  Down, Depressed, Hopeless 0 0 0  PHQ - 2 Score 0 0 0  Altered sleeping  0 0  Tired, decreased energy  0 0  Change in appetite  0 0  Feeling bad or failure about yourself   0 0  Trouble concentrating  0 0  Moving slowly or fidgety/restless  0 0  Suicidal thoughts  0 0  PHQ-9 Score  0 0  Difficult doing work/chores  Not difficult at all

## 2023-10-17 ENCOUNTER — Other Ambulatory Visit: Payer: Self-pay | Admitting: Internal Medicine

## 2023-10-18 NOTE — Telephone Encounter (Signed)
 Spoke to pt and he stated he was looking for eye doctor that's in network. I advised pt to reach out to insurance company for assistance. Pt verbalized understanding.

## 2023-10-18 NOTE — Telephone Encounter (Signed)
 Copied from CRM [id number]. Topic: Referral - Request for Referral >> Oct 17, 2023  8:53 AM Rosina BIRCH wrote: Did the patient discuss referral with their provider in the last year? No (If No - schedule appointment) (If Yes - send message)  Appointment offered? No-patient stated he forgot to mention it to his provider when he saw him on 10/9  Type of order/referral and detailed reason for visit: eye doctor  Preference of office, provider, location: [place]  If referral order, have you been seen by this specialty before? Yes (If Yes, this issue or another issue? When? Where?  Can we respond through MyChart? Yes  CB [phone]

## 2023-10-31 ENCOUNTER — Ambulatory Visit: Admitting: Adult Health

## 2023-10-31 DIAGNOSIS — R399 Unspecified symptoms and signs involving the genitourinary system: Secondary | ICD-10-CM | POA: Diagnosis not present

## 2023-10-31 DIAGNOSIS — N3941 Urge incontinence: Secondary | ICD-10-CM | POA: Diagnosis not present

## 2023-10-31 DIAGNOSIS — R3912 Poor urinary stream: Secondary | ICD-10-CM | POA: Diagnosis not present

## 2023-10-31 DIAGNOSIS — N401 Enlarged prostate with lower urinary tract symptoms: Secondary | ICD-10-CM | POA: Diagnosis not present

## 2023-10-31 DIAGNOSIS — R3915 Urgency of urination: Secondary | ICD-10-CM | POA: Diagnosis not present

## 2023-11-01 ENCOUNTER — Ambulatory Visit: Admitting: Adult Health

## 2023-11-06 ENCOUNTER — Telehealth: Payer: Self-pay

## 2023-11-06 NOTE — Telephone Encounter (Signed)
 Copied from CRM [id number]. Topic: Clinical - Prescription Issue >> Nov 06, 2023 10:29 AM Aleatha BROCKS wrote: Reason for CRM:  Patient has misplaced his medicine for his heart nitroGLYCERIN  (NITROSTAT ) 0.4 MG SL tablet and he needs more called in to [facility] [place]  - [place], [place] - [address] [place] [address] Phone: [phone] Fax: [phone] Hours: Not open 24 hours

## 2023-11-08 NOTE — Telephone Encounter (Signed)
 Advised pt to reach out to Cardio for refill. Pt verbalized understanding.

## 2023-11-09 ENCOUNTER — Ambulatory Visit: Admitting: Adult Health

## 2023-11-10 ENCOUNTER — Ambulatory Visit: Admitting: Adult Health

## 2023-11-15 ENCOUNTER — Other Ambulatory Visit: Payer: Self-pay | Admitting: Internal Medicine

## 2023-11-20 DIAGNOSIS — H40052 Ocular hypertension, left eye: Secondary | ICD-10-CM | POA: Diagnosis not present

## 2023-11-20 DIAGNOSIS — H52223 Regular astigmatism, bilateral: Secondary | ICD-10-CM | POA: Diagnosis not present

## 2023-11-20 DIAGNOSIS — H524 Presbyopia: Secondary | ICD-10-CM | POA: Diagnosis not present

## 2023-11-20 DIAGNOSIS — H25813 Combined forms of age-related cataract, bilateral: Secondary | ICD-10-CM | POA: Diagnosis not present

## 2023-11-28 ENCOUNTER — Telehealth: Payer: Self-pay | Admitting: Adult Health

## 2023-11-28 NOTE — Telephone Encounter (Signed)
 Copied from CRM #[id number]. Topic: Clinical - Refused Triage >> Nov 28, 2023 10:20 AM Rea BROCKS wrote: Patient/caller voiced complaints of severe back pain til the point that it is hard for him to stand up and walk. Is wondering should he come in for appointment or if something can be called in. Patient directly asked to speak with Marjorie, NP [facility]'s nurse. He didn't want to be transferred to NT. Declined transfer to triage.

## 2023-11-28 NOTE — Progress Notes (Signed)
 Dylan Lane                                          MRN: [id number]   11/28/2023   The [facility] Quality Team Specialist reviewed this patient medical record for the purposes of chart review for care gap closure. The following were reviewed: chart review for care gap closure-kidney health evaluation for diabetes:eGFR  and uACR. Patient needs uACR, appt 12/05/2023    [facility] Quality Team

## 2023-11-28 NOTE — Telephone Encounter (Signed)
Patient is aware.  Appointment scheduled. °

## 2023-12-01 NOTE — Progress Notes (Addendum)
 Dylan Lane                                          MRN: [id number]   12/01/2023   The [facility] Quality Team Specialist reviewed this patient medical record for the purposes of chart review for care gap closure. The following were reviewed: chart review for care gap closure-diabetic eye exam.  01/17/2024- CANNOT CLOSE KED 2025    [facility] Quality Team

## 2023-12-05 ENCOUNTER — Encounter: Payer: Self-pay | Admitting: Adult Health

## 2023-12-05 ENCOUNTER — Ambulatory Visit: Admitting: Adult Health

## 2023-12-05 VITALS — BP 110/68 | HR 70 | Temp 98.4°F | Ht 67.0 in | Wt 187.0 lb

## 2023-12-05 DIAGNOSIS — S39012A Strain of muscle, fascia and tendon of lower back, initial encounter: Secondary | ICD-10-CM | POA: Diagnosis not present

## 2023-12-05 DIAGNOSIS — M25512 Pain in left shoulder: Secondary | ICD-10-CM

## 2023-12-05 MED ORDER — CYCLOBENZAPRINE HCL 10 MG PO TABS: 10.0000 mg | ORAL_TABLET | Freq: Every day | ORAL | 0 refills | Status: AC
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
## 2023-12-06 ENCOUNTER — Ambulatory Visit: Attending: Cardiology | Admitting: Internal Medicine

## 2023-12-06 VITALS — BP 117/76 | HR 67 | Ht 67.0 in | Wt 189.0 lb

## 2023-12-06 DIAGNOSIS — N189 Chronic kidney disease, unspecified: Secondary | ICD-10-CM

## 2023-12-06 DIAGNOSIS — E782 Mixed hyperlipidemia: Secondary | ICD-10-CM

## 2023-12-06 DIAGNOSIS — I10 Essential (primary) hypertension: Secondary | ICD-10-CM | POA: Diagnosis not present

## 2023-12-06 DIAGNOSIS — I25118 Atherosclerotic heart disease of native coronary artery with other forms of angina pectoris: Secondary | ICD-10-CM

## 2023-12-06 DIAGNOSIS — R011 Cardiac murmur, unspecified: Secondary | ICD-10-CM | POA: Diagnosis not present
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
## 2023-12-06 NOTE — Patient Instructions (Addendum)
 Medication Instructions:  Your physician has recommended you make the following change in your medication:  STOP: Aspirin   *If you need a refill on your cardiac medications before your next appointment, please call your pharmacy*   Testing/Procedures: Your physician has requested that you have an echocardiogram. Echocardiography is a painless test that uses sound waves to create images of your heart. It provides your doctor with information about the size and shape of your heart and how well your heart's chambers and valves are working. This procedure takes approximately one hour. There are no restrictions for this procedure. Please do NOT wear cologne, perfume, aftershave, or lotions (deodorant is allowed). Please arrive 15 minutes prior to your appointment time.  Please note: We ask at that you not bring children with you during ultrasound (echo/ vascular) testing. Due to room size and safety concerns, children are not allowed in the ultrasound rooms during exams. Our front office staff cannot provide observation of children in our lobby area while testing is being conducted. An adult accompanying a patient to their appointment will only be allowed in the ultrasound room at the discretion of the ultrasound technician under special circumstances. We apologize for any inconvenience.   Follow-Up: At [facility], you and your health needs are our priority.  As part of our continuing mission to provide you with exceptional heart care, our providers are all part of one team.  This team includes your primary Cardiologist (physician) and Advanced Practice Providers or APPs (Physician Assistants and Nurse Practitioners) who all work together to provide you with the care you need, when you need it.  Your next appointment:   1 year(s)  Provider:   Stanly DELENA Leavens, MD

## 2023-12-11 ENCOUNTER — Other Ambulatory Visit

## 2023-12-18 ENCOUNTER — Other Ambulatory Visit: Payer: Self-pay | Admitting: Internal Medicine

## 2023-12-22 ENCOUNTER — Telehealth: Payer: Self-pay

## 2023-12-22 NOTE — Telephone Encounter (Signed)
 Pt notified that he will need the Flu vaccine and the Covid vaccine and verbalized understanding.

## 2023-12-22 NOTE — Telephone Encounter (Signed)
 Copied from CRM #[id number]. Topic: Clinical - Medical Advice >> Dec 21, 2023  4:52 PM Sasha M wrote: Reason for CRM: Pt calling to inquire about vaccinations he may need. Please call pt to verify what he is up to date on and what he currently is in need of.

## 2024-01-08 ENCOUNTER — Other Ambulatory Visit

## 2024-01-10 ENCOUNTER — Ambulatory Visit: Payer: Self-pay

## 2024-01-10 ENCOUNTER — Telehealth: Payer: Self-pay

## 2024-01-10 ENCOUNTER — Ambulatory Visit: Admitting: Adult Health

## 2024-01-10 NOTE — Telephone Encounter (Signed)
 Spoke to pt and he just wanted to reschedule his appt. Pt has been rescheduled.

## 2024-01-10 NOTE — Telephone Encounter (Signed)
 FYI Only or Action Required?: FYI only for provider: appointment scheduled on 01/11/24.  Patient was last seen in primary care on 12/05/2023 by Merna Huxley, NP.  Called Nurse Triage reporting Back Pain.  Symptoms began 2 weeks ago.  Interventions attempted: OTC medications: Tylenol .  Symptoms are: stable.  Triage Disposition: See PCP When Office is Open (Within 3 Days)  Patient/caregiver understands and will follow disposition?: Yes        Copied from CRM [id number]. Topic: Clinical - Red Word Triage >> Jan 10, 2024  7:53 AM Carlyon D wrote: Red Word that prompted transfer to Nurse Triage: mild/severe Low pain in he back, pt had appt today and overslept Reason for Disposition  [1] MODERATE back pain (e.g., interferes with normal activities) AND [2] present > 3 days  Answer Assessment - Initial Assessment Questions 1. ONSET: When did the pain begin? (e.g., minutes, hours, days)     2 weeks ago.  2. LOCATION: Where does it hurt? (upper, mid or lower back)     Lower.  3. SEVERITY: How bad is the pain?  (e.g., Scale 1-10; mild, moderate, or severe)     Mild  4. PATTERN: Is the pain constant? (e.g., yes, no; constant, intermittent)      Comes and goes.  5. RADIATION: Does the pain shoot into your legs or somewhere else?     Radiates to buttocks and round inner right thigh and knee.  6. CAUSE:  What do you think is causing the back pain?      Unknown. He states he thought it could be the medication (metformin  or cholesterol medication) he is taking causing it.  7. BACK OVERUSE:  Any recent lifting of heavy objects, strenuous work or exercise?     No.  8. MEDICINES: What have you taken so far for the pain? (e.g., nothing, acetaminophen , NSAIDS)     2 Tylenol .  9. NEUROLOGIC SYMPTOMS: Do you have any weakness, numbness, or problems with bowel/bladder control?     Episodes of right leg weakness when getting out of the car, lifting his leg out (temporarily).  No numbness or problems with bowel or bladder control; and patient states he is able to bear weight on both legs.  10. OTHER SYMPTOMS: Do you have any other symptoms? (e.g., fever, abdomen pain, burning with urination, blood in urine)       No LOC, abdominal pain, urinary symptoms, fever.  Protocols used: Back Pain-A-AH

## 2024-01-10 NOTE — Telephone Encounter (Signed)
 1st callback attempt. LVM to call office back.     Copied from CRM [id number]. Topic: Clinical - Red Word Triage >> Jan 10, 2024  7:53 AM Carlyon D wrote: Red Word that prompted transfer to Nurse Triage: mild/severe Low pain in he back, pt had appt today and overslept >> Jan 10, 2024  1:24 PM China J wrote: Patient calling back to change his appointments for his lower back back.

## 2024-01-10 NOTE — Telephone Encounter (Signed)
 Copied from CRM #[id number]. Topic: General - Other >> Jan 10, 2024  3:19 PM Mesmerise C wrote: Reason for CRM: Patient wanting a call back from NP Nafziger's nurse wouldn't give anymore info other than had a question

## 2024-01-10 NOTE — Telephone Encounter (Signed)
 Duplicate; pt states he has already spoken to clinic and straightened everything out.     Copied from CRM [id number]. Topic: Clinical - Red Word Triage >> Jan 10, 2024  7:53 AM Carlyon D wrote: Red Word that prompted transfer to Nurse Triage: mild/severe Low pain in he back, pt had appt today and overslept >> Jan 10, 2024  1:24 PM China J wrote: Patient calling back to change his appointments for his lower back back.

## 2024-01-10 NOTE — Telephone Encounter (Signed)
 Noted

## 2024-01-11 ENCOUNTER — Ambulatory Visit: Admitting: Adult Health

## 2024-01-15 ENCOUNTER — Other Ambulatory Visit

## 2024-01-16 ENCOUNTER — Ambulatory Visit

## 2024-01-16 ENCOUNTER — Ambulatory Visit: Admitting: Adult Health

## 2024-01-16 ENCOUNTER — Encounter: Payer: Self-pay | Admitting: Adult Health

## 2024-01-16 VITALS — BP 120/80 | HR 74 | Temp 98.5°F | Ht 67.0 in | Wt 187.0 lb

## 2024-01-16 DIAGNOSIS — M5441 Lumbago with sciatica, right side: Secondary | ICD-10-CM

## 2024-01-16 DIAGNOSIS — M25512 Pain in left shoulder: Secondary | ICD-10-CM | POA: Diagnosis not present

## 2024-01-16 DIAGNOSIS — G8929 Other chronic pain: Secondary | ICD-10-CM
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
## 2024-01-17 ENCOUNTER — Telehealth: Payer: Self-pay | Admitting: *Deleted

## 2024-01-17 NOTE — Telephone Encounter (Signed)
 Spoke to pt and he is concerned about his partners health. His partner has been scheduled will let provider know the update.

## 2024-01-17 NOTE — Telephone Encounter (Signed)
 Copied from CRM [id number]. Topic: Clinical - Medical Advice >> Jan 17, 2024 11:12 AM Gustabo D wrote: Pt is wanting a call from his pcp nurse

## 2024-01-22 ENCOUNTER — Telehealth: Payer: Self-pay

## 2024-01-22 ENCOUNTER — Ambulatory Visit (HOSPITAL_COMMUNITY)
Admission: RE | Admit: 2024-01-22 | Discharge: 2024-01-22 | Disposition: A | Discharge status: Home / Self Care | Payer: Self-pay | Source: Ambulatory Visit | Attending: Internal Medicine | Admitting: Internal Medicine

## 2024-01-22 DIAGNOSIS — R011 Cardiac murmur, unspecified: Secondary | ICD-10-CM | POA: Insufficient documentation

## 2024-01-22 LAB — ECHOCARDIOGRAM COMPLETE
Area-P 1/2: 3.6 cm2
S' Lateral: 2.8 cm

## 2024-01-22 NOTE — Telephone Encounter (Signed)
 Copied from CRM [id number]. Topic: Clinical - Medical Advice >> Jan 22, 2024  7:42 AM Mercedes MATSU wrote: Reason for CRM: Patient states that she did not take his medication last night, but he instead took it this morning which was atorvastatin  (LIPITOR ) 80 MG tablet, metformin , and his acid reflux medication. He wanted to know if it was okay, or if there would be any contradistinctions with his morning medication.. Patient is requesting a call back [phone].

## 2024-01-23 ENCOUNTER — Ambulatory Visit: Payer: Self-pay | Admitting: Adult Health

## 2024-01-23 ENCOUNTER — Telehealth: Payer: Self-pay

## 2024-01-23 DIAGNOSIS — G8929 Other chronic pain: Secondary | ICD-10-CM

## 2024-01-23 DIAGNOSIS — S39012A Strain of muscle, fascia and tendon of lower back, initial encounter: Secondary | ICD-10-CM

## 2024-01-23 NOTE — Telephone Encounter (Signed)
 Copied from CRM [id number]. Topic: Clinical - Medical Advice >> Jan 22, 2024  7:42 AM Dylan Lane wrote: Reason for CRM: Patient states that she did not take his medication last night, but he instead took it this morning which was atorvastatin  (LIPITOR ) 80 MG tablet, metformin , and his acid reflux medication. He wanted to know if it was okay, or if there would be any contradistinctions with his morning medication.. Patient is requesting a call back [phone]. >> Jan 22, 2024  8:45 AM Dylan Lane wrote: Patient following up on message sent this morning about direction with taking medication. States he needs to take medication.   Dylan Lane  [phone]

## 2024-01-23 NOTE — Telephone Encounter (Signed)
Please see other phone note for follow up.

## 2024-01-23 NOTE — Telephone Encounter (Signed)
 Patient notified of update  and verbalized understanding. Also, informed pt of lab results.

## 2024-01-23 NOTE — Telephone Encounter (Signed)
 Noted.

## 2024-01-23 NOTE — Telephone Encounter (Signed)
 Please advise.

## 2024-01-24 ENCOUNTER — Telehealth: Payer: Self-pay

## 2024-01-24 ENCOUNTER — Ambulatory Visit: Payer: Self-pay

## 2024-01-24 NOTE — Telephone Encounter (Signed)
 Copied from CRM [id number]. Topic: Clinical - Medical Advice >> Jan 22, 2024  7:42 AM Dylan Lane wrote: Reason for CRM: Patient states that she did not take his medication last night, but he instead took it this morning which was atorvastatin  (LIPITOR ) 80 MG tablet, metformin , and his acid reflux medication. He wanted to know if it was okay, or if there would be any contradistinctions with his morning medication.. Patient is requesting a call back [phone]. >> Jan 23, 2024  8:10 AM Dylan Lane wrote: Pls call patient back.. still has questions.. needs clarity on his xray as well..  >> Jan 22, 2024  8:45 AM Dylan Lane wrote: Patient following up on message sent this morning about direction with taking medication. States he needs to take medication.   Dylan Lane  [phone]     Please see other note

## 2024-01-24 NOTE — Telephone Encounter (Signed)
Pt returning call for results. Please advise.  

## 2024-01-24 NOTE — Telephone Encounter (Signed)
 Spoke to patient echo results given.Stated he does not use mychart.Stated he feels good.Advised to call if he develops any symptoms.

## 2024-01-24 NOTE — Telephone Encounter (Signed)
Patient called to follow-up on test results. 

## 2024-02-12 ENCOUNTER — Ambulatory Visit
